# Patient Record
Sex: Female | Born: 1935 | Race: White | Hispanic: No | Smoking: Current every day smoker
Health system: Southern US, Community
[De-identification: ages and names within clinical notes are randomized; demographics above are authoritative.]

## PROBLEM LIST (undated history)

## (undated) DIAGNOSIS — D649 Anemia, unspecified: Secondary | ICD-10-CM

## (undated) DIAGNOSIS — Z951 Presence of aortocoronary bypass graft: Secondary | ICD-10-CM

## (undated) DIAGNOSIS — I714 Abdominal aortic aneurysm, without rupture, unspecified: Secondary | ICD-10-CM

## (undated) DIAGNOSIS — N186 End stage renal disease: Secondary | ICD-10-CM

## (undated) DIAGNOSIS — I639 Cerebral infarction, unspecified: Secondary | ICD-10-CM

## (undated) DIAGNOSIS — Z950 Presence of cardiac pacemaker: Secondary | ICD-10-CM

## (undated) DIAGNOSIS — E039 Hypothyroidism, unspecified: Secondary | ICD-10-CM

## (undated) DIAGNOSIS — J449 Chronic obstructive pulmonary disease, unspecified: Secondary | ICD-10-CM

## (undated) DIAGNOSIS — E785 Hyperlipidemia, unspecified: Secondary | ICD-10-CM

## (undated) DIAGNOSIS — Z992 Dependence on renal dialysis: Secondary | ICD-10-CM

## (undated) DIAGNOSIS — F329 Major depressive disorder, single episode, unspecified: Secondary | ICD-10-CM

## (undated) DIAGNOSIS — J189 Pneumonia, unspecified organism: Secondary | ICD-10-CM

## (undated) DIAGNOSIS — I4891 Unspecified atrial fibrillation: Secondary | ICD-10-CM

## (undated) DIAGNOSIS — F32A Depression, unspecified: Secondary | ICD-10-CM

## (undated) DIAGNOSIS — I509 Heart failure, unspecified: Secondary | ICD-10-CM

## (undated) DIAGNOSIS — I442 Atrioventricular block, complete: Secondary | ICD-10-CM

## (undated) DIAGNOSIS — I739 Peripheral vascular disease, unspecified: Secondary | ICD-10-CM

## (undated) DIAGNOSIS — I6529 Occlusion and stenosis of unspecified carotid artery: Secondary | ICD-10-CM

## (undated) DIAGNOSIS — I10 Essential (primary) hypertension: Secondary | ICD-10-CM

## (undated) HISTORY — DX: Essential (primary) hypertension: I10

## (undated) HISTORY — DX: Heart failure, unspecified: I50.9

## (undated) HISTORY — PX: CARDIAC CATHETERIZATION: SHX172

## (undated) HISTORY — DX: Peripheral vascular disease, unspecified: I73.9

## (undated) HISTORY — DX: Hyperlipidemia, unspecified: E78.5

## (undated) HISTORY — DX: Occlusion and stenosis of unspecified carotid artery: I65.29

---

## 1969-06-03 HISTORY — PX: HEMORRHOID SURGERY: SHX153

## 1974-06-03 HISTORY — PX: VAGINAL HYSTERECTOMY: SUR661

## 1989-02-01 HISTORY — PX: INSERT / REPLACE / REMOVE PACEMAKER: SUR710

## 1999-09-02 HISTORY — PX: CORONARY ANGIOPLASTY WITH STENT PLACEMENT: SHX49

## 1999-09-02 HISTORY — PX: PSEUDOANEURYSM REPAIR: SHX2272

## 1999-09-05 ENCOUNTER — Encounter: Payer: Self-pay | Admitting: Cardiology

## 1999-09-05 ENCOUNTER — Inpatient Hospital Stay (HOSPITAL_COMMUNITY): Admission: AD | Admit: 1999-09-05 | Discharge: 1999-09-13 | Payer: Self-pay | Admitting: Cardiology

## 1999-11-10 ENCOUNTER — Inpatient Hospital Stay (HOSPITAL_COMMUNITY): Admission: EM | Admit: 1999-11-10 | Discharge: 1999-11-12 | Payer: Self-pay | Admitting: Cardiology

## 1999-11-11 ENCOUNTER — Encounter: Payer: Self-pay | Admitting: Cardiology

## 2000-03-03 HISTORY — PX: DG AV DIALYSIS  SHUNT ACCESS EXIST*L* OR: HXRAD910

## 2000-03-20 ENCOUNTER — Inpatient Hospital Stay (HOSPITAL_COMMUNITY): Admission: AD | Admit: 2000-03-20 | Discharge: 2000-03-22 | Payer: Self-pay | Admitting: Nephrology

## 2000-03-21 ENCOUNTER — Encounter: Payer: Self-pay | Admitting: Nephrology

## 2000-03-27 ENCOUNTER — Encounter: Payer: Self-pay | Admitting: Vascular Surgery

## 2000-03-27 ENCOUNTER — Ambulatory Visit (HOSPITAL_COMMUNITY): Admission: RE | Admit: 2000-03-27 | Discharge: 2000-03-27 | Payer: Self-pay | Admitting: Vascular Surgery

## 2000-04-07 ENCOUNTER — Encounter: Payer: Self-pay | Admitting: Nephrology

## 2000-04-07 ENCOUNTER — Ambulatory Visit (HOSPITAL_COMMUNITY): Admission: RE | Admit: 2000-04-07 | Discharge: 2000-04-07 | Payer: Self-pay | Admitting: Nephrology

## 2002-06-03 HISTORY — PX: THYROID SURGERY: SHX805

## 2004-06-20 ENCOUNTER — Ambulatory Visit (HOSPITAL_COMMUNITY): Admission: RE | Admit: 2004-06-20 | Discharge: 2004-06-20 | Payer: Self-pay | Admitting: Nephrology

## 2005-06-03 DIAGNOSIS — I639 Cerebral infarction, unspecified: Secondary | ICD-10-CM

## 2005-06-03 HISTORY — DX: Cerebral infarction, unspecified: I63.9

## 2007-02-02 HISTORY — PX: CORONARY ARTERY BYPASS GRAFT: SHX141

## 2007-02-11 ENCOUNTER — Ambulatory Visit: Payer: Self-pay | Admitting: Cardiothoracic Surgery

## 2007-03-17 ENCOUNTER — Ambulatory Visit: Payer: Self-pay | Admitting: Cardiology

## 2007-03-17 ENCOUNTER — Inpatient Hospital Stay (HOSPITAL_COMMUNITY): Admission: AD | Admit: 2007-03-17 | Discharge: 2007-03-21 | Payer: Self-pay | Admitting: Nephrology

## 2007-03-18 ENCOUNTER — Encounter: Payer: Self-pay | Admitting: Gastroenterology

## 2007-03-24 ENCOUNTER — Ambulatory Visit: Payer: Self-pay | Admitting: Gastroenterology

## 2007-04-04 HISTORY — PX: AV FISTULA REPAIR: SHX563

## 2007-04-06 ENCOUNTER — Ambulatory Visit: Payer: Self-pay | Admitting: Thoracic Surgery (Cardiothoracic Vascular Surgery)

## 2007-04-11 ENCOUNTER — Ambulatory Visit: Payer: Self-pay | Admitting: Cardiology

## 2007-04-11 ENCOUNTER — Ambulatory Visit: Payer: Self-pay | Admitting: Vascular Surgery

## 2007-04-11 ENCOUNTER — Inpatient Hospital Stay (HOSPITAL_COMMUNITY): Admission: EM | Admit: 2007-04-11 | Discharge: 2007-04-16 | Payer: Self-pay | Admitting: Cardiology

## 2007-04-13 ENCOUNTER — Encounter: Payer: Self-pay | Admitting: Internal Medicine

## 2007-04-14 ENCOUNTER — Ambulatory Visit: Payer: Self-pay | Admitting: Vascular Surgery

## 2007-04-28 ENCOUNTER — Emergency Department (HOSPITAL_COMMUNITY): Admission: EM | Admit: 2007-04-28 | Discharge: 2007-04-29 | Payer: Self-pay | Admitting: Emergency Medicine

## 2007-05-06 ENCOUNTER — Ambulatory Visit: Payer: Self-pay | Admitting: Vascular Surgery

## 2007-05-11 ENCOUNTER — Ambulatory Visit: Payer: Self-pay | Admitting: Cardiology

## 2007-05-11 ENCOUNTER — Inpatient Hospital Stay (HOSPITAL_COMMUNITY): Admission: EM | Admit: 2007-05-11 | Discharge: 2007-05-13 | Payer: Self-pay | Admitting: Emergency Medicine

## 2008-07-21 ENCOUNTER — Inpatient Hospital Stay (HOSPITAL_COMMUNITY): Admission: EM | Admit: 2008-07-21 | Discharge: 2008-07-22 | Payer: Self-pay | Admitting: Cardiovascular Disease

## 2008-07-21 ENCOUNTER — Ambulatory Visit: Payer: Self-pay | Admitting: Cardiovascular Disease

## 2008-11-23 ENCOUNTER — Encounter (INDEPENDENT_AMBULATORY_CARE_PROVIDER_SITE_OTHER): Payer: Self-pay | Admitting: *Deleted

## 2009-02-21 ENCOUNTER — Encounter (INDEPENDENT_AMBULATORY_CARE_PROVIDER_SITE_OTHER): Payer: Self-pay | Admitting: *Deleted

## 2009-06-03 HISTORY — PX: OTHER SURGICAL HISTORY: SHX169

## 2009-06-06 ENCOUNTER — Ambulatory Visit: Payer: Self-pay | Admitting: Vascular Surgery

## 2009-06-27 ENCOUNTER — Ambulatory Visit: Payer: Self-pay | Admitting: Surgery

## 2009-06-27 ENCOUNTER — Inpatient Hospital Stay (HOSPITAL_COMMUNITY): Admission: AD | Admit: 2009-06-27 | Discharge: 2009-06-30 | Payer: Self-pay | Admitting: Surgery

## 2009-07-28 ENCOUNTER — Ambulatory Visit: Payer: Self-pay | Admitting: Vascular Surgery

## 2009-11-28 ENCOUNTER — Telehealth: Payer: Self-pay | Admitting: Cardiovascular Disease

## 2010-04-11 ENCOUNTER — Ambulatory Visit: Payer: Self-pay | Admitting: Surgery

## 2010-06-24 ENCOUNTER — Encounter: Payer: Self-pay | Admitting: Surgery

## 2010-07-03 NOTE — Progress Notes (Signed)
Summary: pt is following Dr. Norman Herrlich  Phone Note Outgoing Call Call back at Home Phone [phone]   Summary of Call: CMA s/w pt to ask if she was going to follow seeing Dr. Clifton James or was she going to follow Dr. Norman Herrlich who is listed as her Primary Cardiologist..She states she is going to follow w/ Dr. Dulce Sellar. I then advised that her meds would need to be filled w/Dr. Dulce Sellar from now on. Pt gave verbal understanding. I advised that I would call pharmacy and make aware of all meds to be sent to Dr. Dulce Sellar from now on and not Dr. Clifton James. Danielle Rankin, CMA  November 28, 2009 12:06 PM

## 2010-07-12 ENCOUNTER — Ambulatory Visit (INDEPENDENT_AMBULATORY_CARE_PROVIDER_SITE_OTHER): Payer: Medicare Other | Admitting: Vascular Surgery

## 2010-07-12 DIAGNOSIS — N186 End stage renal disease: Secondary | ICD-10-CM

## 2010-07-13 NOTE — Assessment & Plan Note (Signed)
OFFICE VISIT  Maria, LECKEY [name] DOB:  December 10, 1932                                       07/12/2010 ZOXWR#:[id number]  CHIEF COMPLAINT:  Numbness in arm.  HISTORY OF PRESENT ILLNESS:  The patient is a 76 year old female referred by Dr. Hyman Hopes for evaluation for possible ischemic steal from her AV fistula.  She had a left upper arm AV fistula placed in November of 2001.  This was then revised in 2008 and was revised by replacing an aneurysmal segment with PTFE.  She apparently had some numbness and tingling in the upper arm around the area of the fistula recently.  This has since resolved.  She denies any numbness or tingling in her left hand.  She has had no ulceration or skin loss in her left hand.  She states that the symptoms at this point have essentially resolved and were primarily upper arm in character.  Of note, she had not received the results of ABIs performed in our office in November of 2011.  This was done for followup of bilateral common iliac stents performed by Dr. Myra Gianotti in January of 2011.  I reviewed those ABI findings with her today which were triphasic bilaterally with an ABI of greater than 1 bilaterally.  PHYSICAL EXAM:  Today blood pressure is 137/73 in the right arm, heart rate is 82 and regular, respirations 20.  She has an easily palpable thrill in the left upper arm AV fistula.  She has a 1+ left radial pulse.  Chest:  Clear to auscultation.  Cardiac:  Regular rate and rhythm.  Abdomen:  Soft with a tender area in the epigastrium with a small nodule in this location.  This is at an old port site which she thinks was from previous cholecystectomy.  The mass is nonreducible.  REVIEW OF SYSTEMS:  Today she denies any shortness of breath.  She denies any chest pain.  ASSESSMENT:  Left arm pain of unknown etiology but doubt ischemic steal. She does not really have distal symptoms in her hand and her symptoms currently have resolved.  I  would favor conservative management with continued observation.  If her symptoms worsen again over time we would be happy to reevaluate at some point in the future.  I discussed all the signs and symptoms of ischemic steal with the patient today so she knows what to look for.  As well as her iliac stents are concerned these seem to be patent by clinical exam as well as recent ABIs.  She will continue to follow up with Dr. Myra Gianotti if she has any problems concerning claudication in the future.  She has a nodule on her upper abdomen which I believe probably is consistent with an incarcerated hernia from previous laparoscopic cholecystectomy.  I have discussed with her that if this continues to cause her problems over time she could see one of the general surgeons for further evaluation of this.  She will follow up with me on an as- needed basis.    Janetta Hora. Fields, MD Electronically Signed  CEF/MEDQ  D:  07/13/2010  T:  07/13/2010  Job:  [id number]  cc:   Garnetta Buddy, [name].D.

## 2010-08-09 ENCOUNTER — Institutional Professional Consult (permissible substitution) (INDEPENDENT_AMBULATORY_CARE_PROVIDER_SITE_OTHER): Payer: Medicare Other | Admitting: Cardiovascular Disease

## 2010-08-09 DIAGNOSIS — E7800 Pure hypercholesterolemia, unspecified: Secondary | ICD-10-CM

## 2010-08-09 DIAGNOSIS — I4891 Unspecified atrial fibrillation: Secondary | ICD-10-CM

## 2010-08-09 DIAGNOSIS — I5022 Chronic systolic (congestive) heart failure: Secondary | ICD-10-CM

## 2010-08-09 DIAGNOSIS — I2510 Atherosclerotic heart disease of native coronary artery without angina pectoris: Secondary | ICD-10-CM

## 2010-08-14 ENCOUNTER — Telehealth: Payer: Self-pay | Admitting: Cardiovascular Disease

## 2010-08-19 LAB — BASIC METABOLIC PANEL
BUN: 20 mg/dL (ref 6–23)
BUN: 37 mg/dL — ABNORMAL HIGH (ref 6–23)
CO2: 25 mEq/L (ref 19–32)
GFR calc Af Amer: 10 mL/min — ABNORMAL LOW (ref >60)
GFR calc Af Amer: 7 mL/min — ABNORMAL LOW (ref >60)
GFR calc non Af Amer: 6 mL/min — ABNORMAL LOW (ref >60)
Glucose, Bld: 92 mg/dL (ref 70–99)
Glucose, Bld: 98 mg/dL (ref 70–99)
Potassium: 4 mEq/L (ref 3.5–5.1)

## 2010-08-19 LAB — CBC
HCT: 25.4 % — ABNORMAL LOW (ref 36.0–46.0)
HCT: 29 % — ABNORMAL LOW (ref 36.0–46.0)
HCT: 29.6 % — ABNORMAL LOW (ref 36.0–46.0)
HCT: 30.1 % — ABNORMAL LOW (ref 36.0–46.0)
Hemoglobin: 10 g/dL — ABNORMAL LOW (ref 12.0–15.0)
Hemoglobin: 8.6 g/dL — ABNORMAL LOW (ref 12.0–15.0)
Hemoglobin: 9.7 g/dL — ABNORMAL LOW (ref 12.0–15.0)
MCHC: 32.9 g/dL (ref 30.0–36.0)
MCHC: 33.2 g/dL (ref 30.0–36.0)
MCHC: 33.3 g/dL (ref 30.0–36.0)
MCHC: 34 g/dL (ref 30.0–36.0)
MCV: 110 fL — ABNORMAL HIGH (ref 78.0–100.0)
MCV: 111.3 fL — ABNORMAL HIGH (ref 78.0–100.0)
Platelets: 142 10*3/uL — ABNORMAL LOW (ref 150–400)
Platelets: 174 10*3/uL (ref 150–400)
RBC: 2.24 MIL/uL — ABNORMAL LOW (ref 3.87–5.11)
RBC: 2.28 MIL/uL — ABNORMAL LOW (ref 3.87–5.11)
RDW: 16.8 % — ABNORMAL HIGH (ref 11.5–15.5)
RDW: 17 % — ABNORMAL HIGH (ref 11.5–15.5)
RDW: 17 % — ABNORMAL HIGH (ref 11.5–15.5)
RDW: 17.2 % — ABNORMAL HIGH (ref 11.5–15.5)
WBC: 5.1 10*3/uL (ref 4.0–10.5)
WBC: 5.2 10*3/uL (ref 4.0–10.5)
WBC: 5.4 10*3/uL (ref 4.0–10.5)
WBC: 8.8 10*3/uL (ref 4.0–10.5)

## 2010-08-19 LAB — POCT I-STAT, CHEM 8
Calcium, Ion: 1.22 mmol/L (ref 1.12–1.32)
Chloride: 105 mEq/L (ref 96–112)
Creatinine, Ser: 5 mg/dL — ABNORMAL HIGH (ref 0.4–1.2)
Creatinine, Ser: 5.3 mg/dL — ABNORMAL HIGH (ref 0.4–1.2)
Hemoglobin: 12.2 g/dL (ref 12.0–15.0)
TCO2: 29 mmol/L (ref 0–100)

## 2010-08-19 LAB — TYPE AND SCREEN: ABO/RH(D): O POS

## 2010-08-19 LAB — DIFFERENTIAL
Basophils Absolute: 0 10*3/uL (ref 0.0–0.1)
Basophils Relative: 0 % (ref 0–1)
Eosinophils Absolute: 0.1 10*3/uL (ref 0.0–0.7)
Eosinophils Relative: 0 % (ref 0–5)
Eosinophils Relative: 2 % (ref 0–5)
Lymphocytes Relative: 16 % (ref 12–46)
Lymphs Abs: 0.8 10*3/uL (ref 0.7–4.0)
Lymphs Abs: 1.3 10*3/uL (ref 0.7–4.0)
Monocytes Absolute: 0.2 10*3/uL (ref 0.1–1.0)
Neutro Abs: 4.1 10*3/uL (ref 1.7–7.7)
Neutro Abs: 4.9 10*3/uL (ref 1.7–7.7)
Neutrophils Relative %: 68 % (ref 43–77)

## 2010-08-19 LAB — RENAL FUNCTION PANEL
Calcium: 9.7 mg/dL (ref 8.4–10.5)
Chloride: 97 mEq/L (ref 96–112)
GFR calc Af Amer: 8 mL/min — ABNORMAL LOW (ref >60)
GFR calc non Af Amer: 6 mL/min — ABNORMAL LOW (ref >60)
Phosphorus: 5.7 mg/dL — ABNORMAL HIGH (ref 2.3–4.6)
Potassium: 5 mEq/L (ref 3.5–5.1)

## 2010-08-19 LAB — HEPATITIS B SURFACE ANTIGEN: Hepatitis B Surface Ag: NEGATIVE

## 2010-08-22 ENCOUNTER — Inpatient Hospital Stay (HOSPITAL_BASED_OUTPATIENT_CLINIC_OR_DEPARTMENT_OTHER)
Admission: RE | Admit: 2010-08-22 | Discharge: 2010-08-22 | Disposition: A | Discharge status: Hospice | Payer: Medicare Other | Source: Ambulatory Visit | Attending: Cardiovascular Disease | Admitting: Cardiovascular Disease

## 2010-08-22 ENCOUNTER — Observation Stay (HOSPITAL_COMMUNITY)
Admission: AD | Admit: 2010-08-22 | Discharge: 2010-08-23 | Disposition: A | Discharge status: Home / Self Care | Payer: Medicare Other | Source: Ambulatory Visit | Attending: Cardiovascular Disease | Admitting: Cardiovascular Disease

## 2010-08-22 DIAGNOSIS — J449 Chronic obstructive pulmonary disease, unspecified: Secondary | ICD-10-CM | POA: Insufficient documentation

## 2010-08-22 DIAGNOSIS — I2589 Other forms of chronic ischemic heart disease: Secondary | ICD-10-CM | POA: Insufficient documentation

## 2010-08-22 DIAGNOSIS — I120 Hypertensive chronic kidney disease with stage 5 chronic kidney disease or end stage renal disease: Secondary | ICD-10-CM | POA: Insufficient documentation

## 2010-08-22 DIAGNOSIS — N186 End stage renal disease: Secondary | ICD-10-CM | POA: Insufficient documentation

## 2010-08-22 DIAGNOSIS — Z992 Dependence on renal dialysis: Secondary | ICD-10-CM | POA: Insufficient documentation

## 2010-08-22 DIAGNOSIS — I509 Heart failure, unspecified: Secondary | ICD-10-CM | POA: Insufficient documentation

## 2010-08-22 DIAGNOSIS — I2510 Atherosclerotic heart disease of native coronary artery without angina pectoris: Secondary | ICD-10-CM | POA: Insufficient documentation

## 2010-08-22 DIAGNOSIS — E785 Hyperlipidemia, unspecified: Secondary | ICD-10-CM | POA: Insufficient documentation

## 2010-08-22 DIAGNOSIS — R0609 Other forms of dyspnea: Secondary | ICD-10-CM | POA: Insufficient documentation

## 2010-08-22 DIAGNOSIS — R220 Localized swelling, mass and lump, head: Principal | ICD-10-CM | POA: Insufficient documentation

## 2010-08-22 DIAGNOSIS — Y9210 Unspecified residential institution as the place of occurrence of the external cause: Secondary | ICD-10-CM | POA: Insufficient documentation

## 2010-08-22 DIAGNOSIS — J4489 Other specified chronic obstructive pulmonary disease: Secondary | ICD-10-CM | POA: Insufficient documentation

## 2010-08-22 DIAGNOSIS — R0989 Other specified symptoms and signs involving the circulatory and respiratory systems: Secondary | ICD-10-CM | POA: Insufficient documentation

## 2010-08-22 DIAGNOSIS — Z951 Presence of aortocoronary bypass graft: Secondary | ICD-10-CM | POA: Insufficient documentation

## 2010-08-22 DIAGNOSIS — I059 Rheumatic mitral valve disease, unspecified: Secondary | ICD-10-CM | POA: Insufficient documentation

## 2010-08-22 DIAGNOSIS — I5020 Unspecified systolic (congestive) heart failure: Secondary | ICD-10-CM | POA: Insufficient documentation

## 2010-08-22 DIAGNOSIS — T50995A Adverse effect of other drugs, medicaments and biological substances, initial encounter: Secondary | ICD-10-CM | POA: Insufficient documentation

## 2010-08-22 DIAGNOSIS — Z950 Presence of cardiac pacemaker: Secondary | ICD-10-CM | POA: Insufficient documentation

## 2010-08-22 DIAGNOSIS — R229 Localized swelling, mass and lump, unspecified: Secondary | ICD-10-CM | POA: Insufficient documentation

## 2010-08-22 DIAGNOSIS — I739 Peripheral vascular disease, unspecified: Secondary | ICD-10-CM | POA: Insufficient documentation

## 2010-08-22 LAB — COMPREHENSIVE METABOLIC PANEL
ALT: 22 U/L (ref 0–35)
Albumin: 3.4 g/dL — ABNORMAL LOW (ref 3.5–5.2)
Alkaline Phosphatase: 47 U/L (ref 39–117)
BUN: 37 mg/dL — ABNORMAL HIGH (ref 6–23)
Chloride: 98 mEq/L (ref 96–112)
Potassium: 4.7 mEq/L (ref 3.5–5.1)
Sodium: 132 mEq/L — ABNORMAL LOW (ref 135–145)
Total Bilirubin: 0.4 mg/dL (ref 0.3–1.2)
Total Protein: 5.8 g/dL — ABNORMAL LOW (ref 6.0–8.3)

## 2010-08-22 LAB — POCT I-STAT 3, VENOUS BLOOD GAS (G3P V)
Bicarbonate: 21.4 mEq/L (ref 20.0–24.0)
O2 Saturation: 55 %
TCO2: 23 mmol/L (ref 0–100)
pH, Ven: 7.335 — ABNORMAL HIGH (ref 7.250–7.300)
pO2, Ven: 31 mmHg (ref 30.0–45.0)

## 2010-08-22 LAB — POCT I-STAT 3, ART BLOOD GAS (G3+)
Acid-base deficit: 1 mmol/L (ref 0.0–2.0)
Acid-base deficit: 1 mmol/L (ref 0.0–2.0)
O2 Saturation: 81 %
O2 Saturation: 83 %
O2 Saturation: 83 %
TCO2: 25 mmol/L (ref 0–100)
TCO2: 25 mmol/L (ref 0–100)
pCO2 arterial: 38.5 mmHg (ref 35.0–45.0)
pH, Arterial: 7.401 — ABNORMAL HIGH (ref 7.350–7.400)

## 2010-08-22 LAB — CBC
HCT: 30.3 % — ABNORMAL LOW (ref 36.0–46.0)
MCV: 105.6 fL — ABNORMAL HIGH (ref 78.0–100.0)
Platelets: 131 10*3/uL — ABNORMAL LOW (ref 150–400)
RBC: 2.87 MIL/uL — ABNORMAL LOW (ref 3.87–5.11)
WBC: 7.5 10*3/uL (ref 4.0–10.5)

## 2010-08-23 ENCOUNTER — Observation Stay (HOSPITAL_COMMUNITY): Payer: Medicare Other

## 2010-08-23 DIAGNOSIS — I2510 Atherosclerotic heart disease of native coronary artery without angina pectoris: Secondary | ICD-10-CM

## 2010-08-24 NOTE — Letter (Signed)
August 09, 2010   Donnel Saxon [address] [place] [address]  RE:  [name], [name] MRN:  [id number]  /  DOB:  02/22/1933  Dear Dr. Ardelle Park:  Thank you for referring [name] for further cardiac evaluation and consideration of cardiac catheterization.  As you are aware, this is a pleasant 77 year old with extensive medical problems that include the following: 1. Coronary artery disease status post myocardial infarction.  She is     status post three-vessel coronary artery bypass graft surgery in     2008.  Most recent cardiac catheterization was done in February     2010 at [facility].  It showed severe underlying three-     vessel coronary artery disease with patent SVG to LAD, SVG to PDA     and SVG to OM.  Ejection fraction was 45%. 2. Congestive heart failure.  Previous ejection fraction was 45%.     However, most recent ejection fraction was 25% by echocardiogram. 3. End-stage renal disease on hemodialysis. 4. Previous history of stroke. 5. Hypertension. 6. Hyperlipidemia. 7. Bilateral carotid artery disease. 8. Chronic obstructive pulmonary disease. 9. Peripheral arterial disease status post iliac stenting done by Dr.     Arbie Cookey. 10.Questionable history of atrial fibrillation. 11.Status post permanent pacemaker placement.  HISTORY OF PRESENT ILLNESS:  [name] is referred today due to abnormal echocardiogram which showed an ejection fraction of 25%.  Her previous ejection fraction was 45%.  She is complaining of progressive dyspnea with minimal activities as well as having no energy.  She feels fatigued all the time and she is not able to perform many of her regular activities.  She also complains of lower extremity weakness with walking.  According to her this was checked at the vascular clinic with recent ABI and was told that circulation was reasonable.  She denies any chest pain.  There have been no palpitations, syncope or presyncope. The patient  usually follows up with Dr. Dulce Sellar but did not want to have the catheterization done at [facility] and thus she preferred to come and see [place] here in clinic.  MEDICATIONS: 1. Aspirin 81 mg once daily. 2. Colace 100 mg twice daily. 3. Lasix 20 mg once daily. 4. Levothyroxine 150 mcg once daily. 5. Lipitor 80 mg at bedtime. 6. Nephron vitamin once daily. 7. Omeprazole 20 mg once daily. 8. Sensipar 30 mg once daily. 9. Plavix 75 mg once daily.  ALLERGIES:  Include IV dye with unknown reaction.  SOCIAL HISTORY:  She quit smoking in 2008.  She used to smoke half a pack per day for many years.  She denies any alcohol or recreational drug use.  The patient is widowed and has 4 children.  PAST SURGICAL HISTORY: 1. Coronary artery bypass graft surgery. 2. Thyroid surgery. 3. Hysterectomy. 4. Hemorrhoid surgery. 5. Dialysis shunt in the left arm. 6. Peripheral angioplasty seems to be to the iliac arteries.  FAMILY HISTORY:  Negative for premature coronary artery disease.  REVIEW OF SYSTEMS:  This is remarkable for increased dyspnea and generalized fatigue.  There is no chest pain.  She does have occasional heartburn.  There is anxiety and depression.  A full review of system was performed and is otherwise negative.  PHYSICAL EXAMINATION:  GENERAL:  The patient appears to be at her stated age in no acute distress. VITAL SIGNS:  Weight is 120.8 pounds, blood pressure is 135/73, pulse is 69, oxygen saturation is 95% on room air. HEENT:  Normocephalic, atraumatic. NECK :  There is no JVD.  There is bruit on the left carotid side but this seems to be transmitted from her left arm fistula. RESPIRATORY:  Normal respiratory effort with no use of accessory muscles.  Auscultation reveals normal breath sounds. Cardiovascular:  PMI is laterally displaced.  Normal S1, S2 with no gallops.  There is a 3/6 systolic ejection murmur at the aortic area which is mid  peaking. ABDOMEN:  Benign, nontender, nondistended. EXTREMITIES:  With no clubbing or cyanosis.  There is trace edema bilaterally. SKIN:  Warm and dry with no rash. PSYCHIATRIC:  She is alert and oriented x3 with normal mood and affect. VASCULAR:  Her femoral pulses are normal bilaterally.  LABORATORY DATA:  An electrocardiogram was performed which showed a ventricular paced rhythm.  I do not see any atrial activity and likely she is in atrial fibrillation. Recent echo report was reviewed. EF 25%, mild MR /TR. Mild aortic stenosis.  IMPRESSION: 1. Congestive heart failure with severely reduced LV systolic     function.  Seems to be chronic.  However, there has been a     significant drop in her ejection fraction.  Her symptoms include     dyspnea with minimal activities as well as generalized fatigue and     lower extremity weakness.  I think it is important to investigate     the reason behind deterioration in her ejection fraction.  The most     common would be progression of ischemic heart disease.  Thus, I     recommend proceeding with cardiac catheterization and possible     coronary intervention for further evaluation.  We will also perform     a right heart catheterization at the same time.  Risks, benefits     and alternatives were discussed with the patient.  We will give her     prednisone for dye allergy.  We will also go ahead and start her on     small dose carvedilol 3.125 mg twice daily as well as lisinopril     2.5 mg once daily.  All side effects were explained.  The other     possibility could be progressive deterioration in ejection fraction     due to continuously being paced in the right ventricle.  We will     have to consider in the future upgraded to biventricular pacemaker     with the possible addition of a defibrillator.  However, I would be     hesitant to go that route given that she is on dialysis and likely     her life expectancy might not warrant  this aggressive approach.  We     will have to see how she responds to medical therapy. 2. Coronary artery disease:  Will be evaluated by cardiac     catheterization.  In the meantime we will continue with daily     aspirin and Lipitor.  We will start treatment for heart failure. 3. Possible atrial fibrillation:  The patient is on aspirin and     Plavix.  She was told in the past that she should not be on     Coumadin.  I am not sure the reason behind that.  It seems that she     was on warfarin in the past and was switched later to Plavix.  Thank you for allowing me to participate in the care of your patient.  Sincerely yours,   Sincerely,  Lorine Bears, MD Electronically Signed   MA/MedQ  DD: 08/09/2010  DT: 08/09/2010  Job #: [id number]

## 2010-08-27 ENCOUNTER — Encounter: Payer: Self-pay | Admitting: Cardiovascular Disease

## 2010-08-30 NOTE — Progress Notes (Signed)
Summary: c/o side effect from meds/f/u  Phone Note Call from Patient Call back at Home Phone [phone]   Caller: Patient Reason for Call: Talk to Nurse Summary of Call: c/o side effect from meds. dry mouth. throat.  Initial call taken by: Lorne Skeens,  August 14, 2010 9:45 AM  Follow-up for Phone Call        Spoke to patient-c/o severe dry mouth with Lisinopril.  Can not tolerate it.  Advised to stop medicine and will get back with patient after Dr. Kirke Corin returns next week. Follow-up by: Dessie Coma  LPN,  August 14, 2010 10:19 AM

## 2010-09-03 ENCOUNTER — Encounter: Payer: Self-pay | Admitting: Cardiovascular Disease

## 2010-09-03 ENCOUNTER — Ambulatory Visit (INDEPENDENT_AMBULATORY_CARE_PROVIDER_SITE_OTHER): Payer: Medicare Other | Admitting: Cardiovascular Disease

## 2010-09-03 DIAGNOSIS — E785 Hyperlipidemia, unspecified: Secondary | ICD-10-CM | POA: Insufficient documentation

## 2010-09-03 DIAGNOSIS — I2510 Atherosclerotic heart disease of native coronary artery without angina pectoris: Secondary | ICD-10-CM

## 2010-09-03 DIAGNOSIS — I10 Essential (primary) hypertension: Secondary | ICD-10-CM

## 2010-09-03 DIAGNOSIS — I509 Heart failure, unspecified: Secondary | ICD-10-CM | POA: Insufficient documentation

## 2010-09-03 NOTE — Progress Notes (Signed)
HPI  This is follow up visit after a recent cardiac cath which was done due to significant drop in ejection fraction and increased symptoms of dyspnea. The catheterization showed patent grafts. Right heart cath showed only mildly elevated filling pressures with no evidence of pulmonary hypertension. She did have a delayed contrast reaction in recovery area with lip and face swelling in spite of being pretreated with steroids. This was treated with Solumedrol, Benadryl and Cimetidine. She was admitted for observation and had dialysis next day without complications. Her dose of Coreg was doubled before discharge. She is now doing reasonably well.   Allergies  Allergen Reactions  . Lisinopril Swelling  . Iohexol      Desc: itching, swelling      Current Outpatient Prescriptions on File Prior to Visit  Medication Sig Dispense Refill  . aspirin 81 MG tablet Take 81 mg by mouth daily.        Marland Kitchen atorvastatin (LIPITOR) 80 MG tablet Take 80 mg by mouth at bedtime.        Marland Kitchen b complex-vitamin c-folic acid (NEPHRO-VITE) 0.8 MG TABS Take 0.8 mg by mouth at bedtime.        . cinacalcet (SENSIPAR) 30 MG tablet Take 60 mg by mouth daily.       . clopidogrel (PLAVIX) 75 MG tablet Take 75 mg by mouth daily.        Marland Kitchen docusate sodium (COLACE) 100 MG capsule Take 100 mg by mouth 2 (two) times daily.        . furosemide (LASIX) 20 MG tablet Take 80 mg by mouth 2 (two) times daily.       . Levothyroxine Sodium 150 MCG CAPS Take 1 capsule by mouth daily.        Marland Kitchen DISCONTD: omeprazole (PRILOSEC) 20 MG capsule Take 20 mg by mouth daily.           Past Medical History  Diagnosis Date  . History of stroke   . Carotid artery occlusion     Bilateral carotid artery disease  . COPD (chronic obstructive pulmonary disease)   . Peripheral artery disease   . Arrhythmia     ? hx of atrial fibrillation  . Anxiety and depression   . Lightheadedness   . SOB (shortness of breath)   . CHF (congestive heart failure)    EF 25-30%.   . Chronic kidney disease     End-stage on hemodialysis  . Coronary artery disease 2008    post MI  . Hypertension   . Hyperlipidemia      Past Surgical History  Procedure Date  . Coronary artery bypass graft 2008    three-vessel  . Insert / replace / remove pacemaker   . Thyroid surgery 2004  . Abdominal hysterectomy 1976  . Hemorroid surgery     at age 55  . Peripheral angioplasty   . Dg av dialysis  shunt access exist*l* or     left arm  . Cardiac catheterization 02/10, 03/12    Most recent showed 3 vessel CAD with patent grafts: SVG to LAD, SVG to PDA and SVG to OM     No family history on file.   History   Social History  . Marital Status: Widowed    Spouse Name: N/A    Number of Children: N/A  . Years of Education: N/A   Occupational History  . Not on file.   Social History Main Topics  . Smoking status: Former Smoker --  0.5 packs/day    Types: Cigarettes    Quit date: 06/03/2006  . Smokeless tobacco: Not on file  . Alcohol Use: No  . Drug Use: No  . Sexually Active:    Other Topics Concern  . Not on file   Social History Narrative  . No narrative on file      PHYSICAL EXAM   BP 120/74  Pulse 71  Wt 119 lb 6.4 oz (54.159 kg)  SpO2 95%  Constitutional: She is oriented to person, place, and time. She appears well-developed and well-nourished. No distress.  HENT: No nasal discharge.  Head: Normocephalic and atraumatic.  Eyes: Pupils are equal, round, and reactive to light. Right eye exhibits no discharge. Left eye exhibits no discharge.  Neck: Normal range of motion. Neck supple. No JVD present. No thyromegaly present.  Cardiovascular: Normal rate, regular rhythm, normal heart sounds and intact distal pulses. Exam reveals no gallop and no friction rub.  Pulmonary/Chest: Effort normal and breath sounds normal. No stridor. No respiratory distress. She has no wheezes. She has no rales. She exhibits no tenderness.  Abdominal: Soft.  Bowel sounds are normal. She exhibits no distension. There is no tenderness. There is no rebound and no guarding.  Musculoskeletal: Normal range of motion. She exhibits no edema and no tenderness.  Neurological: She is alert and oriented to person, place, and time. Coordination normal.  Skin: Skin is warm and dry. No rash noted. She is not diaphoretic. No erythema. No pallor.  Psychiatric: She has a normal mood and affect. Her behavior is normal. Judgment and thought content normal.     ASSESSMENT AND PLAN

## 2010-09-05 ENCOUNTER — Encounter: Payer: Self-pay | Admitting: Cardiovascular Disease

## 2010-09-05 NOTE — Assessment & Plan Note (Signed)
Will continue treatment with high dose Atorvastatin. Goal LDL <70.

## 2010-09-05 NOTE — Discharge Summary (Signed)
[name], [name]                  ACCOUNT NO.:  [id number]  MEDICAL RECORD NO.:  [id number]           PATIENT TYPE:  O  LOCATION:  6733                         FACILITY:  [facility]  PHYSICIAN:  Lorine Bears, MD     DATE OF BIRTH:  16-Oct-1932  DATE OF ADMISSION:  08/22/2010 DATE OF DISCHARGE:  08/23/2010                              DISCHARGE SUMMARY   PRIMARY CARDIOLOGIST:  Lorine Bears, MD  PRIMARY CARE PHYSICIAN:  Donnel Saxon, MD  DISCHARGE DIAGNOSES: 1. Coronary artery disease, stable.     a.     Right and left cardiac catheterization, August 22, 2010:      Severe native triple-vessel coronary artery disease with patent      grafts.  Severely reduced left ventricular systolic function.      Mildly elevated filling pressures with no significant pulmonary      hypertension. I recommend medical therapy for coronary artery disease, congestive heart failure, and cardiomyopathy.  We will consider upgrading her pacemaker to an implantable cardioverter-defibrillator CRT device based on her clinical response. 1. Delayed contrast reaction.     a.     Resolved after medical therapy morning of discharge.  SECONDARY DIAGNOSES: 1. Systolic congestive heart failure.     a.     Prior left ventricular ejection fraction 45%, most recently      25% per echocardiogram. 2. End-stage renal disease, hemodialysis. 3. History of cerebrovascular accident. 4. Hypertension. 5. Hyperlipidemia. 6. Bilateral carotid artery disease. 7. Chronic obstructive pulmonary disease. 8. Peripheral arterial disease.     a.     Status post iliac stenting, Dr. Arbie Cookey. 9. Questionable history of atrial fibrillation. 10.Status post Permanent pacemaker.  ALLERGIES AND INTOLERANCES:  CONTRAST MEDIA (facial swelling).  PROCEDURES:  Right and left cardiac catheterization, August 22, 2010: please see discharge diagnoses #1 subsection A.  HISTORY OF PRESENT ILLNESS:  [name] is a 77 year old Caucasian female with  a known history of coronary artery disease, S/P CABG in 2008, known ICM with previous LVEF of 45%, ESRD, hypertension, and peripheral artery disease.  She presented with symptoms of dyspnea and repeat echocardiogram showed decrease in LV function to LVEF of 25%.  Due to her symptoms and unexplained decreased LVEF, she was set up for right and left diagnostic cardiac catheterization to rule out significant worsening of CAD and evaluate her right heart pressures.  The risks and benefits were discussed in detail and the patient agreed to proceed. The patient presented for that scheduled procedure on August 22, 2010.  HOSPITAL COURSE:  The patient was admitted and underwent procedures as described above.  She tolerated them well but did have delayed reaction to contrast dye in spite of being pretreated with prednisone.  Her reaction included lip swelling which progressed to facial swelling.  She was treated with 120 mg of IV Solu-Medrol, Benadryl 50 mg, and Pepcid. The patient's symptoms improved but did not resolve entirely and she was kept overnight for observation.  By the morning of August 23, 2010, her facial swelling had resolved.  She was scheduled for dialysis that day and this was  able to be completed in the hospital due to the assistance of Nephrology.  The patient has been set up for a followup appointment to see Dr. Lorine Bears in the [facility] [place] office on September 06, 2010, at 10:45 a.m. for close follow up with her medical therapy and post cath evaluation.  The patient will follow up with her other physicians as previously scheduled.  Strangely, the patient appears to have previously been on both Coreg 3.125 p.o. b.i.d. and Toprol, however, this was not continued in the hospital.  Rather she was continued on Coreg only.  In an effort to be aggressive as to her medical management and to be prudent in regard to her beta-blockade therapy, her metoprolol will be  discontinued and her Coreg will be increased to 6.25 mg p.o. b.i.d.  Otherwise, no medication changes have been made at this time.  This will be evaluated by Dr. Kirke Corin at the follow up appointment.  At the time of discharge, the patient received her new medication list, prescription for increased dose of Coreg, follow up instructions, and postcath instructions.  All her questions and concerns will be addressed prior to her leaving the hospital.  DISCHARGE LABORATORY DATA:  PH 7.401, PCO2 of 38.5, PO2 of 47.0, pH venous 7.335, PCO2 venous 14.2, PO2 venous 31.0, bicarbonate 23.9, TCO2 of 25, base deficit 1.0, oxygen saturation 83.0.  WBC 7.50, HGB 10.0, HCT 30.3, and PLT count is 131.  Sodium 132, potassium 4.7, chloride 98, bicarb 20, BUN 37, creatinine 6.18, and glucose 201.  Total bilirubin 0.4, alkaline phosphatase 47, AST 24, ALT 22, total protein 5.8, albumin 3.4, and calcium 8.4.  FOLLOWUP PLANS AND APPOINTMENTS:  Dr. Lorine Bears at [facility], [place] office, September 06, 2010, at 10:45 a.m.  DISCHARGE MEDICATIONS: 1. Coreg 6.25 mg p.o. b.i.d. 2. Enteric-coated aspirin 81 mg p.o. daily. 3. Colace 100 mg 1 capsule p.o. b.i.d. 4. Folic acid 0.4 mg p.o. b.i.d. 5. Furosemide 80 mg 1 tablet p.o. b.i.d. 6. Isosorbide dinitrate 20 mg 2 tablets p.o. t.i.d. 7. Levothyroxine 150 mcg 1 tablet p.o. q.a.m. 8. Lunesta 3 mg 1 tablet p.o. at bedtime p.r.n. 9. Lipitor 80 mg 1 tablet p.o. at bedtime. 10.Nepro vitamin 1 tablet p.o. q.a.m. 11.Sublingual nitroglycerin 0.4 mg 1 tablet q.5 minutes up to 3 doses     p.r.n. for chest discomfort. 12.Prilosec 20 mg 1 capsule p.o. daily. 13.PhosLo 667 mg 5 capsules p.o. t.i.d. with meals. 14.Plavix 75 mg 1 tablet p.o. at bedtime. 15.Spiriva 18 mcg 1 capsule inhaled at bedtime. 16.Sensipar 60 mg 1 tablet p.o. daily.  DURATION OF DISCHARGE ENCOUNTER INCLUDING PHYSICIAN TIME:  35 minutes.     Jarrett Ables,  PAC   ______________________________ Lorine Bears, MD    MS/MEDQ  D:  08/23/2010  T:  08/24/2010  Job:  [id number]  cc:   Donnel Saxon  Electronically Signed by Jarrett Ables PAC on 09/01/2010 02:47:28 PM Electronically Signed by Lorine Bears MD on 09/05/2010 03:22:25 PM

## 2010-09-05 NOTE — Procedures (Signed)
NAME:  [name], [name]                  ACCOUNT NO.:  [id number]  MEDICAL RECORD NO.:  [id number]           PATIENT TYPE:  O  LOCATION:  6733                         FACILITY:  [facility]  PHYSICIAN:  Lorine Bears, MD     DATE OF BIRTH:  03-06-1933  DATE OF PROCEDURE: DATE OF DISCHARGE:                           CARDIAC CATHETERIZATION   PRIMARY CARE PHYSICIAN:  Dr. Donnel Saxon in [place].  PROCEDURES PERFORMED: 1. Left heart catheterization. 2. Right heart catheterization. 3. Coronary angiography. 4. SVG angiography. 5. Left ventricular angiography.  INDICATIONS AND CLINICAL HISTORY:  [name] is a 77 year old female with known history of coronary artery disease status post coronary artery bypass graft surgery in 2008, known ischemic cardiomyopathy with previous ejection fraction of 45%, end-stage renal disease, hypertension, and peripheral arterial disease.  She presented with symptoms of dyspnea.  Outpatient echocardiogram showed a drop in her ejection fraction to 25%.  Due to her symptoms and drop in her ejection fraction, cardiac catheterization was recommended to evaluate the etiology.  Risks, benefits, and alternatives were discussed with the patient.  STUDY DETAILS:  A standard informed consent was obtained.  The right groin area was prepped in a sterile fashion.  It was anesthetized with 1% lidocaine.  A 7-French sheath was placed in the right femoral vein. A 4-French sheath was placed in the right femoral artery.  Right heart catheterization was performed with a Swan-Ganz catheter.  Serial pressure recording was performed.  A blood sample from the right pulmonary artery was obtained to calculate the cardiac output by the Fick method.  We also obtained a sample from the right femoral artery, and because the value was low I elected to get another sample from the central aorta.  Left ventricular angiography was performed with a pigtail catheter.  Simultaneous left  ventricular and wedge pressure recordings were performed.  Coronary angiography was performed with a JL- 5, 3D RC which was used to engage the right coronary artery, as well as the SVG to LAD and SVG to OM.  For the SVG to right PDA, I used a multipurpose catheter.  All catheter exchanges were done over the wire. The patient tolerated the procedure well with no immediate complications.  STUDY FINDINGS: 1. Hemodynamic findings:     a.     Right atrial pressure was 16/17 with a mean of 12 mmHg,      right ventricular pressure was 35/6, pulmonary wedge pressure was      19/22 with a mean of 17.  Pulmonary pressure was 37/17 with a mean      of 27.  Left ventricular pressure was 116/11 with left ventricular      end-diastolic pressure of 21 mmHg.  Central aortic pressure was      116/52 with a mean pressure of 75 mmHg.  There was no significant      gradient across the aortic valve.  There was only minimal gradient      across the mitral valve in diastole.  Aortic sat was 83% and PA      sat was 55%.  Calculated cardiac output  was 4.9 L per minute with      a cardiac index of 3.2.  Calculated pulmonary vascular resistance      was 1.4 Woods unit.     b.     Left ventricular angiography:  This showed severely reduced      LV systolic function with an estimated ejection fraction of 30% to      35% with multiple wall motion abnormalities suggestive of      underlying coronary artery disease.  There was mild-to-moderate      mitral regurgitation. 2. Coronary angiography:     a.     Left main coronary artery:  The vessel was overall large      size and appeared to be aneurysmal.  The diameter is probably      around 8-10 mm.  It tapers distally, but there is really no      significant disease and is mildly calcified.     b.     Left circumflex artery:  The vessel was occluded in the mid      segment after giving a small OM branch.     c.     Left anterior descending artery:  The vessel was  moderately      calcified.  There was a diffuse 30% stenosis proximally.  LAD is      occluded in the mid segment site at the previously placed stent      after diagonal occipital branches.  The septal branch is large      size overall with 50% ostial stenosis.  The diagonal is normal      size with no significant ostial disease.     d.     Right coronary artery:  The vessel was occluded proximally.      Before the occlusion, it gives a large-sized RV marginal branch      which is free of obstructive disease, although it does have      diffuse 20% stenosis in the mid segment.     e.     SVG to OM:  The graft was patent with no significant      disease.  There was only mild anastomosis disease.  It supplies OM-      2 and OM-3 distribution.     f.     SVG to LAD:  The graft was patent with no significant      disease.  It supplies the mid and distal LAD distribution as well      as the third diagonal.     g.     SVG to right PDA:  The graft was patent with 20% diffuse      proximal stenosis and 30% mid stenosis at the valve.  Otherwise,      there is no obstructive disease.  CONCLUSION: 1. Severe native three-vessel coronary artery disease with patent     grafts. 2. Severely reduced left ventricular systolic function. 3. Mildly elevated filling pressures with no significant pulmonary     hypertension.  RECOMMENDATIONS:  I recommend medical therapy for coronary artery disease, congestive heart failure, and cardiomyopathy.  We will consider upgrading her pacemaker to an ICD CRT device based on her clinical response.     Lorine Bears, MD     MA/MEDQ  D:  08/22/2010  T:  08/23/2010  Job:  [id number]  cc:   Donnel Saxon  Electronically Signed by Lorine Bears MD on 09/05/2010 03:21:54 PM

## 2010-09-05 NOTE — Assessment & Plan Note (Signed)
Recent cardiac cath showed patent grafts. She is not having chest pain. Will continue medical therapy.

## 2010-09-05 NOTE — Assessment & Plan Note (Signed)
BP is well controlled 

## 2010-09-05 NOTE — Assessment & Plan Note (Signed)
With significantly reduced LVSF. Does not seem to be ischemic in nature. Will continue treatment with Coreg. She didn't tolerate treatment with Lisinopril due to dry cough and dyspnea. I will consider a small dose ARB in near future.  Upgrading her pacemaker to a CRT device might improve her LVSF. However, this will carry significant risk due to the fact of being on dialysis and risk of infection. Will keep this as a last resort.

## 2010-09-06 ENCOUNTER — Ambulatory Visit: Payer: Medicare Other | Admitting: Cardiovascular Disease

## 2010-09-18 LAB — COMPREHENSIVE METABOLIC PANEL
ALT: 13 U/L (ref 0–35)
AST: 17 U/L (ref 0–37)
Albumin: 3 g/dL — ABNORMAL LOW (ref 3.5–5.2)
Alkaline Phosphatase: 49 U/L (ref 39–117)
Potassium: 4.1 mEq/L (ref 3.5–5.1)
Sodium: 137 mEq/L (ref 135–145)
Total Protein: 6.6 g/dL (ref 6.0–8.3)

## 2010-09-18 LAB — CBC
HCT: 33.5 % — ABNORMAL LOW (ref 36.0–46.0)
HCT: 34.9 % — ABNORMAL LOW (ref 36.0–46.0)
Hemoglobin: 11.5 g/dL — ABNORMAL LOW (ref 12.0–15.0)
Hemoglobin: 12 g/dL (ref 12.0–15.0)
MCHC: 34.3 g/dL (ref 30.0–36.0)
MCV: 109.3 fL — ABNORMAL HIGH (ref 78.0–100.0)
RBC: 3.07 MIL/uL — ABNORMAL LOW (ref 3.87–5.11)
RBC: 3.14 MIL/uL — ABNORMAL LOW (ref 3.87–5.11)
WBC: 5.6 10*3/uL (ref 4.0–10.5)
WBC: 6.3 10*3/uL (ref 4.0–10.5)
WBC: 7.2 10*3/uL (ref 4.0–10.5)

## 2010-09-18 LAB — LIPID PANEL
Cholesterol: 136 mg/dL (ref 0–200)
Total CHOL/HDL Ratio: 2.6 RATIO
VLDL: 13 mg/dL (ref 0–40)

## 2010-09-18 LAB — RENAL FUNCTION PANEL
BUN: 38 mg/dL — ABNORMAL HIGH (ref 6–23)
CO2: 23 mEq/L (ref 19–32)
Chloride: 101 mEq/L (ref 96–112)
Creatinine, Ser: 6.83 mg/dL — ABNORMAL HIGH (ref 0.4–1.2)

## 2010-09-18 LAB — DIFFERENTIAL
Basophils Relative: 0 % (ref 0–1)
Eosinophils Absolute: 0.1 10*3/uL (ref 0.0–0.7)
Monocytes Absolute: 0.5 10*3/uL (ref 0.1–1.0)
Monocytes Relative: 8 % (ref 3–12)

## 2010-09-18 LAB — CK TOTAL AND CKMB (NOT AT ARMC)
CK, MB: 5.2 ng/mL — ABNORMAL HIGH (ref 0.3–4.0)
CK, MB: 6.6 ng/mL — ABNORMAL HIGH (ref 0.3–4.0)
Relative Index: INVALID (ref 0.0–2.5)
Total CK: 42 U/L (ref 7–177)
Total CK: 50 U/L (ref 7–177)

## 2010-09-18 LAB — BRAIN NATRIURETIC PEPTIDE: Pro B Natriuretic peptide (BNP): 1628 pg/mL — ABNORMAL HIGH (ref 0.0–100.0)

## 2010-09-18 LAB — TSH: TSH: 0.154 u[IU]/mL — ABNORMAL LOW (ref 0.350–4.500)

## 2010-09-18 LAB — PROTIME-INR: INR: 1.1 (ref 0.00–1.49)

## 2010-10-02 HISTORY — PX: INCISIONAL HERNIA REPAIR: SHX193

## 2010-10-16 NOTE — Assessment & Plan Note (Signed)
OFFICE VISIT   [name], [name]  DOB:  Mar 19, 1933                                       06/06/2009  QIONG#:[id number]   The patient is a 77 year old female with end-stage renal disease  referred by Dr. Hyman Hopes for severe claudication symptoms of the left leg.  This patient states over the last few months she has had severe pain in  her left hip, thigh and calf after walking very short distances (25  feet) which requires her to stop and rest for 5-10 minutes before  resuming activity.  She has no symptoms in the right leg.  She denies  any rest pain, history of nonhealing ulcers, infection or other  symptoms.  If she resumes walking she once again develops the symptoms.   PAST MEDICAL HISTORY:  Chronic problems include:  1. Coronary artery disease with three previous myocardial infarctions      status post coronary artery bypass grafting.  2. Hyperlipidemia.  3. End-stage renal disease on dialysis Monday, Wednesday, Friday.  4. Permanent pacemaker.  5. History of stroke with aphasia in the past.  6. Negative for diabetes or emphysema.   PAST SURGICAL HISTORY:  1. Coronary artery bypass grafting.  2. Pacemaker insertion replaced 2 weeks ago.   FAMILY HISTORY:  Positive for coronary artery disease in her father,  stroke in a daughter, negative for diabetes.   SOCIAL HISTORY:  She is widowed, has four children, is retired.  She  smoked half-pack to one pack cigarettes per day for 50 years until 3  years ago when she stopped.  She does not use alcohol.   REVIEW OF SYSTEMS:  Positive for weight loss, orthopnea, heart murmur,  home oxygen on p.r.n. basis, kidney disease, lower extremity discomfort,  history of mini stroke, dizziness, muscle pain, change in eyesight.  All  other systems negative.   PHYSICAL EXAMINATION:  Blood pressure 158/70, heart rate 84,  respirations 14, temperature 97.9.  Generally she is a well-developed,  well-nourished female who does  appear chronically ill.  She is alert and  oriented x3.  Her neck is supple, 3+ carotid pulses palpable with harsh  bruits radiating up from the precordium.  Neurologic exam is normal.  No  palpable adenopathy in the neck.  HEENT exam is unremarkable with EOMs  intact.  Cardiovascular exam is regular rhythm with a harsh systolic  ejection murmur.  Abdomen is soft, nontender without masses.  Skin is  free of rashes.  There is a functioning AV fistula in the left upper  arm.  Lower extremity exam reveals the right leg to have a 3+ femoral,  popliteal and dorsalis pedis pulse palpable.  Left leg has a 1+ femoral.  No popliteal or distal pulses.  Both feet are well-perfused.   I reviewed all of the medical data provided by Dr. Hyman Hopes as well as the  reports regarding lab work and ultrasound studies.  I ordered a lower  extremity arterial Doppler study in the office today which I reviewed  and interpreted and this revealed ABI on the left to be 0.64 compared to  normal on the right.   She does have an allergy apparently to contrast media but has had  cardiac caths with pretreatment with steroids.   I think the patient does have left iliac occlusive disease which likely  is amenable  to angioplasty and stenting.  She may have superficial  femoral occlusive disease as well.  Both saphenous veins have been  removed for coronary artery bypass grafting.  The plan is to obtain an  angiogram by Dr. Myra Gianotti on Tuesday 06/28/2009 and if PTA and stenting  is feasible we will proceed at that time.  If not we will consider left  femoral-popliteal bypass grafting.     Quita Skye Hart [place], M.D.  Electronically Signed   JDL/MEDQ  D:  06/06/2009  T:  06/07/2009  Job:  [id number]   cc:   Garnetta Buddy, M.D.

## 2010-10-16 NOTE — Discharge Summary (Signed)
NAMEOBIE, SILOS                  ACCOUNT NO.:  [id number]   MEDICAL RECORD NO.:  [id number]          PATIENT TYPE:  INP   LOCATION:  5511                         FACILITY:  [facility]   PHYSICIAN:  Wilber Bihari. Caryn Section, M.D.   DATE OF BIRTH:  [birth year]   DATE OF ADMISSION:  03/17/2007  DATE OF DISCHARGE:  03/21/2007                               DISCHARGE SUMMARY   DISCHARGE DIAGNOSES:  1. Gastrointestinal bleed secondary to duodenal ulcer.  2. Coronary artery disease status post coronary artery bypass      grafting.  3. History of cerebrovascular accident requiring long term      anticoagulation.  4. Anemia.  5. Hypertension.  6. Secondary hyperparathyroidism.  7. Hypothyroidism.  8. End-stage renal disease.   PROCEDURE:  1. March 18, 2007, abdominal CT without contrast.  Impression:      a.     Exam is limited as it is performed without IV and apparently       impaired contrast.      b.     Right lower lobe opacity suspicious for pneumonia.      c.     Possible gastric wall thickening versus under distention.       If gastritis or gastric neoplasia is a concern, upper GI or       endoscopy should be considered.      d.     Mild right sided colitis.  Consider infection or ischemia.  2. Renal atrophy with lesions of varying complexity bilaterally.  3. February 16, 2007, pelvis CT without contrast.  Impression:      a.     Hysterectomy but no acute pelvic process.      b.     Question mild fecal impaction.  4. March 18, 2007, EGD. Diagnoses:  Gastritis unspecified,      duodenitis without hemorrhage and duodenal ulcer acute with      hemorrhage, performed by Dr. Arlyce Dice.   CONSULTATIONS:  1. Dr. Valera Castle.  2. Dr. Ilda Mori.   HISTORY OF PRESENT ILLNESS:  This is a 77 year old white woman with end-  stage renal disease (hemodialysis on Tuesday, Thursday and Saturday at  the [facility]), anemia, secondary hyperparathyroidism  status post parathyroidectomy.   The patient states that on Friday  evening she began having frequent diarrhea episodes with dark red  stools.  She reported to the hemodialysis Saturday morning as scheduled,  however, she was only able to undergo two hours of hemodialysis and had  to sign off early secondary to persistent diarrhea.  The patient went  home where she stated she had a temperature, although no actual  recording was taken.  She also had vomiting with continued diarrhea and  cold chills.  The patient states her emesis was also dark red.  This  continued for the remainder of the weekend and into the beginning of  this week.  Earlier this morning, approximately 1:00 a.m., the patient  stated she notified her daughter requesting to go to the emergency room.  She could no longer wait to see  the patient at the dialysis center  today.  The patient went to [facility] Room for evaluation and  her hemoglobin was found to 5.8.  She is transferred to [facility] for further evaluation.   ADMISSION LABORATORY DATA:  A pH 7.475, pCO2 39.5, pO2 is 123, bicarb  was 29.3.  WBC 7.9, hemoglobin 6.2, hematocrit 18.5 and platelet 215.  Sodium was 141, potassium 6, chloride is 103, CO2 26, glucose 89, BUN  179, creatinine 6.23.  Total bilirubin 0.6, alkaline phos 35, AST is 14,  ALT is 20, total protein 4, albumin 1.8 and calcium 7.7.   DIAGNOSTIC RADIOLOGICAL EXAMINATIONS:  March 17, 2007, one-view chest  x-ray.  Impression:  Interstitial edema and bilateral effusions.  March 18, 2007, one-view chest x-ray.  Impression:  No significant  interval change, persistent bilateral pleural effusions and bibasilar  atelectasis, right greater than left, mild interstitial edema.   HOSPITAL COURSE:  PROBLEM #1 -  GASTROINTESTINAL BLEED SECONDARY TO  DUODENAL ULCER:  The patient was transferred from [facility] she  was kept n.p.o. and a GI consult was obtained.  Frequent hemoglobin  monitoring was performed and  the patient was transfused.  She was also  placed on proton pump inhibitor therapy as well as antiemetic therapy  and her INR was supratherapeutic upon admission and the patient required  FFP in addition to her packed red blood cells in order for the EGD to be  performed.  Eventually the INR greatly improved and the patient  underwent an EGD on March 18, 2007, with results as above.  The  patient's bleeding did resolve during her hospitalization with her  hemoglobin stabilizing.  By discharge, the patient's hemoglobin was 9.7,  hematocrit 28.4.  GI recommended avoiding aspirin and Coumadin therapy  for two weeks.  They also recommend continuing proton pump inhibitor  therapy as an outpatient and Colace therapy as well.   PROBLEM #2 -  CORONARY ARTERY DISEASE STATUS POST CORONARY ARTERY BYPASS  GRAFTING:  Cardiac enzymes were obtained during the patient's  hospitalization which revealed elevated troponin levels which were very  concerning given the patient underwent a CABG approximately six weeks  prior to the admission.  As a result, cardiology was obtained and a  thorough evaluation was performed.  Cardiology felt that the elevated  lab work was not indicative of an acute coronary attack.  They felt it  was secondary to stress or strain.  They recommended restarting the  patient's low-dose beta blocker therapy, and also felt it was okay to  proceed with the scheduled EGD.  The patient remained without active  signs of chest pain during her hospitalization and heart rate remained  regular.   PROBLEM #3 -  HISTORY OF CEREBROVASCULAR ACCIDENT REQUIRING LONG-TERM  ANTICOAGULATION THERAPY:  The patient's Coumadin and aspirin therapy was  held upon admission secondary to a supratherapeutic INR.  She ultimately  required transfusion of 4 units of FFP in order to decrease her INR for  an EGD to be performed.  Neurologically, the patient remained stable  during her stay and as above, GI  recommended reinitiating her aspirin  and Coumadin therapy approximately two weeks after discharge.   PROBLEM #4 -  ANEMIA:  Please refer to #1 and please note the patient  ultimately required 4 units of packed red blood cells during her  hospitalization and in addition to 4 units of FFP.   PROBLEM #5 -  HYPERTENSION:  Initially upon admission, the  patient's  antihypertensive therapy was held as her blood pressure was slightly  hypotensive.  This blood pressure began to improve as the GI bleeding  resolved and hemoglobin stabilized.  The patient was restarted on her  low-dose beta blocker therapy throughout her hospitalization.  The  patient remained off of her Norvasc, hydralazine and Imdur therapy.  She  did not restart that.  Prior to discharge, the patient's systolic blood  pressure ranged from the low 100s to 120s, heart rate ranged from the  50s to 70s.   PROBLEM #6 -  SECONDARY HYPERPARATHYROIDISM:  The patient continued  vitamin D, phosphate binder and Sensipar therapy throughout her  hospitalization which she tolerated well without difficulty.  Prior to  discharge, the patient's calcium was 7.9 and phosphorus 4.7.   PROBLEM #7 -  HYPOTHYROIDISM:  The patient continued on her of a  thyroxine during her hospitalization again which she tolerated well,  without difficulty.   PROBLEM #8 -  END-STAGE RENAL DISEASE:  The patient continued  hemodialysis throughout her hospitalization via a left upper extremity  AV fistula.  Average ultrafiltration was approximately 3 liters.  Average blood flow rate was 400.  Vital signs remained stable throughout  her treatment with systolic blood pressure ranging from the low 100s to  120s and heart rate ranging from the 50s to 80s.   DISCHARGE MEDICATIONS:  1. Sensipar 3 mg p.o. daily with food.  2. PhosLo 667 mg 3 tablets p.o. t.i.d. with meals.  3. Lipitor 80 mg one p.o. each evening.  4. Levothyroxine 150 mcg one p.o. daily.  5.  Nephro-Vite one p.o. daily.  6. Omeprazole 20 mg one p.o. nightly.  7. Metoprolol 12.5 mg one p.o. b.i.d.  Do not take a.m. dose prior to      hemodialysis.  8. Colace 100 mg p.o. b.i.d.  9. Enteric-coated aspirin 81 mg daily. Start in two weeks.  10.Coumadin 5 mg daily, restart in two weeks.  11.The patient is instructed to discontinue her Norvasc, hydralazine,      Lasix and Isordil therapy.  Again, she will restart her Coumadin      and aspirin therapy in two weeks.   HEMODIALYSIS MEDICATIONS:  1. Tight heparin.  2. Epogen 28,000 units IV each hemodialysis treatment.  3. Zemplar 2 mcg IV each hemodialysis treatment.  4. Venafer 50 mg IV weekly with hemodialysis.   DISCHARGE INSTRUCTIONS:  1. The patient is to resume a renal diet with a 1200 mL fluid      restriction.  2. Activity as tolerated.  3. She will return to her outpatient hemodialysis as scheduled prior      to admission.   HEMODIALYSIS INSTRUCTIONS:  1. Estimated dry weight is 60 kg.  2. Obtain in-center hemoglobin each hemodialysis treatment x3.  Notify      Quintessa Simmerman with results.  3. Obtain a PT/INR one week after receiving Coumadin therapy.  Notify      Brelee Renk with results.  No other hemodialysis changes at this time.   NOTE:  It took approximately 45 minutes to complete this discharge.      Tracey P. Sherrod, NP      Richard F. Caryn Section, M.D.  Electronically Signed    TPS/MEDQ  D:  04/07/2007  T:  04/08/2007  Job:  [id number]   cc:   [facility] Lelon Huh C. Daleen Squibb, MD, [facility]  Barbette Hair. Arlyce Dice, MD,FACG

## 2010-10-16 NOTE — Procedures (Signed)
VASCULAR LAB EXAM   INDICATION:  Follow-up bilateral common iliac arteries.  Claudication  within the left lower extremity.  The patient had bilateral common iliac  artery stents 06/27/2009.   HISTORY:  Diabetes:  no  Cardiac:  yes  Hypertension:  Yes   EXAM:   IMPRESSION:  Patent bilateral common iliac artery stents with triphasic  wave forms.  No significant changes with ankle brachial indices.  Toe  brachial indices indicate a normal study.  There was an area of  increased velocity within the left distal external iliac artery at 2.16  meters per second within the mid left distal external iliac artery.   ___________________________________________  V. Charlena Cross, MD   OD/MEDQ  D:  04/12/2010  T:  04/12/2010  Job:  [id number]

## 2010-10-16 NOTE — Consult Note (Signed)
[name], [name]                  ACCOUNT NO.:  [id number]   MEDICAL RECORD NO.:  [id number]          PATIENT TYPE:  INP   LOCATION:  2115                         FACILITY:  [facility]   PHYSICIAN:  Jesse Sans. Wall, MD, FACCDATE OF BIRTH:  Oct 08, 1932   DATE OF CONSULTATION:  03/18/2007  DATE OF DISCHARGE:                                 CONSULTATION   REASON FOR CONSULTATION:  We were asked by Dr. Marina Gravel to evaluate  the patient with recent coronary bypass grafting and positive troponins.   HISTORY OF PRESENT ILLNESS:  She is a 77 year old white female who was  admitted with an upper GI bleed.  Hemoglobin was found to be 5.8.  She  has had both signs of upper GI bleeding and lower GI bleeding.  She is  potentially going to undergo an endoscopy today.   Her cardiac history is significant for a previous stent in 1999 at [facility].  Records are not available.  She does not remember the  cardiologist.   She presented with what sounds like angina prior to having her bypass  surgery in September of 2008 at [facility].  She  was told that she did not have a heart attack.   She also has a permanent pacer in, which has been in for several years.  The etiology for that is unknown.   Her troponins have increased to 0.65.  The initial one was 0.08.  Her  CPK MBs are normal and relative index is negative.   She has been transfused, and her current hemoglobin is up to 8.6.  She  is comfortable.  She has had no chest pressure or angina.  She has had  some shortness of breath prior to admission.   PAST MEDICAL HISTORY:  1. In addition to the above, she has a history of hypertension.  2. Status post a CVA and is on chronic Coumadin.  3. History of congestive heart failure.  4. History of thyroidectomy is on thyroid replacement.  5. History of abdominal aortic aneurysm, as well.   MEDICATIONS ON ADMISSION:  1. Levothyroxine 150 mcg a day.  2. Sensipar 30  mg q.a.m.  3. PhosLo 3 tablets three times a day.  4. Folic acid 1 mg twice a day.  5. Hydralazine 50 mg three times a day.  6. Norvasc 5 mg q.h.s.  7. Lipitor 80 mg q.h.s.  8. Aspirin enteric-coated 81 mg a day.  9. Imdur 40 mg t.i.d.  10.Metoprolol 12.5 b.i.d.  11.Coumadin 5 mg every evening.  12.Furosemide 80 mg four times a day.   ALLERGIES:  She is intolerant of IV DYE.   She has end-stage renal disease and is on hemodialysis at [facility].   SOCIAL HISTORY:  She lives at home by herself.  She is retired.  She is  a widow.  She has a smoking history.  She does not drink or use any  drugs.  She has a 5th grade education.  Her family history is  noncontributory.   REVIEW OF SYSTEMS:  Remarkable for  fever, chills, sweats, decreased  appetite, increased fatigue, shortness of breath, abdominal pain, melena  and vomiting bright red blood.  The rest review of the review of systems  are negative.   PHYSICAL EXAMINATION:  VITAL SIGNS:  Blood pressure is 124/29, her pulse  is 62.  She is in sinus rhythm with ventricular pacing.  Her temperature  is 97.8, respiration rate is 18.  GENERAL:  She is in no acute distress.  She is slightly pale.  Skin is  warm and dry.  She is alert and oriented.  HEENT:  Normocephalic, atraumatic.  Pupils are equal, round and reactive  to light and accommodation.  Extraocular movements intact.  Sclerae are  nonicteric.  Facial symmetry is normal.  NECK:  Supple.  Carotids upstrokes are equal bilaterally with bilateral  bruits.  Thyroid is not enlarged.  Trachea is midline.  LUNGS:  Clear except for some crackles in the very distal bases.  HEART:  A regular rate and rhythm with a paradoxically split S2.  There  is no gallop.  ABDOMEN:  Slightly distended, and I did not palpate it.  EXTREMITIES:  Reveal no edema.  Pulses were present.  She has got  pneumatic cuffs on at present.  NEUROLOGIC:  Grossly intact.   ELECTROCARDIOGRAM:  Normal sinus rhythm  with ventricular pacing.   CHEST X-RAY:  Some mild interstitial changes with the question of some  small effusions.   ASSESSMENT:  1. Coronary artery disease status post coronary bypass grafting in      September of 2008.  Details are not available.  She has had a      previous stent in 1999.  She presented with angina at the time.      She has had no angina prior to this admission despite a dramatic      decrease in her hemoglobin.  She has had some shortness of breath      which is expected.  Her troponin pattern is consistent with strain.      Normal CPK-MB does not support an acute coronary syndrome.  2. Upper gastrointestinal bleed for endoscopy.  3. History of cerebrovascular accident on chronic Coumadin, now      discontinued.  4. Status post permanent pacemaker.   RECOMMENDATIONS:  1. Proceed with endoscopy and GI evaluation.  2. Transfuse to hemoglobin of greater than or equal to 9.  3. Agree with no Coumadin or aspirin.  4. Restart metoprolol 12.5 b.i.d.  5. Restart other cardiac and blood pressure medications once further      stabilized.   Thank you for the consultation.      Thomas C. Daleen Squibb, MD, [facility]  Electronically Signed     TCW/MEDQ  D:  03/18/2007  T:  03/19/2007  Job:  [id number]   cc:   Norman Herrlich, M.D.

## 2010-10-16 NOTE — Consult Note (Signed)
NAME:  [name], [name]                  ACCOUNT NO.:  [id number]   MEDICAL RECORD NO.:  [id number]          PATIENT TYPE:  INP   LOCATION:  5506                         FACILITY:  [facility]   PHYSICIAN:  Jesse Sans. Wall, MD, FACCDATE OF BIRTH:  [birth year]   DATE OF CONSULTATION:  05/12/2007  DATE OF DISCHARGE:  05/13/2007                                 CONSULTATION   ADDENDUM:  This is an addendum secondary to more information obtained  from her primary cardiologist. Her primary care physician is Dr. Ardelle Park.  Primary cardiologist is Dr. Dulce Sellar.  both these doctors an [place].   SUMMARY OF HISTORY:  We sent a waiver to [facility] and Dr.  Dulce Sellar to obtain more information in regards to [name]'s history.  According to the information received she has a Biotronik Philos DR  pacer which was inserted secondary to PAT.  She has a history of a dye  allergy, dyslipidemia, COPD, stroke with residual right-sided weakness,  hypothyroidism status post parathyroid resection. Prior to her bypass  surgery echocardiogram on February 10, 2007 showed inferobasilar  hypokinesis, left atrial enlargement, MAC EF 50-55%. Carotid Dopplers  performed on February 11, 2007 at [facility] showed a right  ICA of 1-39% left ICA 40-59% with vertebral on the left was retrograde.  Catheterization on February 11, 2007 by Dr. Beverely Pace at [facility] showed a 95% circumflex, 95% OM, 95% proximal circumflex 75%  LAD 50% proximal RCA with normal LV function.  She underwent a three-  vessel bypass surgery on February 13, 2007 by Dr. Arvilla Market at [facility]. She received a saphenous vein graft to the LAD, saphenous vein  graft to the OM, saphenous graft to the RCA.  Postoperatively she was  started on hemodialysis.  The copies of the records obtained from Dr.  Dulce Sellar at [facility] will be placed on the patient's chart here  at [facility] for future references.      Joellyn Rued,  PA-C      Jesse Sans. Daleen Squibb, MD, [facility]  Electronically Signed    EW/MEDQ  D:  05/13/2007  T:  05/14/2007  Job:  [id number]

## 2010-10-16 NOTE — Op Note (Signed)
[name], [name]                  ACCOUNT NO.:  [id number]   MEDICAL RECORD NO.:  [id number]          PATIENT TYPE:  INP   LOCATION:  3740                         FACILITY:  [facility]   PHYSICIAN:  Janetta Hora. Fields, MD  DATE OF BIRTH:  June 22, 1932   DATE OF PROCEDURE:  04/11/2007  DATE OF DISCHARGE:                               OPERATIVE REPORT   PROCEDURE:  Revision of left upper arm AV fistula.   PREOPERATIVE DIAGNOSIS:  Aneurysm, left arm, AV fistula.   POSTOPERATIVE DIAGNOSIS:  Aneurysm, left arm, AV fistula.   ANESTHESIA:  Local with IV sedation.   ASSISTANT:  Zenaida Niece, R.N.F.A.   OPERATIVE FINDINGS:  Interposition 8-mm PTFE graft.   OPERATIVE DETAILS:  After obtaining informed consent, the patient was  taken to the operating room.  The patient was placed in the supine  position on the operating room table.  After adequate sedation, the  patient's entire left upper extremity was prepped and draped in usual  sterile fashion.  The patient had a degenerative aneurysmal segment at  the mid upper portion of a preexisting left brachiocephalic AV fistula.  Local anesthesia was infiltrated approximately 1 cm below the aneurysmal  fistula and above the aneurysmal fistula.  Longitudinal incisions were  made above and below the aneurysmal segment and the fistula dissected  free circumferentially.  It should be noted that the patient's skin was  quite thin and friable and tore easily.  After the fistula had been  dissected free circumferentially in both locations, the patient was  given 3000 units of intravenous heparin.  The fistula was clamped  proximally.  It was also clamped just below the aneurysm.  The fistula  was divided in this location, and an 8 mm PTFE graft was brought up in  the operative field, tunneled subcutaneously between the two incisions  and then sewn end-to-end to the proximal anastomosis using a running 6-0  Prolene suture.  At completion of the  anastomosis, the graft was clamped  at the more distal incision in the upper arm.  The proximal portion of  the fistula that was aneurysmal was oversewn with a running 5-0 Prolene  suture.  Next, attention was turned to the upper portion of the  aneurysm.  The fistula was divided in this location and the aneurysm  also oversewn.  It decompressed initially.  However, probably due to  secondary venous channels, it did refill but was normal pulsatile  pressure within the aneurysm.  The 8-mm PTFE graft was then cut to  length and sewn end-to-end to the distal portion of the fistula using a  running 6-0 Prolene suture.  Just prior to completion of the  anastomosis, it was forward bled, back bled and thoroughly flushed.  Anastomosis was secured.  Clamps were released.  There was palpable  thrill in the fistula immediately.  Hemostasis was obtained with  thrombin and Gelfoam and 30 mg of protamine.  Skin incisions were then  closed with interrupted nylon sutures.  A Xeroform dressing was placed  over the arm to help protect the skin.  A dry Kerlix dressing and Ace wrap were also placed over this.  The  patient tolerated the procedure well, and there were no complications.  The patient is at high risk for wound breakdown due to thin fragile  skin.  The patient was taken to the recovery room in stable condition.      Janetta Hora. Fields, MD  Electronically Signed     CEF/MEDQ  D:  04/14/2007  T:  04/15/2007  Job:  [id number]

## 2010-10-16 NOTE — H&P (Signed)
[name], [name]                  ACCOUNT NO.:  [id number]   MEDICAL RECORD NO.:  [id number]          PATIENT TYPE:  INP   LOCATION:  3740                         FACILITY:  [facility]   PHYSICIAN:  Rod Holler, MD     DATE OF BIRTH:  06/20/1932   DATE OF ADMISSION:  04/11/2007  DATE OF DISCHARGE:                              HISTORY & PHYSICAL   CHIEF COMPLAINT:  Shortness of breath.   HISTORY OF PRESENT ILLNESS:  [name] is a pleasant 77 year old female  with multiple medical problems to include end-stage renal disease on  hemodialysis, coronary artery disease status post coronary artery bypass  graft, hypertension who presented to an outside hospital with complaints  of shortness of breath.  Today around 2 o'clock, while sitting down, the  patient had onset of shortness of breath that has waxed and waned  throughout the day.  She has had no complaints of chest pain.  She has  no complaints of lower extremity edema, no PND or orthopnea, no  palpitations, no syncope or presyncope.  At the outside hospital, the  patient was given aspirin, nitroglycerin paste, Lasix IV.  The patient  states that she is feeling improved now.  Reportedly, the patient  appeared to be extremely short of breath at the outside hospital, and  was sating in the low 90%.  The ER physician at the outside hospital was  concerned that she might need urgent hemodialysis.   PAST MEDICAL HISTORY:  1. End-stage renal disease on hemodialysis.  2. Coronary artery disease status post coronary artery bypass graft at      [facility].  3. Status post pacemaker placement.  4. Hypertension.  5. History of CVA.  6. History of CHF.  7. History of AAA.  8. GI bleed secondary duodenal ulcer requiring recent hospitalization.  9. Secondary hyperparathyroidism.  10.Hypothyroidism.   MEDICATIONS:  1. Sensipar 30 mg p.o. daily.  2. PhosLo three tablets p.o. t.i.d.  3. Lipitor 80 mg p.o. daily.  4. Levothyroxine 150 mcg  p.o. daily.  5. Nephro-Vite one tablet p.o. daily.  6. Omeprazole 20 mg p.o. daily.  7. Lopressor 12.5 mg p.o. b.i.d.  8. Colace 100 mg p.o. b.i.d.  9. Aspirin 81 mg p.o. daily.  10.Coumadin 5 mg p.o. daily.   ALLERGIES:  IVP DYE.   SOCIAL HISTORY:  The patient is a former smoker.   FAMILY HISTORY:  Noncontributory.   REVIEW OF SYSTEMS:  All systems are reviewed in detail and are negative  except as above in history of present illness.   PHYSICAL EXAMINATION:  VITAL SIGNS:  Blood pressure 150s/60s, heart rate  70.  GENERAL:  At thin, elderly female, alert and oriented x3, no apparent  distress.  HEENT: Atraumatic, normocephalic.  Pupils equal, round, react to light,  extraocular movements intact.  Oropharynx clear.  CHEST:  Bibasilar crackles, otherwise clear to auscultation bilaterally.  CARDIAC:  Regular rhythm, normal rate, 2/6 systolic ejection murmur  heard throughout the precordium.  ABDOMEN:  Soft, nontender, nondistended.  Active bowel sounds.  EXTREMITIES: No clubbing, cyanosis or  edema.  NEUROLOGIC: No focal deficits.   VQ scan reportedly negative for a PE.   LABORATORY DATA:  White blood cell count 6.8, hematocrit 35.9, platelet  count 237.  Sodium 139, potassium 4.4, chloride 96, bicarb 27, BUN 21,  creatinine 4.3.  CK 25, troponin 0.07, myoglobin 100.  NT proBNP 16,100.  Total bilirubin 0.2, SGOT 26, SGPT 37, alk phos 69.  Blood gas pH 7.47,  pCO2 45, pO2 102.  INR 1.9.   EKG sinus, ventricular pacing.   IMPRESSION AND PLAN:  A 77 year old female with multiple medical  problems who presents with shortness of breath.   PLAN:  1. Cardiovascular:  Place on telemetry, rule out with serial cardiac      enzymes, home cardiovascular medicines, daily EKG, nitroglycerin      paste for better blood pressure control.  2. Renal:  Will need hemodialysis in the morning.  3. Fluids, electrolytes and nutrition:  Renal cardiac diet, BNP and      magnesium in the  morning.  4. Hematologic/GI:  Place the patient on PPI and guaiac all stools.      Rod Holler, MD  Electronically Signed     TRK/MEDQ  D:  04/11/2007  T:  04/12/2007  Job:  [id number]

## 2010-10-16 NOTE — Discharge Summary (Signed)
NAMEVIDYA, BAMFORD                  ACCOUNT NO.:  [id number]   MEDICAL RECORD NO.:  [id number]          PATIENT TYPE:  INP   LOCATION:  6531                         [facility]AC[facility]  [facility]   PHYSICIAN:  Verne Carrow, MDDATE OF BIRTH:  15-Dec-1932   DATE OF ADMISSION:  07/21/2008  DATE OF DISCHARGE:  07/22/2008                               DISCHARGE SUMMARY   Primary cardiologist Dr. Dulce Sellar at [facility]. Primary care Joffre Lucks is  Dr. Ardelle Park  Endocrinologist is Dr. Reather Littler.   DISCHARGE DIAGNOSIS:  Non-ST-segment elevation myocardial infarction.   SECONDARY DIAGNOSES:  1. Coronary artery disease status post coronary artery bypass graft x3      in 2008 with vein graft to the left anterior descending artery,      vein graft to the obtuse marginal graft to the right coronary      artery.  2. End-stage renal disease on Monday-Wednesday-Friday dialysis in      [place].  3. History of cerebrovascular accident.  4. Chronic diastolic congestive heart failure.  5. Hypertension.  6. Hyperlipidemia.  7. Ischemic cardiomyopathy with ejection fraction 40% by left      ventriculography this admission.  8. Carotid artery disease.  9. Hypothyroidism status post thyroidectomy.  10.Secondary hyperparathyroidism.  11.History of gastrointestinal bleed.  12.Chronic obstructive pulmonary disease.  13.Status post permanent pacemaker placement.   ALLERGIES:  IV CONTRAST.   PROCEDURES:  Left heart cardiac catheterization with graft injections  revealing native multivessel disease with patency of all vein grafts  with EF 45% with apical and inferior apical hypokinesis.   HISTORY OF PRESENT ILLNESS:  A 77 year old Caucasian female with the  above problem list.  She was in her usual state of health until  approximately 1 week ago when she began to experience fatigue with  decreased exercise tolerance and dyspnea on exertion.  On the day of  admission she also noted worsening nausea and anxiousness.   She has a  home health nurse whom she informed of her symptoms, and EMS was  subsequently activated.  The patient was taken via EMS to [facility] ED  where ECG showed no acute changes.  She was admitted for further  evaluation.   HOSPITAL COURSE:  Ms. Bruck subsequently ruled in for non-ST-elevation  MI with mild elevation of her CK-MB to 6.6 and troponin to 1.32.  Her  CKs have remained normal.  In light of her prior history of coronary  disease and elevated cardiac markers, we performed a left heart cardiac  catheterization following IV contrast prophylaxis with steroids.  Catheterization revealed native 3-vessel coronary artery disease with  patency of 3 of 3 grafts.  EF was 45% apical and intra-apical  hypokinesis.  It was felt that the patient would best be served through  medical management and she has been placed on beta-blocker therapy as  well as Plavix in the setting of acute coronary syndrome.  Postcatheterization Ms. Yonts has had no recurrent chest discomfort. She  has been ambulating without difficulty and will be discharged home this  evening in good condition.   Ms. Bechler  was seen by the nephrology service while hospitalized and  Monday-Wednesday-Friday dialysis was continued.  She did undergo  dialysis this morning, Friday, July 22, 2008.   DISCHARGE LABS:  Hemoglobin 11.5, hematocrit 33.5, WBC 5.6, platelets  250. INR 1.1.  Sodium 139, potassium 4.4, chloride 101, CO2 of 23, BUN  38, creatinine 6.83, glucose 101, total bilirubin 0.5, alkaline  phosphatase 49, AST 17, ALT 13, total protein 6.6, albumin 2.9, calcium  9.1, phosphorus 3.6, magnesium 2.4. CK 50, MB 5, troponin I 1.15.  BNP  1628 on admission. Total cholesterol 136, triglycerides 64, HDL 53, LDL  70.  TSH 0.154.   DISPOSITION:  The patient will be discharged home today in good  condition.   FOLLOW-UP PLAN AND APPOINTMENTS:  We have recommended that Ms. Ellender  follow up with Dr. Dulce Sellar in the next  2 weeks.  We recommend that she  follow up with Dr. Ardelle Park within the next 1-2 weeks for further  evaluation of her thyroid function as her TSH was low during this  admission at 0.154.  She will require adjustment of her Synthroid.  She  will also continue Monday-Wednesday-Friday dialysis in [place].   DISCHARGE MEDICATIONS:  1. Aspirin 325 mg daily.  2. Plavix 75 mg daily.  3. Colace 100 mg b.i.d..  4. Lasix 20 mg daily.  5. Synthroid 150 mcg daily.  6. Lipitor 80 mg q.h.s.  7. Nephro-Vite 1 p.o. daily  8. Sensipar 30 mg daily.  9. Protonix 40 mg daily (in place of Prilosec which the patient was      previously on at home).  10.Nitroglycerin 0.4 mg sublingually p.r.n. chest pain.  11.Toprol XL 25 mg daily.   OUTSTANDING LABS AND STUDIES:  None.   Duration of discharge encounter 35 minutes, including physician time.      Nicolasa Ducking, ANP      Verne Carrow, MD  Electronically Signed   CB/MEDQ  D:  07/22/2008  T:  07/22/2008  Job:  [id number]   cc:   Mammie Lorenzo, MD

## 2010-10-16 NOTE — Consult Note (Signed)
NAME:  [name], [name]                  ACCOUNT NO.:  [id number]   MEDICAL RECORD NO.:  [id number]          PATIENT TYPE:  INP   LOCATION:  5506                         FACILITY[facility]  [facility]   PHYSICIAN:  Alvin C. Lowell Guitar, M.D.  DATE OF BIRTH:  29-Mar-1933   DATE OF CONSULTATION:  DATE OF DISCHARGE:                                 CONSULTATION   HISTORY OF PRESENT ILLNESS:  [name] is a 77 year old female with  history of end-stage renal disease who started dialysis in September  2008 after she had open heart surgery at [facility].  She received dialysis on Tuesday, Thursday at the [facility]  and has the acute onset today of shortness of breath without any  provoking factors.  She presents to the [facility] Emergency Room with  chest x-ray showing congestive heart failure, and potassium returned at  7.1 mEq per liter.  Of note, she was hospitalized in November with  complaints of shortness of breath.  She had a negative V/Q scan and 2D  echocardiogram revealed ejection fraction of 50%.  In addition, she was  admitted with gastrointestinal bleeding in October 2000 and she was  taken off Coumadin that she was previously taking for a stroke.   PAST HISTORY:  1. Gastroesophageal bleeding secondary to duodenal ulcer in October      2008.  2. History of CVA requiring Coumadin in the past.  3. History of Anemia.  4. History hypertension.  5. Hypothyroidism.  6. End-stage renal disease requiring dialysis.  7. Status post coronary bypass grafting in September 2000 a [facility].  8. Status post pacemaker placement.  9. Status post hysterectomy.  10.History of abdominal aortic aneurysm.  11.History of secondary hyperparathyroidism.   CURRENT MEDICATIONS:  1. Sensipar 30 mg daily.  2. PhosLo three a.c. t.i.d.  3. Lipitor 80 mg q.h.s.  4. Levothyroxine 150 mcg daily.  5. Nephro-Vite one daily.  6. Omeprazole 20 mg at bedtime.  7. Metoprolol  12.5 mg b.i.d.  8. Colace 100 mg b.i.d.  9. Enteric-coated aspirin 81 mg daily   SOCIAL HISTORY:  The patient is a former smoker for many years, does not  currently consume alcoholic beverages.  Previously worked at [employer].   FAMILY HISTORY:  Remarkable for cancer of the colon.   PHYSICAL EXAMINATION:  VITAL SIGNS:  Blood pressure 153/73, afebrile.  GENERAL:  Pleasant Caucasian female, awake, alert and oriented.  HEENT:  Atraumatic, normocephalic.  Extraocular movements intact.  LUNGS:  Crackles bilaterally in lower one third lung field.  HEART:  Regular rhythm and rate.  S1, S2.  There is a grade 2/6 systolic  ejection murmur, and 1/2 diastolic murmur at the lower left sternal  border.  ABDOMEN:  Soft.  No palpable megaly.  The sternotomy scar is well-  healed.  EXTREMITIES:  Left upper extremity AV access with an ecchymoses present  and functioning.  There is 1+ edema in the lower extremities.   STUDIES:  His x-ray shows interstitial edema and cardiomegaly.  Electrocardiogram revealed  left bundle branch block.  BNP is 2824,  potassium 7.6 and repeat is 6.2.   ASSESSMENT:  1. Hyperkalemia.  2. Congestive heart failure of uncertain etiology, this has been      recurrent despite good left ventricular function, rule out valvular      heart disease, rule out volume mediated congestive heart failure,      rule out ischemic mediated (doubtful in light of recent definitive      procedure - CABG).   PLAN:  1. Urgent hemodialysis tonight.  2. Ask Cardiology to see the patient in the morning.  She has seen the      [facility] in the past.      Britt Bottom C. Lowell Guitar, M.D.  Electronically Signed     ACP/MEDQ  D:  05/11/2007  T:  05/12/2007  Job:  [id number]

## 2010-10-16 NOTE — H&P (Signed)
[name], [name]                  ACCOUNT NO.:  [id number]   MEDICAL RECORD NO.:  [id number]          PATIENT TYPE:  INP   LOCATION:  6531                         FACILITY:  [facility]   PHYSICIAN:  Verne Carrow, MDDATE OF BIRTH:  27-Feb-1933   DATE OF ADMISSION:  07/21/2008  DATE OF DISCHARGE:                              HISTORY & PHYSICAL   PRIMARY CARDIOLOGIST:  Dr. Dulce Sellar.   PRIMARY CARE PHYSICIAN:  Dr. Ardelle Park.   ENDOCRINOLOGIST:  Reather Littler, MD   The patient has been followed here at [facility] by [facility]  [facility]iology, originally seen by Dr. Valera Castle.   CHIEF COMPLAINT:  Chest pain.   HISTORY OF PRESENT ILLNESS:  A 77 year old female with a known history  of coronary artery disease, end-stage renal disease, diastolic heart  failure, hypertension, dyslipidemia, carotid artery disease, and status  post CVA, as well as hypothyroidism presenting with 1 week of worsening  of fatigue, worsening of dyspnea on exertion, shortness of breath, and  typical anginal symptoms with anxiety attack.  The patient noticed  feeling worse than usual in terms of her shortness of breath and  fatigue.  It has been troublesome to her over the last week.  She also  had her typical anginal symptoms that responded to nitroglycerin.  However, today, she noticed slightly worsening nausea than she has  experienced over last week and feeling jittery and anxious that prompted  her to discuss this with her home health nurse who in turn called EMS.  The patient reports excellent compliance with her medication and  dialysis appointments completing a regular dialysis appointment  yesterday.  The patient has no other questions or concerns except for  some minimal dizziness with rapid eye movement that she said has been  troublesome recently.  Please noted that when EMS saw the patient and  did a rhythm strip, she appeared to have ST elevation and the ST  elevation MI protocol was initiated.   However, upon review at [facility], the patient was chest painfree and her EKG did not indicate a  true ST elevation MI.   PAST MEDICAL HISTORY.:  1. Coronary artery disease status post 3-vessel CABG in September 2008      with SVG to LAD, SVG to OM, SVG to RCA, and then NSTEMI with stent      to the LAD in 2001, and stent ?  vessel in 1999.  2. End-stage renal disease, on hemodialysis Tuesday, Thursday, and      Saturday in [place].  3. Status post CVA, ? year.  4. Diastolic CHF.  BNP in December 2008 measured to 2824, EF equal to      50-55%.  5. Hypertension.  6. Dyslipidemia.  7. Carotid artery disease, left ICA 40-59%, right RCA 1-39%.  8. Hypothyroidism (status post thyroidectomy).  9. Secondary hyperparathyroidism.  10.History of upper GI bleed.  11.COPD.  12.Status post permanent pacemaker placement.   SOCIAL HISTORY:  The patient lives in [place].  She lives alone.  She  is widowed and has 4 children who check on her  frequently.  She is  retired.  She had a 25-pack-year smoking history, quit in September  2008.  She does not drink at all.  She uses no illicit drugs.  No herbal  medications other than vitamins given to her by her nephrologist.  She  eats a regular diet and is unable to regularly exercise.   FAMILY HISTORY:  Noncontributory.   REVIEW OF SYSTEMS:  As described in HPI.   ALLERGIES:  IVP DYE.   MEDICATIONS:  1. Home O2.  2. Aspirin 81 mg p.o. daily.  3. Colace 100 mg p.o. b.i.d.  4. Lasix 20 mg p.o. daily.  5. Levothroid 150 mcg p.o. daily.  6. Lipitor 80 mg p.o. daily.  7. Nephro-Vite tabs 1 p.o. daily.  8. Omeprazole 20 mg p.o. daily.  9. Sensipar 30 mg p.o. daily.  10.Metoprolol 12.5 mg p.o. b.i.d.  11.With dialysis, iron Venofer 100 mg ? frequency.  12.(With dialysis) Epogen 2800 units every dialysis.   PHYSICAL EXAMINATION:  VITAL SIGNS:  Temperature 97.6 degrees  Fahrenheit, BP 151/65, pulse 74, respiration rate 16, and O2  saturation  100% by 2 liters by nasal cannula.  (Per dialysis goal) dry weight equal  to 48 kg.  Current weight pending.  GENERAL:  She was alert and oriented x3, no apparent distress.  She  could move and speak easily without respiratory distress.  HEENT:  Her head was normocephalic and atraumatic.  Pupils were equal,  round, and reactive to light.  Extraocular muscles were intact.  Nares  were patent without discharge.  Dentition; the patient wears dentures.  Oropharynx without erythema or exudate.  NECK:  Supple without thyromegaly and no JVD.  However, she has a loud  bruit on the left side and a softer bruit on the right side.  HEART:  Rate regular with audible S1 and S2.  A 2/6 systolic murmur.  No  clicks, rubs, or gallops.  Pulses were 2+ and equal bilaterally in both  upper and lower extremities.  LUNGS:  Clear to auscultation bilaterally.  SKIN:  No rashes, lesions, or petechiae.  ABDOMEN:  Soft and nontender.  Normal abdominal bowel sounds.  No  rebound or guarding.  No hepatosplenomegaly.  Minimal pulsations in the  periumbilical region.  EXTREMITIES:  No clubbing, cyanosis, or edema.  MUSCULOSKELETAL:  No joint deformity or effusions.  BACK:  No spinal or CVA tenderness.  CHEST:  The patient does have a midline sternal scar and well-healed  scars on both the left and right upper chest from prior permanent  pacemaker placement.  The patient also has small lipomas like 2 x 4 cm  minimally raised lipoma on left chest.  NEUROLOGIC:  Cranial nerves II through XII grossly intact.  Strength 5/5  in all extremities and axial groups.  Normal sensation throughout and  normal cerebellar function.   RADIOLOGY:  Chest x-ray is pending.  EKG showed sinus rhythm with a rate  of 74 beats per minute with a left bundle-branch block.  No significant  changes from May 11, 2007 except for old EKG showed paced rhythm.  PR 196, QRS 186, and QTc 533.   LABORATORY DATA:  Pending.    ASSESSMENT AND PLAN:  This is a 77 year old female with a history of  coronary artery disease (status post coronary artery bypass graft and 2  times percutaneous coronary intervention), end-stage renal disease,  diastolic congestive heart failure, hypertension, dyslipidemia, carotid  artery disease, status post cerebrovascular accident, and hypothyroidism  presenting  with 1 week of worsening fatigue, dyspnea on exertion,  nausea, and anxiety significantly worse today.  The patient is currently  chest painfree.  Prior chest pain this week, no different from baseline.  Low suspicion for acute coronary syndrome.   Rule out acute coronary syndrome by admitting and cycling cardiac  enzymes, to check a complete metabolic panel, CBC, a BNP, TSH,  magnesium, and PT/INR. We will start heparin per pharmacy.  Continue her  home medications including metoprolol 12.5 mg p.o. b.i.d. and low-dose  aspirin and schedule her for dialysis tomorrow pending negative cardiac  enzymes.      Jarrett Ables, [facility]      Verne Carrow, MD  Electronically Signed    MS/MEDQ  D:  07/21/2008  T:  07/21/2008  Job:  [id number]

## 2010-10-16 NOTE — Cardiovascular Report (Signed)
[name], [name]                  ACCOUNT NO.:  [id number]   MEDICAL RECORD NO.:  [id number]          PATIENT TYPE:  INP   LOCATION:  6531                         FACILITY:  [facility]   PHYSICIAN:  Verne Carrow, MDDATE OF BIRTH:  [birth year]   DATE OF PROCEDURE:  07/22/2008  DATE OF DISCHARGE:                            CARDIAC CATHETERIZATION   PRIMARY CARDIOLOGIST:  Norman Herrlich, MD   PROCEDURE PERFORMED:  1. Left heart catheterization.  2. Selective coronary angiography.  3. Saphenous vein graft angiography.  4. Left ventricular angiogram.   OPERATOR:  Verne Carrow, MD   INDICATION:  A 77 year old Caucasian female with a history of coronary  artery disease status post three-vessel CABG in 2008, end-stage renal  disease on hemodialysis, CVA, hypertension, hyperlipidemia, bilateral  carotid artery disease and COPD who was admitted yesterday with  complaints of fatigue and dyspnea.  The patient was initially brought  emergently to the Cardiac Catheterization Laboratory as a code STEMI.  EMS pictured out and found that she had a wide complex rhythm and they  were concerned for an injury pattern.  Upon arrival, we reviewed her EKG  and found that she was in a ventricular paced rhythm and had no  complaints of chest pain.  The patient was admitted to a bed on the  floor and had serial cardiac enzymes cycled overnight.  Her troponin did  trend up to 1.3 overnight.  She had no complaints of shortness of breath  or chest pain during her hospitalization.  Her EKG remained paced  throughout the night.  We decided to proceed to a diagnostic left heart  catheterization today with her elevation in cardiac enzymes and prior  cardiac history.   PROCEDURE IN DETAIL:  The patient was brought into the Inpatient Cardiac  Catheterization Laboratory after signing informed consent for the  procedure.  The right groin was prepped and draped in a sterile fashion.  Lidocaine 1% was used  for local anesthesia.  A 5-French sheath was  inserted into the right femoral artery without difficulty.  Standard  diagnostic catheters were used to perform selective angiography of the  left coronary system and the right coronary artery.  The JR-4 catheter  was used to selectively engage the saphenous vein graft to the LAD and  the saphenous vein graft to the obtuse marginal.  An RCB catheter was  used to selectively engage the saphenous vein graft to the right  coronary artery.  A 4-French pigtail catheter was used to cross the  aortic valve into the left ventricle.  Following the performance of a  left ventricular angiogram, the catheter was pulled back across the  aortic valve with no significant pressure gradient measured.  The  patient tolerated the procedure well and was taken to the holding area  in stable condition.   ANGIOGRAPHIC FINDINGS:  1. The left main coronary artery had no significant disease noted.  2. The left anterior descending had plaque disease in the proximal      portion and was 100% occluded in the mid segment just beyond a  prior stented segment.  The first diagonal was a moderate size      branch that had plaque disease only.  The second diagonal arose      just distal to the stented segment at the area of 100% occlusion.      There was a faint filling of the second diagonal branch with      injection of the native left system.  The mid and distal LAD as      well as the second diagonal filled briskly from the saphenous vein      graft injection.  There was a large septal perforator that had an      80% ostial stenosis.  3. Circumflex artery had plaque disease in the proximal portion and a      100% occlusion in the proximal portion.  The mid and distal      circumflex as well as the first and second obtuse marginal branches      filled from the saphenous vein graft.  4. The right coronary artery had a 100% proximal occlusion.  The mid      and distal  right coronary artery as well as the posterior      descending artery filled from the saphenous vein graft.  5. The saphenous vein graft to the mid LAD was patent.  6. The saphenous vein graft to the first obtuse marginal was patent.      Once again, this filled the bifurcating first obtuse marginal      branch briskly and faintly filled the mid and distal circumflex as      well as a second obtuse marginal branch.  7. The saphenous vein graft to the PDA was patent.  This filled the      mid and distal right coronary artery and the posterior descending      artery briskly.   Central aortic pressure 122/53, LV pressure 126/9 and end-diastolic  pressure 15.   Left ventricular angiogram ejection fraction 45%.  There was apical and  inferoapical hypokinesis noted.   IMPRESSION:  1. Severe triple-vessel native coronary artery disease.  2. Status post three-vessel coronary artery bypass grafting with 3/3      patent bypass grafts.  3. Mild segmental left ventricular dysfunction.   RECOMMENDATIONS:  I recommend continued medical management.  Given the  patient's mild elevation in troponin, I feel that she would benefit from  daily Plavix therapy.  This will be continued along with 325 mg of  aspirin on a daily basis.  The patient will call Dr. Hulen Shouts office to  arrange a followup appointment within the next 2 weeks.  The patient  will be discharged to home after bedrest tonight as she is adamant about  leaving.  Her only complaint during the hospitalization has been that  she does not like the hospital bed.  I do not see any immediate  indication to monitor her tonight.  She is given instructions that if  she has any change in her clinical status or any signs of bleeding in  her leg or severe pain in her leg that she should return to the  emergency department immediately.      Verne Carrow, MD  Electronically Signed     CM/MEDQ  D:  07/22/2008  T:  07/23/2008  Job:   [id number]   cc:   Norman Herrlich, MD

## 2010-10-16 NOTE — Assessment & Plan Note (Signed)
OFFICE VISIT   [name], [name]  DOB:  06-15-1932                                       05/06/2007  ZOXWR#:[id number]   OFFICE VISIT:  The patient returns for follow-up today after revision of  her left upper arm arteriovenous fistula.  She had had aneurysmal  degeneration of the center segment of this.  This was replaced with a  PTFE graft.  They are currently using the proximal portion of the  fistula.  She is now almost four weeks out from her operation.  They  should be able to use the entire fistula and interposition graft at this  time.  I have written her a note to take to her dialysis center to start  this next week.  On examination today, all of her incisions are healed.  Her sutures were removed today.  She has an easily palpable thrill  throughout the fistula and over the segment of interposition graft.  She  will follow up on an as-needed basis.   Janetta Hora. Fields, MD  Electronically Signed   CEF/MEDQ  D:  05/06/2007  T:  05/07/2007  Job:  [id number]   cc:   Garnetta Buddy, M.D.

## 2010-10-16 NOTE — Consult Note (Signed)
NAME:  [name]                  ACCOUNT NO.:  [id number]   MEDICAL RECORD NO.:  [id number]          PATIENT TYPE:  INP   LOCATION:  5506                         FACILITY:  [facility]   PHYSICIAN:  Jesse Sans. Wall, MD, FACCDATE OF BIRTH:  [birth year]   DATE OF CONSULTATION:  05/12/2007  DATE OF DISCHARGE:                                 CONSULTATION   We were asked by Dr. Lowell Guitar of nephrology service to consult on [name] for coronary artery disease and congestive heart failure.   HISTORY OF PRESENT ILLNESS:  [name] is a 77 year old white female who  has been seen by [place] on previous hospitalizations.   Her cardiac history is significant for coronary artery disease, status  post three-vessel coronary artery bypass grafting on February 13, 2007,  at [facility].  She had had a previous stent in 1999  at [facility].  She had a non-STEMI with a stent to the LAD in 2001 in  [place].   She was last admitted here with some congestive heart failure.  It was  felt to be acute on chronic diastolic heart failure.   On the day of admission she became suddenly short of breath and was  admitted.  Her chest x-ray showed mild congestive heart failure with a  BNP of 2824.   She was dialyzed this morning and feels like she is ready to go home.   PAST MEDICAL HISTORY:  1. Her last 2-D echocardiogram on April 13, 2007, showed an EF of      50-55% with mild left ventricular hypertrophy, mitral annular      calcification and mild mitral regurgitation.  There was no      significant valvular disease otherwise.  2. She has a history of hypertension.  3. She has end-stage renal disease on hemodialysis in [place] on      Tuesdays, Thursdays and Saturdays.  She had not missed a dialysis,      day, by the way.  4. She has secondary hyperparathyroidism.  5. She has a history of hypothyroidism, status post thyroidectomy.      TSH is pending.  6. She had an upper GI bleed in  October 2008.  7. Status post permanent pacemaker placement.   She has had a hysterectomy and a hemorrhoidectomy in the past.   She is allergic to IVP DYE.   MEDICATIONS PRIOR TO ADMISSION:  1. __________  30 mg q.h.s.  2. PhosLo three t.i.d.  3. Lipitor 80 mg q.h.s.  4. Levothyroxine 150 mcg a day.  5. Nephro-Vite.  6. Omeprazole 20 mg p.o. q.h.s.  7. Metoprolol 12.5 mg b.i.d.  8. Colace 100 mg b.i.d.  9. Aspirin 81 mg a day.   SOCIAL HISTORY:  She lives in [place] alone.  She has four children.  She is a widow.  She has not smoked since September 2008.  Does not  drink.  She is retired.  She had a fifth grade education.  She was in  plastic recycling.   FAMILY HISTORY:  Really noncontributory.  REVIEW OF SYSTEMS:  She does report a weight change of 20 pounds since  September 2008.  She bruises very easily.  She wears reading glasses.  She has dentures.  She has had numbness in the left lower extremity  postop bypass surgery.  She has chronic abdominal pain.  Her review of  systems otherwise is negative.   EXAM:  Her blood pressure is currently 122/65, her pulse is 60 and she  is paced.  She is in sinus rhythm with ventricular pacing specifically.  Her temperature is 98.4, respiratory rate is 18, temperature is 98.2.  Saturation is 96% on 1.5 L.  GENERAL APPEARANCE:  She looks stronger than I have seen her on previous  admissions.  Her I&O this morning were 120/2300 post dialysis.  In  general, she is in no acute distress.  Skin is warm and dry.  HEENT:  Normocephalic, atraumatic.  PERRLA.  Extraocular movements  intact.  Sclerae are clear.  Facial symmetry is normal.  She wears  dentures.  NECK:  Supple.  There is minimal JVD.  Thyroid is not enlarged.  Trachea  is midline.  LUNGS:  Clear to auscultation.  HEART:  A nondisplaced PMI.  She has a systolic murmur at the apex.  S2  splits paradoxically.  There is no gallop.  SKIN:  A few bruises.  ABDOMEN:  Soft, good  bowel sounds.  EXTREMITIES:  No cyanosis, clubbing or edema.  Pulses were present but  reduced.  MUSCULOSKELETAL:  Some chronic arthritic changes.  NEUROLOGIC:  Grossly intact.   LABORATORY DATA:  Otherwise hemoglobin of 12.7.  Potassium 3.7, BUN of  32, creatinine 6.7.  Point of care markers negative x1.  Calcium 8.9.   ASSESSMENT:  1. Acute on chronic diastolic heart failure.  She is obviously very      volume-sensitive.  She reports dropping her blood pressure with      dialysis at times.  It obviously did not take too much of a change      in volume to get her into significant trouble.  2. Coronary artery disease, currently stable.  We have requested the      records from [facility] for details.  3. Mild mitral regurgitation with murmur.  4. Status post permanent pacemaker, currently in sinus rhythm with      ventricular pacing.  5. Hypotension with dialysis.   RECOMMENDATIONS:  1. I would change her metoprolol to the lowest dose possible, perhaps      12.5 mg extended release daily.  I would hold the metoprolol on      dialysis days, which you are probably already doing.  2. Try to decrease her intravascular volume more with dialysis if her      blood pressure allows.  3. Check TSH.  4. Obtain records from [facility], which we have requested.   She follows with Dr. Dulce Sellar in [facility] with [facility].  We  will ask her to follow up with him post discharge.   Thank you very much for the consultation.      Thomas C. Daleen Squibb, MD, [facility]  Electronically Signed     TCW/MEDQ  D:  05/12/2007  T:  05/12/2007  Job:  [id number]

## 2010-10-16 NOTE — H&P (Signed)
NAMEVALLERY, MCDADE                  ACCOUNT NO.:  [id number]   MEDICAL RECORD NO.:  [id number]          PATIENT TYPE:  INP   LOCATION:  2115                         FACILITY:  [facility]   PHYSICIAN:  Wilber Bihari. Caryn Section, M.D.   DATE OF BIRTH:  1932/10/05   DATE OF ADMISSION:  03/17/2007  DATE OF DISCHARGE:                              HISTORY & PHYSICAL   CHIEF COMPLAINT:  Nausea, vomiting and diarrhea for 4 days.   HISTORY OF PRESENT ILLNESS:  This is a 77 year old Caucasian female with  an extensive past medical history that includes end-stage renal disease  (hemodialysis Tuesdays, Thursday and Saturdays at the [facility]), anemia, and secondary hyperparathyroidism, status post  parathyroidectomy.  The patient states that on Friday evening she began  having frequent diarrhea episodes with dark red stools.  She reported to  hemodialysis Saturday morning as scheduled.  However, she was only able  to undergo 2 hours of hemodialysis and had to sign off early secondary  to persistent diarrhea.  The patient went home, where she stated she had  a temperature.  Although no actual recording was taken, she did feel  warm.  She also had vomiting, with continued diarrhea and cold chills.  The patient states her emesis was also dark red.  This continued for the  remainder of the weekend and into the beginning of this week. Early this  morning, approximately 1 a.m., the patient stated she notified her  daughter, stating that she needed to go to the emergency room and could  not wait to see the doctor at dialysis today.  The patient went to  [facility] Emergency Room for evaluation, and her hemoglobin was  found to be at 5.8.  She was transferred to [facility] for  further care.   PAST MEDICAL HISTORY:  1. As a stated above.  2. Hypertension.  3. Coronary artery disease, status post coronary artery bypass      grafting in September 2008 at [facility].  4. CVA, requiring chronic Coumadin therapy.  5. Congestive heart failure.   MEDICATIONS:  1. Phenergan 12.5 mg p.o. q.8 h. p.r.n. nausea or vomiting.  2. Levothyroxine 150 mcg p.o. daily.  3. Darvocet-N 100, 1 p.o. q.4 h. p.r.n.  4. Metoprolol 25 mg 1/2 tab p.o. b.i.d.  5. Sensipar 30 mg p.o. daily.  6. NitroQuick 0.4 mg q.5 minutes x3 p.r.n. chest pain.  7. PhosLo 667 mg 3 tablets p.o. t.i.d. with meals.  8. Folic acid 1 mg p.o. b.i.d.  9. Hydralazine 50 mg p.o. t.i.d.  10.Norvasc 5 mg p.o. at bedtime.  11.Oxygen 2 liters nasal cannula p.r.n. at home.  12.Lipitor 80 mg q. evening.  13.Enteric-coated aspirin 81 mg daily.  14.Imdur 20 mg daily.  15.Coumadin 5 mg daily.   HEMODIALYSIS MEDICATIONS:  1. Epogen 5000 units IV q. hemodialysis treatment.  2. Zemplar 2 mcg IV q. hemodialysis treatment.  3. Venofer 50 mg IV q. Thursday with hemodialysis.  4. Heparin 2000 units IV with hemodialysis treatment.   ALLERGIES:  IV DYE.  HEMODIALYSIS PRESCRIPTION:  Frequency:  Tuesdays, Thursdays and  Saturdays.  Estimated dry weight 53 kg.  Length:  4 hours.  Access:  Left upper extremity AV fistula.   SOCIAL HISTORY:  The patient lives in a house alone.  She is retired.  She is widowed.  She has 4 children, 3 daughters and 1 son.  Her oldest  daughter is a diabetic.  Otherwise, her children are alive and well.  She has a 25 pack-year smoking history.  Denies any alcohol or illicit  drug use.  She has a 5th grade education.   FAMILY HISTORY:  The patient's mother passed at age 72 secondary to  diabetes mellitus.  The patient's father passed at age 43 secondary to  diabetes mellitus complications.  The patient has 4 brothers and 2  sisters, all that are alive and well per patient.   REVIEW OF SYSTEMS:  GENERAL:  The patient complains of fever, chills,  sweating, decreased appetite, and increased fatigue for the past 4 days.  She denies any adenopathy or weight change.  HEENT:  The patient has  reading glasses.  She also has full upper and lower dentures.  Otherwise  denies any headache, rhinorrhea, vertigo, photophobia.  DERMATOLOGIC:  Denies any rashes or lesions.  CARDIOPULMONARY:  The patient has had  some shortness of breath, with dyspnea on exertion, and a nonproductive  cough that is worse when lying flat.  She requires 2 pillows to sleep  since Saturday night which is new.  ENDOCRINE:  Denies polyuria,  polydipsia, or heat or cold intolerance.  GU: The patient has 2-3  episodes of nocturia nightly, which is normal for her.  She otherwise  denies any frequency, urgency, dysuria, hematuria, or renal colic.  NEUROPSYCHIATRIC:  The patient has had some weakness since the onset of  the nausea, vomiting, and diarrhea.  She is also has bilateral lower  extremity numbness surrounding bilateral medial incisions performed  during the patient's September 2008 CABG procedure from vein harvesting.  Otherwise denies any depression or anxiety.  MUSCULOSKELETAL:  The  patient states she has had left hip pain x approximately 1 month since  her CABG in September 2008 as well, not worse with movement. Episodes  occur intermittently and are described as an achiness.  Otherwise denies  any myalgia, arthralgia, joint swelling, or deformity.  GI: The patient  has had nausea, vomiting, diarrhea, hematemesis, and melena as above.  She also complains of left lower quadrant abdominal pain that is  intermittent and relieved with episodes of vomiting.  She otherwise  denies any dysphagia, odynophagia, heartburn, or reflux.   PHYSICAL EXAMINATION:  VITAL SIGNS:  Temperature 97.6, pulse 60,  respirations 23, blood pressure 105/43, oxygen saturation 98% on 4  liters nasal cannula.  GENERAL:  The patient is lethargic.  She awakens easily to verbal  stimuli.  She is alert and oriented x3.  She is quite pale.  HEENT:  Head is normocephalic and atraumatic.  EOMs are intact.  Sclera  is  clear.  The nares are patent, without discharge or edema.  Mucous  membranes are dry.  Dentition is in fair condition.  Oropharynx without  erythema or exudate.  NECK:  Thin, without lymphadenopathy, thyromegaly, bruit, or JVD.  CARDIOVASCULAR:  Regular rate and rhythm, with S1 and S2 present.  A  grade 2/6 murmur is also present.  No rubs, gallops, or clicks noted.  Pulses are 1+ and regular.  RESPIRATORY:  Few rales scattered throughout bilateral  lower lobes.  Otherwise, the patient is clear throughout.  DERMATOLOGIC:  No rashes or lesions.  ABDOMEN:  Soft, with slight left lower quadrant tenderness with deep  palpation.  She has positive bowel sounds in all 4 quadrants.  No  rebound or guarding present.  EXTREMITIES:  2+ bilateral lower extremity edema present.  Multiple  areas of ecchymosis are also present in  bilateral upper extremities.  HEMODIALYSIS ACCESS:  The patient has a left upper extremity AV fistula,  with positive thrill and bruit.  An aneurysm is noted to the superior  portion of the AV fistula.  MUSCULOSKELETAL:  The patient does have intermittent left hip achiness.  However, unable to reproduce at the time of examination, and range of  motion is intact.  Otherwise, no joint deformity, effusions, or spinal  tenderness.  NEUROLOGIC:  The patient is alert and oriented x3.  No asterixis  present.   DIAGNOSTIC RADIOLOGICAL EXAMINATIONS:  Chest x-ray obtained at [facility] revealed persistent cardiomegaly, bilateral small pleural  effusions, bilateral lower lobe atelectasis, mild interstitial  prominence, with questionable congestion versus edema.   LABORATORY DATA:  Also from [facility], WBC 8.2, hemoglobin 5.8,  hematocrit 17.6, platelets 255.  Sodium 144, potassium 5.5, chloride  100, CO2 22, BUN was 63, creatinine 5.36, glucose of 97, calcium 8.3.  PTT 39.7, PT 45.3, INR 4.4.   ASSESSMENT AND PLAN:  1. Gastrointestinal bleed.  The patient will be  kept n.p.o. except for      ice.  A GI consult will be called to [facility], as they are taking      unassigned patients today, and he patient does not have an      established GI [facility].  We will monitor her hemoglobin frequently      and continue transfusions of packed red blood cells.  She will also      be placed on a PPI therapy as well as antiemetic therapy, and will      also Hemoccult stools.  2. Anemia.  Please refer to #1.  The patient will be placed on Epogen      and continue Venofer therapy with hemodialysis treatments.  3. Shortness of breath, with pleural effusion and questionable edema      on chest x-ray.  The patient will get hemodialysis today with      increased ultrafiltration, especially since the patient will be      transfused packed red blood cells today secondary to her decreased      hemoglobin.  She will be placed on oxygen therapy as needed.  We      will obtain a follow-up chest x-ray after her hemodialysis      treatment is complete this evening.  4. End-stage renal disease.  The patient is a hemodialysis patient on      Tuesday, Thursday, Saturday at the [facility].  We will      continue her hemodialysis during her hospitalization per her      outpatient regimen.  She may require extra hemodialysis treatments      for additional volume removal.  5. Secondary hyperparathyroidism.  The patient will continue vitamin D      therapy with hemodialysis treatments.  Her Sensipar phosphate      binder therapy will be restarted once the patient is eating, which      will be determined by GI.  6. Hypertension.  The patient's medications will be currently placed  on hold secondary to hypotension at this time.  Medications will be      restarted as needed, with parameters to hold for future hypotension      once the patient is taking p.o. medications.  7. Cerebrovascular accident.  The patient's Coumadin therapy will be      on hold and definitely.   Will likely need to be discontinued      secondary to recurrent GI bleeding. Give FFP to normalize elevated      PY-INR  8. Coronary artery disease, status post coronary artery bypass graft.      The patient's aspirin is on hold at this time.      She will continue Imdur once eating with parameters.  9. Hypothyroidism.  The patient will continue her replacement therapy.   DISPOSITION:  The patient will return home once stable.      Tracey P. Sherrod, NP      Richard F. Caryn Section, M.D.  Electronically Signed    TPS/MEDQ  D:  03/17/2007  T:  03/18/2007  Job:  [id number]

## 2010-10-16 NOTE — Assessment & Plan Note (Signed)
OFFICE VISIT   [name], OUK  DOB:  Feb 28, 1933                                       07/28/2009  VHQIO#:[id number]   Date of procedure was June 27, 2009.  Procedure was done by Dr.  Myra Gianotti.   CHIEF COMPLAINT:  Bilateral claudication.   HISTORY OF PRESENT ILLNESS:  The patient is a 77 year old woman who has  a history of end-stage renal disease who was referred to [place] by Dr. Hyman Hopes  for claudication especially of the left leg.  She had severe pain in her  left hip and thigh and calf after walking less than 25 feet.  She was  referred for an angiogram which she had done on June 27, 2009 which  showed bilateral common iliac artery stenosis.  She had bilateral common  iliac  kissing stents placed by Dr. Myra Gianotti during this procedure and  postoperatively she had palpable pulses in bilateral lower extremities.  The patient states she had no further pain or claudication in the legs.  She states that if she sits for a long time, she has some pain in the  groin area but otherwise has no other symptoms.  She can walk.  She has  no numbness or tingling in the left leg.  She has good sensation and  motion.  She denies any rest pain.   PHYSICAL EXAMINATION:  Vital signs:  Heart rate was 82, saturations were  98 and respiratory rate was 12.  HEENT:  Grossly within normal limits.  She is a well-nourished, well-developed female in no acute distress.  She is on Plavix and aspirin.  Bilateral lower extremities were warm and  pink.  She has palpable DP and PT pulses bilaterally.  Her left groin  was soft and puncture wound was well healed.   She also has a left upper arm AV fistula which has a good thrill and  bruit in it.   IMPRESSION AND PLAN:  Left leg claudication status post bilateral common  iliac artery stents for bilateral common iliac artery stenosis with  palpable pulses in bilateral lower extremities.   Today's labs:  ABI on the right was 1.12;  on the  left was 1.15.   PLAN:  Follow up in 6 months with ABIs.  She will continue on her Plavix  and aspirin and she will be seen back in the [facility] clinic in 6 months as  well.   Della Goo, PA-C   Larina Earthly, M.D.  Electronically Signed   RR/MEDQ  D:  07/28/2009  T:  07/28/2009  Job:  [id number]   cc:   Jorge Ny, MD  Dr[facility]

## 2010-10-19 NOTE — Discharge Summary (Signed)
NAME:  [name], [name]                  ACCOUNT NO.:  [id number]   MEDICAL RECORD NO.:  [id number]          PATIENT TYPE:  INP   LOCATION:  5506                         FACILITY:  [facility]   PHYSICIAN:  Alvin C. Lowell Guitar, M.D.  DATE OF BIRTH:  Dec 28, 1932   DATE OF ADMISSION:  05/11/2007  DATE OF DISCHARGE:  05/13/2007                               DISCHARGE SUMMARY   ADMISSION DIAGNOSES:  1. Hyperkalemia.  2. Congestive heart failure, recurrent, with history of CABG in the      past, rule out ischemic mediated, volume excess, valvular disease.  3. End stage renal disease, chronic hemodialysis.  4. Anemic chronic disease.  5. Secondary hyperparathyroidism.   DISCHARGE DIAGNOSES:  1. Congestive heart failure with volume overload with acute on chronic      diastolic failure.  2. Hyperkalemia.  3. Coronary artery disease, status post CABG.  4. End-stage renal disease, chronic hemodialysis.  5. Secondary hyperparathyroidism.  6. Anemia of chronic disease and iron deficiency.   BRIEF HISTORY AND PHYSICAL:  [name] is a 77 year old white female  with end-stage renal disease (hemodialysis Tuesday, Thursday, Saturday  at [facility]), history of coronary artery disease status  post CABG, who presents complaining of shortness of breath to [facility]  Emergency Room. Chest x-ray showed congestive heart failure.  Her  potassium came back in the 7's.  Dr. Lowell Guitar admitted the patient.  Of  note, she had a VQ scan negative November 2008, a 2D echocardiogram  November 8 showed ejection fraction of 50%.  Denied any chest pain.   PHYSICAL EXAMINATION:  VITAL SIGNS:  By Dr. Lowell Guitar on admission showed,  blood pressure 153/73.  She was afebrile, pleasant and alert, white  elderly female.  LUNGS:  Revealed bilateral crackles.  CARDIAC:  Revealed regular rate and rhythm auscultated S2, S2.  No  murmur appreciated.  ABDOMEN:  Soft.  EXTREMITIES:  Revealed left upper arm AV Gore-Tex graft,  positive  bruits.   LABORATORY DATA:  Revealed abdominal chest x-ray showed interstitial  edema.  EKG showed left bundle branch block.  BNP was elevated 2824.  Potassium 7.6, repeat 6.2.  Dr. Lowell Guitar admitted the patient because of  her congestive heart failure, hyperkalemia for acute hemodialysis in the  setting of this patient's known history of coronary artery disease,  status post CABG, he wanted to rule out ischemia mediated failure versus  alveolar versus volume overload with Cardiology to help evaluate.   HOSPITAL COURSE:  #1 - THE PATIENT WAS ADMITTED WITH CONGESTIVE HEART  FAILURE AS NOTED ABOVE.  Seen in consult by [facility].  Enzymes  were negative.  She was given diagnosis of acute on chronic diastolic  congestive heart failure.  Dr. Lenard Forth recommended using metoprolol 12.5  mg daily, total 1000 stays to try to decrease her volume with more  hemodialysis, and she did have hemodialysis on admission.  This made her  somewhat better.  She was stable after hemodialysis, deemed ready for  discharge home with follow up with Dr. Lenard Forth as noted by Dr. Myrtis Ser on  January 10.  Dr. Lowell Guitar also cleared her to be discharged home with the  dry weight lowered by 1 kg to 48 kg.   #2 - HYPERKALEMIA/END STAGE RENAL DISEASE.  The patient's potassium  improved with hemodialysis.  Admission potassium was 71, improved to 3.7  near the day of discharge.  Will have follow up potassium taken as [facility].  Continue dialyzing same except 2.0 K bath.  She will  have follow up as per [facility] Tuesday, Thursday, Saturday as  scheduled.   #3 - ANEMIA.  The patient's hemoglobin on admission was 14.6.  That was  on I-stat.  She was continued on Venofer 100 mg with 10 total treatments  to be given as an outpatient, and then 150 mg q.weekly.  Epogen dose was  to 28,000 units as an outpatient.  Will need to follow hemoglobins as an  outpatient, and again noted that hemoglobin was 14.6 on  I-Stat.  Rechecked 12.78.  Will be decreasing her Epogen according to [facility]  protocol [facility].   #4 - SECONDARY HYPERPARATHYROIDISM.  The patient is continued on Zemplar  2 mcg.  At time of discharge, calcium 8.9, phosphorous 8.5 on admission.  She will be continued on phosphate binder as PhosLo, 3 each meal, and  Sensipar.  If her repeat calcium phosphorous as outpatient is elevated,  will hold her Zemplar.  Continue to check intact [facility].   DISCHARGE MEDICATIONS:  1. Sensipar 50 mg daily.  2. PhosLo three each meal.  3. Vytorin 80 mg daily.  4. Levothyroxine 150 mcg daily.  5. Nephrovite one daily.  6. Aspirin 81 mg daily.  7. Metoprolol 12.5 mg b.i.d.  8. In dialysis unit      a.     Iron Venofer 100 mg times 10 total and 50 mg q.weekly.      b.     Epogen 28,000 units q.dialysis.      c.     Her dry weight will be lowered to 48.0 kg.      d.     Continue oxygen 2 liters per nasal cannula as needed which       she has at home.   FOLLOWUP:  Follow up at [facility] on Tuesday, Thursday,  Saturday.   Discharge planning did take more than 30 minutes.      Donald Pore, P.A.      Mindi Slicker. Lowell Guitar, M.D.  Electronically Signed    DWZ/MEDQ  D:  06/30/2007  T:  07/01/2007  Job:  [id number]   cc:   [facility]  [facility]

## 2010-10-19 NOTE — Consult Note (Signed)
[place]. [facility]  Patient:    [name], [name]                         MRN: [id number] Adm. Date:  [id number] Attending:  Learta Codding Dictator:   Gwenlyn Found CC:         [facility]             Oley Balm. Charlann Boxer, M.D., [facility]             Marcene Corning, M.D.                          Consultation Report  REFERRING PHYSICIAN:  Lewayne Bunting, M.D.  HISTORY OF PRESENT ILLNESS:  [name] is a 77 year old white female with a history of hypertension, hyperlipidemia, hypothyroidism, and tobacco abuse who has chronic renal insufficiency of unknown etiology. She presented to [facility] on September 03, 1999, with complaints of chest pain x 45 minutes with diaphoresis and subsequently ruled in for an acute non-Q-wave MI (anterolateral with left bundle branch block). The patient was transferred to [facility] at her and her familys request to undergo left heart catheterization and to be followed by [facility] in light of her chronic renal insufficiency. The patient does report an episode of chest pain lasting 30 seconds on the night of September 05, 1999. She reports that this was left precordial, nonradiating, and was associated with some diaphoresis. She said that this was not like the pain that she experienced when she had her acute non-Q-wave MI.  PAST MEDICAL HISTORY:  1. Hypertension diagnosed in 1994/1995.  2. Chronic renal insufficiency diagnosed July of 2000 with baseline     creatinine in 2000 of 2.2 to 2.3.  3. Hypothyroidism.  4. Status post hysterectomy.  5. Status post hemorrhoidectomy at the age of 77.  6. Tobacco abuse.  7. Metabolic acidosis September 03, 1999.  8. Hypercholesterolemia.  9. Status post acute non-Q-wave MI, September 03, 1999. 10. EF of 25 to 30% by echo on September 06, 1999.  ALLERGIES:  No known drug allergies.  CURRENT MEDICATIONS:  As an outpatient, the patient was on:  1. Synthroid 0.112 mg 1/2  tab p.o. q.d.  2. Lasix 20 mg p.o. q.d.  3. Adalat 30 mg p.o. q.d.  Inpatient, the patient is on:  1. ______ cysteine 600 mg p.o. b.i.d.  2. Lopressor 25 mg p.o. b.i.d.  3. Lovenox 20 mg subcutaneous q.12h.  4. Integrilin drip 2.2 cc per hour.  5. Imdur 30 mg p.o. q.d.  6. Restoril 15 mg p.o. q.h.s. p.r.n.  7. Milk of magnesia 30 cc p.o. p.r.n.  8. Synthroid 0.066 mg p.o. q.d.  9. She is to start Zocor 20 mg p.o. q.d.  SOCIAL HISTORY:  She is married. She has four children. She works in a [employer], sport and exercise. Tobacco:  1/2 pack per day x 45 years. Alcohol:  None. Drugs: None.  FAMILY HISTORY:  Her mom died at the age of 20 from congestive heart failure. She also had diabetes and Alzheimers disease. Her father is living at the age of 12 and has had a myocardial infarction and underwent three-vessel CABG in 1972. She has two sisters, one with thyroid disease; one brother who has an artificial heart valve, one brother who had a stroke and is living. She has one brother who is living with colon cancer  and who had an MI. She has one brother who died of metastatic cancer.  REVIEW OF SYSTEMS:  Positive for chest pain on September 03, 1999 and September 05, 1999, diaphoresis, left frontal headache that she describes as a 9/10, and easy bruising. Negative for nausea, vomiting, fevers, chills, diarrhea, constipation, visual changes, hearing loss, paresthesias, hematuria, bright red blood per rectum, dysuria, or frequency.  PHYSICAL EXAMINATION:  VITAL SIGNS:  Temperature 97.4, blood pressure 164/83, pulse of 50, respiratory rate 16, O2 saturation 95% on room air, her weight on April 4 was 55.7 kg.  GENERAL:  Alert, pleasant, well-developed, well-nourished white female in no acute distress who looks her stated age.  SKIN:  Warm and dry.  NECK:  Supple with no lymphadenopathy, thyromegaly, or JVD. There is a small palpable left thyroid nodule.  HEENT:  Normocephalic, atraumatic. PERRLA.  EOMI. Sclerae anicteric. Oropharynx shows upper and lower plates of dentures. No erythema or exudate. Nares are patent.  LUNGS:  Clear to auscultation bilaterally.  HEART:  Regular rate and rhythm. Normal S1/S2. I cannot appreciate any murmurs, rubs, or gallops.  ABDOMEN:  Soft, nontender, nondistended with normoactive bowel sounds and no hepatosplenomegaly.  EXTREMITIES:  There is no edema. There are 2+ bilaterally pulses throughout.  NEUROLOGICAL:  Cranial nerves 2 through 12 are grossly intact. Sensory is intact. Motor is 5/5 throughout. The toes are downgoing bilaterally.  LABORATORY DATA:  Sodium 138, potassium 4.3, chloride 109, CO2 23, BUN 37, creatinine 2.7, glucose 95, calcium 8.6 on September 05, 1999. The patient did have a CBC on September 06, 1999, that showed a platelet count of 228, hemoglobin of 11.3, and white blood cells 6.7.  A 2-D echocardiogram on September 06, 1999, showed an EF of 25 to 30% with regional wall motion abnormalities, mild left ventricular enlargement, mild left ventricular hypertrophy, mild left atrial enlargement, and trace mitral regurgitation and trace tricuspid regurgitation.  ASSESSMENT:  [name] is a 77 year old white female with chronic renal insufficiency and hypertension who is status post acute non-Q-wave myocardial infarction on September 03, 1999. She is to undergo left cardiac catheterization on September 07, 1999, to better evaluate her coronary arteries. Because of her chronic renal insufficiency with a creatinine of 2.7, it is advisable to continue her IV fluid precatheterization and postcatheterization, overnight or for approximately 24 hours, give her ______  cysteine at a dose of 600 mg b.i.d. as currently ordered and repeat a BMP post catheterization.  RECOMMENDATIONS:  1. Status post non-Q-wave MI:  Left heart catheterization planned for September 07, 1999 with limited dye load. The patient is to receive IV fluids and      ______  cysteine  prior to and post procedure. Continue Imdur,     Lovenox, Lopressor, and Integrilin per cardiology.  2. Chronic renal insufficiency:  IV fluids and inositol cysteine as     previously described. Repeat BMP in a.m. on September 07, 1999 and on September 08, 1999.  3. Hyperlipidemia:  The patient is to start Zocor per cardiology.  4. Hypertension:  Continue Lopressor and Imdur. May resume Adalat and Lasix     post catheterization.  5. Tobacco abuse:  Encourage cessation.  6. Hypothyroidism:  Continue Synthroid at home dose. DD:  09/06/99 TD:  09/06/99 Job: [id number]

## 2010-10-19 NOTE — Discharge Summary (Signed)
NAME:  [name], [name]                  ACCOUNT NO.:  [id number]   MEDICAL RECORD NO.:  [id number]          PATIENT TYPE:  INP   LOCATION:  5506                         FACILITY:  [facility]   PHYSICIAN:  Alvin C. Lowell Guitar, M.D.  DATE OF BIRTH:  1933/02/04   DATE OF ADMISSION:  05/11/2007  DATE OF DISCHARGE:  05/13/2007                               DISCHARGE SUMMARY   ADMISSION DIAGNOSES:  1. Hyperkalemia.  2. Congestive heart failure, rule out ischemic heart disease versus      volume excess.  3. End-stage renal disease on chronic hemodialysis.   DISCHARGE DIAGNOSES:  NO FURTHER DICTATION      Donald Pore, P.A.      Mindi Slicker. Lowell Guitar, M.D.  Electronically Signed    DWZ/MEDQ  D:  06/25/2007  T:  06/25/2007  Job:  [id number]

## 2010-10-19 NOTE — Discharge Summary (Signed)
NAMESEDONA, Maria [name]                  ACCOUNT NO.:  [id number]   MEDICAL RECORD NO.:  [id number]          PATIENT TYPE:  INP   LOCATION:  3740                         FACILITY:  [facility]   PHYSICIAN:  Jesse Sans. Wall, MD, FACCDATE OF BIRTH:  15-Jul-1932   DATE OF ADMISSION:  04/11/2007  DATE OF DISCHARGE:  04/16/2007                               DISCHARGE SUMMARY   PRIMARY CARDIOLOGIST:  Dr. Norman Herrlich in [place], [place].   RENAL:  The patient is followed by [facility].   DISCHARGING DIAGNOSES:  1. Congestive heart failure, diastolic in nature, status post 2-D      echocardiogram this admission with an EF of 50%.  2. End-stage renal disease, dialysis on Tuesdays, Thursdays at the      [facility].   PAST MEDICAL HISTORY:  1. Gastroesophageal bleeding secondary to duodenal ulcer in 2008.  2. CVA requiring Coumadin in the past.  3. Anemia.  4. Hypertension.  5. Hypothyroidism.  6. End-stage renal disease requiring dialysis.  7. Coronary artery disease status post CABG in September 2000 at [facility].  8. Status post pacemaker secondary to history of bradycardia.  9. History of abdominal aortic aneurysm.  10.History of secondary hyperparathyroidism.  11.Hysterectomy.  12.Allergy to IVP dye.  13.History of CHF, secondary to ischemic cardiomyopathy with [name]      previous EF of 25-30% in 2001.  14.History of right groin pseudoaneurysm.  15.AV fistula, status post cardiac catheterization in 2001.  16.History tobacco use.   HOSPITAL COURSE:  Maria [name] is [name] 77 year old female with past medical  history as stated above, who presented to an outside hospital with  complaints of shortness of breath.  At the outside hospital, the patient  was treated with aspirin, nitroglycerin, Lasix IV and transported to  [facility] for further evaluation.  Initial  evaluation at outside hospital, it was felt that patient would  need to  be evaluated for emergent hemodialysis.  Therefore the transport to  [facility]. [facility].  She was seen initially by Dr. Joaquim Lai.  Baseline blood work was obtained.  EKG showed ventricular  pacing.  First set of cardiac markers troponin 0.07.  BNP was 2900.  The  patient was admitted to telemetry.  Renal was asked to consult and  evaluate the patient seen day.  The patient underwent dialysis also on  April 11, 2007.  Later that day, swelling noted around left upper arm  AV fistula.  Dr. Darrick Penna asked to evaluate.  The patient underwent  revision of left upper arm AV fistula on April 14, 2007.  The patient  continued to receive volume management through hemodialysis.  Underwent  echocardiogram that showed EF 50-55%.  The patient underwent  hemodialysis again on April 15, 2007.  Dr. Darrick Penna continued to assess  fistula.  No problems.  Post dialysis, Coumadin continued to be held.  Dr. Daleen Squibb in to see the patient on April 16, 2007.  Patient stable for  discharge home from cardiology.  No  Coumadin.  The patient to follow up  with Dr. Dulce Sellar in two weeks.  The patient agrees to call for  appointment.  Follow up with Dr. Darrick Penna in two weeks, office will  arrange for Dr. Darrick Penna.  The patient discharged home on renal diet with  4-5 eight ounce cups daily of liquid.   DISCHARGE MEDICATIONS:  At time of discharge the patient'Maria medications  include  1. Sensipar 30 mg daily.  2. PhosLo three times daily with meals.  3. Lipitor 80 at bedtime.  4. Levothyroxine 150 mcg daily.  5. Nephro-Vite daily.  6. Prilosec 20 at bedtime.  7. Metoprolol 12.5 b.i.d., hold on morning of dialysis.  8. Aspirin 81 mg daily.  9. Resume hemodialysis at [place], [place].  10.Nitroglycerin as needed for chest pain.   DURATION OF DISCHARGE ENCOUNTER:  Greater than 30 minutes.      Dorian Pod, ACNP      Jesse Sans. Daleen Squibb, MD, [facility]  Electronically Signed     MB/MEDQ  D:  06/16/2007  T:  06/17/2007  Job:  [id number]

## 2010-10-19 NOTE — Op Note (Signed)
[place]. [facility]  Patient:    [name], [name]                         MRN: [id number] Proc. Date: 03/27/00 Adm. Date:  [id number] Attending:  Bennye Alm                           Operative Report  PREOPERATIVE DIAGNOSIS:  Chronic renal failure.  POSTOPERATIVE DIAGNOSIS:  Chronic renal failure.  PROCEDURE: 1. Placement of left upper arm arteriovenous fistula. 2. Right internal jugular Ash catheter.  SURGEON:  Di Kindle. Edilia Bo, M.D.  ASSISTANT:  Loura Pardon, P.A.  ANESTHESIA:  Local with sedation.  DESCRIPTION OF PROCEDURE:  The patient was taken to the operating room and sedated by anesthesia.  The left upper extremity was prepped and draped in the usual sterile fashion.  After the skin was infiltrated with 1% lidocaine, an incision was made at the antecubital level.  Here the brachial artery was dissected free beneath the fascia.  The cephalic vein was dissected free and was a good-caliber vein.  It branched, and I divided both branches and irrigated it nicely with heparinized saline and then the patient was systemically heparinized.  The brachial artery was clamped proximally and distally, and a longitudinal arteriotomy was made.  The vein was opened between the two branches, providing a wide anastomosis, and this was done end-to-side to the brachial artery using continuous 6-0 Prolene suture.  At the completion, there was an excellent thrill in the fistula.  Hemostasis was obtained in the wound, and the wound was closed with a deep layer of 3-0 Vicryl and the skin closed with 4-0 Vicryl.  A sterile dressing was applied.  Next, attention was turned to placement of a right IJ Ash catheter.  The neck and upper chest were prepped and draped in the usual sterile fashion.  After the skin was anesthetized with 1% lidocaine, the right internal jugular vein was cannulated and the guidewire introduced into the superior vena cava.   A track for the other site of the catheter was chosen, and the skin anesthetized between the two areas.  The catheter was passed between the two incisions with the cup positioned in the midportion of the tunnel.  The tract over the wire was dilated and then the dilator and peel-away sheath were passed over the wire, and wire and dilator were then removed.  The catheter was passed through the peel-away sheath and positioned in the right atrium.   Both ports withdrew easily, were then flushed with heparinized saline and filled with concentrated heparin.  The catheter was secured at its exit site with a 3-0 nylon suture. The IJ cannulation site was closed with a 4-0 subcuticular stitch.  A sterile dressing was applied.  The patient tolerated the procedure well, was transferred to the recovery room in satisfactory condition.  All needle and sponge counts were correct. DD:  03/27/00 TD:  03/27/00 Job: [id number]

## 2010-10-19 NOTE — Discharge Summary (Signed)
[place]. [facility]  Patient:    [name], [name]                         MRN: [id number] Adm. Date:  [id number] Attending:  Learta Codding CC:         Cecil Cranker, M.D. [facility]                  Referring Physician Discharge [place]  CARDIOLOGIST:  Cecil Cranker, M.D. [facility]  PROCEDURES PERFORMED:  Selective coronary angiography.  DIAGNOSES:  Multivessel coronary artery disease.  BRIEF HISTORY:  [name] is a 77 year old white female with a history of recent non-Q-wave myocardial infarction.  The patient also underlying renal dysfunction, with a creatinine of 2.6.  The patient had a 2d echocardiographic study done at [facility]; revealed moderate LV dysfunction, with an ejection fraction of 35%, as well as inferior and posterior akinesis.  The patient is referred for diagnostic catheterization to assess her coronary anatomy.  DESCRIPTION OF PROCEDURE:  After informed consent was obtained, the patient was brought to the catheterization laboratory.  The right groin was sterilely prepped and draped.  Lidocaine 1% was used to infiltrate the right groin, and a 6-French arterial sheath was placed using the modified Seldinger technique. Then 6-French JL4 and JR4 catheters were used to engage the left and right coronary arteries.  Selective coronary angiography was performed in various projections using manual injections of contrast.  Of note was that the patient had separate ostia for LAD and circumflex, but the circumflex artery was opacified well.  Due to her underlying renal insufficiency, no selective angiography of the circumflex was performed.  No ventriculogram was performed due to her underlying renal dysfunction.  At the termination of the case, the catheter and sheath were removed.  Manual pressure was applied until adequate hemostasis was achieved.  The patient tolerated the procedure well and was transferred to the Floor in stable  condition.  FINDINGS:  SELECTIVE CORONARY ANGIOGRAPHY:  Of note was that the LAD and circumflex has separate ostia.  1. LEFT ANTERIOR DESCENDING ARTERY:  Had significant calcifications in its    proximal and mid segment of the vessel.  There was a focal concentric    stenosis of approximately 99% in the mid vessel, prior to third diagonal.    Three diagonal vessels are arising from the LAD, which essentially have no    flow-limiting coronary artery disease. 2. LEFT CIRCUMFLEX CORONARY ARTERY:  A large vessel with two large obtuse    marginal branches.  The circumflex proper has a focal 70% stenosis in its    distal segment, but this is a small-caliber vessel. 3. RIGHT CORONARY ARTERY:  A small-caliber vessel that is diffusely diseased    throughout its course.  There is a more focal stenosis of approximately 80%    in the mid vessel.  CONCLUSION:  Multivessel coronary artery disease with focal stenosis of 99% in the mid LAD.  Diffusely diseased right coronary artery.  RECOMMENDATIONS:  The findings of the procedure were reviewed with Dr. Riley Kill.  It was felt that the LAD lesion was amenable to rotational atherectomy, due to the fact that the LAD vessel was significantly calcified.  The right coronary artery is a diffusely diseased vessel and is not favorable to percutaneous coronary intervention.  Due to the fact that the patient has significant underlying renal disease, it was felt that she would benefit  from a stage procedure; to be done on Monday (September 10, 1999).  The patient will be aggressively hydrated, post-diagnostic catheterization. She will also receive another four doses of Muco-Myst starting Sunday, to hopefully prevent contrast nephropathy. DD:  09/07/99 TD:  09/09/99 Job: [id number]

## 2010-10-19 NOTE — Cardiovascular Report (Signed)
[place]. [facility]  Patient:    [name], [name]                           MRN: [id number] Proc. Date: 09/10/99 Attending:  Veneda Melter, M.D. [facility] CC:         Cardiac Catheterization Laboratory             Lewayne Bunting, M.D. [facility] - Marcene Corning, M.D.             Dr. Phineas Real - Rosalita Levan, [place]             Oley Balm. Charlann Boxer, M.D. - Cletus Gash, M.D.                        Cardiac Catheterization  PROCEDURES PERFORMED: 1. Selective coronary angiography. 2. Rotational atherectomy. 3. Percutaneous coronary angioplasty. 4. Stent placement in the mid-left anterior descending coronary artery.  DIAGNOSIS:  Severe single-vessel coronary artery disease.  CARDIOLOGIST:  Veneda Melter, M.D.  INDICATIONS:  [name] is a 77 year old white female who recently presented with chest pain and ruled in for a non-Q-wave myocardial infarction.  The patient has moderate LV dysfunction by echocardiogram and underwent a left heart catheterization on September 07, 1999, showing a severe stenosis of greater than 90% in the midsection of the LAD.  Due to chronic renal insufficiency, the patient presents today for a staged intervention to the LAD.  DESCRIPTION OF PROCEDURE:  After informed consent was obtained the patient was brought to the catheterization laboratory where both groins were sterilely prepped and draped.  Lidocaine 1% was used to infiltrate the right groin and an 8-French sheath placed in the right femoral artery using a modified Seldinger technique.  The patient had previously been on aspirin, Plavix, and heparin, and she was given Integrilin on a weight-adjusted basis to maintain an ACT of greater than 250 seconds.  An 8-French JL4 guide catheter was then used to engage the left coronary artery, and selective guide shots obtained using manual injections of contrast. A 0.014 inch Roto-Gold wire was then advanced in the distal LAD.  A rotational atherectomy was performed with  a 1.5 mm bur.  Three passes at 160,000 rpms were  performed for 20 seconds each.  A minor amount of calcium was visible on the angiogram, and was palpable during atherectomy.  This was followed by a 2.0 mm ur with three passes performed at 160 rpms.  A repeat angiography showed a correction of the 90% narrowing to approximately 60%.  A 3.0 mm x 15.0 mm Quantum Ranger balloon was then introduced, and two inflations performed at 8 atmospheres and 2 atmospheres for 30 seconds each within the mid-LAD.  The 3.0 mm x 15.0 mm Quantum Ranger balloon was then introduced over the Roto-Gold wire, and this was then exchanged for a Hi-Torque floppy wire.  A 3.05 mm x 15.0 mm NIR Royale stent was then carefully positioned under cine angiography in the mid-LAD, to encompass the stenosis but to avoid encroaching upon the bifurcation with a large second diagonal branch.  This was then deployed at 6 atmospheres for 45 seconds.  A slight distal waist was noted in the LAD proper, and a repeat inflation was performed after advancing the balloon slightly at 6 atmospheres for 60 seconds.  A repeat angiography was performed after the administration of intercoronary nitroglycerin, showing an excellent result, with no residual stenosis and TIMI-3 flow throughout  the LAD system.  A final angiography was then performed in various projections, confirming that no vessel damage had occurred, and there was TIMI-3 flow throughout the LAD system.  The guide catheter was then removed and the sheath secured into position.  The patient was transferred to the floor in stable condition.  FINAL RESULTS:  Successful rotational atherectomy and stenting of the mid-left anterior descending coronary artery, with reduction of a greater than 90% narrowing to 0%, with the placement of a 3.5 mm x 15.0 mm NIR Royale stent. DD:  09/10/99 TD:  09/11/99 Job: [id number]

## 2010-10-19 NOTE — H&P (Signed)
[place]. [facility]  Patient:    [name]                         MRN: [id number] Adm. Date:  [id number] Disc. Date: [id number] Attending:  Almetta Lovely                         History and Physical  REASON FOR ADMISSION:  Acute on chronic renal insufficiency.  HISTORY OF PRESENT ILLNESS:  This is a 77 year old white female with a history of chronic renal insufficiency, presumably secondary to hypertensive nephrosclerosis, who came to the office today complaining of poor appetite and excessive fatigue. She was seen on March 06, 2000 by Dr. Lowell Guitar at the [facility] office, who at that time found her to be slightly volume overloaded and increased her Lasix to 160 mg b.i.d. Renal function, at that time, showed a serum creatinine of 3.7 with a BUN of 71. She returned one week later on March 13, 2000, with no improvement in her volume status and Zaroxolyn at 5 mg a day was added. The decision about an admission to the hospital was discussed with her, at that time. However, she did no want to be admitted to the hospital. She returns today complaining of no significant change in urine output despite the higher diuretics. As a matter of fact, she says she is making less urine and now feels excessively fatigued with poor appetite. Her weight is down only 4 pounds despite the increased diuretics. The only new medication she has been on is what sounds like Zithromax for bronchitis, given by her primary doctor two weeks ago. She finished this up last week.  On review of her serum creatinine data, her serum creatinine was 2.0 in May 2000, 3.4 in April 2001, 3.6 in June 2001, 3.1 in August 2001, 3.7 on March 06, 2000, and now is 5.4 with a BUN of 126.  PAST MEDICAL HISTORY: 1. Hypertension. 2. Chronic renal insufficiency. 3. Hypothyroidism. 4. Coronary artery disease, she is status post stent placed in April 2001. Of    note is the fact that she had a  stress test done September 2001, which was    unremarkable, and showed an ejection fraction of 63%.  ALLERGIES:  None.  MEDICATIONS:  1. Synthroid 0.125 mg a day.  2. Ferrous sulfate 325 mg b.i.d.  3. Clonidine 0.2 mg b.i.d.  4. Isosorbide dinitrate 20 mg q.d.  5. Hydralazine 50 mg t.i.d.  6. Lipitor 40 mg a day.  7. Enteric-coated aspirin one a day.  8. Norvasc 5 mg a day.  9. Lasix 160 mg b.i.d. 10. Procrit 5000 units every other week. 11. K-Dur 10 mEq a day.  SOCIAL HISTORY:  She is an ex-smoker, smoked half a pack per day for 40 years, quit April 2001. She denies alcohol use. She is married and lives in [place]. She used to work for [employer].  FAMILY HISTORY:  Significant for colon cancer in one brother. Another brother also died of cancer of unknown type. Father died of an MI at age 52. Mother had diabetes.  REVIEW OF SYSTEMS:  No recent change in vision. She has some dyspnea on exertion, but no overt PND or orthopnea. She denies any chest pains or chest pressures. No recent change in bowel habits. She complains of burning pain in both feet over the last few weeks. She denies any joint pains.  She denies any skin rashes.  PHYSICAL EXAMINATION:  VITAL SIGNS:  Blood pressure 142/84, pulse 84, weight 138 pounds.  GENERAL:  A 77 year old white female in mild distress secondary to fatigue.  HEENT:  Sclerae not icteric. Extraocular motions are intact. There is some conjunctival erythema bilaterally.  NECK:  Positive HGR.  LUNGS:  A few faint basilar crackles with scattered wheezes.  HEART:  Regular without murmur, rub or gallop.  ABDOMEN:  Positive bowel sounds. Nontender, nondistended, no hepatosplenomegaly. There is some very mild subcutaneous edema.  EXTREMITIES:  Trace to 1+ edema that extends up into the thighs. There is negative Homans sign. I do not feel any palpable cords. There is no palpable tenderness of either lower extremity.  NEUROLOGIC:  Some mild  asterixis.  IMPRESSION:  Acute on chronic renal insufficiency. Obvious reason would be "overdiuresis," but she is still volume overloaded at this time. Her weight is down only 4 pounds, and she has noticed no significant increase in urine output despite the increased diuretics. A possible etiology would be acute interstitial nephritis secondary to Zithromax, although I obtained no other allergic-type symptoms. This simply may be progression of her underlying disease, as well.  PLAN:  Admit her to 5500 and see if there are reversible causes. Will hold her diuretics for now, but I suspect she will need diuretics to prevent more volume overload. Next time will check a chest x-ray, check her urinalysis, check  renal ultrasound to make sure there is no obstruction to urine outflow. I did discuss with her the possibility of hemodialysis. DD:  03/20/00 TD:  03/20/00 Job: [id number]

## 2010-10-19 NOTE — Discharge Summary (Signed)
[place]. [facility]  Patient:    [name], [name]                         MRN: [id number] Adm. Date:  [id number] Disc. Date: 11/12/99 Attending:  Learta Codding Dictator:   Rozell Searing, P.A. CC:         Dr. Bing Matter of [place]                           Discharge Summary  PROCEDURE:  Adenosine Cardiolite.  REASON FOR ADMISSION:  Ms. Snook is a 77 year old female with well-documented CAD, notable for recent non-Q MI/subsequent HSRA/stent LAD April 2001 who initially presented to [facility] with recurrent chest pain and was transferred to [facility] for further evaluation. Initial cardiac enzymes were negative for MI.  LABORATORY DATA:  CPK/MB negative. Troponin I less than 0.03. Normal CBC. Metabolic profile:  Elevated potassium 5.4, elevated BUN 41, creatinine 2.7; potassium 5.1, BUN 45, creatinine 3.1 at discharge.  HOSPITAL COURSE:  Following transfer from [facility], the patient ruled out for MI with additional serial cardiac enzymes. She was seen by Lewayne Bunting, M.D. who is familiar with the patients cardiac history and recommended adjustment of medication regimen with the addition of Norvasc and Lasix. Recommendation was to proceed with noninvasive approach, including an adenosine Cardiolite.  This was performed on June 10 and revealed no evidence of ischemia, probable apical/anteroseptal breast attenuation, calculated EF 38%.  The patient had no recurrent chest discomfort, and her dyspnea was improved following diuresis. Given her mild increase in creatinine at time of discharge, recommendation was to scale back the Lasix from 80 to 40 mg q.d. The patient will need early follow-up BMP for further monitoring of potassium and renal function.  Lewayne Bunting, M.D. noted that given the absence of ischemia in the LAD distribution on the Cardiolite there was no need to pursue coronary angiogram at this point. He reviewed the films but  felt that this territory was not amenable to a bypass surgery. Given these findings, he suspected that this chest pain was not ischemic in etiology but noted that the triple VP needs to be controlled.  Lewayne Bunting, M.D. also noted the elevated hemoglobin/hematocrit and suggested possibly decreasing the erythropoietin dose, as it may be contributing to the hypertension.  DISCHARGE MEDICATIONS:  1. Norvasc 5 mg q.d.  2. Lasix 40 mg q.d.  3. Epogen as directed.  4. Iron 325 mg b.i.d.  5. Baycol 0.8 mg q.d.  6. Synthroid 0.112 mg q.d.  7. Coated aspirin 325 mg q.d.  8. Hydralazine 25 mg q.d.  9. Clonidine 0.1 mg b.i.d. 10. Imdur 30 mg q.d. 11. Nitrostat as directed.  DIET:  The patient is to maintain a low-fat, cholesterol diet.  FOLLOW-UP:  She will need a follow-up BMET on Friday (June 15).  The patient is also instructed to schedule a follow-up appointment with her primary cardiologist Dr. Bing Matter in the following two weeks.  DISCHARGE DIAGNOSES:  1. Non-ischemic chest pain. Non-ischemic adenosine Cardiolite. Ejection     fraction 38%, June 10.  2. Coronary artery disease.     a. Status post non-Q myocardial infarction. Elective HSRA/stent left        anterior descending, April 2001.     b. Residual 70% circumflex.  3. Chronic renal insufficiency.  4. Hypertension.  5. Hypothyroidism.  6. Hyperlipidemia.  7. Anemia.  8.  History of bradycardia, on Lopressor. DD:  11/12/99 TD:  11/12/99 Job: [id number]

## 2010-10-19 NOTE — Cardiovascular Report (Signed)
. [facility]  Patient:    [name], [name]                         MRN: [id number] Proc. Date: 09/07/99 Adm. Date:  [id number] Attending:  Learta Codding CC:         Cardiac Catheterization Laboratory             E. Graceann Congress, M.D. [facility]                        Cardiac Catheterization  PROCEDURE:  Selective coronary angiography.  DIAGNOSIS:  Multivessel coronary artery disease.  OPERATOR:  Lewayne Bunting, M.D.  INDICATIONS:  Ms. Leanndra Pember is a 77 year old white female with a history of recent non-Q-wave myocardial infarction.  The patient also has known underlying renal dysfunction with a creatinine of 2.6.  The patient had a 2-D echocardiographic study done at [facility] which revealed moderate LV dysfunction with an ejection fraction of 35%, as well as inferior and posterior akinesis.  The patient is now referred for a diagnostic catheterization to assess her coronary anatomy.  DESCRIPTION OF PROCEDURE:  After an informed consent was obtained, the patient as brought to the cardiac catheterization laboratory.  The right groin was sterilely prepped and draped.  Lidocaine 1% was used to infiltrate the right groin, and a  6-French arterial sheath was placed using the modified Seldinger technique. The 6-French JL4 and JR4 catheters were used to engage the left and right coronary arteries.  Selective coronary angiography was performed in various projections using manual injections of contrast.  Of note was that the patient had separate  ostia for LAD and circumflex, but the circumflex artery was opacified well. Due to her underlying renal insufficiency, no selective angiography of the circumflex as performed.  No ventriculogram was performed due to her underlying renal dysfunction.  At the termination of the case, the catheter and sheaths were removed and manual pressure applied until adequate hemostasis was achieved.  The patient  tolerated the procedure well and was transferred to the floor in stable condition.  FINDINGS: SELECTIVE CORONARY ANGIOGRAPHY:  Of note was that the LAD and circumflex had separate ostia. 1. Left anterior descending coronary artery:  The left anterior descending    artery had significant calcification in its proximal and midsegment of the    vessel.  There was a focal concentric stenosis of approximately 99% in the    midvessel prior to the third diagonal.  Three diagonal vessels are arising    from the LAD, which essentially have no flow-limiting coronary artery disease. 2. Left circumflex coronary artery:  The left circumflex coronary artery is a    large vessel with two large obtuse marginal branches.  The circumflex proper    has a focal 70% stenosis in its distal segment, but this is a small caliber    vessel. 3. Right coronary artery:  The right coronary artery is a small caliber vessel hat    is diffusely diseased throughout its course.  There is a more focal    stenosis of approximately 80% in the midvessel.  CONCLUSION:  Multivessel coronary artery disease with focal stenosis of 99% in he mid-left anterior descending coronary artery.  Diffusely-diseased right coronary artery.  RECOMMENDATIONS:  The findings of the procedure were reviewed with Dr. Arturo Morton. [place].  It was felt that the LAD lesion was amenable to a rotational atherectomy,  due to the fact that the LAD vessel was significantly calcified.  The right coronary artery is a diffusely-diseased vessel and is not favorable to percutaneous coronary intervention.  Due to the fact that the patient has significant underlying renal disease, it was felt that she would benefit from a staged procedure, to be done on Monday.  The patient will be aggressively hydrated post-diagnostic catheterization.  She  will also receive another four doses of Mucomyst, starting on Sunday, to hopefully prevent contrast  nephropathy.DD:  09/07/99 TD:  09/09/99 Job: [id number]

## 2010-10-19 NOTE — Discharge Summary (Signed)
[place]. [facility]  Patient:    [name], [name]                         MRN: [id number] Adm. Date:  [id number] Disc. Date: [id number] Attending:  Bennye Alm Dictator:   Amada Jupiter, P.A. CC:         [facility]   Discharge Summary  ADMISSION DIAGNOSES: 1. Acute on chronic renal insufficiency. 2. Azotemia. 3. Hypertension. 4. Hypothyroidism. 5. Coronary artery disease.  DISCHARGE DIAGNOSES: 1. Acute on chronic renal insufficiency, stable. 2. Azotemia, stable. 3. Hypertension. 4. Hypothyroidism. 5. Coronary artery disease.  BRIEF ADMISSION HISTORY:  A 77 year old white female with chronic renal insufficiency presumed secondary to hypertension and nephrosclerosis who arrived to the office complaining of poor appetite, excessive fatigue.  She was seen in our [place] office by Dr. Lowell Guitar March 06, 2000, and felt to be slightly volume overloaded, and her Lasix was increased to 160 mg b.i.d. Creatinine at that time was 3.7 with a BUN of 71.  One week later, October 11, there was little improvement in her volume status and Zaroxolyn was added at 5 mg q.d.  On the day of admission she is returning complaining of no significant change in urine output despite high diuretics.  She feels she is making less urine and now feels excessively fatigued with poor appetite.  Her weight down only 4 pounds with increased diuretics.  She has just completed Zithromax therapy for bronchitis.  LABORATORY AND X-RAY DATA:  Review of serum creatinines reveal May 2000 creatinine 2.0, April 2001 creatinine 3.4, June 2001 creatinine 3.6, August 2001 creatinine 3.1, March 06, 2000, creatinine 3.7, October 18 creatinine 5.4, BUN 126.  Other labs on admission:  White count 10,000, hemoglobin 14.2, hematocrit of 40.0, platelets 245,000.  Sodium 127, potassium 2.6, chloride 77, CO2 28, glucose 194, BUN 156, creatinine 5.2, calcium 8.2, albumin 3.6. LFTs  within normal limits.  Phosphorus 11.8.  HOSPITAL COURSE:  The patient was admitted and placed on her usual medications except for her diuretics.  The renal ultrasound was done which was negative for hydronephrosis.  Serum protein electrophoresis showed a nonspecific pattern.  Intact parathyroid hormone was elevated at 299.  Off her diuretics the patient was making approximately 1.6 liters of urine a day.  Her BUN and creatinine remained elevated with only a slight drop in creatinine to 4.8 with BUN rising slightly to a peak of 162.  The patient, however, appeared dramatically better than her numbers implied.  She was eating well, ambulating in the halls, and on the second hospital day asking to be discharged and followed closely as an outpatient.  She did not appear to have any uremic symptoms other than possibly some decreased appetite.  She had no asterixis, she was no nauseated or vomiting.  Her excessive fatigue had improved somewhat.  All of the hemodialysis videos were shown to her along with dietary instruction.  She opted for chronic hemodialysis versus peritoneal dialysis at this time.  Clonidine was stopped, hydralazine was stopped.  Dr. Edilia Bo saw the patient and evaluated her for a left upper arm AV fistula or graft placement.  He arranged this to be done as an outpatient on October 26.  The patient was reliable enough to discharge home with close follow-up in our [place] office the following day with labs, weights, and blood pressure checks.  She was tolerating her azotemia remarkably well.  It was  felt that her worsening renal failure presumed secondary to nephrosclerosis has reached end-stage renal disease.  Arrangements will be made for outpatient dialysis the following week.  At time of discharge the patients weight was 61.5 kg. Blood pressure was well controlled at approximately 130/70.  Hemoglobin was stable at 14.2.  She was sent home and instructed to stop clonidine,  Lasix, and Zaroxolyn.  She was to resume her hydralazine at a decreased dose of 25 mg t.i.d.  She will follow up in our [place] office for blood pressure and weight checks and go to [facility] for chemistries twice a week beginning the day after discharge.  DISCHARGE MEDICATIONS: 1. Hydralazine 25 mg t.i.d. 2. Ferrous sulfate 325 mg b.i.d. 3. Isordil 20 mg q.d. 4. Aspirin 325 mg q.d. 5. Norvasc 5 mg q.d. 6. Synthroid 0.125 mg q.d. 7. Calcium carbonate 500 mg two with each meal. 8. Colace 100 mg q.d. 9. EPO 5000 units subcutaneous q.14d.  DISCHARGE FOLLOWUP:  For outpatient access placement October 26 with Dr. Edilia Bo. DD:  04/04/00 TD:  04/06/00 Job: [id number]

## 2010-10-19 NOTE — Discharge Summary (Signed)
[place]. [facility]  Patient:    [name], [name]                         MRN: [id number] Adm. Date:  [id number] Disc. Date: [id number] Attending:  Learta Codding Dictator:   Lavella Hammock, P.A. CC:         Oley Balm. Charlann Boxer, M.D.; [facility]             E. Graceann Congress, M.D. [facility]             Hayden Rasmussen, N.P.; [address]; [place], [place]. [address]                           Discharge Summary  DATE OF BIRTH:  August 30, 1933  PROCEDURES: 1. Cardiac catheterization. 2. Coronary arteriogram. 3. Ultrasound of the right groin. 4. Compression of pseudoaneurysm of the right groin.  HOSPITAL COURSE:  Mrs. [name] is a 77 year old female with a history of hypertension and chronic renal failure being seen by Dr. Charlann Boxer who was referred for further evaluation of coronary artery disease after presenting to [facility] on September 03, 1999 with prolonged left elbow, left arm, and precordial chest pain that was partly relieved by nitroglycerin.  Her enzymes were positive for MI, and she received beta-blockers, Lovenox, and IV nitroglycerin in addition to her regular medications.  She was transferred to [facility] for further evaluation.  She had a cardiac catheterization on September 07, 1999, which showed an LAD that was calcified in the proximal and mid section with a mid 99% stenosis.  The circumflex had a 70% stenosis in the circumflex distally.  The RCA was diffusely diseased with a focal stenosis of approximately 80%.  She had an EF of 35-40% with inferior and posterior akinesis by echo, and a left ventriculogram was not done.  It was felt that she needed rotational atherectomy of her LAD, and this was scheduled for Monday.  A renal consult was called to assist in managing her renal function. Mucomyst was added to her medication regimen to assist with preventing further elevation of her BUN and creatinine, and she was followed by renal on a daily basis.   As far as her renal function went, she did well, and she had a creatinine of 2.6 at discharge.  She remained stable and had HSRA and stent to her LAD on Monday, which reduced the stenosis from 90% to 0% with ______ flow.  She tolerated the procedure well, and the sheath was removed without difficulty.  She had some problems with hypertension.  The systolic blood pressure was in the 190s, and her medications were adjusted to treat this. She began complaining of pain in her right groin, and a hematoma was noted to be in that area.  There was a bruit present, and so peripheral vascular ultrasound was done, which showed a superficial A-V fistula and a pseudoaneurysm in the right groin.  Compression was performed for 40-minutes, and the A-V fistula, as well as the pseudoaneurysm, resolved.  She required significant pain medication during this procedure, and her heart rate was consistently in the 40s.  Because of this, she was held overnight.  The next day, a recheck on her aneurysm showed that it was resolved, and she was feeling much better and ambulating without difficulty.  Her hemoglobin and hematocrit were low but stable.  She had been on  Lopressor, but with her heart rate still being in the 40s, this was discontinued.  She had a cholesterol panel done which showed an LDL of 156, and because of this, she was continued on the Zocor 20 mg, which had been added to her medication regimen.  She was considered stable for discharge on September 13, 1999.  CONDITION ON DISCHARGE:  Improved.  CONSULTATIONS:  Dr. Caryn Section with [facility].  DISCHARGE DIAGNOSES:  1. Acute myocardial infarction, status post high-speed rotational atherectomy     and stent to the left anterior descending.  2. Residual coronary artery disease with a 70% circumflex and a diffusely     diseased right coronary artery with an 80% stenosis.  3. Left ventricular dysfunction with an ejection fraction of 25-30% by  echo     this admission.  4. Chronic renal insufficiency with a creatinine of 2.6 at discharge.  5. Anemia.  6. Bradycardia.  7. Hypertension.  8. Hyperlipidemia.  9. Right groin pseudoaneurysm and A-V fistula, resolved after compression. 10. History of tobacco abuse. 11. Hypothyroidism on Synthroid. 12. Status post hysterectomy and hemorrhoidectomy.  DISCHARGE ACTIVITIES:  Her activity level is to include no driving for two days and no strenuous activity.  DISCHARGE DIET:  She is to stick to a low-fat diet.  DISCHARGE INSTRUCTIONS:  She is to call the office for bleeding, swelling, or drainage at the catheterization site.  SPECIAL INSTRUCTIONS:  She is to not take her Lasix for now.  FOLLOWUP:  She is to follow up with Dr. Charlann Boxer on Oct 05, 1999 at 11 a.m.  She is to follow up at [facility] on September 14, 1999 at the [facility]. She is to follow up with Dr. ______ at [facility] and call for an appointment, and with Hayden Rasmussen, N.P. and to call for appointment as well.  DISCHARGE MEDICATIONS: 1. Epogen 10,000 units subcutaneously every week. 2. Iron 325 mg b.i.d. between meals. 3. Procardia XL 30 mg q.d. 4. Baycol 0.4 mg q.d. 5. Plavix 75 mg q.d. 6. Synthroid 125 mcg 1/2 tablet daily. 7. Coated aspirin 325 mg q.d. 8. Imdur 30 mg q.d. 9. Nitroglycerin 0.4 mg sublingual p.r.n. DD:  09/14/99 TD:  09/14/99 Job: [id number]

## 2010-10-23 ENCOUNTER — Ambulatory Visit (HOSPITAL_COMMUNITY)
Admission: RE | Admit: 2010-10-23 | Discharge: 2010-10-23 | Disposition: A | Discharge status: Home / Self Care | Payer: Medicare Other | Source: Ambulatory Visit | Attending: Surgery | Admitting: Surgery

## 2010-10-23 ENCOUNTER — Encounter (HOSPITAL_COMMUNITY)
Admission: RE | Admit: 2010-10-23 | Discharge: 2010-10-23 | Disposition: A | Discharge status: Home / Self Care | Payer: Medicare Other | Source: Ambulatory Visit | Attending: Surgery | Admitting: Surgery

## 2010-10-23 ENCOUNTER — Other Ambulatory Visit (INDEPENDENT_AMBULATORY_CARE_PROVIDER_SITE_OTHER): Payer: Self-pay | Admitting: Surgery

## 2010-10-23 DIAGNOSIS — R05 Cough: Secondary | ICD-10-CM | POA: Insufficient documentation

## 2010-10-23 DIAGNOSIS — Z01818 Encounter for other preprocedural examination: Secondary | ICD-10-CM | POA: Insufficient documentation

## 2010-10-23 DIAGNOSIS — R1900 Intra-abdominal and pelvic swelling, mass and lump, unspecified site: Secondary | ICD-10-CM

## 2010-10-23 DIAGNOSIS — I517 Cardiomegaly: Secondary | ICD-10-CM | POA: Insufficient documentation

## 2010-10-23 DIAGNOSIS — R059 Cough, unspecified: Secondary | ICD-10-CM | POA: Insufficient documentation

## 2010-10-23 DIAGNOSIS — Z01812 Encounter for preprocedural laboratory examination: Secondary | ICD-10-CM | POA: Insufficient documentation

## 2010-10-23 DIAGNOSIS — J449 Chronic obstructive pulmonary disease, unspecified: Secondary | ICD-10-CM | POA: Insufficient documentation

## 2010-10-23 DIAGNOSIS — J4489 Other specified chronic obstructive pulmonary disease: Secondary | ICD-10-CM | POA: Insufficient documentation

## 2010-10-23 DIAGNOSIS — I2510 Atherosclerotic heart disease of native coronary artery without angina pectoris: Secondary | ICD-10-CM | POA: Insufficient documentation

## 2010-10-23 DIAGNOSIS — R1909 Other intra-abdominal and pelvic swelling, mass and lump: Secondary | ICD-10-CM | POA: Insufficient documentation

## 2010-10-23 LAB — CBC
Hemoglobin: 11.3 g/dL — ABNORMAL LOW (ref 12.0–15.0)
MCH: 35.4 pg — ABNORMAL HIGH (ref 26.0–34.0)
MCV: 108.2 fL — ABNORMAL HIGH (ref 78.0–100.0)
RBC: 3.19 MIL/uL — ABNORMAL LOW (ref 3.87–5.11)

## 2010-10-23 LAB — BASIC METABOLIC PANEL
BUN: 39 mg/dL — ABNORMAL HIGH (ref 6–23)
CO2: 30 mEq/L (ref 19–32)
Chloride: 96 mEq/L (ref 96–112)
Creatinine, Ser: 5.98 mg/dL — ABNORMAL HIGH (ref 0.4–1.2)
GFR calc Af Amer: 8 mL/min — ABNORMAL LOW (ref >60)
Potassium: 5.4 mEq/L — ABNORMAL HIGH (ref 3.5–5.1)

## 2010-10-23 LAB — SURGICAL PCR SCREEN: MRSA, PCR: NEGATIVE

## 2010-10-26 ENCOUNTER — Ambulatory Visit (HOSPITAL_COMMUNITY)
Admission: RE | Admit: 2010-10-26 | Discharge: 2010-10-26 | Disposition: A | Discharge status: Home / Self Care | Payer: Medicare Other | Source: Ambulatory Visit | Attending: Surgery | Admitting: Surgery

## 2010-10-26 DIAGNOSIS — I2510 Atherosclerotic heart disease of native coronary artery without angina pectoris: Secondary | ICD-10-CM | POA: Insufficient documentation

## 2010-10-26 DIAGNOSIS — Z950 Presence of cardiac pacemaker: Secondary | ICD-10-CM | POA: Insufficient documentation

## 2010-10-26 DIAGNOSIS — N186 End stage renal disease: Secondary | ICD-10-CM | POA: Insufficient documentation

## 2010-10-26 DIAGNOSIS — J4489 Other specified chronic obstructive pulmonary disease: Secondary | ICD-10-CM | POA: Insufficient documentation

## 2010-10-26 DIAGNOSIS — Z01810 Encounter for preprocedural cardiovascular examination: Secondary | ICD-10-CM | POA: Insufficient documentation

## 2010-10-26 DIAGNOSIS — Z9981 Dependence on supplemental oxygen: Secondary | ICD-10-CM | POA: Insufficient documentation

## 2010-10-26 DIAGNOSIS — Z992 Dependence on renal dialysis: Secondary | ICD-10-CM | POA: Insufficient documentation

## 2010-10-26 DIAGNOSIS — Z8673 Personal history of transient ischemic attack (TIA), and cerebral infarction without residual deficits: Secondary | ICD-10-CM | POA: Insufficient documentation

## 2010-10-26 DIAGNOSIS — Z01812 Encounter for preprocedural laboratory examination: Secondary | ICD-10-CM | POA: Insufficient documentation

## 2010-10-26 DIAGNOSIS — Z01818 Encounter for other preprocedural examination: Secondary | ICD-10-CM | POA: Insufficient documentation

## 2010-10-26 DIAGNOSIS — J449 Chronic obstructive pulmonary disease, unspecified: Secondary | ICD-10-CM | POA: Insufficient documentation

## 2010-10-26 DIAGNOSIS — I120 Hypertensive chronic kidney disease with stage 5 chronic kidney disease or end stage renal disease: Secondary | ICD-10-CM | POA: Insufficient documentation

## 2010-10-26 DIAGNOSIS — K430 Incisional hernia with obstruction, without gangrene: Secondary | ICD-10-CM | POA: Insufficient documentation

## 2010-10-30 LAB — POCT I-STAT 4, (NA,K, GLUC, HGB,HCT)
Glucose, Bld: 93 mg/dL (ref 70–99)
HCT: 34 % — ABNORMAL LOW (ref 36.0–46.0)
Potassium: 5.3 mEq/L — ABNORMAL HIGH (ref 3.5–5.1)

## 2010-11-15 NOTE — Op Note (Signed)
  [name], [name]                  ACCOUNT NO.:  [id number]  MEDICAL RECORD NO.:  [id number]           PATIENT TYPE:  O  LOCATION:  [facility]SC                         FACILITY:  [facility]  PHYSICIAN:  Abigail Miyamoto, M.D. DATE OF BIRTH:  Mar 20, 1933  DATE OF PROCEDURE:  10/26/2010 DATE OF DISCHARGE:  10/26/2010                              OPERATIVE REPORT   PREOPERATIVE DIAGNOSIS:  Abdominal wall mass.  POSTOPERATIVE DIAGNOSIS:  Incarcerated incisional hernia.  PROCEDURE:  Repair of incarcerated incisional hernia.  SURGEON:  Abigail Miyamoto, MD  ANESTHESIA:  1% lidocaine, 0.5% Marcaine, and monitored anesthesia care.  ESTIMATED BLOOD LOSS:  Minimal.  INDICATIONS:  This is a 77 year old female with multiple comorbidities, on hemodialysis.  She has developed a tender area in her upper abdomen between the xiphoid and the umbilicus.  On physical examination, she has a 2.5-cm soft nodule which is tender in the area of an old scar from the cholecystectomy.  Decision was made to explore this area.  FINDINGS:  The patient was found to have an incarcerated incisional hernia containing omentum.  PROCEDURE IN DETAIL:  The patient was brought to the operating room and identified as [name].  She was placed supine on the operating table and anesthesia was induced.  Her abdomen was prepped and draped in the usual sterile fashion.  The skin overlying the palpable abdominal wall was anesthetized with 1% lidocaine.  I made a small incision with a #15 blade and took this down to the small mass.  The mass was consistent with incarcerated omentum.  No sac developed around the omentum.  I was able to easily reduce it back into the abdominal cavity.  The actual fascial defect itself was about 5 mm in size.  I closed the fascial defect with a figure-of-eight 0 Prolene suture.  I then anesthetized the fascia and skin with Marcaine.  I closed the subcutaneous tissue with interrupted 3-0 Vicryl  sutures and closed the skin with running 4-0 Monocryl.  Steri-Strips, gauze, and tape were then applied.  The patient tolerated the procedure well.  All counts were correct at the end of the procedure.  The patient was then taken in stable condition from the operating room to the recovery room.    Abigail Miyamoto, M.D.    DB/MEDQ  D:  10/26/2010  T:  10/26/2010  Job:  [id number]  Electronically Signed by Abigail Miyamoto M.D. on 11/15/2010 07:05:06 AM

## 2010-11-29 ENCOUNTER — Ambulatory Visit: Payer: Medicare Other | Admitting: Cardiovascular Disease

## 2011-03-11 LAB — CBC
HCT: 39.6
Hemoglobin: 12.7
MCHC: 32.2
RBC: 4
RDW: 25.1 — ABNORMAL HIGH

## 2011-03-11 LAB — I-STAT 8, (EC8 V) (CONVERTED LAB)
BUN: 41 — ABNORMAL HIGH
BUN: 43 — ABNORMAL HIGH
Bicarbonate: 33.2 — ABNORMAL HIGH
Glucose, Bld: 101 — ABNORMAL HIGH
Glucose, Bld: 95
Hemoglobin: 15
Potassium: 7.1
Sodium: 132 — ABNORMAL LOW
TCO2: 35
TCO2: 38
pCO2, Ven: 52.4 — ABNORMAL HIGH
pH, Ven: 7.371 — ABNORMAL HIGH
pH, Ven: 7.409 — ABNORMAL HIGH

## 2011-03-11 LAB — POCT CARDIAC MARKERS
CKMB, poc: 1.4
Myoglobin, poc: 140
Operator id: 294501

## 2011-03-11 LAB — RENAL FUNCTION PANEL
BUN: 32 — ABNORMAL HIGH
CO2: 28
Calcium: 8.9
Glucose, Bld: 120 — ABNORMAL HIGH
Phosphorus: 8.5 — ABNORMAL HIGH
Sodium: 141

## 2011-03-11 LAB — POCT I-STAT CREATININE: Operator id: 294501

## 2011-03-12 LAB — BASIC METABOLIC PANEL
BUN: 22
BUN: 24 — ABNORMAL HIGH
BUN: 27 — ABNORMAL HIGH
CO2: 29
CO2: 31
Calcium: 8.7
Calcium: 9
Calcium: 9.1
Creatinine, Ser: 4.97 — ABNORMAL HIGH
Creatinine, Ser: 5.22 — ABNORMAL HIGH
Creatinine, Ser: 5.53 — ABNORMAL HIGH
Creatinine, Ser: 6.44 — ABNORMAL HIGH
GFR calc Af Amer: 10 — ABNORMAL LOW
GFR calc Af Amer: 8 — ABNORMAL LOW
GFR calc Af Amer: 9 — ABNORMAL LOW
GFR calc non Af Amer: 8 — ABNORMAL LOW
GFR calc non Af Amer: 9 — ABNORMAL LOW
Glucose, Bld: 77
Potassium: 4.6

## 2011-03-12 LAB — CARDIAC PANEL(CRET KIN+CKTOT+MB+TROPI)
CK, MB: 2.7
Relative Index: INVALID
Relative Index: INVALID
Total CK: 36
Troponin I: 0.09 — ABNORMAL HIGH

## 2011-03-12 LAB — CBC
HCT: 34.4 — ABNORMAL LOW
HCT: 36.7
Hemoglobin: 11.1 — ABNORMAL LOW
MCHC: 32.1
MCHC: 32.3
MCHC: 32.3
MCV: 98.4
MCV: 99.4
Platelets: 224
Platelets: 252
Platelets: 254
RBC: 3.67 — ABNORMAL LOW
RDW: 23.5 — ABNORMAL HIGH
RDW: 23.9 — ABNORMAL HIGH
RDW: 24.2 — ABNORMAL HIGH
RDW: 24.3 — ABNORMAL HIGH
RDW: 24.5 — ABNORMAL HIGH

## 2011-03-12 LAB — PROTIME-INR
INR: 1.2
INR: 1.6 — ABNORMAL HIGH
INR: 1.7 — ABNORMAL HIGH
Prothrombin Time: 19.1 — ABNORMAL HIGH
Prothrombin Time: 20 — ABNORMAL HIGH

## 2011-03-12 LAB — I-STAT 8, (EC8 V) (CONVERTED LAB)
BUN: 17
Bicarbonate: 34.2 — ABNORMAL HIGH
Glucose, Bld: 108 — ABNORMAL HIGH
HCT: 41
Hemoglobin: 13.9
Operator id: 270651
Potassium: 3.4 — ABNORMAL LOW
Sodium: 138
TCO2: 36

## 2011-03-12 LAB — RENAL FUNCTION PANEL
Albumin: 2.5 — ABNORMAL LOW
Calcium: 8.3 — ABNORMAL LOW
Creatinine, Ser: 7.13 — ABNORMAL HIGH
GFR calc Af Amer: 7 — ABNORMAL LOW
GFR calc non Af Amer: 6 — ABNORMAL LOW

## 2011-03-12 LAB — POCT I-STAT CREATININE
Creatinine, Ser: 4 — ABNORMAL HIGH
Operator id: 270651

## 2011-03-12 LAB — CK TOTAL AND CKMB (NOT AT ARMC)
Relative Index: INVALID
Total CK: 38

## 2011-03-13 LAB — CBC
HCT: 25.3 — ABNORMAL LOW
HCT: 25.5 — ABNORMAL LOW
Hemoglobin: 8.6 — ABNORMAL LOW
Hemoglobin: 9.7 — ABNORMAL LOW
MCHC: 33.8
MCHC: 34.1
MCV: 97.5
MCV: 98
Platelets: 179
Platelets: 209
RBC: 2.6 — ABNORMAL LOW
RDW: 18.1 — ABNORMAL HIGH
RDW: 18.6 — ABNORMAL HIGH

## 2011-03-13 LAB — HEMOGLOBIN AND HEMATOCRIT, BLOOD
HCT: 28.8 — ABNORMAL LOW
HCT: 30 — ABNORMAL LOW
HCT: 30.9 — ABNORMAL LOW
Hemoglobin: 10 — ABNORMAL LOW
Hemoglobin: 10.4 — ABNORMAL LOW
Hemoglobin: 9.9 — ABNORMAL LOW

## 2011-03-13 LAB — CARDIAC PANEL(CRET KIN+CKTOT+MB+TROPI)
Relative Index: INVALID
Relative Index: INVALID
Troponin I: 0.65

## 2011-03-13 LAB — RENAL FUNCTION PANEL
Albumin: 2.2 — ABNORMAL LOW
BUN: 52 — ABNORMAL HIGH
CO2: 27
Calcium: 7.7 — ABNORMAL LOW
Calcium: 7.9 — ABNORMAL LOW
Creatinine, Ser: 3.3 — ABNORMAL HIGH
GFR calc Af Amer: 11 — ABNORMAL LOW
GFR calc Af Amer: 17 — ABNORMAL LOW
GFR calc non Af Amer: 14 — ABNORMAL LOW
GFR calc non Af Amer: 9 — ABNORMAL LOW
Phosphorus: 4.7 — ABNORMAL HIGH
Potassium: 3.6
Sodium: 133 — ABNORMAL LOW

## 2011-03-13 LAB — BASIC METABOLIC PANEL
BUN: 59 — ABNORMAL HIGH
Chloride: 97
Creatinine, Ser: 4.65 — ABNORMAL HIGH
Glucose, Bld: 90

## 2011-03-13 LAB — PHOSPHORUS: Phosphorus: 5.9 — ABNORMAL HIGH

## 2011-03-13 LAB — PREPARE RBC (CROSSMATCH)

## 2011-03-13 LAB — PREPARE FRESH FROZEN PLASMA

## 2011-03-13 LAB — PROTIME-INR: INR: 1.8 — ABNORMAL HIGH

## 2011-03-14 LAB — TYPE AND SCREEN

## 2011-03-14 LAB — CBC
HCT: 18.5 — ABNORMAL LOW
Hemoglobin: 6.2 — CL
MCV: 101.2 — ABNORMAL HIGH
Platelets: 215
RDW: 21.7 — ABNORMAL HIGH

## 2011-03-14 LAB — PREPARE FRESH FROZEN PLASMA

## 2011-03-14 LAB — COMPREHENSIVE METABOLIC PANEL
ALT: 20
AST: 14
Calcium: 7.7 — ABNORMAL LOW
Creatinine, Ser: 6.23 — ABNORMAL HIGH
GFR calc Af Amer: 8 — ABNORMAL LOW
Sodium: 141
Total Protein: 4 — ABNORMAL LOW

## 2011-03-14 LAB — PROTIME-INR
INR: 2.1 — ABNORMAL HIGH
Prothrombin Time: 24.4 — ABNORMAL HIGH

## 2011-03-14 LAB — POCT I-STAT 3, ART BLOOD GAS (G3+)
Acid-Base Excess: 5 — ABNORMAL HIGH
Bicarbonate: 29.3 — ABNORMAL HIGH
O2 Saturation: 99
Patient temperature: 97.3
TCO2: 30
pO2, Arterial: 123 — ABNORMAL HIGH

## 2011-03-14 LAB — CARDIAC PANEL(CRET KIN+CKTOT+MB+TROPI)
CK, MB: 3.1
Relative Index: INVALID
Total CK: 64
Troponin I: 0.08 — ABNORMAL HIGH

## 2011-05-04 HISTORY — PX: INSERT / REPLACE / REMOVE PACEMAKER: SUR710

## 2011-06-20 ENCOUNTER — Inpatient Hospital Stay (HOSPITAL_COMMUNITY)
Admission: AD | Admit: 2011-06-20 | Discharge: 2011-06-26 | DRG: 190 | Payer: Medicare Other | Source: Other Acute Inpatient Hospital | Attending: Internal Medicine | Admitting: Internal Medicine

## 2011-06-20 ENCOUNTER — Inpatient Hospital Stay (HOSPITAL_COMMUNITY): Payer: Medicare Other

## 2011-06-20 ENCOUNTER — Encounter (HOSPITAL_COMMUNITY): Payer: Self-pay | Admitting: General Practice

## 2011-06-20 ENCOUNTER — Other Ambulatory Visit: Payer: Self-pay

## 2011-06-20 DIAGNOSIS — I5022 Chronic systolic (congestive) heart failure: Secondary | ICD-10-CM | POA: Diagnosis present

## 2011-06-20 DIAGNOSIS — I509 Heart failure, unspecified: Secondary | ICD-10-CM | POA: Diagnosis present

## 2011-06-20 DIAGNOSIS — I2510 Atherosclerotic heart disease of native coronary artery without angina pectoris: Secondary | ICD-10-CM | POA: Diagnosis present

## 2011-06-20 DIAGNOSIS — E785 Hyperlipidemia, unspecified: Secondary | ICD-10-CM | POA: Diagnosis present

## 2011-06-20 DIAGNOSIS — Z950 Presence of cardiac pacemaker: Secondary | ICD-10-CM

## 2011-06-20 DIAGNOSIS — J441 Chronic obstructive pulmonary disease with (acute) exacerbation: Principal | ICD-10-CM | POA: Diagnosis present

## 2011-06-20 DIAGNOSIS — N2581 Secondary hyperparathyroidism of renal origin: Secondary | ICD-10-CM | POA: Diagnosis present

## 2011-06-20 DIAGNOSIS — F341 Dysthymic disorder: Secondary | ICD-10-CM | POA: Diagnosis present

## 2011-06-20 DIAGNOSIS — E875 Hyperkalemia: Secondary | ICD-10-CM | POA: Diagnosis present

## 2011-06-20 DIAGNOSIS — N186 End stage renal disease: Secondary | ICD-10-CM

## 2011-06-20 DIAGNOSIS — F419 Anxiety disorder, unspecified: Secondary | ICD-10-CM | POA: Diagnosis present

## 2011-06-20 DIAGNOSIS — I120 Hypertensive chronic kidney disease with stage 5 chronic kidney disease or end stage renal disease: Secondary | ICD-10-CM | POA: Diagnosis present

## 2011-06-20 DIAGNOSIS — D696 Thrombocytopenia, unspecified: Secondary | ICD-10-CM | POA: Diagnosis present

## 2011-06-20 DIAGNOSIS — Z992 Dependence on renal dialysis: Secondary | ICD-10-CM

## 2011-06-20 DIAGNOSIS — I428 Other cardiomyopathies: Secondary | ICD-10-CM | POA: Diagnosis present

## 2011-06-20 DIAGNOSIS — I10 Essential (primary) hypertension: Secondary | ICD-10-CM | POA: Diagnosis present

## 2011-06-20 DIAGNOSIS — I4891 Unspecified atrial fibrillation: Secondary | ICD-10-CM | POA: Diagnosis present

## 2011-06-20 DIAGNOSIS — J449 Chronic obstructive pulmonary disease, unspecified: Secondary | ICD-10-CM | POA: Diagnosis present

## 2011-06-20 HISTORY — DX: Major depressive disorder, single episode, unspecified: F32.9

## 2011-06-20 HISTORY — DX: Hypothyroidism, unspecified: E03.9

## 2011-06-20 HISTORY — DX: Dependence on renal dialysis: N18.6

## 2011-06-20 HISTORY — DX: Depression, unspecified: F32.A

## 2011-06-20 HISTORY — DX: Cerebral infarction, unspecified: I63.9

## 2011-06-20 HISTORY — DX: Abdominal aortic aneurysm, without rupture, unspecified: I71.40

## 2011-06-20 HISTORY — DX: Abdominal aortic aneurysm, without rupture: I71.4

## 2011-06-20 HISTORY — DX: Anemia, unspecified: D64.9

## 2011-06-20 HISTORY — DX: Pneumonia, unspecified organism: J18.9

## 2011-06-20 LAB — COMPREHENSIVE METABOLIC PANEL
ALT: 37 U/L — ABNORMAL HIGH (ref 0–35)
Alkaline Phosphatase: 51 U/L (ref 39–117)
BUN: 51 mg/dL — ABNORMAL HIGH (ref 6–23)
Chloride: 93 mEq/L — ABNORMAL LOW (ref 96–112)
GFR calc Af Amer: 8 mL/min — ABNORMAL LOW (ref >90)
Glucose, Bld: 170 mg/dL — ABNORMAL HIGH (ref 70–99)
Potassium: 4.7 mEq/L (ref 3.5–5.1)
Sodium: 135 mEq/L (ref 135–145)
Total Bilirubin: 0.3 mg/dL (ref 0.3–1.2)

## 2011-06-20 LAB — CBC
HCT: 33.6 % — ABNORMAL LOW (ref 36.0–46.0)
MCH: 34.4 pg — ABNORMAL HIGH (ref 26.0–34.0)
MCV: 107 fL — ABNORMAL HIGH (ref 78.0–100.0)
Platelets: 137 10*3/uL — ABNORMAL LOW (ref 150–400)
RBC: 3.14 MIL/uL — ABNORMAL LOW (ref 3.87–5.11)

## 2011-06-20 LAB — PRO B NATRIURETIC PEPTIDE: Pro B Natriuretic peptide (BNP): 22212 pg/mL — ABNORMAL HIGH (ref 0–450)

## 2011-06-20 LAB — CARDIAC PANEL(CRET KIN+CKTOT+MB+TROPI): Troponin I: <0.3 ng/mL (ref <0.30)

## 2011-06-20 MED ORDER — GUAIFENESIN-DM 100-10 MG/5ML PO SYRP
5.0000 mL | ORAL_SOLUTION | ORAL | Status: DC | PRN
  Administered 2011-06-22 – 2011-06-26 (×3): 5 mL via ORAL
  Filled 2011-06-20 (×3): qty 5

## 2011-06-20 MED ORDER — ONDANSETRON HCL 4 MG PO TABS: 4.0000 mg | ORAL_TABLET | Freq: Four times a day (QID) | ORAL | Status: DC | PRN

## 2011-06-20 MED ORDER — LEVOTHYROXINE SODIUM 150 MCG PO CAPS: 1.0000 | ORAL_CAPSULE | Freq: Every day | ORAL | Status: DC

## 2011-06-20 MED ORDER — ISOSORBIDE DINITRATE 20 MG PO TABS
20.0000 mg | ORAL_TABLET | Freq: Three times a day (TID) | ORAL | Status: DC
  Administered 2011-06-20 – 2011-06-26 (×16): 20 mg via ORAL
  Filled 2011-06-20 (×20): qty 1

## 2011-06-20 MED ORDER — ASPIRIN EC 81 MG PO TBEC
81.0000 mg | DELAYED_RELEASE_TABLET | Freq: Every day | ORAL | Status: DC
  Administered 2011-06-20 – 2011-06-26 (×7): 81 mg via ORAL
  Filled 2011-06-20 (×7): qty 1

## 2011-06-20 MED ORDER — NON FORMULARY: 3.0000 mg | Freq: Every evening | Status: DC | PRN

## 2011-06-20 MED ORDER — NITROGLYCERIN 0.4 MG SL SUBL: 0.4000 mg | SUBLINGUAL_TABLET | SUBLINGUAL | Status: DC | PRN

## 2011-06-20 MED ORDER — CARVEDILOL 6.25 MG PO TABS
6.2500 mg | ORAL_TABLET | Freq: Two times a day (BID) | ORAL | Status: DC
  Administered 2011-06-20 – 2011-06-26 (×12): 6.25 mg via ORAL
  Filled 2011-06-20 (×14): qty 1

## 2011-06-20 MED ORDER — BISACODYL 5 MG PO TBEC
5.0000 mg | DELAYED_RELEASE_TABLET | Freq: Every day | ORAL | Status: DC | PRN
  Administered 2011-06-23: 5 mg via ORAL
  Filled 2011-06-20: qty 1

## 2011-06-20 MED ORDER — ROSUVASTATIN CALCIUM 40 MG PO TABS
40.0000 mg | ORAL_TABLET | Freq: Every day | ORAL | Status: DC
  Administered 2011-06-20 – 2011-06-26 (×7): 40 mg via ORAL
  Filled 2011-06-20 (×7): qty 1

## 2011-06-20 MED ORDER — MOXIFLOXACIN HCL IN NACL 400 MG/250ML IV SOLN
400.0000 mg | INTRAVENOUS | Status: DC
  Administered 2011-06-20 – 2011-06-23 (×4): 400 mg via INTRAVENOUS
  Filled 2011-06-20 (×6): qty 250

## 2011-06-20 MED ORDER — METHYLPREDNISOLONE SODIUM SUCC 40 MG IJ SOLR
40.0000 mg | Freq: Three times a day (TID) | INTRAMUSCULAR | Status: DC
  Administered 2011-06-20 – 2011-06-22 (×5): 40 mg via INTRAVENOUS
  Filled 2011-06-20 (×8): qty 1

## 2011-06-20 MED ORDER — LORATADINE 10 MG PO TABS
10.0000 mg | ORAL_TABLET | Freq: Every day | ORAL | Status: DC
  Administered 2011-06-20 – 2011-06-26 (×7): 10 mg via ORAL
  Filled 2011-06-20 (×7): qty 1

## 2011-06-20 MED ORDER — LEVALBUTEROL HCL 0.63 MG/3ML IN NEBU
0.6300 mg | INHALATION_SOLUTION | Freq: Four times a day (QID) | RESPIRATORY_TRACT | Status: DC
  Administered 2011-06-20 – 2011-06-23 (×12): 0.63 mg via RESPIRATORY_TRACT
  Filled 2011-06-20 (×16): qty 3

## 2011-06-20 MED ORDER — RENA-VITE PO TABS
1.0000 | ORAL_TABLET | Freq: Every day | ORAL | Status: DC
  Administered 2011-06-20 – 2011-06-21 (×2): 1 via ORAL
  Filled 2011-06-20 (×2): qty 1

## 2011-06-20 MED ORDER — FUROSEMIDE 80 MG PO TABS
80.0000 mg | ORAL_TABLET | Freq: Two times a day (BID) | ORAL | Status: DC
  Administered 2011-06-20 – 2011-06-26 (×11): 80 mg via ORAL
  Filled 2011-06-20 (×13): qty 1

## 2011-06-20 MED ORDER — DOCUSATE SODIUM 100 MG PO CAPS
100.0000 mg | ORAL_CAPSULE | Freq: Two times a day (BID) | ORAL | Status: DC
  Administered 2011-06-20 – 2011-06-26 (×11): 100 mg via ORAL
  Filled 2011-06-20 (×13): qty 1

## 2011-06-20 MED ORDER — CLOPIDOGREL BISULFATE 75 MG PO TABS
75.0000 mg | ORAL_TABLET | Freq: Every day | ORAL | Status: DC
  Administered 2011-06-20 – 2011-06-26 (×7): 75 mg via ORAL
  Filled 2011-06-20 (×7): qty 1

## 2011-06-20 MED ORDER — CINACALCET HCL 30 MG PO TABS
60.0000 mg | ORAL_TABLET | Freq: Every day | ORAL | Status: DC
  Administered 2011-06-20 – 2011-06-26 (×7): 60 mg via ORAL
  Filled 2011-06-20 (×7): qty 2

## 2011-06-20 MED ORDER — FOLIC ACID 1 MG PO TABS
1.0000 mg | ORAL_TABLET | Freq: Every day | ORAL | Status: DC
  Administered 2011-06-20 – 2011-06-26 (×7): 1 mg via ORAL
  Filled 2011-06-20 (×7): qty 1

## 2011-06-20 MED ORDER — ONDANSETRON HCL 4 MG/2ML IJ SOLN
4.0000 mg | Freq: Four times a day (QID) | INTRAMUSCULAR | Status: DC | PRN
  Administered 2011-06-21: 4 mg via INTRAVENOUS
  Filled 2011-06-20: qty 2

## 2011-06-20 MED ORDER — GUAIFENESIN ER 600 MG PO TB12
600.0000 mg | ORAL_TABLET | Freq: Two times a day (BID) | ORAL | Status: DC
  Administered 2011-06-20 – 2011-06-22 (×4): 600 mg via ORAL
  Filled 2011-06-20 (×5): qty 1

## 2011-06-20 MED ORDER — CALCIUM ACETATE 667 MG PO CAPS
667.0000 mg | ORAL_CAPSULE | Freq: Three times a day (TID) | ORAL | Status: DC
  Filled 2011-06-20 (×2): qty 1

## 2011-06-20 MED ORDER — LEVOTHYROXINE SODIUM 150 MCG PO TABS
150.0000 ug | ORAL_TABLET | Freq: Every day | ORAL | Status: DC
  Administered 2011-06-21 – 2011-06-26 (×5): 150 ug via ORAL
  Filled 2011-06-20 (×6): qty 1

## 2011-06-20 MED ORDER — ENOXAPARIN SODIUM 30 MG/0.3ML ~~LOC~~ SOLN
30.0000 mg | SUBCUTANEOUS | Status: DC
  Administered 2011-06-20 – 2011-06-25 (×6): 30 mg via SUBCUTANEOUS
  Filled 2011-06-20 (×7): qty 0.3

## 2011-06-20 MED ORDER — LEVALBUTEROL HCL 0.63 MG/3ML IN NEBU
0.6300 mg | INHALATION_SOLUTION | RESPIRATORY_TRACT | Status: DC | PRN
  Filled 2011-06-20: qty 3

## 2011-06-20 MED ORDER — ESZOPICLONE 1 MG PO TABS: 3.0000 mg | ORAL_TABLET | Freq: Every evening | ORAL | Status: DC | PRN

## 2011-06-20 NOTE — H&P (Signed)
PCP:   Galvin Proffer, MD, MD   Chief Complaint:  Worsening SOB  HPI: Pt is a pleasant 78yo with past medical history significant for COPD, ESRD (MWF dialysis) , CHF who presents with the above complaints. She states that for the past few weeks she has had a woreningcough- mostly nonproductive and  nasal congestion with a postnasal drip. She also developed SOB that has worsened - on Monday(5/14) she saw MD and was started on an antibiotic and steroid, but her SOB continue to worsen and so she came to [facility] ED today. She was noted to have bilateral wheezing on her exam, and was treated for COPD exacerbatiion  with nebs, solumedrol. with some improvement but continued to be symptomatic and so rquired admission for further eval and management.She also received IV lasix at [facility]. Because she is dialysis patient she was transferred to [facility] as they do not have dialysis services. CXR done at [facility] showed stable vascular congestion and mild perihilar increased interstitial markings suspicious for early edema, ABG showed pH 7.41, PCO2 40, PO2 67. She IS ON HOME O2 but states that she only uses it PRN.  She denies leg swelling, PND, hemoptysis, fevers, chest pain, arthralgias, myalgias. She admits to constipation.  Review of Systems:  The patient denies anorexia, fever, weight loss,, vision loss, decreased hearing, hoarseness, chest pain, syncope,  peripheral edema, balance deficits, hemoptysis, abdominal pain, melena, hematochezia, severe indigestion/heartburn, hematuria,   muscle weakness, transient blindness, difficulty walking, depression, unusual weight change, abnormal bleeding.  Past Medical History: Past Medical History  Diagnosis Date  . History of stroke   . Carotid artery occlusion     Bilateral carotid artery disease  . COPD (chronic obstructive pulmonary disease)   . Peripheral artery disease   . Arrhythmia     ? hx of atrial fibrillation  . Anxiety and depression   .  Lightheadedness   . SOB (shortness of breath)   . CHF (congestive heart failure)     EF 25-30%.   . Chronic kidney disease     End-stage on hemodialysis  . Coronary artery disease 2008    post MI  . Hypertension   . Hyperlipidemia    Past Surgical History  Procedure Date  . Coronary artery bypass graft 2008    three-vessel  . Insert / replace / remove pacemaker   . Thyroid surgery 2004  . Abdominal hysterectomy 1976  . Hemorroid surgery     at age 89  . Peripheral angioplasty   . Dg av dialysis  shunt access exist*l* or     left arm  . Cardiac catheterization 02/10, 03/12    Most recent showed 3 vessel CAD with patent grafts: SVG to LAD, SVG to PDA and SVG to OM    Medications: Prior to Admission medications   Medication Sig Start Date End Date Taking? Authorizing Provider  ALPRAZolam (XANAX) 0.25 MG tablet Take 0.25 mg by mouth 2 (two) times daily as needed. For anxiety   Yes Historical Provider, MD  aspirin 81 MG tablet Take 81 mg by mouth daily.     Yes Historical Provider, MD  atorvastatin (LIPITOR) 80 MG tablet Take 80 mg by mouth at bedtime.     Yes Historical Provider, MD  b complex-vitamin c-folic acid (NEPHRO-VITE) 0.8 MG TABS Take 0.8 mg by mouth at bedtime.     Yes Historical Provider, MD  benzonatate (TESSALON) 200 MG capsule Take 200 mg by mouth 3 (three) times daily as  needed. For cough   Yes Historical Provider, MD  calcium acetate (PHOSLO) 667 MG capsule Take 667 mg by mouth 3 (three) times daily with meals.     Yes Historical Provider, MD  carvedilol (COREG) 6.25 MG tablet Take 6.25 mg by mouth 2 (two) times daily with a meal.   Yes Historical Provider, MD  furosemide (LASIX) 80 MG tablet Take 80 mg by mouth 4 (four) times daily.   Yes Historical Provider, MD  isosorbide dinitrate (ISORDIL) 20 MG tablet Take 40 mg by mouth 3 (three) times daily.   Yes Historical Provider, MD  levothyroxine (SYNTHROID, LEVOTHROID) 100 MCG tablet Take 100 mcg by mouth daily.    Yes Historical Provider, MD  oxyCODONE-acetaminophen (PERCOCET) 7.5-500 MG per tablet Take 1 tablet by mouth every 8 (eight) hours as needed. For pain   Yes Historical Provider, MD  cinacalcet (SENSIPAR) 30 MG tablet Take 60 mg by mouth daily.     Historical Provider, MD  clopidogrel (PLAVIX) 75 MG tablet Take 75 mg by mouth daily.      Historical Provider, MD  docusate sodium (COLACE) 100 MG capsule Take 100 mg by mouth 2 (two) times daily.      Historical Provider, MD  nitroGLYCERIN (NITROSTAT) 0.4 MG SL tablet Place 0.4 mg under the tongue every 5 (five) minutes as needed.      Historical Provider, MD    Allergies:   Allergies  Allergen Reactions  . Lisinopril Swelling  . Iohexol      Desc: itching, swelling     Social History:  reports that she quit smoking about 5 years ago. Her smoking use included Cigarettes. She smoked .5 packs per day. She does not have any smokeless tobacco history on file. She reports that she does not drink alcohol or use illicit drugs.  Family History: No family history on file.  Physical Exam: Filed Vitals:   06/20/11 1638  BP: 146/72  Pulse: 75  Temp: 97.4 F (36.3 C)  TempSrc: Oral  Resp: 20  Height: 5\' 3"  (1.6 m)  Weight: 57.6 kg (126 lb 15.8 oz)  SpO2: 69%   Constitutional: Vital signs reviewed.  Patient is a well-developed and well-nourished in no acute distress and cooperative with exam. Alert and oriented x3.  Head: Normocephalic and atraumatic Mouth: no erythema or exudates, MMM Eyes: PERRL, EOMI, conjunctivae normal, No scleral icterus.  Neck: Supple, Trachea midline normal ROM, No JVD, mass, thyromegaly, or carotid bruit present.  Cardiovascular: RRR, S1 normal, S2 normal, no MRG, pulses symmetric and intact bilaterally Pulmonary/Chest:Diffuse exp.wheezes bil. Abdominal: Soft. Non-tender, non-distended, bowel sounds are normal, no masses, organomegaly, or guarding present.  Extremities: No cyanosis or edma Neurological: A&O x3,  Strength is normal and symmetric bilaterally, cranial nerve II-XII are grossly intact, no focal motor deficit, sensory intact to light touch bilaterally.  Skin: Warm, dry and intact. No rash, cyanosis, or clubbing.  Psychiatric: Normal mood and affect. speech and behavior is normal. Judgment and thought content normal.   Labs on Admission:   [facility] 06/20/11 1804  NA 135  K 4.7  CL 93*  CO2 28  GLUCOSE 170*  BUN 51*  CREATININE 5.24*  CALCIUM 9.7  MG --  PHOS --    Basename 06/20/11 1804  AST 31  ALT 37*  ALKPHOS 51  BILITOT 0.3  PROT 6.3  ALBUMIN 3.6   No results found for this basename: LIPASE:2,AMYLASE:2 in the last 72 hours  Basename 06/20/11 1804  WBC 7.9  NEUTROABS --  HGB 10.8*  HCT 33.6*  MCV 107.0*  PLT 137*    Basename 06/20/11 1812  CKTOTAL 54  CKMB 3.3  CKMBINDEX --  TROPONINI <0.30   No results found for this basename: TSH,T4TOTAL,FREET3,T3FREE,THYROIDAB in the last 72 hours No results found for this basename: VITAMINB12:2,FOLATE:2,FERRITIN:2,TIBC:2,IRON:2,RETICCTPCT:2 in the last 72 hours  Radiological Exams on Admission: No results found.  Assessment/Plan Present on Admission:  .COPD exacerbation (chronic obstructive pulmonary disease) As dicussed above,will continue NEBS, solumedrol, supplemental O2 and add antibiotics with Avelox. Follow and further recommendatios as clinical course evolves. .CHF (congestive heart failure) - continue lasix, I have also consulted renal for her MWF dialysis. - will obtain BNP as well as cardiac enzymes and follow .ESRD (end stage renal disease) on dialysis - as discussed above .Anxiety and depression - continue xanax .Hypertension - continue outpt meds .Hyperlipidemia .Coronary artery disease Chest pain free, conitnue outpt meds. /FU CEs as above .H/O afib- s/p pacemaker in the past    Tyffani Foglesong C 06/20/2011, 5:58 PM

## 2011-06-20 NOTE — Progress Notes (Signed)
Pt admitted to floor from [facility] with c/o SOB. HX of ashtma COPD renal disease. Pt has paced HR. Admitted diagnosis COPD exacerbation. Pt on 2L O2. Pt has expiratory wheezing. Vitals stable. IV in Right A/C SL Pt has left upper arm graft ++ with dialysis days on MWF. Pt alert and oriented x3. Admitting Physician notified. Annitta Needs, RN

## 2011-06-21 ENCOUNTER — Inpatient Hospital Stay (HOSPITAL_COMMUNITY): Payer: Medicare Other

## 2011-06-21 LAB — BASIC METABOLIC PANEL
BUN: 59 mg/dL — ABNORMAL HIGH (ref 6–23)
CO2: 27 mEq/L (ref 19–32)
Calcium: 9 mg/dL (ref 8.4–10.5)
Chloride: 95 mEq/L — ABNORMAL LOW (ref 96–112)
Creatinine, Ser: 5.79 mg/dL — ABNORMAL HIGH (ref 0.50–1.10)
GFR calc Af Amer: 7 mL/min — ABNORMAL LOW (ref >90)

## 2011-06-21 LAB — CBC
HCT: 31.6 % — ABNORMAL LOW (ref 36.0–46.0)
MCHC: 32.3 g/dL (ref 30.0–36.0)
MCV: 105.3 fL — ABNORMAL HIGH (ref 78.0–100.0)
Platelets: 123 10*3/uL — ABNORMAL LOW (ref 150–400)
RDW: 16.1 % — ABNORMAL HIGH (ref 11.5–15.5)
WBC: 6.3 10*3/uL (ref 4.0–10.5)

## 2011-06-21 LAB — CARDIAC PANEL(CRET KIN+CKTOT+MB+TROPI)
CK, MB: 3.1 ng/mL (ref 0.3–4.0)
Relative Index: INVALID (ref 0.0–2.5)
Troponin I: <0.3 ng/mL (ref <0.30)

## 2011-06-21 MED ORDER — HYDROCODONE-ACETAMINOPHEN 5-325 MG PO TABS
1.0000 | ORAL_TABLET | ORAL | Status: DC | PRN
  Administered 2011-06-21 – 2011-06-26 (×6): 1 via ORAL
  Filled 2011-06-21 (×5): qty 1

## 2011-06-21 MED ORDER — CALCIUM ACETATE 667 MG PO CAPS
2001.0000 mg | ORAL_CAPSULE | Freq: Three times a day (TID) | ORAL | Status: DC
  Administered 2011-06-21 – 2011-06-26 (×14): 2001 mg via ORAL
  Filled 2011-06-21 (×18): qty 3

## 2011-06-21 MED ORDER — DARBEPOETIN ALFA-POLYSORBATE 25 MCG/0.42ML IJ SOLN
25.0000 ug | INTRAMUSCULAR | Status: DC
  Administered 2011-06-21: 25 ug via INTRAVENOUS
  Filled 2011-06-21: qty 0.42

## 2011-06-21 MED ORDER — RENA-VITE PO TABS
1.0000 | ORAL_TABLET | Freq: Every day | ORAL | Status: DC
  Administered 2011-06-22 – 2011-06-25 (×4): 1 via ORAL
  Filled 2011-06-21 (×5): qty 1

## 2011-06-21 MED ORDER — DARBEPOETIN ALFA-POLYSORBATE 25 MCG/0.42ML IJ SOLN
INTRAMUSCULAR | Status: AC
  Administered 2011-06-21: 25 ug via INTRAVENOUS
  Filled 2011-06-21: qty 0.42

## 2011-06-21 MED ORDER — MEGESTROL ACETATE 40 MG/ML PO SUSP: 200.0000 mg | Freq: Every day | ORAL | Status: DC

## 2011-06-21 NOTE — Progress Notes (Signed)
Subjective: Patient states that her shortness of breath has improved.  Objective: Vital signs in last 24 hours: Temp:  [97.1 F (36.2 C)-98.2 F (36.8 C)] 97.3 F (36.3 C) (01/18 1615) Pulse Rate:  [75-76] 75  (01/18 1615) Resp:  [10-20] 14  (01/18 1615) BP: (121-151)/(62-85) 144/69 mmHg (01/18 1615) SpO2:  [97 %-100 %] 97 % (01/18 1615) FiO2 (%):  [2 %] 2 % (01/18 1615) Weight:  [53.05 kg (116 lb 15.3 oz)-57.5 kg (126 lb 12.2 oz)] 53.05 kg (116 lb 15.3 oz) (01/18 1615) Weight change:   -Intake/Output from previous day: 01/17 0701 - 01/18 0700 In: 372 [P.O.:120; IV Piggyback:252] Out: -   Blood pressure 144/69, pulse 75, temperature 97.3 F (36.3 C), temperature source Oral, resp. rate 14, height 5\' 3"  (1.6 m), weight 53.05 kg (116 lb 15.3 oz), SpO2 97.00%. General: Patient appears her stated age. HEENT: Head normocephalic atraumatic. Lungs: Minimal rhonchi throughout. Cardiovascular: Regular rate rhythm. Abdomen: Positive bowel sounds. Extremities: Trace edema.  Lab Results: Lab Results  Component Value Date   WBC 6.3 06/21/2011   HGB 10.2* 06/21/2011   HCT 31.6* 06/21/2011   MCV 105.3* 06/21/2011   PLT 123* 06/21/2011    BMET  Lab 06/21/11 0525  NA 134*  K 5.0  CL 95*  CO2 27  BUN 59*  CREATININE 5.79*  LABGLOM --  GLUCOSE 123*  CALCIUM 9.0    Studies/Results: Portable Chest 1 View  06/20/2011  *RADIOLOGY REPORT*  Clinical Data: Shortness of breath, cough and congestion.  PORTABLE CHEST - 1 VIEW  Comparison: 10/23/2010  Findings: Stable cardiomegaly and pacemaker.  Stable chronic lung disease.  No evidence of overt edema, focal consolidation or pleural fluid.  IMPRESSION: Stable cardiomegaly and chronic lung disease.  Original Report Authenticated By: Reola Calkins, M.D.    Medications:  Current Facility-Administered Medications  Medication Dose Route Frequency Provider Last Rate Last Dose  . aspirin EC tablet 81 mg  81 mg Oral Daily Adeline C Viyuoh, MD    81 mg at 06/21/11 1000  . bisacodyl (DULCOLAX) EC tablet 5 mg  5 mg Oral Daily PRN Kela Millin, MD      . calcium acetate (PHOSLO) capsule 2,001 mg  2,001 mg Oral TID WC Dyke Maes, MD   2,001 mg at 06/21/11 1732  . carvedilol (COREG) tablet 6.25 mg  6.25 mg Oral BID WC Adeline C Viyuoh, MD   6.25 mg at 06/21/11 1732  . cinacalcet (SENSIPAR) tablet 60 mg  60 mg Oral Daily Adeline C Viyuoh, MD   60 mg at 06/21/11 1000  . clopidogrel (PLAVIX) tablet 75 mg  75 mg Oral Daily Adeline C Viyuoh, MD   75 mg at 06/21/11 1000  . darbepoetin (ARANESP) injection 25 mcg  25 mcg Intravenous Q Fri-HD Dyke Maes, MD   25 mcg at 06/21/11 1458  . docusate sodium (COLACE) capsule 100 mg  100 mg Oral BID Kela Millin, MD   100 mg at 06/21/11 1000  . enoxaparin (LOVENOX) injection 30 mg  30 mg Subcutaneous Q24H Adeline C Viyuoh, MD   30 mg at 06/20/11 2301  . eszopiclone (LUNESTA) tablet 3 mg  3 mg Oral QHS PRN Kendra P Hiatt, PHARMD      . folic acid (FOLVITE) tablet 1 mg  1 mg Oral Daily Adeline C Viyuoh, MD   1 mg at 06/21/11 1000  . furosemide (LASIX) tablet 80 mg  80 mg Oral BID Kela Millin, MD  80 mg at 06/21/11 1000  . guaiFENesin (MUCINEX) 12 hr tablet 600 mg  600 mg Oral BID Adeline C Viyuoh, MD   600 mg at 06/21/11 1000  . guaiFENesin-dextromethorphan (ROBITUSSIN DM) 100-10 MG/5ML syrup 5 mL  5 mL Oral Q4H PRN Adeline C Viyuoh, MD      . HYDROcodone-acetaminophen (NORCO) 5-325 MG per tablet 1 tablet  1 tablet Oral Q4H PRN Rilley Poulter Jarrett-Davis, MD   1 tablet at 06/21/11 1733  . isosorbide dinitrate (ISORDIL) tablet 20 mg  20 mg Oral TID Kela Millin, MD   20 mg at 06/21/11 1600  . levalbuterol (XOPENEX) nebulizer solution 0.63 mg  0.63 mg Nebulization Q6H Adeline C Viyuoh, MD   0.63 mg at 06/21/11 0904  . levalbuterol (XOPENEX) nebulizer solution 0.63 mg  0.63 mg Nebulization Q3H PRN Adeline C Viyuoh, MD      . levothyroxine (SYNTHROID, LEVOTHROID) tablet 150 mcg  150 mcg  Oral QAC breakfast Kendra P Hiatt, PHARMD      . loratadine (CLARITIN) tablet 10 mg  10 mg Oral Daily Adeline C Viyuoh, MD   10 mg at 06/21/11 1000  . methylPREDNISolone sodium succinate (SOLU-MEDROL) 40 MG injection 40 mg  40 mg Intravenous Q8H Adeline C Viyuoh, MD   40 mg at 06/21/11 1135  . moxifloxacin (AVELOX) IVPB 400 mg  400 mg Intravenous Q24H Kela Millin, MD   400 mg at 06/20/11 2029  . multivitamin (RENA-VIT) tablet 1 tablet  1 tablet Oral QHS Adeline C Viyuoh, MD      . nitroGLYCERIN (NITROSTAT) SL tablet 0.4 mg  0.4 mg Sublingual Q5 min PRN Adeline C Viyuoh, MD      . ondansetron (ZOFRAN) tablet 4 mg  4 mg Oral Q6H PRN Adeline C Viyuoh, MD       Or  . ondansetron (ZOFRAN) injection 4 mg  4 mg Intravenous Q6H PRN Kela Millin, MD   4 mg at 06/21/11 0452  . rosuvastatin (CRESTOR) tablet 40 mg  40 mg Oral Daily Adeline C Viyuoh, MD   40 mg at 06/21/11 1000  . DISCONTD: calcium acetate (PHOSLO) capsule 667 mg  667 mg Oral TID WC Adeline C Viyuoh, MD      . DISCONTD: Levothyroxine Sodium CAPS 150 mcg  1 capsule Oral Daily Adeline C Viyuoh, MD      . DISCONTD: megestrol (MEGACE) 40 MG/ML suspension 200 mg  200 mg Oral Daily Dyke Maes, MD      . DISCONTD: multivitamin (RENA-VIT) tablet 1 tablet  1 tablet Oral Daily Kela Millin, MD   1 tablet at 06/21/11 1000  . DISCONTD: NON FORMULARY 3 mg  3 mg Oral QHS PRN Adeline C Viyuoh, MD        Assessment/Plan:  COPD (chronic obstructive pulmonary disease) exacerbation: Patient started on Avelox Solu-Medrol, neb treatments and oxygen. Will continue current management.  Patient feels much better.   CHF (congestive heart failure) Continue her Lasix.   Coronary artery disease Stable. No chest pain. Continue her outpatient medications.   Hypertension Blood pressure is slightly elevated we'll continue to monitor.   Hyperlipidemia Patient is on Crestor.   Anxiety and depression Stable   ESRD (end stage renal disease)  on dialysis Patient received hemodialysis today per nephrology.   LOS: 1 day   Earlene Plater MD, [facility]  [phone] 06/21/2011, 6:03 PM

## 2011-06-21 NOTE — Consults (Signed)
Reason for Consult:ESRD, Anemia, Sec HPTH Referring Physician: Dr Alphonzo Lemmings is an 78 y.o. female.  HPI: 78 yo WF with ESRD and COPD admitted for exac of her COPD.  Non prod cough for 6 weeks which sounds more like a cough from post nasal drip.  Last HD on Wed.  Currently breathing betterr  Dialyzes at [facility] on MWF since 2003. Primary Nephrologist Hyman Hopes. EDW 53.5. HD [facility] 2k 2.25 ca,.  Past Medical History  Diagnosis Date  . Carotid artery occlusion     Bilateral carotid artery disease  . COPD (chronic obstructive pulmonary disease)   . Peripheral artery disease   . Arrhythmia     ? hx of atrial fibrillation  . Lightheadedness   . CHF (congestive heart failure)     EF 25-30%.   . Coronary artery disease 2008    post MI  . Hypertension   . Hyperlipidemia   . Chronic kidney disease 02/2007    End-stage on hemodialysis  . Dialysis patient 06/20/11    [name], F; [address] [place]  . AAA (abdominal aortic aneurysm)   . Stroke ` 2007  . Duodenal ulcer 2008  . Myocardial infarction 02/2007    "had 3 MI before my CABG"  . Heart murmur   . Anxiety   . Depression   . Pneumonia   . History of bronchitis   . SOB (shortness of breath)   . Shortness of breath on exertion   . On home oxygen therapy     prn  . Hypothyroidism   . Anemia   . Blood transfusion     Past Surgical History  Procedure Date  . Thyroid surgery 2004  . Dg av dialysis  shunt access exist*l* or 03/2000    left upper arm  . Vaginal hysterectomy 1976  . Hemorrhoid surgery 1971  . Insert / replace / remove pacemaker 1990's    initial placement  . Insert / replace / remove pacemaker 05/2011    "changed out"  . Incisional hernia repair 10/2010    incarcerated  . Common iliac 06/2009    bilateral kissing stents  . Pseudoaneurysm repair 09/1999    right groin  . Av fistula repair 04/2007    left upper arm  . Cardiac catheterization 02/10, 03/12    Most recent showed 3 vessel CAD with  patent grafts: SVG to LAD, SVG to PDA and SVG to OM  . Coronary angioplasty with stent placement 09/1999    "1"  . Coronary artery bypass graft 02/2007    CABG X3    History reviewed. No pertinent family history.  Social History:  reports that she quit smoking about 5 years ago. Her smoking use included Cigarettes. She has a 20 pack-year smoking history. She has never used smokeless tobacco. She reports that she does not drink alcohol or use illicit drugs.  Allergies:  Allergies  Allergen Reactions  . Lisinopril Swelling  . Iohexol      Desc: itching, swelling     Medications: I have reviewed the patient's current medications.  Epogen 2600 units,    Results for orders placed during the hospital encounter of 06/20/11 (from the past 48 hour(s))  CBC     Status: Abnormal   Collection Time   06/20/11  6:04 PM      Component Value Range Comment   WBC 7.9  4.0 - 10.5 (K/uL)    RBC 3.14 (*) 3.87 - 5.11 (MIL/uL)  Hemoglobin 10.8 (*) 12.0 - 15.0 (g/dL)    HCT 16.1 (*) 09.6 - 46.0 (%)    MCV 107.0 (*) 78.0 - 100.0 (fL)    MCH 34.4 (*) 26.0 - 34.0 (pg)    MCHC 32.1  30.0 - 36.0 (g/dL)    RDW 04.5 (*) 40.9 - 15.5 (%)    Platelets 137 (*) 150 - 400 (K/uL)   COMPREHENSIVE METABOLIC PANEL     Status: Abnormal   Collection Time   06/20/11  6:04 PM      Component Value Range Comment   Sodium 135  135 - 145 (mEq/L)    Potassium 4.7  3.5 - 5.1 (mEq/L)    Chloride 93 (*) 96 - 112 (mEq/L)    CO2 28  19 - 32 (mEq/L)    Glucose, Bld 170 (*) 70 - 99 (mg/dL)    BUN 51 (*) 6 - 23 (mg/dL)    Creatinine, Ser 8.11 (*) 0.50 - 1.10 (mg/dL)    Calcium 9.7  8.4 - 10.5 (mg/dL)    Total Protein 6.3  6.0 - 8.3 (g/dL)    Albumin 3.6  3.5 - 5.2 (g/dL)    AST 31  0 - 37 (U/L)    ALT 37 (*) 0 - 35 (U/L)    Alkaline Phosphatase 51  39 - 117 (U/L)    Total Bilirubin 0.3  0.3 - 1.2 (mg/dL)    GFR calc non Af Amer 7 (*) >90 (mL/min)    GFR calc Af Amer 8 (*) >90 (mL/min)   PRO B NATRIURETIC PEPTIDE      Status: Abnormal   Collection Time   06/20/11  6:05 PM      Component Value Range Comment   Pro B Natriuretic peptide (BNP) 22212.0 (*) 0 - 450 (pg/mL)   CARDIAC PANEL(CRET KIN+CKTOT+MB+TROPI)     Status: Normal   Collection Time   06/20/11  6:12 PM      Component Value Range Comment   Total CK 54  7 - 177 (U/L)    CK, MB 3.3  0.3 - 4.0 (ng/mL)    Troponin I <0.30  <0.30 (ng/mL)    Relative Index RELATIVE INDEX IS INVALID  0.0 - 2.5    CARDIAC PANEL(CRET KIN+CKTOT+MB+TROPI)     Status: Normal   Collection Time   06/21/11 12:37 AM      Component Value Range Comment   Total CK 44  7 - 177 (U/L)    CK, MB 3.1  0.3 - 4.0 (ng/mL)    Troponin I <0.30  <0.30 (ng/mL)    Relative Index RELATIVE INDEX IS INVALID  0.0 - 2.5    BASIC METABOLIC PANEL     Status: Abnormal   Collection Time   06/21/11  5:25 AM      Component Value Range Comment   Sodium 134 (*) 135 - 145 (mEq/L)    Potassium 5.0  3.5 - 5.1 (mEq/L)    Chloride 95 (*) 96 - 112 (mEq/L)    CO2 27  19 - 32 (mEq/L)    Glucose, Bld 123 (*) 70 - 99 (mg/dL)    BUN 59 (*) 6 - 23 (mg/dL)    Creatinine, Ser 9.14 (*) 0.50 - 1.10 (mg/dL)    Calcium 9.0  8.4 - 10.5 (mg/dL)    GFR calc non Af Amer 6 (*) >90 (mL/min)    GFR calc Af Amer 7 (*) >90 (mL/min)   CBC     Status: Abnormal  Collection Time   06/21/11  5:25 AM      Component Value Range Comment   WBC 6.3  4.0 - 10.5 (K/uL)    RBC 3.00 (*) 3.87 - 5.11 (MIL/uL)    Hemoglobin 10.2 (*) 12.0 - 15.0 (g/dL)    HCT 16.1 (*) 09.6 - 46.0 (%)    MCV 105.3 (*) 78.0 - 100.0 (fL)    MCH 34.0  26.0 - 34.0 (pg)    MCHC 32.3  30.0 - 36.0 (g/dL)    RDW 04.5 (*) 40.9 - 15.5 (%)    Platelets 123 (*) 150 - 400 (K/uL)     Portable Chest 1 View  06/20/2011  *RADIOLOGY REPORT*  Clinical Data: Shortness of breath, cough and congestion.  PORTABLE CHEST - 1 VIEW  Comparison: 10/23/2010  Findings: Stable cardiomegaly and pacemaker.  Stable chronic lung disease.  No evidence of overt edema, focal  consolidation or pleural fluid.  IMPRESSION: Stable cardiomegaly and chronic lung disease.  Original Report Authenticated By: Reola Calkins, M.D.    ROS: Appetite poor No CP SOB improved No change in bowels No new arthritic co No new neuropathic CO No new rash Rest ROS neg Blood pressure 121/62, pulse 75, temperature 97.4 F (36.3 C), temperature source Oral, resp. rate 18, height 5\' 3"  (1.6 m), weight 57.5 kg (126 lb 12.2 oz), SpO2 99.00%. Gen:  Awake and alert HEENT: PERRLA, EOMI Lungs: Decreased BS throughout, scattered wheezes Heart;  RRR wo MRG ABD: + BS NTND EXT: no edema.  + bruit LUA AVF Neuro: CNI M&S intact  Assessment/Plan: 1 COPD exacerbation 2 ESRD: Plan HD today 3 Hypertension: controlled, cont previous meds 4. Anemia of ESRD: cont ESA 5. Sec HPTH cont binders and sensipar    Zackarie Chason T 06/21/2011, 10:47 AM

## 2011-06-21 NOTE — Progress Notes (Signed)
Physical Therapy Evaluation Patient Details Name: [name] MRN: [id number] DOB: November 26, 1932 Today's Date: 06/21/2011  Problem List:  Patient Active Problem List  Diagnoses  . CHF (congestive heart failure)  . Coronary artery disease  . Hypertension  . Hyperlipidemia  . COPD (chronic obstructive pulmonary disease)  . Anxiety and depression  . ESRD (end stage renal disease) on dialysis    Past Medical History:  Past Medical History  Diagnosis Date  . Carotid artery occlusion     Bilateral carotid artery disease  . COPD (chronic obstructive pulmonary disease)   . Peripheral artery disease   . Arrhythmia     ? hx of atrial fibrillation  . Lightheadedness   . CHF (congestive heart failure)     EF 25-30%.   . Coronary artery disease 2008    post MI  . Hypertension   . Hyperlipidemia   . Chronic kidney disease 02/2007    End-stage on hemodialysis  . Dialysis patient 06/20/11    [name], F; [address] [place]  . AAA (abdominal aortic aneurysm)   . Stroke ` 2007  . Duodenal ulcer 2008  . Myocardial infarction 02/2007    "had 3 MI before my CABG"  . Heart murmur   . Anxiety   . Depression   . Pneumonia   . History of bronchitis   . SOB (shortness of breath)   . Shortness of breath on exertion   . On home oxygen therapy     prn  . Hypothyroidism   . Anemia   . Blood transfusion    Past Surgical History:  Past Surgical History  Procedure Date  . Thyroid surgery 2004  . Dg av dialysis  shunt access exist*l* or 03/2000    left upper arm  . Vaginal hysterectomy 1976  . Hemorrhoid surgery 1971  . Insert / replace / remove pacemaker 1990's    initial placement  . Insert / replace / remove pacemaker 05/2011    "changed out"  . Incisional hernia repair 10/2010    incarcerated  . Common iliac 06/2009    bilateral kissing stents  . Pseudoaneurysm repair 09/1999    right groin  . Av fistula repair 04/2007    left upper arm  . Cardiac catheterization 02/10, 03/12   Most recent showed 3 vessel CAD with patent grafts: SVG to LAD, SVG to PDA and SVG to OM  . Coronary angioplasty with stent placement 09/1999    "1"  . Coronary artery bypass graft 02/2007    CABG X3    PT Assessment/Plan/Recommendation PT Assessment Clinical Impression Statement: Pt is a 78 y/o female admitted for COPD exacerbation.  Pt appears to be functioning below her baseline level.  Acute PT to follow pt to maximize safety and independence with mobilty in preparation for return to home with support of paid aid.   PT Recommendation/Assessment: Patient will need skilled PT in the acute care venue PT Problem List: Decreased activity tolerance;Cardiopulmonary status limiting activity Barriers to Discharge: None PT Therapy Diagnosis : Generalized weakness;Difficulty walking PT Plan PT Frequency: Min 2X/week PT Treatment/Interventions: Gait training;Functional mobility training;Therapeutic activities;Therapeutic exercise;Patient/family education PT Recommendation Follow Up Recommendations: Home health PT Equipment Recommended: None recommended by PT PT Goals  Acute Rehab PT Goals PT Goal Formulation: With patient Time For Goal Achievement: 7 days Pt will Ambulate: 51 - 150 feet;with supervision;with rolling walker PT Goal: Ambulate - Progress: Goal set today  PT Evaluation Precautions/Restrictions  Precautions Precautions: Fall Restrictions Weight  Bearing Restrictions: No Prior Functioning  Home Living Lives With: Alone Receives Help From: Personal care attendant;Family Type of Home: House Home Layout: One level Home Access: Stairs to enter Entrance Stairs-Rails: Can reach both Entrance Stairs-Number of Steps: 2 Bathroom Shower/Tub: Tub/shower unit;Door [employer] Toilet: Standard Bathroom Accessibility: Yes How Accessible: Accessible via walker Home Adaptive Equipment: Walker - rolling;Bedside commode/3-in-1;Shower chair with back;Hand-held shower hose;Grab bars in  shower;Straight cane Prior Function Level of Independence: Needs assistance with ADLs;Needs assistance with homemaking Driving: Yes Vocation: Retired Leisure: Hobbies-no Cognition Cognition Arousal/Alertness: Awake/alert Overall Cognitive Status: Appears within functional limits for tasks assessed Sensation/Coordination Sensation Light Touch: Appears Intact Stereognosis: Not tested Hot/Cold: Not tested Proprioception: Appears Intact Coordination Gross Motor Movements are Fluid and Coordinated: Yes Fine Motor Movements are Fluid and Coordinated: Yes Extremity Assessment RUE Assessment RUE Assessment: Not tested LUE Assessment LUE Assessment: Not tested RLE Assessment RLE Assessment: Within Functional Limits LLE Assessment LLE Assessment: Within Functional Limits Mobility (including Balance) Bed Mobility Bed Mobility: Yes Supine to Sit: 7: Independent;HOB elevated (Comment degrees) (HOB at 20 degrees ) Transfers Transfers: Yes Sit to Stand: 7: Independent Stand to Sit: 7: Independent Ambulation/Gait Ambulation/Gait: Yes Ambulation/Gait Assistance: 5: Supervision Ambulation/Gait Assistance Details (indicate cue type and reason): supervision for safety no physical assistance required  Ambulation Distance (Feet): 15 Feet (15 feet x 2 trials) Assistive device: Rolling walker Gait Pattern: Step-to pattern Stairs: No Wheelchair Mobility Wheelchair Mobility: No  Posture/Postural Control Posture/Postural Control: No significant limitations Balance Balance Assessed: Yes Static Sitting Balance Static Sitting - Balance Support: No upper extremity supported;Feet supported Static Sitting - Level of Assistance: 7: Independent Exercise    End of Session PT - End of Session Equipment Utilized During Treatment: Gait belt (oxygen tank ) Activity Tolerance: Patient limited by fatigue (Pt on 2-3 L of O2 via Carbon sats in low 90s) Patient left: in chair;with call bell in reach Nurse  Communication: Mobility status for transfers;Mobility status for ambulation General Behavior During Session: [facility] [place] [facility] for tasks performed Cognition: [facility] for tasks performed  Yanely Mast 06/21/2011, 2:39 PM Avari Nevares L. Freemon Binford DPT [phone]

## 2011-06-22 LAB — TSH: TSH: 0.228 u[IU]/mL — ABNORMAL LOW (ref 0.350–4.500)

## 2011-06-22 MED ORDER — METHYLPREDNISOLONE SODIUM SUCC 125 MG IJ SOLR
80.0000 mg | Freq: Three times a day (TID) | INTRAMUSCULAR | Status: DC
  Administered 2011-06-22 (×2): 80 mg via INTRAVENOUS
  Administered 2011-06-23: 17:00:00 via INTRAVENOUS
  Administered 2011-06-23 – 2011-06-24 (×3): 80 mg via INTRAVENOUS
  Filled 2011-06-22 (×6): qty 1.28

## 2011-06-22 MED ORDER — GUAIFENESIN ER 600 MG PO TB12
1200.0000 mg | ORAL_TABLET | Freq: Two times a day (BID) | ORAL | Status: DC
  Administered 2011-06-22 – 2011-06-26 (×7): 1200 mg via ORAL
  Filled 2011-06-22 (×9): qty 2

## 2011-06-22 NOTE — Progress Notes (Signed)
Subjective:  Continues to have nonproductive cough, mild nausea this AM  Objective: Vital signs in last 24 hours: Temp:  [97 F (36.1 C)-98.2 F (36.8 C)] 98.2 F (36.8 C) (01/19 0607) Pulse Rate:  [75-78] 75  (01/19 0607) Resp:  [10-19] 18  (01/19 0607) BP: (119-151)/(62-85) 120/68 mmHg (01/19 0607) SpO2:  [96 %-99 %] 98 % (01/19 0607) FiO2 (%):  [2 %] 2 % (01/18 1615) Weight:  [53.05 kg (116 lb 15.3 oz)-55.75 kg (122 lb 14.5 oz)] 55.7 kg (122 lb 12.7 oz) (01/18 2249) Weight change: -1.85 kg (-4 lb 1.3 oz)  Intake/Output from previous day: 01/18 0701 - 01/19 0700 In: 720 [P.O.:720] Out: -    EXAM: General appearance:  Alert, in no apparent distress Resp:  Expiratory rhonchi bilaterally Cardio:  RRR GI:  + BS, soft and nontender Extremities:  No edema Access:  AVF @ LUA  Lab Results:  Basename 06/21/11 0525 06/20/11 1804  WBC 6.3 7.9  HGB 10.2* 10.8*  HCT 31.6* 33.6*  PLT 123* 137*   BMET:  Basename 06/21/11 0525 06/20/11 1804  NA 134* 135  K 5.0 4.7  CL 95* 93*  CO2 27 28  GLUCOSE 123* 170*  BUN 59* 51*  CREATININE 5.79* 5.24*  CALCIUM 9.0 9.7  ALBUMIN -- 3.6   No results found for this basename: PTH:2 in the last 72 hours Iron Studies: No results found for this basename: IRON,TIBC,TRANSFERRIN,FERRITIN in the last 72 hours  Assessment/Plan: 1.  COPD exacerbation - little improvement, CXR indicates stable chronic lung disease, former smoker for 50 years; on Solu-Medrol, Avelox, and O2, also on Lasix (non-oliguric). 2.  ESRD - on HD on MWF, stable s/p HD yesterday. 3.  Anemia - Hgb 10.2 s/p Aranesp 25 mcg yesterday.  4.  HTN/Volume - BP generally controlled, volume good s/p HD yesterday. 5.  Secondary hyperparathyroidism - on Sensipar and binders.     LOS: 2 days   LYLES,CHARLES 06/22/2011,8:26 AM  Patient seen and examined and agree with assessment and plan as above.   Vinson Moselle, MD [employer] [phone] cell 06/22/2011, 4:38  PM

## 2011-06-22 NOTE — Progress Notes (Signed)
Subjective: Still feels short of breath and with significant cough.  Objective: Vital signs in last 24 hours: Temp:  [97 F (36.1 C)-98.2 F (36.8 C)] 97.6 F (36.4 C) (01/19 0850) Pulse Rate:  [74-78] 74  (01/19 0850) Resp:  [10-19] 18  (01/19 0850) BP: (119-151)/(65-85) 133/73 mmHg (01/19 0850) SpO2:  [96 %-100 %] 100 % (01/19 0850) FiO2 (%):  [2 %] 2 % (01/18 1615) Weight:  [53.05 kg (116 lb 15.3 oz)-55.75 kg (122 lb 14.5 oz)] 55.7 kg (122 lb 12.7 oz) (01/18 2249) Weight change: -1.85 kg (-4 lb 1.3 oz) Last BM Date: 06/17/11  Intake/Output from previous day: 01/18 0701 - 01/19 0700 In: 720 [P.O.:720] Out: -      Physical Exam: General: Alert, awake, oriented x3, significant coughing throughout my exam. HEENT: No bruits, no goiter. Heart: Regular rate and rhythm, without murmurs, rubs, gallops. Lungs: Diffuse bilateral expiratory wheezes. Abdomen: Soft, nontender, nondistended, positive bowel sounds. Extremities: No clubbing cyanosis or edema with positive pedal pulses. Neuro: Grossly intact, nonfocal.    Lab Results: Basic Metabolic Panel:  Basename 06/21/11 0525 06/20/11 1804  NA 134* 135  K 5.0 4.7  CL 95* 93*  CO2 27 28  GLUCOSE 123* 170*  BUN 59* 51*  CREATININE 5.79* 5.24*  CALCIUM 9.0 9.7  MG -- --  PHOS -- --   Liver Function Tests:  [facility] 06/20/11 1804  AST 31  ALT 37*  ALKPHOS 51  BILITOT 0.3  PROT 6.3  ALBUMIN 3.6   CBC:  Basename 06/21/11 0525 06/20/11 1804  WBC 6.3 7.9  NEUTROABS -- --  HGB 10.2* 10.8*  HCT 31.6* 33.6*  MCV 105.3* 107.0*  PLT 123* 137*   Cardiac Enzymes:  Basename 06/21/11 0037 06/20/11 1812  CKTOTAL 44 54  CKMB 3.1 3.3  CKMBINDEX -- --  TROPONINI <0.30 <0.30   BNP:  Basename 06/20/11 1805  PROBNP 22212.0*   Thyroid Function Tests:  Basename 06/21/11 1812  TSH 0.228*  T4TOTAL --  FREET4 --  T3FREE --  THYROIDAB --    Studies/Results: Portable Chest 1 View  06/20/2011  *RADIOLOGY REPORT*   Clinical Data: Shortness of breath, cough and congestion.  PORTABLE CHEST - 1 VIEW  Comparison: 10/23/2010  Findings: Stable cardiomegaly and pacemaker.  Stable chronic lung disease.  No evidence of overt edema, focal consolidation or pleural fluid.  IMPRESSION: Stable cardiomegaly and chronic lung disease.  Original Report Authenticated By: Reola Calkins, M.D.    Medications: Scheduled Meds:   . aspirin EC  81 mg Oral Daily  . calcium acetate  2,001 mg Oral TID WC  . carvedilol  6.25 mg Oral BID WC  . cinacalcet  60 mg Oral Daily  . clopidogrel  75 mg Oral Daily  . darbepoetin (ARANESP) injection - DIALYSIS  25 mcg Intravenous Q Fri-HD  . docusate sodium  100 mg Oral BID  . enoxaparin  30 mg Subcutaneous Q24H  . folic acid  1 mg Oral Daily  . furosemide  80 mg Oral BID  . guaiFENesin  1,200 mg Oral BID  . isosorbide dinitrate  20 mg Oral TID  . levalbuterol  0.63 mg Nebulization Q6H  . levothyroxine  150 mcg Oral QAC breakfast  . loratadine  10 mg Oral Daily  . methylPREDNISolone (SOLU-MEDROL) injection  80 mg Intravenous Q8H  . moxifloxacin  400 mg Intravenous Q24H  . multivitamin  1 tablet Oral QHS  . rosuvastatin  40 mg Oral Daily  . DISCONTD: guaiFENesin  600 mg Oral BID  . DISCONTD: methylPREDNISolone (SOLU-MEDROL) injection  40 mg Intravenous Q8H  . DISCONTD: multivitamin  1 tablet Oral Daily   Continuous Infusions:  PRN Meds:.bisacodyl, eszopiclone, guaiFENesin-dextromethorphan, HYDROcodone-acetaminophen, levalbuterol, nitroGLYCERIN, ondansetron (ZOFRAN) IV, ondansetron  Assessment/Plan:  Active Problems:  CHF (congestive heart failure)  Coronary artery disease  Hypertension  Hyperlipidemia  COPD (chronic obstructive pulmonary disease)  Anxiety and depression  ESRD (end stage renal disease) on dialysis  #1 shortness of breath, believed secondary to acute COPD exacerbation: Still with significant cough and wheezing today. Will increase steroids and Mucinex.  Continue frequent nebulizations.  #2 end-stage renal disease: She is on hemodialysis Monday Wednesday Fridays.  #3 disposition: We'll keep in the hospital likely over the weekend. Increase steroids with hope of improving bronchospasm.   LOS: 2 days   [facility] Pager: [phone] 06/22/2011, 11:10 AM

## 2011-06-23 MED ORDER — HEPARIN SODIUM (PORCINE) 1000 UNIT/ML DIALYSIS
20.0000 [IU]/kg | INTRAMUSCULAR | Status: DC | PRN
  Administered 2011-06-24: 1100 [IU] via INTRAVENOUS_CENTRAL
  Filled 2011-06-23: qty 2

## 2011-06-23 NOTE — Progress Notes (Signed)
Subjective: Shortness of breath improved today. Still with significant coughing.  Objective: Vital signs in last 24 hours: Temp:  [97.8 F (36.6 C)-98.3 F (36.8 C)] 97.8 F (36.6 C) (01/20 0930) Pulse Rate:  [74-75] 75  (01/20 0930) Resp:  [18-19] 19  (01/20 0930) BP: (135-142)/(66-68) 135/68 mmHg (01/20 0930) SpO2:  [96 %-100 %] 100 % (01/20 0930) FiO2 (%):  [28 %] 28 % (01/20 0921) Weight:  [53.524 kg (118 lb)] 53.524 kg (118 lb) (01/19 2102) Weight change: -2.225 kg (-4 lb 14.5 oz) Last BM Date: 06/17/11  Intake/Output from previous day:   Total I/O In: 240 [P.O.:240] Out: -    Physical Exam: General: Alert, awake, oriented x3, significant coughing throughout my exam. HEENT: No bruits, no goiter. Heart: Regular rate and rhythm, without murmurs, rubs, gallops. Lungs: Mild expiratory wheezes Abdomen: Soft, nontender, nondistended, positive bowel sounds. Extremities: No clubbing cyanosis or edema with positive pedal pulses. Neuro: Grossly intact, nonfocal.    Lab Results: Basic Metabolic Panel:  Basename 06/21/11 0525 06/20/11 1804  NA 134* 135  K 5.0 4.7  CL 95* 93*  CO2 27 28  GLUCOSE 123* 170*  BUN 59* 51*  CREATININE 5.79* 5.24*  CALCIUM 9.0 9.7  MG -- --  PHOS -- --   Liver Function Tests:  [facility] 06/20/11 1804  AST 31  ALT 37*  ALKPHOS 51  BILITOT 0.3  PROT 6.3  ALBUMIN 3.6   CBC:  Basename 06/21/11 0525 06/20/11 1804  WBC 6.3 7.9  NEUTROABS -- --  HGB 10.2* 10.8*  HCT 31.6* 33.6*  MCV 105.3* 107.0*  PLT 123* 137*   Cardiac Enzymes:  Basename 06/21/11 0037 06/20/11 1812  CKTOTAL 44 54  CKMB 3.1 3.3  CKMBINDEX -- --  TROPONINI <0.30 <0.30   BNP:  Basename 06/20/11 1805  PROBNP 22212.0*   Thyroid Function Tests:  Basename 06/21/11 1812  TSH 0.228*  T4TOTAL --  FREET4 --  T3FREE --  THYROIDAB --    Studies/Results: No results found.  Medications: Scheduled Meds:    . aspirin EC  81 mg Oral Daily  . calcium  acetate  2,001 mg Oral TID WC  . carvedilol  6.25 mg Oral BID WC  . cinacalcet  60 mg Oral Daily  . clopidogrel  75 mg Oral Daily  . darbepoetin (ARANESP) injection - DIALYSIS  25 mcg Intravenous Q Fri-HD  . docusate sodium  100 mg Oral BID  . enoxaparin  30 mg Subcutaneous Q24H  . folic acid  1 mg Oral Daily  . furosemide  80 mg Oral BID  . guaiFENesin  1,200 mg Oral BID  . isosorbide dinitrate  20 mg Oral TID  . levalbuterol  0.63 mg Nebulization Q6H  . levothyroxine  150 mcg Oral QAC breakfast  . loratadine  10 mg Oral Daily  . methylPREDNISolone (SOLU-MEDROL) injection  80 mg Intravenous Q8H  . moxifloxacin  400 mg Intravenous Q24H  . multivitamin  1 tablet Oral QHS  . rosuvastatin  40 mg Oral Daily   Continuous Infusions:  PRN Meds:.bisacodyl, eszopiclone, guaiFENesin-dextromethorphan, heparin, HYDROcodone-acetaminophen, levalbuterol, nitroGLYCERIN, ondansetron (ZOFRAN) IV, ondansetron  Assessment/Plan:  Active Problems:  CHF (congestive heart failure)  Coronary artery disease  Hypertension  Hyperlipidemia  COPD (chronic obstructive pulmonary disease)  Anxiety and depression  ESRD (end stage renal disease) on dialysis  #1 shortness of breath, believed secondary to acute COPD exacerbation: Somewhat improved today. Continue steroids at current dose, Mucinex, antibiotics, frequent nebulizations.  #2 end-stage renal disease:  She is on hemodialysis Monday Wednesday Fridays.  #3 disposition: We'll keep in the hospital likely over the weekend.   LOS: 3 days   HERNANDEZ ACOSTA,ESTELA [facility] Pager: [phone] 06/23/2011, 1:32 PM

## 2011-06-23 NOTE — Progress Notes (Signed)
Subjective:   Continues to have nonproductive cough, but improving  Objective: Vital signs in last 24 hours: Temp:  [97.6 F (36.4 C)-98.3 F (36.8 C)] 98 F (36.7 C) (01/20 0618) Pulse Rate:  [74-75] 75  (01/20 0618) Resp:  [18] 18  (01/20 0618) BP: (133-142)/(66-73) 142/68 mmHg (01/20 0618) SpO2:  [96 %-100 %] 98 % (01/20 0618) FiO2 (%):  [28 %] 28 % (01/19 1528) Weight:  [53.524 kg (118 lb)] 53.524 kg (118 lb) (01/19 2102) Weight change: -2.225 kg (-4 lb 14.5 oz)     EXAM: General appearance:  Alert, in no apparent distress Resp: bilateral expiratory wheezes, rhonchi at bases Cardio: RRR GI: + BS, soft and nontender Extremities:  No edema Access:  AVF @ LUA  Lab Results:  Basename 06/21/11 0525 06/20/11 1804  WBC 6.3 7.9  HGB 10.2* 10.8*  HCT 31.6* 33.6*  PLT 123* 137*   BMET:  Basename 06/21/11 0525 06/20/11 1804  NA 134* 135  K 5.0 4.7  CL 95* 93*  CO2 27 28  GLUCOSE 123* 170*  BUN 59* 51*  CREATININE 5.79* 5.24*  CALCIUM 9.0 9.7  ALBUMIN -- 3.6   No results found for this basename: PTH:2 in the last 72 hours Iron Studies: No results found for this basename: IRON,TIBC,TRANSFERRIN,FERRITIN in the last 72 hours  Assessment/Plan: 1.  COPD exacerbation - slight improvement since yesterday, CXR indicates stable chronic lung disease, former smoker for 50 years; on Solu-Medrol, Avelox, and O2, also on Lasix (non-oliguric).  2.  ESRD - on HD on MWF, stable, last K 5 pre-HD on 1/18, next HD tomorrow.  3. Anemia - last Hgb 10.2 s/p Aranesp 25 mcg on 1/18.  4. HTN/Volume - BP generally controlled, volume good.  5. Secondary hyperparathyroidism - on Sensipar and binders.    LOS: 3 days   LYLES,CHARLES 06/23/2011,8:09 AM  Patient seen and examined and agree with assessment and plan as above.   Vinson Moselle, MD [employer] [phone] cell 06/23/2011, 2:38 PM

## 2011-06-24 ENCOUNTER — Inpatient Hospital Stay (HOSPITAL_COMMUNITY): Payer: Medicare Other

## 2011-06-24 LAB — CBC
MCV: 106.6 fL — ABNORMAL HIGH (ref 78.0–100.0)
Platelets: 126 10*3/uL — ABNORMAL LOW (ref 150–400)
RBC: 3.02 MIL/uL — ABNORMAL LOW (ref 3.87–5.11)
RDW: 16.6 % — ABNORMAL HIGH (ref 11.5–15.5)
WBC: 6.6 10*3/uL (ref 4.0–10.5)

## 2011-06-24 LAB — RENAL FUNCTION PANEL
Albumin: 3.3 g/dL — ABNORMAL LOW (ref 3.5–5.2)
BUN: 78 mg/dL — ABNORMAL HIGH (ref 6–23)
Chloride: 97 mEq/L (ref 96–112)
GFR calc Af Amer: 6 mL/min — ABNORMAL LOW (ref >90)
GFR calc non Af Amer: 5 mL/min — ABNORMAL LOW (ref >90)
Phosphorus: 6.5 mg/dL — ABNORMAL HIGH (ref 2.3–4.6)
Potassium: 5.4 mEq/L — ABNORMAL HIGH (ref 3.5–5.1)
Sodium: 135 mEq/L (ref 135–145)

## 2011-06-24 MED ORDER — PANTOPRAZOLE SODIUM 40 MG PO TBEC
40.0000 mg | DELAYED_RELEASE_TABLET | Freq: Every day | ORAL | Status: DC
  Administered 2011-06-24 – 2011-06-26 (×3): 40 mg via ORAL
  Filled 2011-06-24 (×3): qty 1

## 2011-06-24 MED ORDER — METHYLPREDNISOLONE SODIUM SUCC 125 MG IJ SOLR
60.0000 mg | Freq: Two times a day (BID) | INTRAMUSCULAR | Status: DC
  Administered 2011-06-24 – 2011-06-25 (×2): 60 mg via INTRAVENOUS
  Filled 2011-06-24 (×2): qty 0.96

## 2011-06-24 MED ORDER — MOXIFLOXACIN HCL 400 MG PO TABS
400.0000 mg | ORAL_TABLET | Freq: Every day | ORAL | Status: DC
  Administered 2011-06-24 – 2011-06-26 (×3): 400 mg via ORAL
  Filled 2011-06-24 (×3): qty 1

## 2011-06-24 MED ORDER — SENNA 8.6 MG PO TABS
2.0000 | ORAL_TABLET | Freq: Every day | ORAL | Status: DC
  Administered 2011-06-24 – 2011-06-25 (×2): 17.2 mg via ORAL
  Filled 2011-06-24 (×3): qty 2

## 2011-06-24 MED ORDER — FLEET ENEMA 7-19 GM/118ML RE ENEM
1.0000 | ENEMA | Freq: Every day | RECTAL | Status: DC | PRN
  Administered 2011-06-24: 1 via RECTAL
  Filled 2011-06-24 (×2): qty 1

## 2011-06-24 MED ORDER — BISACODYL 10 MG RE SUPP: 10.0000 mg | Freq: Every day | RECTAL | Status: DC | PRN

## 2011-06-24 MED ORDER — LEVALBUTEROL HCL 0.63 MG/3ML IN NEBU
0.6300 mg | INHALATION_SOLUTION | Freq: Three times a day (TID) | RESPIRATORY_TRACT | Status: DC
  Administered 2011-06-24 – 2011-06-26 (×6): 0.63 mg via RESPIRATORY_TRACT
  Filled 2011-06-24 (×10): qty 3

## 2011-06-24 NOTE — Progress Notes (Addendum)
Subjective: Chart reviewed. Patient denies dyspnea. She has intermittent dry coughing spells. She complains of stuffy nose. She is also constipated and is requesting an enema.  Objective: Blood pressure 156/73, pulse 71, temperature 98 F (36.7 C), temperature source Oral, resp. rate 18, height 5\' 3"  (1.6 m), weight 53.2 kg (117 lb 4.6 oz), SpO2 92.00%.  Intake/Output Summary (Last 24 hours) at 06/24/11 1757 Last data filed at 06/24/11 1510  Gross per 24 hour  Intake    600 ml  Output   2422 ml  Net  -1822 ml    General Exam: Comfortable. Respiratory System: Bilateral fair breath sounds. Bilateral few expiratory rhonchi. No increased work of breathing. Cardiovascular System: First and second heart sounds heard. Regular rate and rhythm. No JVD/murmurs. Telemetry shows ventricular paced rhythm Gastrointestinal System: Abdomen is non distended, soft and normal bowel sounds heard. Central Nervous System: Alert and oriented. No focal neurological deficits.  Lab Results: Basic Metabolic Panel:  Basename 06/24/11 1226  NA 135  K 5.4*  CL 97  CO2 24  GLUCOSE 130*  BUN 78*  CREATININE 6.73*  CALCIUM 10.4  MG --  PHOS 6.5*   Liver Function Tests:  Basename 06/24/11 1226  AST --  ALT --  ALKPHOS --  BILITOT --  PROT --  ALBUMIN 3.3*   No results found for this basename: LIPASE:2,AMYLASE:2 in the last 72 hours No results found for this basename: AMMONIA:2 in the last 72 hours CBC:  Basename 06/24/11 1226  WBC 6.6  NEUTROABS --  HGB 10.5*  HCT 32.2*  MCV 106.6*  PLT 126*   Cardiac Enzymes: No results found for this basename: CKTOTAL:3,CKMB:3,CKMBINDEX:3,TROPONINI:3 in the last 72 hours BNP: No results found for this basename: PROBNP:3 in the last 72 hours D-Dimer: No results found for this basename: DDIMER:2 in the last 72 hours CBG: No results found for this basename: GLUCAP:6 in the last 72 hours Hemoglobin A1C: No results found for this basename: HGBA1C in the  last 72 hours Fasting Lipid Panel: No results found for this basename: CHOL,HDL,LDLCALC,TRIG,CHOLHDL,LDLDIRECT in the last 72 hours Thyroid Function Tests:  Basename 06/21/11 1812  TSH 0.228*  T4TOTAL --  FREET4 --  T3FREE --  THYROIDAB --   Anemia Panel: No results found for this basename: VITAMINB12,FOLATE,FERRITIN,TIBC,IRON,RETICCTPCT in the last 72 hours Coagulation: No results found for this basename: LABPROT:2,INR:2 in the last 72 hours Urine Drug Screen: Drugs of Abuse  No results found for this basename: labopia,  cocainscrnur,  labbenz,  amphetmu,  thcu,  labbarb    Alcohol Level: No results found for this basename: ETH:2 in the last 72 hours  Micro Results: No results found for this or any previous visit (from the past 240 hour(s)).  Studies/Results: Portable Chest 1 View  06/20/2011  *RADIOLOGY REPORT*  Clinical Data: Shortness of breath, cough and congestion.  PORTABLE CHEST - 1 VIEW  Comparison: 10/23/2010  Findings: Stable cardiomegaly and pacemaker.  Stable chronic lung disease.  No evidence of overt edema, focal consolidation or pleural fluid.  IMPRESSION: Stable cardiomegaly and chronic lung disease.  Original Report Authenticated By: Reola Calkins, M.D.    Medications: Scheduled Meds:    . aspirin EC  81 mg Oral Daily  . calcium acetate  2,001 mg Oral TID WC  . carvedilol  6.25 mg Oral BID WC  . cinacalcet  60 mg Oral Daily  . clopidogrel  75 mg Oral Daily  . darbepoetin (ARANESP) injection - DIALYSIS  25 mcg Intravenous Q  Fri-HD  . docusate sodium  100 mg Oral BID  . enoxaparin  30 mg Subcutaneous Q24H  . folic acid  1 mg Oral Daily  . furosemide  80 mg Oral BID  . guaiFENesin  1,200 mg Oral BID  . isosorbide dinitrate  20 mg Oral TID  . levalbuterol  0.63 mg Nebulization TID  . levothyroxine  150 mcg Oral QAC breakfast  . loratadine  10 mg Oral Daily  . methylPREDNISolone (SOLU-MEDROL) injection  80 mg Intravenous Q8H  . moxifloxacin  400 mg  Intravenous Q24H  . multivitamin  1 tablet Oral QHS  . pantoprazole  40 mg Oral Q1200  . rosuvastatin  40 mg Oral Daily  . DISCONTD: levalbuterol  0.63 mg Nebulization Q6H   Continuous Infusions:  PRN Meds:.bisacodyl, eszopiclone, guaiFENesin-dextromethorphan, heparin, HYDROcodone-acetaminophen, levalbuterol, nitroGLYCERIN, ondansetron (ZOFRAN) IV, ondansetron  Assessment/Plan: 1. Acute exacerbation of COPD: Improving. We'll change antibiotics to by mouth. We'll taper steroid is and change to by mouth tomorrow. 2. End-stage renal disease: Above BMP was pre dialysis. Patient is status post dialysis today nephrology team continues to see. 3. Anemia: Stable 4. Hypertension: Controlled 5. Chronic systolic congestive heart failure secondary to cardiomyopathy: Clinically compensated. 6. Constipation: Start bowel regimen. 7. Thrombocytopenia: Stable 8. Hyperkalemia secondary to end-stage renal disease: Patient is status post dialysis. Management per nephrology.  Disposition: Possible discharge in the next 24-48 hours.     Coden Franchi 06/24/2011, 5:57 PM

## 2011-06-24 NOTE — Progress Notes (Addendum)
S:Still with productive cough.  Up walking some O:BP 154/78  Pulse 75  Temp(Src) 98 F (36.7 C) (Oral)  Resp 20  Ht 5\' 3"  (1.6 m)  Wt 53.4 kg (117 lb 11.6 oz)  BMI 20.85 kg/m2  SpO2 93%  Intake/Output Summary (Last 24 hours) at 06/24/11 1117 Last data filed at 06/24/11 0900  Gross per 24 hour  Intake    600 ml  Output      0 ml  Net    600 ml   Weight change: -0.124 kg (-4.4 oz) KGM:WNUUV and alert RESP: Basilar crackles, scattered wheezes Card: RRR Abd:+BS NTND Ext:No edema  + bruit Lt UA AVF NEURO:CNI ox3      . aspirin EC  81 mg Oral Daily  . calcium acetate  2,001 mg Oral TID WC  . carvedilol  6.25 mg Oral BID WC  . cinacalcet  60 mg Oral Daily  . clopidogrel  75 mg Oral Daily  . darbepoetin (ARANESP) injection - DIALYSIS  25 mcg Intravenous Q Fri-HD  . docusate sodium  100 mg Oral BID  . enoxaparin  30 mg Subcutaneous Q24H  . folic acid  1 mg Oral Daily  . furosemide  80 mg Oral BID  . guaiFENesin  1,200 mg Oral BID  . isosorbide dinitrate  20 mg Oral TID  . levalbuterol  0.63 mg Nebulization TID  . levothyroxine  150 mcg Oral QAC breakfast  . loratadine  10 mg Oral Daily  . methylPREDNISolone (SOLU-MEDROL) injection  80 mg Intravenous Q8H  . moxifloxacin  400 mg Intravenous Q24H  . multivitamin  1 tablet Oral QHS  . rosuvastatin  40 mg Oral Daily  . DISCONTD: levalbuterol  0.63 mg Nebulization Q6H   No results found. BMET    Component Value Date/Time   NA 134* 06/21/2011 0525   K 5.0 06/21/2011 0525   CL 95* 06/21/2011 0525   CO2 27 06/21/2011 0525   GLUCOSE 123* 06/21/2011 0525   BUN 59* 06/21/2011 0525   CREATININE 5.79* 06/21/2011 0525   CALCIUM 9.0 06/21/2011 0525   GFRNONAA 6* 06/21/2011 0525   GFRAA 7* 06/21/2011 0525   CBC    Component Value Date/Time   WBC 6.3 06/21/2011 0525   RBC 3.00* 06/21/2011 0525   HGB 10.2* 06/21/2011 0525   HCT 31.6* 06/21/2011 0525   PLT 123* 06/21/2011 0525   MCV 105.3* 06/21/2011 0525   MCH 34.0 06/21/2011 0525   MCHC 32.3 06/21/2011 0525   RDW 16.1* 06/21/2011 0525   LYMPHSABS 1.3 06/29/2009 0405   MONOABS 0.8 06/29/2009 0405   EOSABS 0.1 06/29/2009 0405   BASOSABS 0.0 06/29/2009 0405     Assessment:  1. Exac COPD 2. ESRD 3. HTN 4. Anemia on aranesp 5. Sec HPTH on binders and sensipar   Plan: 1. HD today 2.Recheck labs at HD 3. Could change to po antibiotics 4. Add PPI in case reflux is causing some of her cough   Verner Mccrone T

## 2011-06-24 NOTE — Procedures (Signed)
Pt seen at end of HD.  BP 149/58. Doesn't feel like she is ready for DC due to coughing spells.

## 2011-06-25 LAB — BASIC METABOLIC PANEL
CO2: 28 mEq/L (ref 19–32)
Chloride: 100 mEq/L (ref 96–112)
Creatinine, Ser: 4.69 mg/dL — ABNORMAL HIGH (ref 0.50–1.10)
GFR calc Af Amer: 10 mL/min — ABNORMAL LOW (ref >90)
Potassium: 4.4 mEq/L (ref 3.5–5.1)
Sodium: 139 mEq/L (ref 135–145)

## 2011-06-25 MED ORDER — PREDNISONE 50 MG PO TABS
50.0000 mg | ORAL_TABLET | Freq: Every day | ORAL | Status: DC
  Administered 2011-06-25 – 2011-06-26 (×2): 50 mg via ORAL
  Filled 2011-06-25 (×4): qty 1

## 2011-06-25 MED ORDER — OXYMETAZOLINE HCL 0.05 % NA SOLN
1.0000 | Freq: Two times a day (BID) | NASAL | Status: DC
  Administered 2011-06-26 (×2): 1 via NASAL
  Filled 2011-06-25: qty 15

## 2011-06-25 NOTE — Progress Notes (Signed)
Subjective: Patient feels drained today. Denies dyspnea but still complains of nasal stuffiness. Uses oxygen on a when necessary basis at home. Has 20 pack year smoking history and quit about 6 years ago.  Objective: Blood pressure 135/60, pulse 75, temperature 98.4 F (36.9 C), temperature source Oral, resp. rate 18, height 5\' 3"  (1.6 m), weight 53.2 kg (117 lb 4.6 oz), SpO2 94.00%.  Intake/Output Summary (Last 24 hours) at 06/25/11 1711 Last data filed at 06/25/11 1300  Gross per 24 hour  Intake    240 ml  Output      0 ml  Net    240 ml    General Exam: Comfortable. Respiratory System: Bilateral fair breath sounds. No rhonchi, wheezes or crackles. No increased work of breathing. Cardiovascular System: First and second heart sounds heard. Regular rate and rhythm. No JVD/murmurs.  Gastrointestinal System: Abdomen is non distended, soft and normal bowel sounds heard. Central Nervous System: Alert and oriented. No focal neurological deficits.  Lab Results: Basic Metabolic Panel:  Basename 06/25/11 0640 06/24/11 1226  NA 139 135  K 4.4 5.4*  CL 100 97  CO2 28 24  GLUCOSE 106* 130*  BUN 46* 78*  CREATININE 4.69* 6.73*  CALCIUM 9.3 10.4  MG -- --  PHOS -- 6.5*   Liver Function Tests:  Basename 06/24/11 1226  AST --  ALT --  ALKPHOS --  BILITOT --  PROT --  ALBUMIN 3.3*   No results found for this basename: LIPASE:2,AMYLASE:2 in the last 72 hours No results found for this basename: AMMONIA:2 in the last 72 hours CBC:  Basename 06/24/11 1226  WBC 6.6  NEUTROABS --  HGB 10.5*  HCT 32.2*  MCV 106.6*  PLT 126*   Cardiac Enzymes: No results found for this basename: CKTOTAL:3,CKMB:3,CKMBINDEX:3,TROPONINI:3 in the last 72 hours BNP: No results found for this basename: PROBNP:3 in the last 72 hours D-Dimer: No results found for this basename: DDIMER:2 in the last 72 hours CBG: No results found for this basename: GLUCAP:6 in the last 72 hours Hemoglobin A1C: No  results found for this basename: HGBA1C in the last 72 hours Fasting Lipid Panel: No results found for this basename: CHOL,HDL,LDLCALC,TRIG,CHOLHDL,LDLDIRECT in the last 72 hours Thyroid Function Tests: No results found for this basename: TSH,T4TOTAL,FREET4,T3FREE,THYROIDAB in the last 72 hours Anemia Panel: No results found for this basename: VITAMINB12,FOLATE,FERRITIN,TIBC,IRON,RETICCTPCT in the last 72 hours Coagulation: No results found for this basename: LABPROT:2,INR:2 in the last 72 hours Urine Drug Screen: Drugs of Abuse  No results found for this basename: labopia,  cocainscrnur,  labbenz,  amphetmu,  thcu,  labbarb    Alcohol Level: No results found for this basename: ETH:2 in the last 72 hours  Micro Results: No results found for this or any previous visit (from the past 240 hour(s)).  Studies/Results: Portable Chest 1 View  06/20/2011  *RADIOLOGY REPORT*  Clinical Data: Shortness of breath, cough and congestion.  PORTABLE CHEST - 1 VIEW  Comparison: 10/23/2010  Findings: Stable cardiomegaly and pacemaker.  Stable chronic lung disease.  No evidence of overt edema, focal consolidation or pleural fluid.  IMPRESSION: Stable cardiomegaly and chronic lung disease.  Original Report Authenticated By: Reola Calkins, M.D.    Medications: Scheduled Meds:    . aspirin EC  81 mg Oral Daily  . calcium acetate  2,001 mg Oral TID WC  . carvedilol  6.25 mg Oral BID WC  . cinacalcet  60 mg Oral Daily  . clopidogrel  75 mg Oral  Daily  . darbepoetin (ARANESP) injection - DIALYSIS  25 mcg Intravenous Q Fri-HD  . docusate sodium  100 mg Oral BID  . enoxaparin  30 mg Subcutaneous Q24H  . folic acid  1 mg Oral Daily  . furosemide  80 mg Oral BID  . guaiFENesin  1,200 mg Oral BID  . isosorbide dinitrate  20 mg Oral TID  . levalbuterol  0.63 mg Nebulization TID  . levothyroxine  150 mcg Oral QAC breakfast  . loratadine  10 mg Oral Daily  . methylPREDNISolone (SOLU-MEDROL) injection   60 mg Intravenous Q12H  . moxifloxacin  400 mg Oral q1800  . multivitamin  1 tablet Oral QHS  . pantoprazole  40 mg Oral Q1200  . rosuvastatin  40 mg Oral Daily  . senna  2 tablet Oral QHS  . DISCONTD: methylPREDNISolone (SOLU-MEDROL) injection  80 mg Intravenous Q8H  . DISCONTD: moxifloxacin  400 mg Intravenous Q24H   Continuous Infusions:  PRN Meds:.bisacodyl, eszopiclone, guaiFENesin-dextromethorphan, heparin, HYDROcodone-acetaminophen, levalbuterol, nitroGLYCERIN, ondansetron (ZOFRAN) IV, ondansetron, DISCONTD: bisacodyl, DISCONTD: sodium phosphate  Assessment/Plan: 1. Acute exacerbation of COPD: Improving. We'll change to oral steroids. Continue oral Avelox. Provide flutter valve. Titrate oxygen down to discontinue if maintains saturation greater than 90% and then can use as needed. Nasal Afrin. Will discuss with the case manager regarding arranging home nebulizer. 2. End-stage renal disease: Management per nephrology. 3. Anemia: Stable 4. Hypertension: Controlled 5. Chronic systolic congestive heart failure secondary to cardiomyopathy: Clinically compensated. 6. Constipation: Continue bowel regimen. 7. Thrombocytopenia: Stable 8. Hyperkalemia secondary to end-stage renal disease: Resolved.  Disposition: Possible discharge in the next 24-48 hours.     [name] 06/25/2011, 5:11 PM

## 2011-06-25 NOTE — Progress Notes (Signed)
Physical Therapy Treatment Patient Details Name: [name] MRN: [id number] DOB: [birth year] Today's Date: 06/25/2011  PT Assessment/Plan  PT - Assessment/Plan Comments on Treatment Session: Pt participating well in therapy, but activity tolerance limited by fatigue and LBP. Pt moves slowly and safely, and will continue to beneifit from strengthening and gait training. VSS throughout  PT Plan: Discharge plan remains appropriate Follow Up Recommendations: Home health PT Equipment Recommended: None recommended by PT PT Goals  Acute Rehab PT Goals Pt will Ambulate: with modified independence;51 - 150 feet;with rolling walker (150') PT Goal: Ambulate - Progress: Updated due to goal met  PT Treatment Precautions/Restrictions  Precautions Precautions: Fall Restrictions Weight Bearing Restrictions: No Mobility (including Balance) Bed Mobility Supine to Sit: Other (comment) (Pt sitting EOB upon arrival) Transfers Sit to Stand: 5: Supervision;With upper extremity assist;From bed Sit to Stand Details (indicate cue type and reason): Cues for safe hand placement Stand to Sit: 5: Supervision Stand to Sit Details: Cues for hand placement and to bring walker all the way through turn Ambulation/Gait Ambulation/Gait: Yes Ambulation/Gait Assistance: 5: Supervision Ambulation/Gait Assistance Details (indicate cue type and reason): Cues for RW proximity  Ambulation Distance (Feet): 80 Feet Assistive device: Rolling walker Gait Pattern: Step-through pattern;Decreased stride length Gait velocity: <9ft/sec Stairs: No Wheelchair Mobility Wheelchair Mobility: No    Exercise  General Exercises - Lower Extremity Ankle Circles/Pumps: Seated;20 reps;Both;Strengthening;AROM Long Arc Quad: Seated;20 reps;Both;Strengthening;AROM Hip Flexion/Marching: Seated;Standing;Both;Strengthening;AROM (20 reps) Heel Raises: Standing;10 reps;Both;Strengthening;AROM Mini-Sqauts: Standing;10  reps;Both;Strengthening;AROM End of Session PT - End of Session Equipment Utilized During Treatment: Gait belt Activity Tolerance: Patient limited by fatigue Patient left: in bed;with call bell in reach (Pt states back pain increased sitting in chair) Nurse Communication: Mobility status for ambulation (Back pain) General Behavior During Session: [facility] for tasks performed Cognition: [facility] for tasks performed  [facility], [place] [phone]  06/25/2011, 3:00 PM

## 2011-06-25 NOTE — Progress Notes (Signed)
Subjective:  C/o ongoing nasal stuffiness and chest congestion: nonproductive cough that "sometimes chokes me trying to get it up"; bowel movement last pm  Vital signs in last 24 hours: Filed Vitals:   06/24/11 1800 06/24/11 2252 06/25/11 0452 06/25/11 0918  BP: 148/70 147/70 137/73   Pulse: 72 75 77   Temp: 98.2 F (36.8 C) 97.7 F (36.5 C) 98.4 F (36.9 C)   TempSrc: Oral Oral Oral   Resp: 18 18 18    Height:      Weight:      SpO2: 92% 97% 99% 98%   Weight change: 1.96 kg (4 lb 5.1 oz)  Intake/Output Summary (Last 24 hours) at 06/25/11 0950 Last data filed at 06/25/11 0643  Gross per 24 hour  Intake    240 ml  Output   2422 ml  Net  -2182 ml   Labs: Basic Metabolic Panel:  Lab 06/25/11 8295 06/24/11 1226 06/21/11 0525  NA 139 135 134*  K 4.4 5.4* 5.0  CL 100 97 95*  CO2 28 24 27   GLUCOSE 106* 130* 123*  BUN 46* 78* 59*  CREATININE 4.69* 6.73* 5.79*  CALCIUM 9.3 10.4 9.0  ALB -- -- --  PHOS -- 6.5* --   Liver Function Tests:  Lab 06/24/11 1226 06/20/11 1804  AST -- 31  ALT -- 37*  ALKPHOS -- 51  BILITOT -- 0.3  PROT -- 6.3  ALBUMIN 3.3* 3.6   No results found for this basename: LIPASE:3,AMYLASE:3 in the last 168 hours No results found for this basename: AMMONIA:3 in the last 168 hours CBC:  Lab 06/24/11 1226 06/21/11 0525 06/20/11 1804  WBC 6.6 6.3 7.9  NEUTROABS -- -- --  HGB 10.5* 10.2* 10.8*  HCT 32.2* 31.6* 33.6*  MCV 106.6* 105.3* 107.0*  PLT 126* 123* 137*   Cardiac Enzymes:  Lab 06/21/11 0037 06/20/11 1812  CKTOTAL 44 54  CKMB 3.1 3.3  CKMBINDEX -- --  TROPONINI <0.30 <0.30   CBG: No results found for this basename: GLUCAP:5 in the last 168 hours  Iron Studies: No results found for this basename: IRON,TIBC,TRANSFERRIN,FERRITIN in the last 72 hours Studies/Results: No results found. Medications:      . aspirin EC  81 mg Oral Daily  . calcium acetate  2,001 mg Oral TID WC  . carvedilol  6.25 mg Oral BID WC  . cinacalcet  60 mg  Oral Daily  . clopidogrel  75 mg Oral Daily  . darbepoetin (ARANESP) injection - DIALYSIS  25 mcg Intravenous Q Fri-HD  . docusate sodium  100 mg Oral BID  . enoxaparin  30 mg Subcutaneous Q24H  . folic acid  1 mg Oral Daily  . furosemide  80 mg Oral BID  . guaiFENesin  1,200 mg Oral BID  . isosorbide dinitrate  20 mg Oral TID  . levalbuterol  0.63 mg Nebulization TID  . levothyroxine  150 mcg Oral QAC breakfast  . loratadine  10 mg Oral Daily  . methylPREDNISolone (SOLU-MEDROL) injection  60 mg Intravenous Q12H  . moxifloxacin  400 mg Oral q1800  . multivitamin  1 tablet Oral QHS  . pantoprazole  40 mg Oral Q1200  . rosuvastatin  40 mg Oral Daily  . senna  2 tablet Oral QHS  . DISCONTD: methylPREDNISolone (SOLU-MEDROL) injection  80 mg Intravenous Q8H  . DISCONTD: moxifloxacin  400 mg Intravenous Q24H    I  have reviewed scheduled and prn medications.  Physical Exam: General: comfortable  Heart: RRR  Lungs: coarse bilat with scattered rhonchi and few expiratory wheezes Abdomen: soft, NT/ND, +BS Extremities: no ankle edema Dialysis Access: LUA AVF, +T/B  Problem/Plan: 1. Acute exacerbation of COPD: Improving but still has nonprod cough and wheezing; changed to po antibiotics (Avelox) and steroid taper to po; on Mucinex 1,200mg  BID; getting neb treatments; defer to primary  2. Sinus Pain/Pressure/Congestion- suspect secondary to nasal congestion and aggravated by oxygen use; encouraged O2 use prn 3. End-stage renal disease: MWF [place] 4. Anemia: Hgb ^ 10.5, on ESA ( wkly); follow trend 5. Hypertension/Volume/Chronic systolic congestive heart failure secondary to cardiomyopathy: Clinically compensated , BP controlled current meds and Max UF with each HD treatment 6. Secondary hyperparathyroidism - phos 6.5 yesterday, ca+ 9.3 today, but 10.4 yest; on 3 Phoslo with meals, Sensipar,follow ca/phos closely and consider changing to non-ca+ binder  7. Constipation- improved 8.  Nutrition - albumin 3.3, appetite fair; follow 9. Disposition: per primary services; still wheezing with nonproductive cough; suspect within next 24-48 hours.  Samuel Germany, FNP-C [facility] Pager [phone]  06/25/2011,9:50 AM  LOS: 5 days     I have seen and examined this patient and agree with plan .  A nebulized tx helps when she has her coughing spells and given her severe underlying lung disease , it may be a good idea to have nebulized treatments at home. Melek Pownall T,MD 06/25/2011 10:46 AM

## 2011-06-26 ENCOUNTER — Inpatient Hospital Stay (HOSPITAL_COMMUNITY): Payer: Medicare Other

## 2011-06-26 LAB — CBC
HCT: 32.6 % — ABNORMAL LOW (ref 36.0–46.0)
Hemoglobin: 10.7 g/dL — ABNORMAL LOW (ref 12.0–15.0)
MCV: 106.2 fL — ABNORMAL HIGH (ref 78.0–100.0)
RBC: 3.07 MIL/uL — ABNORMAL LOW (ref 3.87–5.11)
RDW: 16.8 % — ABNORMAL HIGH (ref 11.5–15.5)
WBC: 7.2 10*3/uL (ref 4.0–10.5)

## 2011-06-26 LAB — RENAL FUNCTION PANEL
Albumin: 3.1 g/dL — ABNORMAL LOW (ref 3.5–5.2)
BUN: 66 mg/dL — ABNORMAL HIGH (ref 6–23)
CO2: 24 mEq/L (ref 19–32)
Chloride: 97 mEq/L (ref 96–112)
Creatinine, Ser: 5.85 mg/dL — ABNORMAL HIGH (ref 0.50–1.10)
GFR calc non Af Amer: 6 mL/min — ABNORMAL LOW (ref >90)
Potassium: 5 mEq/L (ref 3.5–5.1)

## 2011-06-26 MED ORDER — FUROSEMIDE 80 MG PO TABS: 80.0000 mg | ORAL_TABLET | Freq: Two times a day (BID) | ORAL | Status: DC

## 2011-06-26 MED ORDER — FOLIC ACID 1 MG PO TABS: 1.0000 mg | ORAL_TABLET | Freq: Every day | ORAL | Status: AC

## 2011-06-26 MED ORDER — PREDNISONE 10 MG PO TABS: ORAL_TABLET | ORAL | Status: AC

## 2011-06-26 MED ORDER — TIOTROPIUM BROMIDE MONOHYDRATE 18 MCG IN CAPS: 18.0000 ug | ORAL_CAPSULE | Freq: Every day | RESPIRATORY_TRACT | Status: DC

## 2011-06-26 MED ORDER — HYDROCODONE-ACETAMINOPHEN 5-325 MG PO TABS
ORAL_TABLET | ORAL | Status: AC
  Administered 2011-06-26: 1 via ORAL
  Filled 2011-06-26: qty 1

## 2011-06-26 MED ORDER — ALBUTEROL SULFATE (2.5 MG/3ML) 0.083% IN NEBU: 2.5000 mg | INHALATION_SOLUTION | Freq: Four times a day (QID) | RESPIRATORY_TRACT | Status: DC | PRN

## 2011-06-26 MED ORDER — ISOSORBIDE DINITRATE 20 MG PO TABS: 20.0000 mg | ORAL_TABLET | Freq: Three times a day (TID) | ORAL | Status: DC

## 2011-06-26 MED ORDER — LEVOTHYROXINE SODIUM 150 MCG PO CAPS: 1.0000 | ORAL_CAPSULE | Freq: Every day | ORAL | Status: DC

## 2011-06-26 MED ORDER — ESZOPICLONE 3 MG PO TABS: 3.0000 mg | ORAL_TABLET | Freq: Every evening | ORAL | Status: DC | PRN

## 2011-06-26 NOTE — Progress Notes (Signed)
06/26/2011 [facility], Bosie Clos [facility] Case Management Note [phone]    CARE MANAGEMENT NOTE 06/26/2011  Patient:  [name], [name]   Account Number:  [id number]  Date Initiated:  06/25/2011  Documentation initiated by:  [facility]  Subjective/Objective Assessment:   Admitted with COPD exacerbation. Lives alone,has family close by. Drives self to hemodialysis, MWF. Has home O2 for prn thru [facility].     Action/Plan:   Anticipated DC Date:  06/26/2011   Anticipated DC Plan:  HOME/SELF CARE      DC Planning Services  CM consult      PAC Choice  DURABLE MEDICAL EQUIPMENT  HOME HEALTH   Choice offered to / List presented to:  C-1 Patient   DME arranged  NEBULIZER MACHINE  NEBULIZER/MEDS      DME agency  [facility]     [facility] arranged  HH-1 RN      [facility] agency  [facility]   Status of service:  In process, will continue to follow Medicare Important Message given?   (If response is "NO", the following Medicare IM given date fields will be blank) Date Medicare IM given:   Date Additional Medicare IM given:    Discharge Disposition:  HOME W HOME HEALTH SERVICES  Per UR Regulation:  Reviewed for med. necessity/level of care/duration of stay  Comments:  PCP Dr. Donnel Saxon  06/26/11-1354-J.Leyah Bocchino,RN,[name] [phone]      In to speak with patient regarding recommendations for home health services. Choice of facilities given to patient with choice being [facility]. Hilda Lias, RN and Derrian with [facility] notified of referral. No further discharge needs identified. Pt to be discharged home today.

## 2011-06-26 NOTE — Progress Notes (Signed)
06/26/2011 [name] Case Management Note [phone]  MEDICARE-CERTIFED HOME HEALTH AGENCIES [facility]   Agencies that are Medicare-Certified and affiliated with [facility]  Home Health Agency  Telephone Number Address  [facility]    [facility] has ownership interest  in this company; however, you are under no obligation to use this agency. [phone]  [address] [place], [place] [address]   Agencies that are Medicare-Certified and are not affiliated with [facility]  Telephone Number Address  [facility] [phone] Fax [phone] [address] [place], [place]  [address]  [facility] [phone] [address] [place], [place]  [address]  [facility] [phone] Fax [phone] [address] [place], [place]  [address]  [facility] [phone] Fax [phone] [address] [place], [place] [address]  [facility] [phone] or  [phone] [address] [place], [place] [address]  [facility] [phone] Fax [phone] [address] [place], [place] [address]  [facility] [phone]  [address] [place], [place] [address]  [facility] [phone] or  [phone] [address] [place], [place] [address]   [facility]  [phone] [address] [place], [place]  [address]  [facility] [phone] Fax [phone] [address] [place], [place]  [address]       Agencies that are NOT Medicare-Certified and are not affiliated with [facility]  Telephone Number Address  [facility] [phone] or [phone] Fax [phone] [address] [place], [place]  [address]  [facility] [phone] Fax [phone] [address] [place], [place]  [address]  [facility] [phone] Fax [phone] [address] [place], [place]  [address]  [facility]  [phone] [address] [place], [place]  [facility] [phone] or [phone] [address] [place], [place]  [address]  [facility] [phone] Fax [phone] [address] [place], [place]  [address]  [facility] [phone] Fax [phone] [address] [place], [place]  [address]   In to offer choice of home health agencies to patient for [facility] RN and nebulizer therapy. Patient chose [facility]. Hilda Lias, RN and Derrian with [facility] notified of referral.

## 2011-06-26 NOTE — Procedures (Signed)
Pt seen on HD via LUA AVF BFR 355, goal UP 3.5 Liters.  BP stable, breathing better.  Exam without wheezes or rhonchi.  Hopefully can arrange for home nebulizer.  Cont with HD qMWF

## 2011-06-26 NOTE — Discharge Summary (Signed)
Discharge Summary  [name] MR#: [id number]  DOB:1932/12/23  Date of Admission: 06/20/2011 Date of Discharge: 06/26/2011  Patient's PCP: Galvin Proffer, MD, MD  Attending Physician:Vernessa Likes  Consults: Nephrology: Dr. Primitivo Gauze  Discharge Diagnoses: 1. Acute exacerbation of COPD 2. End-stage renal disease on dialysis 3. History of chronic diastolic congestive heart failure 4. Status post pacemaker 5. History of anxiety and depression 6. Hypertension 7. Hyperlipidemia    Brief Admitting History and Physical 78yo with past medical history significant for COPD, ESRD (MWF dialysis) , CHF who presents with worsening dyspnea She states that for the past few weeks she has had a woreningcough- mostly nonproductive and nasal congestion with a postnasal drip. She also developed SOB that has worsened - on Monday(5/14) she saw MD and was started on an antibiotic and steroid, but her SOB continue to worsen and so she came to [facility] ED today. She was noted to have bilateral wheezing on her exam, and was treated for COPD exacerbatiion with nebs, solumedrol. with some improvement but continued to be symptomatic and so rquired admission for further eval and management.She also received IV lasix at [facility]. Because she is dialysis patient she was transferred to [facility] as they do not have dialysis services. CXR done at [facility] showed stable vascular congestion and mild perihilar increased interstitial markings suspicious for early edema, ABG showed pH 7.41, PCO2 40, PO2 67. She IS ON HOME O2 but states that she only uses it PRN.   Discharge Medications Current Discharge Medication List    START taking these medications   Details  albuterol (PROVENTIL) (2.5 MG/3ML) 0.083% nebulizer solution Take 3 mLs (2.5 mg total) by nebulization every 6 (six) hours as needed for wheezing. Qty: 75 mL, Refills: 0    predniSONE (DELTASONE) 10 MG tablet Take 5 tablets daily for 3 days, then 4  tablets daily for 3 days, then 3 tablets daily for 3 days, then 2 tablets daily for 3 days, then 1 tablet daily for 3 days and then stop. Qty: 45 tablet, Refills: 0      CONTINUE these medications which have CHANGED   Details  Eszopiclone (ESZOPICLONE) 3 MG TABS Take 1 tablet (3 mg total) by mouth at bedtime as needed (for sleep. ). Take immediately before bedtime    folic acid (FOLVITE) 1 MG tablet Take 1 tablet (1 mg total) by mouth daily.    furosemide (LASIX) 80 MG tablet Take 1 tablet (80 mg total) by mouth 2 (two) times daily.    isosorbide dinitrate (ISORDIL) 20 MG tablet Take 1 tablet (20 mg total) by mouth 3 (three) times daily. To take 2 tablets three times a day. Qty: 90 tablet, Refills: 0    Levothyroxine Sodium 150 MCG CAPS Take 1 capsule (150 mcg total) by mouth daily. Qty: 30 capsule, Refills: 0    tiotropium (SPIRIVA) 18 MCG inhalation capsule Place 1 capsule (18 mcg total) into inhaler and inhale daily. Qty: 30 capsule, Refills: 0      CONTINUE these medications which have NOT CHANGED   Details  ALPRAZolam (XANAX) 0.25 MG tablet Take 0.25 mg by mouth 2 (two) times daily as needed. For anxiety    aspirin 81 MG tablet Take 81 mg by mouth daily.      atorvastatin (LIPITOR) 80 MG tablet Take 80 mg by mouth at bedtime.      b complex-vitamin c-folic acid (NEPHRO-VITE) 0.8 MG TABS Take 0.8 mg by mouth at bedtime.  benzonatate (TESSALON) 200 MG capsule Take 200 mg by mouth 3 (three) times daily as needed. For cough    calcium acetate (PHOSLO) 667 MG capsule Take 667 mg by mouth 3 (three) times daily with meals.      carvedilol (COREG) 6.25 MG tablet Take 6.25 mg by mouth 2 (two) times daily with a meal.    cinacalcet (SENSIPAR) 60 MG tablet Take 60 mg by mouth daily.    docusate sodium (COLACE) 100 MG capsule Take 100 mg by mouth 2 (two) times daily.     fluticasone (FLONASE) 50 MCG/ACT nasal spray Place 2 sprays into the nose daily as needed. For allergies      olopatadine (PATANOL) 0.1 % ophthalmic solution Apply 1 drop to eye 2 (two) times daily as needed.    oxyCODONE-acetaminophen (PERCOCET) 7.5-500 MG per tablet Take 1 tablet by mouth every 8 (eight) hours as needed. For pain    clopidogrel (PLAVIX) 75 MG tablet Take 75 mg by mouth daily.      nitroGLYCERIN (NITROSTAT) 0.4 MG SL tablet Place 0.4 mg under the tongue every 5 (five) minutes as needed.       STOP taking these medications     azithromycin (ZITHROMAX) 250 MG tablet Comments:  Reason for Stopping:       levothyroxine (SYNTHROID, LEVOTHROID) 100 MCG tablet Comments:  Reason for Stopping:          Hospital Course: 1. Acute exacerbation of COPD: Patient was started on intravenous Avelox, Solu-Medrol, bronchodilator nebulizers and oxygen therapy. With these measures she made gradual improvement. She was changed to oral steroids and antibiotics in the last one to 2 days. She has been denying any dyspnea for the last 2 days but does complain more of nasal stuffiness. This may have been secondary to oxygen. She has been off oxygen overnight and today and saturating greater than 90%. She indicates her nasal stuffiness is better. She does better with nebulizers time with inhalers. She will be discharged home on steroid taper, Spiriva and when necessary home nebulizers. 2. End-stage renal disease: Nephrology was consulted and continued to provide dialysis. 3. Chronic possibly diastolic congestive heart failure: The last echo in 2008 in our system had left frontal ejection fraction of 50-55%. She sees her primary cardiologist and has poor. Unclear as to the role of Lasix while she is on dialysis. Her Lasix was reduced to twice a day. She is clinically compensated. She is advised to followup with her cardiologist. 4. Status post pacemaker: She indicates that this was placed in December of last year. On telemetry she has had 1-2 episodes of nonsustained supraventricular tachycardia versus  ventricular tachycardia which were asymptomatic. Followup with her cardiologist has been recommended. 5. Hypertension: Controlled. Continue current medications. 6. Hypothyroidism: She had both 100 mcg and 150 mcg Synthroid listed on her home medications. She was continued on 150 mcg by mouth daily in the hospital. Strongly recommend reviewing her medications when she sees her primary care physician as to the appropriate dosing.  7. Coronary artery disease: Continue home medications. No chest pains 8. Anemia and thrombocytopenia: Stable    Day of Discharge BP 145/76  Pulse 75  Temp(Src) 98.2 F (36.8 C) (Oral)  Resp 18  Ht 5\' 3"  (1.6 m)  Wt 53.4 kg (117 lb 11.6 oz)  BMI 20.85 kg/m2  SpO2 94%  General Exam: Comfortable.  Respiratory System: Bilateral fair breath sounds. No rhonchi, wheezes or crackles. No increased work of breathing.  Cardiovascular System: First  and second heart sounds heard. Regular rate and rhythm. No JVD/murmurs.  Gastrointestinal System: Abdomen is non distended, soft and normal bowel sounds heard.  Central Nervous System: Alert and oriented. No focal neurological deficits.  Results for orders placed during the hospital encounter of 06/20/11 (from the past 48 hour(s))  BASIC METABOLIC PANEL     Status: Abnormal   Collection Time   06/25/11  6:40 AM      Component Value Range Comment   Sodium 139  135 - 145 (mEq/L)    Potassium 4.4  3.5 - 5.1 (mEq/L)    Chloride 100  96 - 112 (mEq/L)    CO2 28  19 - 32 (mEq/L)    Glucose, Bld 106 (*) 70 - 99 (mg/dL)    BUN 46 (*) 6 - 23 (mg/dL) DELTA CHECK NOTED   Creatinine, Ser 4.69 (*) 0.50 - 1.10 (mg/dL)    Calcium 9.3  8.4 - 10.5 (mg/dL)    GFR calc non Af Amer 8 (*) >90 (mL/min)    GFR calc Af Amer 10 (*) >90 (mL/min)   CBC     Status: Abnormal   Collection Time   06/26/11  9:43 AM      Component Value Range Comment   WBC 7.2  4.0 - 10.5 (K/uL)    RBC 3.07 (*) 3.87 - 5.11 (MIL/uL)    Hemoglobin 10.7 (*) 12.0 - 15.0  (g/dL)    HCT 16.1 (*) 09.6 - 46.0 (%)    MCV 106.2 (*) 78.0 - 100.0 (fL)    MCH 34.9 (*) 26.0 - 34.0 (pg)    MCHC 32.8  30.0 - 36.0 (g/dL)    RDW 04.5 (*) 40.9 - 15.5 (%)    Platelets 128 (*) 150 - 400 (K/uL)   RENAL FUNCTION PANEL     Status: Abnormal   Collection Time   06/26/11  9:43 AM      Component Value Range Comment   Sodium 136  135 - 145 (mEq/L)    Potassium 5.0  3.5 - 5.1 (mEq/L)    Chloride 97  96 - 112 (mEq/L)    CO2 24  19 - 32 (mEq/L)    Glucose, Bld 112 (*) 70 - 99 (mg/dL)    BUN 66 (*) 6 - 23 (mg/dL)    Creatinine, Ser 8.11 (*) 0.50 - 1.10 (mg/dL)    Calcium 9.7  8.4 - 10.5 (mg/dL)    Phosphorus 4.9 (*) 2.3 - 4.6 (mg/dL)    Albumin 3.1 (*) 3.5 - 5.2 (g/dL)    GFR calc non Af Amer 6 (*) >90 (mL/min)    GFR calc Af Amer 7 (*) >90 (mL/min)    Lab data:  1. Cardiac enzymes were cycled and negative. 2. Pro BNP on admission was 22,212. 3. CBG is range between 106-170 mg/dL 4. TSH 0.228   Portable Chest 1 View  06/20/2011  *RADIOLOGY REPORT*  Clinical Data: Shortness of breath, cough and congestion.  PORTABLE CHEST - 1 VIEW  Comparison: 10/23/2010  Findings: Stable cardiomegaly and pacemaker.  Stable chronic lung disease.  No evidence of overt edema, focal consolidation or pleural fluid.  IMPRESSION: Stable cardiomegaly and chronic lung disease.  Original Report Authenticated By: Reola Calkins, M.D.     Disposition: Discharged home in stable condition  Diet: Heart healthy  Activity: Increase activity gradually  Follow-up Appts:   TESTS THAT NEED FOLLOW-UP Periodic followup of CBC and BMP across dialysis  Time spent on discharge, talking to the  patient, and coordinating care: 60 mins.   SignedMarcellus Scott, MD 06/26/2011, 5:08 PM

## 2012-03-23 ENCOUNTER — Other Ambulatory Visit: Payer: Self-pay

## 2012-03-23 DIAGNOSIS — T82598A Other mechanical complication of other cardiac and vascular devices and implants, initial encounter: Secondary | ICD-10-CM

## 2012-03-26 ENCOUNTER — Ambulatory Visit: Payer: Medicare Other | Admitting: Vascular Surgery

## 2012-06-29 ENCOUNTER — Inpatient Hospital Stay (HOSPITAL_COMMUNITY)
Admission: AD | Admit: 2012-06-29 | Discharge: 2012-07-06 | DRG: 682 | Disposition: A | Discharge status: Home Health | Payer: Medicare Other | Source: Other Acute Inpatient Hospital | Attending: Internal Medicine | Admitting: Internal Medicine

## 2012-06-29 ENCOUNTER — Encounter (HOSPITAL_COMMUNITY): Payer: Self-pay | Admitting: Internal Medicine

## 2012-06-29 DIAGNOSIS — J811 Chronic pulmonary edema: Secondary | ICD-10-CM | POA: Diagnosis present

## 2012-06-29 DIAGNOSIS — R627 Adult failure to thrive: Secondary | ICD-10-CM | POA: Diagnosis present

## 2012-06-29 DIAGNOSIS — I252 Old myocardial infarction: Secondary | ICD-10-CM

## 2012-06-29 DIAGNOSIS — I6529 Occlusion and stenosis of unspecified carotid artery: Secondary | ICD-10-CM | POA: Diagnosis present

## 2012-06-29 DIAGNOSIS — D631 Anemia in chronic kidney disease: Secondary | ICD-10-CM | POA: Diagnosis present

## 2012-06-29 DIAGNOSIS — F3289 Other specified depressive episodes: Secondary | ICD-10-CM | POA: Diagnosis present

## 2012-06-29 DIAGNOSIS — J449 Chronic obstructive pulmonary disease, unspecified: Secondary | ICD-10-CM

## 2012-06-29 DIAGNOSIS — I714 Abdominal aortic aneurysm, without rupture, unspecified: Secondary | ICD-10-CM | POA: Diagnosis present

## 2012-06-29 DIAGNOSIS — T82898A Other specified complication of vascular prosthetic devices, implants and grafts, initial encounter: Secondary | ICD-10-CM | POA: Diagnosis not present

## 2012-06-29 DIAGNOSIS — I120 Hypertensive chronic kidney disease with stage 5 chronic kidney disease or end stage renal disease: Principal | ICD-10-CM | POA: Diagnosis present

## 2012-06-29 DIAGNOSIS — I509 Heart failure, unspecified: Secondary | ICD-10-CM

## 2012-06-29 DIAGNOSIS — Z950 Presence of cardiac pacemaker: Secondary | ICD-10-CM

## 2012-06-29 DIAGNOSIS — Z9119 Patient's noncompliance with other medical treatment and regimen: Secondary | ICD-10-CM

## 2012-06-29 DIAGNOSIS — Z951 Presence of aortocoronary bypass graft: Secondary | ICD-10-CM

## 2012-06-29 DIAGNOSIS — Z23 Encounter for immunization: Secondary | ICD-10-CM

## 2012-06-29 DIAGNOSIS — Y832 Surgical operation with anastomosis, bypass or graft as the cause of abnormal reaction of the patient, or of later complication, without mention of misadventure at the time of the procedure: Secondary | ICD-10-CM | POA: Diagnosis present

## 2012-06-29 DIAGNOSIS — Z7982 Long term (current) use of aspirin: Secondary | ICD-10-CM

## 2012-06-29 DIAGNOSIS — G4700 Insomnia, unspecified: Secondary | ICD-10-CM | POA: Diagnosis present

## 2012-06-29 DIAGNOSIS — N2581 Secondary hyperparathyroidism of renal origin: Secondary | ICD-10-CM | POA: Diagnosis present

## 2012-06-29 DIAGNOSIS — E785 Hyperlipidemia, unspecified: Secondary | ICD-10-CM | POA: Diagnosis present

## 2012-06-29 DIAGNOSIS — I2510 Atherosclerotic heart disease of native coronary artery without angina pectoris: Secondary | ICD-10-CM | POA: Diagnosis present

## 2012-06-29 DIAGNOSIS — R0789 Other chest pain: Secondary | ICD-10-CM | POA: Diagnosis present

## 2012-06-29 DIAGNOSIS — J441 Chronic obstructive pulmonary disease with (acute) exacerbation: Secondary | ICD-10-CM | POA: Diagnosis present

## 2012-06-29 DIAGNOSIS — J189 Pneumonia, unspecified organism: Secondary | ICD-10-CM | POA: Diagnosis present

## 2012-06-29 DIAGNOSIS — F411 Generalized anxiety disorder: Secondary | ICD-10-CM | POA: Diagnosis present

## 2012-06-29 DIAGNOSIS — R0602 Shortness of breath: Secondary | ICD-10-CM | POA: Diagnosis present

## 2012-06-29 DIAGNOSIS — Z9861 Coronary angioplasty status: Secondary | ICD-10-CM

## 2012-06-29 DIAGNOSIS — E039 Hypothyroidism, unspecified: Secondary | ICD-10-CM | POA: Diagnosis present

## 2012-06-29 DIAGNOSIS — Z79899 Other long term (current) drug therapy: Secondary | ICD-10-CM

## 2012-06-29 DIAGNOSIS — F329 Major depressive disorder, single episode, unspecified: Secondary | ICD-10-CM | POA: Diagnosis present

## 2012-06-29 DIAGNOSIS — Z87891 Personal history of nicotine dependence: Secondary | ICD-10-CM

## 2012-06-29 DIAGNOSIS — N186 End stage renal disease: Secondary | ICD-10-CM | POA: Diagnosis present

## 2012-06-29 DIAGNOSIS — I658 Occlusion and stenosis of other precerebral arteries: Secondary | ICD-10-CM | POA: Diagnosis present

## 2012-06-29 DIAGNOSIS — Z992 Dependence on renal dialysis: Secondary | ICD-10-CM | POA: Diagnosis present

## 2012-06-29 DIAGNOSIS — IMO0002 Reserved for concepts with insufficient information to code with codable children: Secondary | ICD-10-CM

## 2012-06-29 DIAGNOSIS — I10 Essential (primary) hypertension: Secondary | ICD-10-CM

## 2012-06-29 DIAGNOSIS — Z8673 Personal history of transient ischemic attack (TIA), and cerebral infarction without residual deficits: Secondary | ICD-10-CM

## 2012-06-29 DIAGNOSIS — Z91199 Patient's noncompliance with other medical treatment and regimen due to unspecified reason: Secondary | ICD-10-CM

## 2012-06-29 HISTORY — DX: Chronic obstructive pulmonary disease, unspecified: J44.9

## 2012-06-29 HISTORY — DX: Dependence on renal dialysis: Z99.2

## 2012-06-29 HISTORY — DX: Presence of aortocoronary bypass graft: Z95.1

## 2012-06-29 HISTORY — DX: End stage renal disease: N18.6

## 2012-06-29 LAB — CBC
MCH: 32.8 pg (ref 26.0–34.0)
MCHC: 32.9 g/dL (ref 30.0–36.0)
MCV: 99.7 fL (ref 78.0–100.0)
Platelets: 129 10*3/uL — ABNORMAL LOW (ref 150–400)

## 2012-06-29 LAB — CREATININE, SERUM: Creatinine, Ser: 10.33 mg/dL — ABNORMAL HIGH (ref 0.50–1.10)

## 2012-06-29 MED ORDER — OXYCODONE-ACETAMINOPHEN 7.5-500 MG PO TABS: 1.0000 | ORAL_TABLET | Freq: Three times a day (TID) | ORAL | Status: DC | PRN

## 2012-06-29 MED ORDER — CARVEDILOL 6.25 MG PO TABS
6.2500 mg | ORAL_TABLET | Freq: Two times a day (BID) | ORAL | Status: DC
  Administered 2012-06-30 – 2012-07-05 (×11): 6.25 mg via ORAL
  Filled 2012-06-29 (×15): qty 1

## 2012-06-29 MED ORDER — OXYCODONE HCL 5 MG PO TABS
2.5000 mg | ORAL_TABLET | Freq: Three times a day (TID) | ORAL | Status: DC | PRN
  Administered 2012-06-29 – 2012-07-06 (×6): 2.5 mg via ORAL
  Filled 2012-06-29 (×6): qty 1

## 2012-06-29 MED ORDER — LEVOFLOXACIN IN D5W 500 MG/100ML IV SOLN
500.0000 mg | INTRAVENOUS | Status: DC
  Administered 2012-07-01: 500 mg via INTRAVENOUS
  Filled 2012-06-29 (×2): qty 100

## 2012-06-29 MED ORDER — ACETAMINOPHEN 325 MG PO TABS: 650.0000 mg | ORAL_TABLET | Freq: Four times a day (QID) | ORAL | Status: DC | PRN

## 2012-06-29 MED ORDER — ASPIRIN 81 MG PO TABS: 81.0000 mg | ORAL_TABLET | Freq: Every day | ORAL | Status: DC

## 2012-06-29 MED ORDER — DOCUSATE SODIUM 100 MG PO CAPS
100.0000 mg | ORAL_CAPSULE | Freq: Two times a day (BID) | ORAL | Status: DC
  Administered 2012-06-30 – 2012-07-04 (×10): 100 mg via ORAL
  Filled 2012-06-29 (×16): qty 1

## 2012-06-29 MED ORDER — CINACALCET HCL 30 MG PO TABS
60.0000 mg | ORAL_TABLET | Freq: Every day | ORAL | Status: DC
  Administered 2012-06-30 – 2012-07-06 (×7): 60 mg via ORAL
  Filled 2012-06-29 (×8): qty 2

## 2012-06-29 MED ORDER — CALCIUM ACETATE 667 MG PO CAPS
667.0000 mg | ORAL_CAPSULE | Freq: Three times a day (TID) | ORAL | Status: DC
  Administered 2012-06-30 – 2012-07-06 (×15): 667 mg via ORAL
  Filled 2012-06-29 (×22): qty 1

## 2012-06-29 MED ORDER — ISOSORBIDE DINITRATE 20 MG PO TABS
20.0000 mg | ORAL_TABLET | Freq: Three times a day (TID) | ORAL | Status: DC
  Administered 2012-06-29 – 2012-07-06 (×20): 20 mg via ORAL
  Filled 2012-06-29 (×23): qty 1

## 2012-06-29 MED ORDER — SODIUM CHLORIDE 0.9 % IJ SOLN
3.0000 mL | Freq: Two times a day (BID) | INTRAMUSCULAR | Status: DC
  Administered 2012-06-29 – 2012-07-02 (×6): 3 mL via INTRAVENOUS

## 2012-06-29 MED ORDER — LEVOFLOXACIN IN D5W 750 MG/150ML IV SOLN
750.0000 mg | Freq: Once | INTRAVENOUS | Status: AC
  Administered 2012-06-29: 750 mg via INTRAVENOUS
  Filled 2012-06-29: qty 150

## 2012-06-29 MED ORDER — ACETAMINOPHEN 650 MG RE SUPP: 650.0000 mg | Freq: Four times a day (QID) | RECTAL | Status: DC | PRN

## 2012-06-29 MED ORDER — DEXTROSE 5 % IV SOLN
1.0000 g | INTRAVENOUS | Status: DC
  Administered 2012-06-29 – 2012-07-01 (×2): 1 g via INTRAVENOUS
  Filled 2012-06-29 (×3): qty 1

## 2012-06-29 MED ORDER — MAGIC MOUTHWASH
15.0000 mL | Freq: Four times a day (QID) | ORAL | Status: DC | PRN
  Administered 2012-06-29: 15 mL via ORAL
  Filled 2012-06-29: qty 15

## 2012-06-29 MED ORDER — ALBUTEROL SULFATE (5 MG/ML) 0.5% IN NEBU
2.5000 mg | INHALATION_SOLUTION | RESPIRATORY_TRACT | Status: DC | PRN
  Filled 2012-06-29: qty 0.5

## 2012-06-29 MED ORDER — HEPARIN SODIUM (PORCINE) 5000 UNIT/ML IJ SOLN
5000.0000 [IU] | Freq: Three times a day (TID) | INTRAMUSCULAR | Status: DC
  Administered 2012-06-29 – 2012-07-01 (×6): 5000 [IU] via SUBCUTANEOUS
  Filled 2012-06-29 (×11): qty 1

## 2012-06-29 MED ORDER — ONDANSETRON HCL 4 MG/2ML IJ SOLN: 4.0000 mg | Freq: Four times a day (QID) | INTRAMUSCULAR | Status: DC | PRN

## 2012-06-29 MED ORDER — VANCOMYCIN HCL 10 G IV SOLR
1250.0000 mg | Freq: Once | INTRAVENOUS | Status: AC
  Administered 2012-06-30: 1250 mg via INTRAVENOUS
  Filled 2012-06-29: qty 1250

## 2012-06-29 MED ORDER — CLOPIDOGREL BISULFATE 75 MG PO TABS
75.0000 mg | ORAL_TABLET | Freq: Every day | ORAL | Status: DC
  Administered 2012-06-30 – 2012-07-05 (×6): 75 mg via ORAL
  Filled 2012-06-29 (×8): qty 1

## 2012-06-29 MED ORDER — FLUTICASONE PROPIONATE 50 MCG/ACT NA SUSP
2.0000 | Freq: Every day | NASAL | Status: DC | PRN
Start: 2012-06-29 — End: 2012-07-06
  Filled 2012-06-29: qty 16

## 2012-06-29 MED ORDER — ONDANSETRON HCL 4 MG PO TABS: 4.0000 mg | ORAL_TABLET | Freq: Four times a day (QID) | ORAL | Status: DC | PRN

## 2012-06-29 MED ORDER — TIOTROPIUM BROMIDE MONOHYDRATE 18 MCG IN CAPS
18.0000 ug | ORAL_CAPSULE | Freq: Every day | RESPIRATORY_TRACT | Status: DC
  Administered 2012-06-30 – 2012-07-01 (×2): 18 ug via RESPIRATORY_TRACT
  Filled 2012-06-29: qty 5

## 2012-06-29 MED ORDER — OXYCODONE-ACETAMINOPHEN 5-325 MG PO TABS
1.0000 | ORAL_TABLET | Freq: Three times a day (TID) | ORAL | Status: DC | PRN
  Administered 2012-06-29 – 2012-07-03 (×4): 1 via ORAL
  Filled 2012-06-29 (×4): qty 1

## 2012-06-29 MED ORDER — LEVOTHYROXINE SODIUM 150 MCG PO CAPS: 1.0000 | ORAL_CAPSULE | Freq: Every day | ORAL | Status: DC

## 2012-06-29 MED ORDER — PNEUMOCOCCAL VAC POLYVALENT 25 MCG/0.5ML IJ INJ
0.5000 mL | INJECTION | INTRAMUSCULAR | Status: AC
  Administered 2012-06-30: 0.5 mL via INTRAMUSCULAR
  Filled 2012-06-29: qty 0.5

## 2012-06-29 MED ORDER — ATORVASTATIN CALCIUM 80 MG PO TABS
80.0000 mg | ORAL_TABLET | Freq: Every day | ORAL | Status: DC
  Administered 2012-06-29 – 2012-07-05 (×7): 80 mg via ORAL
  Filled 2012-06-29 (×9): qty 1

## 2012-06-29 MED ORDER — LEVOTHYROXINE SODIUM 150 MCG PO TABS
150.0000 ug | ORAL_TABLET | Freq: Every day | ORAL | Status: DC
  Administered 2012-06-30 – 2012-07-02 (×3): 150 ug via ORAL
  Filled 2012-06-29 (×4): qty 1

## 2012-06-29 MED ORDER — FUROSEMIDE 80 MG PO TABS
80.0000 mg | ORAL_TABLET | Freq: Two times a day (BID) | ORAL | Status: DC
  Administered 2012-06-29 – 2012-06-30 (×2): 80 mg via ORAL
  Filled 2012-06-29 (×4): qty 1

## 2012-06-29 MED ORDER — NITROGLYCERIN 0.4 MG SL SUBL
0.4000 mg | SUBLINGUAL_TABLET | SUBLINGUAL | Status: DC | PRN
  Administered 2012-07-03 – 2012-07-06 (×5): 0.4 mg via SUBLINGUAL
  Filled 2012-06-29 (×2): qty 25

## 2012-06-29 MED ORDER — BIOTENE DRY MOUTH MT LIQD
15.0000 mL | Freq: Two times a day (BID) | OROMUCOSAL | Status: DC
  Administered 2012-06-30 – 2012-07-05 (×11): 15 mL via OROMUCOSAL

## 2012-06-29 MED ORDER — ZOLPIDEM TARTRATE 5 MG PO TABS
5.0000 mg | ORAL_TABLET | Freq: Every evening | ORAL | Status: DC | PRN
  Administered 2012-07-02: 5 mg via ORAL
  Filled 2012-06-29: qty 1

## 2012-06-29 MED ORDER — OLOPATADINE HCL 0.1 % OP SOLN
1.0000 [drp] | Freq: Two times a day (BID) | OPHTHALMIC | Status: DC | PRN
  Filled 2012-06-29: qty 5

## 2012-06-29 MED ORDER — ALPRAZOLAM 0.25 MG PO TABS
0.2500 mg | ORAL_TABLET | Freq: Two times a day (BID) | ORAL | Status: DC | PRN
  Administered 2012-06-29 – 2012-07-06 (×11): 0.25 mg via ORAL
  Filled 2012-06-29 (×11): qty 1

## 2012-06-29 MED ORDER — BENZONATATE 100 MG PO CAPS
200.0000 mg | ORAL_CAPSULE | Freq: Three times a day (TID) | ORAL | Status: DC | PRN
Start: 2012-06-29 — End: 2012-07-06
  Filled 2012-06-29 (×2): qty 2

## 2012-06-29 NOTE — H&P (Signed)
[name] is an 79 y.o. female. Patient was seen and examined on June 29, 2012.   Chief Complaint: Shortness of breath. HPI: 79 year-old female with history of ESRD on hemodialysis gets dialyzed on Tuesday Thursday and Saturday had missed her dialysis on Saturday due to weakness. Patient was recently started on antibiotics by her PCP for possible pneumonia. Patient has been on antibiotics for last few days. Since she became more short of breath last 2 days she had gone to [facility] and over there patient was found to be in fluid overload but not in respiratory distress. Chest x-ray showed pulmonary congestion compatible with fluid overload. Patient is transferred to [facility] for further management, as patient may require dialysis. Patient denies any chest pain fever chills. Does have productive cough.  Labs done at [facility] show WBC of 8.8 hemoglobin of 9.9 platelets of 140 sodium 136 potassium 5 bicarbonate was 26 troponin was negative.  Past Medical History  Diagnosis Date  . Carotid artery occlusion     Bilateral carotid artery disease  . COPD (chronic obstructive pulmonary disease)   . Peripheral artery disease   . Arrhythmia     ? hx of atrial fibrillation  . Lightheadedness   . CHF (congestive heart failure)     EF 25-30%.   . Coronary artery disease 2008    post MI  . Hypertension   . Hyperlipidemia   . Chronic kidney disease 02/2007    End-stage on hemodialysis  . Dialysis patient 06/20/11    [name], F; [address], [place]  . AAA (abdominal aortic aneurysm)   . Stroke ` 2007  . Duodenal ulcer 2008  . Myocardial infarction 02/2007    "had 3 MI before my CABG"  . Heart murmur   . Anxiety   . Depression   . Pneumonia   . History of bronchitis   . SOB (shortness of breath)   . Shortness of breath on exertion   . On home oxygen therapy     prn  . Hypothyroidism   . Anemia   . Blood transfusion     Past Surgical History  Procedure Date  . Thyroid  surgery 2004  . Dg av dialysis  shunt access exist*l* or 03/2000    left upper arm  . Vaginal hysterectomy 1976  . Hemorrhoid surgery 1971  . Insert / replace / remove pacemaker 1990's    initial placement  . Insert / replace / remove pacemaker 05/2011    "changed out"  . Incisional hernia repair 10/2010    incarcerated  . Common iliac 06/2009    bilateral kissing stents  . Pseudoaneurysm repair 09/1999    right groin  . Av fistula repair 04/2007    left upper arm  . Cardiac catheterization 02/10, 03/12    Most recent showed 3 vessel CAD with patent grafts: SVG to LAD, SVG to PDA and SVG to OM  . Coronary angioplasty with stent placement 09/1999    "1"  . Coronary artery bypass graft 02/2007    CABG X3    History reviewed. No pertinent family history. Social History:  reports that she quit smoking about 6 years ago. Her smoking use included Cigarettes. She has a 20 pack-year smoking history. She has never used smokeless tobacco. She reports that she does not drink alcohol or use illicit drugs.  Allergies:  Allergies  Allergen Reactions  . Lisinopril Swelling  . Iohexol      Desc:  itching, swelling     Medications Prior to Admission  Medication Sig Dispense Refill  . albuterol (PROVENTIL) (2.5 MG/3ML) 0.083% nebulizer solution Take 3 mLs (2.5 mg total) by nebulization every 6 (six) hours as needed for wheezing.  75 mL  0  . ALPRAZolam (XANAX) 0.25 MG tablet Take 0.25 mg by mouth 2 (two) times daily as needed. For anxiety      . aspirin 81 MG tablet Take 81 mg by mouth daily.        Marland Kitchen atorvastatin (LIPITOR) 80 MG tablet Take 80 mg by mouth at bedtime.        Marland Kitchen b complex-vitamin c-folic acid (NEPHRO-VITE) 0.8 MG TABS Take 0.8 mg by mouth at bedtime.        . benzonatate (TESSALON) 200 MG capsule Take 200 mg by mouth 3 (three) times daily as needed. For cough      . calcium acetate (PHOSLO) 667 MG capsule Take 667 mg by mouth 3 (three) times daily with meals.        . carvedilol  (COREG) 6.25 MG tablet Take 6.25 mg by mouth 2 (two) times daily with a meal.      . cinacalcet (SENSIPAR) 60 MG tablet Take 60 mg by mouth daily.      . clopidogrel (PLAVIX) 75 MG tablet Take 75 mg by mouth daily.        Marland Kitchen docusate sodium (COLACE) 100 MG capsule Take 100 mg by mouth 2 (two) times daily.       . Eszopiclone (ESZOPICLONE) 3 MG TABS Take 1 tablet (3 mg total) by mouth at bedtime as needed (for sleep. ). Take immediately before bedtime      . fluticasone (FLONASE) 50 MCG/ACT nasal spray Place 2 sprays into the nose daily as needed. For allergies      . furosemide (LASIX) 80 MG tablet Take 1 tablet (80 mg total) by mouth 2 (two) times daily.      . isosorbide dinitrate (ISORDIL) 20 MG tablet Take 1 tablet (20 mg total) by mouth 3 (three) times daily. To take 2 tablets three times a day.  90 tablet  0  . Levothyroxine Sodium 150 MCG CAPS Take 1 capsule (150 mcg total) by mouth daily.  30 capsule  0  . nitroGLYCERIN (NITROSTAT) 0.4 MG SL tablet Place 0.4 mg under the tongue every 5 (five) minutes as needed.       Marland Kitchen olopatadine (PATANOL) 0.1 % ophthalmic solution Apply 1 drop to eye 2 (two) times daily as needed.      Marland Kitchen oxyCODONE-acetaminophen (PERCOCET) 7.5-500 MG per tablet Take 1 tablet by mouth every 8 (eight) hours as needed. For pain      . tiotropium (SPIRIVA) 18 MCG inhalation capsule Place 1 capsule (18 mcg total) into inhaler and inhale daily.  30 capsule  0    No results found for this or any previous visit (from the past 48 hour(s)). No results found.  Review of Systems  Constitutional: Negative.   HENT: Negative.   Eyes: Negative.   Respiratory: Positive for cough, sputum production and shortness of breath.   Cardiovascular: Negative.   Gastrointestinal: Negative.   Genitourinary: Negative.   Musculoskeletal: Negative.   Skin: Negative.   Neurological: Negative.   Endo/Heme/Allergies: Negative.   Psychiatric/Behavioral: Negative.     Blood pressure 148/35,  pulse 77, temperature 98.5 F (36.9 C), temperature source Oral. Physical Exam  Constitutional: She is oriented to person, place, and time. She appears well-developed  and well-nourished. No distress.  HENT:  Head: Normocephalic and atraumatic.  Right Ear: External ear normal.  Left Ear: External ear normal.  Nose: Nose normal.  Mouth/Throat: Oropharynx is clear and moist. No oropharyngeal exudate.  Eyes: Conjunctivae normal are normal. Pupils are equal, round, and reactive to light. Right eye exhibits no discharge. Left eye exhibits no discharge. No scleral icterus.  Neck: Normal range of motion. Neck supple.  Cardiovascular: Normal rate and regular rhythm.   Respiratory: Effort normal. No respiratory distress. She has no wheezes. She has rales.  GI: Soft. Bowel sounds are normal. She exhibits no distension. There is no tenderness. There is no rebound.  Musculoskeletal: She exhibits no edema and no tenderness.  Neurological: She is alert and oriented to person, place, and time.       Moves all extremities.  Skin: Skin is warm and dry. She is not diaphoretic.  Psychiatric: Her behavior is normal.     Assessment/Plan #1. Shortness of breath most likely from mild fluid overload in a dialysis patient who missed dialysis - at this time patient is not in respiratory distress and will consult nephrology in a.m. for dialysis. #2. Recently treated for pneumonia - we'll continue antibiotics. Antibiotics to cover for health care associated pneumonia. #3. CAD - denies any chest pain at this time. EKG was showing paced rhythm and troponins was negative. #4. COPD - presently not wheezing. Continue inhalers.  #5. Anemia - probably from chronic kidney disease. Closely follow CBC.  CODE STATUS - full code.  Eduard Clos 06/29/2012, 8:48 PM

## 2012-06-29 NOTE — Progress Notes (Signed)
ANTIBIOTIC CONSULT NOTE - INITIAL  Pharmacy Consult for vanc/cefepime/levaquin Indication: pneumonia  Allergies  Allergen Reactions  . Lisinopril Swelling  . Iohexol      Desc: itching, swelling     Patient Measurements: Height: 5\' 3"  (160 cm) Weight: 113 lb 1.6 oz (51.302 kg) IBW/kg (Calculated) : 52.4    Vital Signs: Temp: 97.7 F (36.5 C) (01/27 2051) Temp src: Oral (01/27 2051) BP: 120/42 mmHg (01/27 2051) Pulse Rate: 80  (01/27 2051) Intake/Output from previous day:   Intake/Output from this shift: Total I/O In: -  Out: 150 [Urine:150]  Labs: No results found for this basename: WBC:3,HGB:3,PLT:3,LABCREA:3,CREATININE:3 in the last 72 hours Estimated Creatinine Clearance: 6.5 ml/min (by C-G formula based on Cr of 5.85). No results found for this basename: VANCOTROUGH:2,VANCOPEAK:2,VANCORANDOM:2,GENTTROUGH:2,GENTPEAK:2,GENTRANDOM:2,TOBRATROUGH:2,TOBRAPEAK:2,TOBRARND:2,AMIKACINPEAK:2,AMIKACINTROU:2,AMIKACIN:2, in the last 72 hours   Microbiology: No results found for this or any previous visit (from the past 720 hour(s)).  Medical History: Past Medical History  Diagnosis Date  . Carotid artery occlusion     Bilateral carotid artery disease  . COPD (chronic obstructive pulmonary disease)   . Peripheral artery disease   . Arrhythmia     ? hx of atrial fibrillation  . Lightheadedness   . CHF (congestive heart failure)     EF 25-30%.   . Coronary artery disease 2008    post MI  . Hypertension   . Hyperlipidemia   . Chronic kidney disease 02/2007    End-stage on hemodialysis  . Dialysis patient 06/20/11    [name], F; [address], [place]  . AAA (abdominal aortic aneurysm)   . Stroke ` 2007  . Duodenal ulcer 2008  . Myocardial infarction 02/2007    "had 3 MI before my CABG"  . Heart murmur   . Anxiety   . Depression   . Pneumonia   . History of bronchitis   . SOB (shortness of breath)   . Shortness of breath on exertion   . On home oxygen therapy     prn    . Hypothyroidism   . Anemia   . Blood transfusion     Medications:  Scheduled:    . aspirin  81 mg Oral Daily  . atorvastatin  80 mg Oral QHS  . calcium acetate  667 mg Oral TID WC  . carvedilol  6.25 mg Oral BID WC  . cinacalcet  60 mg Oral Daily  . clopidogrel  75 mg Oral Daily  . docusate sodium  100 mg Oral BID  . furosemide  80 mg Oral BID  . heparin  5,000 Units Subcutaneous Q8H  . isosorbide dinitrate  20 mg Oral TID  . Levothyroxine Sodium  1 capsule Oral Daily  . sodium chloride  3 mL Intravenous Q12H  . tiotropium  18 mcg Inhalation Daily   Assessment: 79 yo with ESRD who was admitted tonight to r/o PNA. Empiric abx with vanc/cefepime/levaquin.   Goal of Therapy:  Vanc preHD = 15-25  Plan:  Vanc 1.25 g IV x1 Cefepime 1g IV q24 Levaquin 750mg  IV x1 then 500mg  q48  Ulyses Southward Oceana 06/29/2012,8:59 PM

## 2012-06-29 NOTE — Plan of Care (Signed)
Name: LADAISHA PORTILLO MRN: [id number] PCP: Galvin Proffer, MD   Brief HPI: 79 year old female with history of end-stage renal disease on hemodialysis, hypertension, hyperlipidemia, COPD (on chronic 2 L), coronary artery disease who presented to [facility] with complaints of shortness of breath.  Patient was recently diagnosed with pneumonia and completed course of antibiotic with levofloxacin yesterday.  She has not had her dialysis for 4 days and did not go to dialysis session today as she was feeling weak.  She presented to the emergency department with volume overload.  Labs: Sodium 136, potassium 5.0, chloride 95.  WBC 8.8, hemoglobin 9.9, hematocrit 29.8.  EKG per ER physician: Paced.  Vitals: 97.9, 77, 133/70, 16, 98% on 2 L  Imaging: Chest x-ray showed volume overload.   Bed request: [place] team 10, telemetry.  Dewitte Vannice A, MD 06/29/2012, 1:13 PM

## 2012-06-30 DIAGNOSIS — N186 End stage renal disease: Secondary | ICD-10-CM

## 2012-06-30 LAB — CBC
HCT: 28.9 % — ABNORMAL LOW (ref 36.0–46.0)
Hemoglobin: 9.3 g/dL — ABNORMAL LOW (ref 12.0–15.0)
MCH: 32.3 pg (ref 26.0–34.0)
MCHC: 32.2 g/dL (ref 30.0–36.0)
MCV: 100.3 fL — ABNORMAL HIGH (ref 78.0–100.0)
RDW: 16.4 % — ABNORMAL HIGH (ref 11.5–15.5)

## 2012-06-30 LAB — BASIC METABOLIC PANEL
BUN: 96 mg/dL — ABNORMAL HIGH (ref 6–23)
CO2: 25 mEq/L (ref 19–32)
Chloride: 96 mEq/L (ref 96–112)
Creatinine, Ser: 10.6 mg/dL — ABNORMAL HIGH (ref 0.50–1.10)
Glucose, Bld: 85 mg/dL (ref 70–99)
Potassium: 4.6 mEq/L (ref 3.5–5.1)

## 2012-06-30 MED ORDER — CEFEPIME HCL 2 G IJ SOLR
2.0000 g | Freq: Once | INTRAMUSCULAR | Status: AC
  Administered 2012-06-30: 2 g via INTRAVENOUS
  Filled 2012-06-30 (×2): qty 2

## 2012-06-30 MED ORDER — DARBEPOETIN ALFA-POLYSORBATE 100 MCG/0.5ML IJ SOLN
100.0000 ug | INTRAMUSCULAR | Status: DC
  Administered 2012-07-02: 100 ug via INTRAVENOUS
  Filled 2012-06-30: qty 0.5

## 2012-06-30 MED ORDER — VANCOMYCIN HCL 500 MG IV SOLR
500.0000 mg | Freq: Once | INTRAVENOUS | Status: AC
  Administered 2012-06-30: 500 mg via INTRAVENOUS
  Filled 2012-06-30: qty 500

## 2012-06-30 MED ORDER — RENA-VITE PO TABS
1.0000 | ORAL_TABLET | Freq: Every day | ORAL | Status: DC
  Administered 2012-06-30 – 2012-07-05 (×6): 1 via ORAL
  Filled 2012-06-30 (×7): qty 1

## 2012-06-30 MED ORDER — BOOST / RESOURCE BREEZE PO LIQD
1.0000 | Freq: Two times a day (BID) | ORAL | Status: DC
  Administered 2012-06-30 – 2012-07-06 (×7): 1 via ORAL

## 2012-06-30 NOTE — Progress Notes (Signed)
ANTIBIOTIC CONSULT NOTE - FOLLOW UP  Pharmacy Consult for Vancomycin, Maxipime, Levaquin Indication: pneumonia  Patient Measurements: Height: 5\' 3"  (160 cm) Weight: 115 lb 4.8 oz (52.3 kg) IBW/kg (Calculated) : 52.4   Vital Signs: Temp: 97.4 F (36.3 C) (01/28 1330) Temp src: Oral (01/28 1330) BP: 155/26 mmHg (01/28 1430) Pulse Rate: 76  (01/28 1430)  Labs:  [facility] 06/30/12 0610 06/29/12 2103  WBC 8.7 8.7  HGB 9.3* 9.6*  PLT 128* 129*  LABCREA -- --  CREATININE 10.60* 10.33*   Assessment:  IV antibiotics begun last night. Received Vancomycin 1250 mg IV, Maxipime 1 gram IV and Levaquin 750 mg IV. Currently in hemodialysis.  Goal of Therapy:  pre-dialysis vancomycin levels 15-25 mcg/ml  Plan:   Vancomycin 500 mg IV and Maxipime 2 grams after each HD session. One-time orders for today. Will follow-up for next HD.  Levaquin 500 mg IV q48hrs - next due 1/29 pm.  Dennie Fetters, RPh Pager: [phone] 06/30/2012,3:10 PM

## 2012-06-30 NOTE — Consult Note (Signed)
[facility] Renal Consultation Note  Indication for Consultation:  Management of ESRD/hemodialysis; anemia, hypertension/volume and secondary hyperparathyroidism  HPI: [name] is a 79 y.o. female admitted with  SOB with recently missed hd Sat and recently tx PNAPNA. She has  ho  Copd with home O2 and Nebulizer machine at home recently started on PO antibiotics per her PCP in [place]. . She missed her last HD SAT. "Too weak to attend'' She  Went to [facility] and found to be in fluid overload but not in respiratory distress. Chest x-ray showed pulmonary congestion compatible with fluid overload.She has a productive cough with mild sob and no chest pain.    At out pt. Dialysis she had to stop using her [facility] technique secondary to infection with Jan. 30th VVS apt.to evaluate. Also reports bilateral knee pain since fall at home post hemodialysis  Past Thursday with,bilateral large bloody skin abrasions covered with clear bandages. Asking for vvs eval her avf here and dressing changes for knees."not changed since [facility] RN past Thursday."       Past Medical History  Diagnosis Date  . Carotid artery occlusion     Bilateral carotid artery disease  . COPD (chronic obstructive pulmonary disease)   . Peripheral artery disease   . Arrhythmia     ? hx of atrial fibrillation  . Lightheadedness   . CHF (congestive heart failure)     EF 25-30%.   . Coronary artery disease 2008    post MI  . Hypertension   . Hyperlipidemia   . Chronic kidney disease 02/2007    End-stage on hemodialysis  . Dialysis patient 06/20/11    [name], F; [address] [place]  . AAA (abdominal aortic aneurysm)   . Stroke ` 2007  . Duodenal ulcer 2008  . Myocardial infarction 02/2007    "had 3 MI before my CABG"  . Heart murmur   . Anxiety   . Depression   . Pneumonia   . History of bronchitis   . SOB (shortness of breath)   . Shortness of breath on exertion   . On home oxygen therapy    prn  . Hypothyroidism   . Anemia   . Blood transfusion     Past Surgical History  Procedure Date  . Thyroid surgery 2004  . Dg av dialysis  shunt access exist*l* or 03/2000    left upper arm  . Vaginal hysterectomy 1976  . Hemorrhoid surgery 1971  . Insert / replace / remove pacemaker 1990's    initial placement  . Insert / replace / remove pacemaker 05/2011    "changed out"  . Incisional hernia repair 10/2010    incarcerated  . Common iliac 06/2009    bilateral kissing stents  . Pseudoaneurysm repair 09/1999    right groin  . Av fistula repair 04/2007    left upper arm  . Cardiac catheterization 02/10, 03/12    Most recent showed 3 vessel CAD with patent grafts: SVG to LAD, SVG to PDA and SVG to OM  . Coronary angioplasty with stent placement 09/1999    "1"  . Coronary artery bypass graft 02/2007    CABG X3     History reviewed. No pertinent family history.    reports that she quit smoking about 6 years ago. Her smoking use included Cigarettes. She has a 20 pack-year smoking history. She has never used smokeless tobacco. She reports that she does not drink alcohol or  use illicit drugs.   Allergies  Allergen Reactions  . Ivp Dye (Iodinated Diagnostic Agents) Swelling  . Lisinopril Swelling  . Iohexol      Desc: itching, swelling     Prior to Admission medications   Medication Sig Start Date End Date Taking? Authorizing Provider  albuterol (PROVENTIL) (5 MG/ML) 0.5% nebulizer solution Take 2.5 mg by nebulization every 6 (six) hours as needed. For shortness of breath   Yes Historical Provider, MD  ALPRAZolam (XANAX) 0.25 MG tablet Take 0.25 mg by mouth 2 (two) times daily as needed. For anxiety   Yes Historical Provider, MD  aspirin 81 MG chewable tablet Chew 81 mg by mouth daily.   Yes Historical Provider, MD  atorvastatin (LIPITOR) 80 MG tablet Take 80 mg by mouth at bedtime.    Yes Historical Provider, MD  benzonatate (TESSALON) 200 MG capsule Take 200 mg by mouth 3  (three) times daily as needed. For cough   Yes Historical Provider, MD  calcium acetate (PHOSLO) 667 MG capsule Take 1,334-3,335 mg by mouth 3 (three) times daily with meals. 5 capsules with meals, 2 capsules with snacks   Yes Historical Provider, MD  carvedilol (COREG) 3.125 MG tablet Take 3.125 mg by mouth 2 (two) times daily with a meal.   Yes Historical Provider, MD  cinacalcet (SENSIPAR) 60 MG tablet Take 60 mg by mouth daily.   Yes Historical Provider, MD  docusate sodium (COLACE) 100 MG capsule Take 100 mg by mouth 2 (two) times daily.    Yes Historical Provider, MD  fluticasone (FLONASE) 50 MCG/ACT nasal spray Place 2 sprays into the nose daily as needed. For allergies   Yes Historical Provider, MD  folic acid (FOLVITE) 1 MG tablet Take 1 mg by mouth daily.   Yes Historical Provider, MD  furosemide (LASIX) 80 MG tablet Take 80 mg by mouth 4 (four) times daily.   Yes Historical Provider, MD  isosorbide dinitrate (ISORDIL) 40 MG tablet Take 40 mg by mouth 3 (three) times daily.   Yes Historical Provider, MD  levothyroxine (SYNTHROID, LEVOTHROID) 137 MCG tablet Take 137 mcg by mouth daily.   Yes Historical Provider, MD  loratadine (CLARITIN) 10 MG tablet Take 10 mg by mouth daily.   Yes Historical Provider, MD  megestrol (MEGACE) 400 MG/10ML suspension Take 600 mg by mouth daily.   Yes Historical Provider, MD  multivitamin (RENA-VIT) TABS tablet Take 1 tablet by mouth daily.   Yes Historical Provider, MD  nitroGLYCERIN (NITROSTAT) 0.4 MG SL tablet Place 0.4 mg under the tongue every 5 (five) minutes as needed. For chest pain   Yes Historical Provider, MD  olopatadine (PATANOL) 0.1 % ophthalmic solution Place 1 drop into both eyes daily as needed. For dry eyes   Yes Historical Provider, MD  oxyCODONE (OXY IR/ROXICODONE) 5 MG immediate release tablet Take 5 mg by mouth every 4 (four) hours as needed. For pain   Yes Historical Provider, MD  oxyCODONE-acetaminophen (PERCOCET) 7.5-500 MG per tablet  Take 1 tablet by mouth every 8 (eight) hours as needed. For pain   Yes Historical Provider, MD  predniSONE (DELTASONE) 20 MG tablet Take 40 mg by mouth daily.   Yes Historical Provider, MD  promethazine (PHENERGAN) 25 MG tablet Take 25 mg by mouth every 6 (six) hours as needed. For nausea   Yes Historical Provider, MD  tiotropium (SPIRIVA) 18 MCG inhalation capsule Place 1 capsule (18 mcg total) into inhaler and inhale daily. 06/26/11  Yes Elease Etienne, MD  guaiFENesin-codeine (ROBITUSSIN AC) 100-10 MG/5ML syrup Take 5 mLs by mouth 4 (four) times daily as needed. For cough    Historical Provider, MD     Anti-infectives     Start     Dose/Rate Route Frequency Ordered Stop   07/01/12 2200   levofloxacin (LEVAQUIN) IVPB 500 mg        500 mg 100 mL/hr over 60 Minutes Intravenous Every 48 hours 06/29/12 2103     06/29/12 2200   levofloxacin (LEVAQUIN) IVPB 750 mg        750 mg 100 mL/hr over 90 Minutes Intravenous  Once 06/29/12 2103 06/30/12 0114   06/29/12 2200   vancomycin (VANCOCIN) 1,250 mg in sodium chloride 0.9 % 250 mL IVPB        1,250 mg 166.7 mL/hr over 90 Minutes Intravenous  Once 06/29/12 2103 06/30/12 0345   06/29/12 2130   ceFEPIme (MAXIPIME) 1 g in dextrose 5 % 50 mL IVPB        1 g 100 mL/hr over 30 Minutes Intravenous Every 24 hours 06/29/12 2103            Results for orders placed during the hospital encounter of 06/29/12 (from the past 48 hour(s))  CBC     Status: Abnormal   Collection Time   06/29/12  9:03 PM      Component Value Range Comment   WBC 8.7  4.0 - 10.5 K/uL    RBC 2.93 (*) 3.87 - 5.11 MIL/uL    Hemoglobin 9.6 (*) 12.0 - 15.0 g/dL    HCT 16.1 (*) 09.6 - 46.0 %    MCV 99.7  78.0 - 100.0 fL    MCH 32.8  26.0 - 34.0 pg    MCHC 32.9  30.0 - 36.0 g/dL    RDW 04.5 (*) 40.9 - 15.5 %    Platelets 129 (*) 150 - 400 K/uL   CREATININE, SERUM     Status: Abnormal   Collection Time   06/29/12  9:03 PM      Component Value Range Comment   Creatinine,  Ser 10.33 (*) 0.50 - 1.10 mg/dL    GFR calc non Af Amer 3 (*) >90 mL/min    GFR calc Af Amer 4 (*) >90 mL/min   BASIC METABOLIC PANEL     Status: Abnormal   Collection Time   06/30/12  6:10 AM      Component Value Range Comment   Sodium 139  135 - 145 mEq/L    Potassium 4.6  3.5 - 5.1 mEq/L    Chloride 96  96 - 112 mEq/L    CO2 25  19 - 32 mEq/L    Glucose, Bld 85  70 - 99 mg/dL    BUN 96 (*) 6 - 23 mg/dL    Creatinine, Ser 81.19 (*) 0.50 - 1.10 mg/dL    Calcium 7.0 (*) 8.4 - 10.5 mg/dL    GFR calc non Af Amer 3 (*) >90 mL/min    GFR calc Af Amer 4 (*) >90 mL/min   CBC     Status: Abnormal   Collection Time   06/30/12  6:10 AM      Component Value Range Comment   WBC 8.7  4.0 - 10.5 K/uL    RBC 2.88 (*) 3.87 - 5.11 MIL/uL    Hemoglobin 9.3 (*) 12.0 - 15.0 g/dL    HCT 14.7 (*) 82.9 - 46.0 %    MCV 100.3 (*) 78.0 - 100.0  fL    MCH 32.3  26.0 - 34.0 pg    MCHC 32.2  30.0 - 36.0 g/dL    RDW 16.1 (*) 09.6 - 15.5 %    Platelets 128 (*) 150 - 400 K/uL      ROS: as above in hpi/ Denies fevers, chills, or abdominal symptoms.Her AVF  previously had pain at button hole sites and "pus" at the area stopped since using regular needles now.   Physical Exam: Filed Vitals:   06/30/12 0913  BP: 133/56  Pulse: 76  Temp: 98 F (36.7 C)  Resp: 22     General: Alert Thin Elderly WF chronically illl. NAD HEENT: [place], MM dry Eyes: EOMI Neck: Supple ,mild jvd Heart: RRR, 1/6 sem lsb, norub Lungs:  Scattered rhonchi and Crackles Abdomen: BS =+, soft, notender Extremities: Bilateral Bloody knees wound abrasions covered with clear dressings Skin: see above knees Neuro: Alert OX3 ,moving all extrem. No acute deficits for her Dialysis Access: Pos. Bruit Large Left Upper Arm Avf  With mild Aneurysmal  area of healed button hole sites without dc expressed.  Dialysis Orders: Center: [facility]   on TTS . EDW 48.5 HD Bath 2.o k ,2.25 ca  Time 2hrs  45 min.  Heparin= no heparin  Access= Left upper arm  avf  BFR 350 DFR auto flow 1.5    Zemplar 0 mcg IV/HD    ipth 111 03/18/12  Epogen 4800   Units IV/HD  Venofer  0 tfs 44% 05/20/12  Other body quest ice cream q hd supplement  Assessment/Plan 1. SOB with vol  Overload= multiple  Etiologies=  Missed hd , copd ,and pna =  HD today on antibiotics VAnco, Maxipen, Levaquin IV/ prednisone  2. Falls/Bilat. Knee Abrasions+=needs skin care RN  eval , change dressings, ? PT evaluation 3. ESRD - TTS  Schedule ([facility]) hd today/ VVS eval avf  Button hole sites  Currently stable no dc 4. Hypertension/volume  - bp 133/56 uf as tol. For vol  2.8 kg over edw   ,?needs lower edw at dc  /fu wts bp / low dose Coreg 3.125 mg bid 5. Anemia  - hgb 9.3 , aranesp on hd  , no iron 6. Metabolic bone disease -  No vit d , binders  7. Nutrition - renal vitamin and supplement with breeze , high protein renal diet  Lenny Pastel, PA-C [facility] Beeper [phone] 06/30/2012, 12:04 PM   Patient seen and examined.  Agree with assessment and plan as above. Vinson Moselle  MD [employer] [phone] pgr    [phone] cell 06/30/2012, 2:38 PM

## 2012-06-30 NOTE — Consult Note (Signed)
[facility] [place] CONSULT NOTE 06/30/2012 DOB: [id number] MRN : [id number]  CC: Poorly functioning LUE AVF, ? buttonhole infection   History of Present Illness: [name] is a 79 y.o. female with HX PVD, ESRD on HD M,W,F who was to see Dr. Darrick Penna in our office this Thursday to evaluate poorly functioning LUE B-C AVF (date of Surg and Surgeon unknown). Pt states they have been having trouble using the fistula and it bleeds a lot after HD.  She has been treated for pneumonia by PCP and then came to [facility] for weakness and was transferred to [facility] last PM.  Dr Hart Rochester has seen the Pt in the past for PVD. She had bilateral iliac "kissing " stents placed by Dr. Myra Gianotti in Jan of 2011.  Past Medical History  Diagnosis Date  . Carotid artery occlusion     Bilateral carotid artery disease  . COPD (chronic obstructive pulmonary disease)   . Peripheral artery disease   . Arrhythmia     ? hx of atrial fibrillation  . Lightheadedness   . CHF (congestive heart failure)     EF 25-30%.   . Coronary artery disease 2008    post MI  . Hypertension   . Hyperlipidemia   . Chronic kidney disease 02/2007    End-stage on hemodialysis  . Dialysis patient 06/20/11    [name], F; [address], [place]  . AAA (abdominal aortic aneurysm)   . Stroke ` 2007  . Duodenal ulcer 2008  . Myocardial infarction 02/2007    "had 3 MI before my CABG"  . Heart murmur   . Anxiety   . Depression   . Pneumonia   . History of bronchitis   . SOB (shortness of breath)   . Shortness of breath on exertion   . On home oxygen therapy     prn  . Hypothyroidism   . Anemia   . Blood transfusion     Past Surgical History  Procedure Date  . Thyroid surgery 2004  . Dg av dialysis  shunt access exist*l* or 03/2000    left upper arm  . Vaginal hysterectomy 1976  . Hemorrhoid surgery 1971  . Insert / replace / remove pacemaker 1990's    initial placement  . Insert / replace / remove pacemaker 05/2011      "changed out"  . Incisional hernia repair 10/2010    incarcerated  . Common iliac 06/2009    bilateral kissing stents  . Pseudoaneurysm repair 09/1999    right groin  . Av fistula repair 04/2007    left upper arm  . Cardiac catheterization 02/10, 03/12    Most recent showed 3 vessel CAD with patent grafts: SVG to LAD, SVG to PDA and SVG to OM  . Coronary angioplasty with stent placement 09/1999    "1"  . Coronary artery bypass graft 02/2007    CABG X3     ROS: [x]  Positive  [ ]  Denies    General: [ ]  Weight loss, [ ]  Fever, [ ]  chills [x]  weakness Neurologic: [ ]  Dizziness, [ ]  Blackouts, [ ]  Seizure [ ]  Stroke, [ ]  "Mini stroke", [ ]  Slurred speech, [ ]  Temporary blindness; [ ]  weakness in arms or legs, [ ]  Hoarseness Cardiac: [ ]  Chest pain/pressure, [ ]  Shortness of breath at rest [ ]  Shortness of breath with exertion, [ ]  Atrial fibrillation or irregular heartbeat Vascular: [ ]  Pain in legs with walking, [ ]   Pain in legs at rest, [ ]  Pain in legs at night,  [ ]  Non-healing ulcer, [ ]  Blood clot in vein/DVT,   Pulmonary: [ ]  Home oxygen, [ x] Productive cough, [ ]  Coughing up blood, [ ]  Asthma,  [ ]  Wheezing Musculoskeletal:  [ ]  Arthritis, [ ]  Low back pain, [ ]  Joint pain Hematologic: [ ]  Easy Bruising, [ ]  Anemia; [ ]  Hepatitis Gastrointestinal: [ ]  Blood in stool, [ ]  Gastroesophageal Reflux/heartburn, [ ]  Trouble swallowing Urinary: [x ] chronic Kidney disease, x] on HD - [x ] MWF or [ ]  TTHS, [ ]  Burning with urination, [ ]  Difficulty urinating Skin: [ ]  Rashes, [ ]  Wounds Psychological: [ ]  Anxiety, [ ]  Depression  Social History History  Substance Use Topics  . Smoking status: Former Smoker -- 0.5 packs/day for 40 years    Types: Cigarettes    Quit date: 06/03/2006  . Smokeless tobacco: Never Used  . Alcohol Use: No    Family History History reviewed. No pertinent family history.  Allergies  Allergen Reactions  . Ivp Dye (Iodinated Diagnostic Agents)  Swelling  . Lisinopril Swelling  . Iohexol      Desc: itching, swelling     Current Facility-Administered Medications  Medication Dose Route Frequency Provider Last Rate Last Dose  . acetaminophen (TYLENOL) tablet 650 mg  650 mg Oral Q6H PRN Eduard Clos, MD       Or  . acetaminophen (TYLENOL) suppository 650 mg  650 mg Rectal Q6H PRN Eduard Clos, MD      . albuterol (PROVENTIL) (5 MG/ML) 0.5% nebulizer solution 2.5 mg  2.5 mg Nebulization Q2H PRN Eduard Clos, MD      . ALPRAZolam Prudy Feeler) tablet 0.25 mg  0.25 mg Oral BID PRN Eduard Clos, MD   0.25 mg at 06/29/12 2241  . antiseptic oral rinse (BIOTENE) solution 15 mL  15 mL Mouth Rinse BID Cristal Ford, MD      . atorvastatin (LIPITOR) tablet 80 mg  80 mg Oral QHS Eduard Clos, MD   80 mg at 06/29/12 2234  . benzonatate (TESSALON) capsule 200 mg  200 mg Oral TID PRN Eduard Clos, MD      . calcium acetate (PHOSLO) capsule 667 mg  667 mg Oral TID WC Eduard Clos, MD   667 mg at 06/30/12 0824  . carvedilol (COREG) tablet 6.25 mg  6.25 mg Oral BID WC Eduard Clos, MD   6.25 mg at 06/30/12 0824  . ceFEPIme (MAXIPIME) 1 g in dextrose 5 % 50 mL IVPB  1 g Intravenous Q24H Cristal Ford, MD   1 g at 06/29/12 2246  . cinacalcet (SENSIPAR) tablet 60 mg  60 mg Oral Daily Eduard Clos, MD      . clopidogrel (PLAVIX) tablet 75 mg  75 mg Oral Daily Eduard Clos, MD      . darbepoetin [facility]) injection 100 mcg  100 mcg Intravenous Q Thu-HD Donald Pore, PA      . docusate sodium (COLACE) capsule 100 mg  100 mg Oral BID Eduard Clos, MD      . feeding supplement (RESOURCE BREEZE) liquid 1 Container  1 Container Oral BID BM Haynes Bast, RD      . fluticasone (FLONASE) 50 MCG/ACT nasal spray 2 spray  2 spray Each Nare Daily PRN Eduard Clos, MD      . heparin injection 5,000 Units  5,000 Units Subcutaneous Q8H Eduard Clos, MD   5,000 Units at 06/30/12  0601  . isosorbide dinitrate (ISORDIL) tablet 20 mg  20 mg Oral TID Eduard Clos, MD   20 mg at 06/29/12 2234  . levofloxacin (LEVAQUIN) IVPB 500 mg  500 mg Intravenous Q48H Srikar Cherlynn Kaiser, MD      . levothyroxine (SYNTHROID, LEVOTHROID) tablet 150 mcg  150 mcg Oral QAC breakfast Cristal Ford, MD   150 mcg at 06/30/12 0824  . magic mouthwash  15 mL Oral QID PRN Leanne Chang, NP   15 mL at 06/29/12 2243  . multivitamin (RENA-VIT) tablet 1 tablet  1 tablet Oral QHS Donald Pore, PA      . nitroGLYCERIN (NITROSTAT) SL tablet 0.4 mg  0.4 mg Sublingual Q5 min PRN Eduard Clos, MD      . olopatadine (PATANOL) 0.1 % ophthalmic solution 1 drop  1 drop Both Eyes BID PRN Eduard Clos, MD      . ondansetron [facility]) tablet 4 mg  4 mg Oral Q6H PRN Eduard Clos, MD       Or  . ondansetron [facility]) injection 4 mg  4 mg Intravenous Q6H PRN Eduard Clos, MD      . oxyCODONE-acetaminophen (PERCOCET/ROXICET) 5-325 MG per tablet 1 tablet  1 tablet Oral Q8H PRN Cristal Ford, MD   1 tablet at 06/30/12 1610   And  . oxyCODONE (Oxy IR/ROXICODONE) immediate release tablet 2.5 mg  2.5 mg Oral Q8H PRN Cristal Ford, MD   2.5 mg at 06/30/12 0824  . pneumococcal 23 valent vaccine (PNU-IMMUNE) injection 0.5 mL  0.5 mL Intramuscular Tomorrow-1000 Srikar Cherlynn Kaiser, MD      . sodium chloride 0.9 % injection 3 mL  3 mL Intravenous Q12H Eduard Clos, MD   3 mL at 06/29/12 2246  . tiotropium (SPIRIVA) inhalation capsule 18 mcg  18 mcg Inhalation Daily Eduard Clos, MD   18 mcg at 06/30/12 1144  . zolpidem (AMBIEN) tablet 5 mg  5 mg Oral QHS PRN Eduard Clos, MD         Imaging: No results found.  Significant Diagnostic Studies: CBC Lab Results  Component Value Date   WBC 8.7 06/30/2012   HGB 9.3* 06/30/2012   HCT 28.9* 06/30/2012   MCV 100.3* 06/30/2012   PLT 128* 06/30/2012    BMET    Component Value Date/Time   NA 139 06/30/2012 0610   K 4.6  06/30/2012 0610   CL 96 06/30/2012 0610   CO2 25 06/30/2012 0610   GLUCOSE 85 06/30/2012 0610   BUN 96* 06/30/2012 0610   CREATININE 10.60* 06/30/2012 0610   CALCIUM 7.0* 06/30/2012 0610   GFRNONAA 3* 06/30/2012 0610   GFRAA 4* 06/30/2012 0610    COAG Lab Results  Component Value Date   INR 1.1 07/21/2008   INR 1.2 04/16/2007   INR 1.4 04/15/2007   No results found for this basename: PTT     Physical Examination BP Readings from Last 3 Encounters:  06/30/12 133/56  06/26/11 153/73  09/03/10 120/74   Temp Readings from Last 3 Encounters:  06/30/12 98 F (36.7 C) Oral  06/26/11 98.5 F (36.9 C) Oral   SpO2 Readings from Last 3 Encounters:  06/30/12 100%  06/26/11 96%  09/03/10 95%   Pulse Readings from Last 3 Encounters:  06/30/12 76  06/26/11 75  09/03/10 71    General:  WDWN in NAD HENT: WNL Eyes: Pupils equal Pulmonary: normal non-labored breathing  Cardiac: RRR Skin: no rashes, ulcers noted. Ecchymosis present BUE Vascular Exam/Pulses: 2+ Radial pulse Left UE B-C AVF is mildly aneurysmal in several places with + thrill and Bruit No cellulitis or other signs of infection noted Fistula is non-tender Extremities without ischemic changes, no Gangrene , no cellulitis; no open wounds;  Musculoskeletal: no muscle wasting or atrophy  Neurologic: A&O X 3; Appropriate Affect ;  SENSATION: normal; MOTOR FUNCTION: Pt has good and equal strength in BUE  - 5/5 Speech is fluent/normal  Non-Invasive Vascular Imaging: none  ASSESSMENT/PLAN: Pt. with poorly functioning Left B-C AVF, no infection noted. Once pt is stable from pneumonia standpoint we can do a fistulogram, poss later this week or as an outpt.  Durene Cal

## 2012-06-30 NOTE — Progress Notes (Signed)
INITIAL NUTRITION ASSESSMENT  DOCUMENTATION CODES Per approved criteria  -Not Applicable   INTERVENTION: 1. Resource Breeze po BID, each supplement provides 250 kcal and 9 grams of protein. 2. Recommend renal-vitamin daily 3. RD to continue to follow nutrition care plan  NUTRITION DIAGNOSIS: Inadequate oral intake related to acute illness as evidenced by pt repot.   Goal: Pt to meet >/= 90% of their estimated nutrition needs.  Monitor:  weight trends, lab trends, I/O's, PO intake, supplement tolerance  Reason for Assessment: Malnutrition Screening  79 y.o. female  Admitting Dx: SOB  ASSESSMENT: Admitted with SOB x 2 days, likely 2/2 missed HD session. Initially admitted to [facility], however was transferred to [facility] as pt may require HD. Also recently prescribed abx for possible PNA.  RD obtained nutrition hx from pt. She reports that her intake has been poor during her possible PNA symptoms, she also states that this is why she has missed HD. Did not eat breakfast today, states her appetite was poor. She also notes that all she was able to eat yesterday was half of a sandwich.  She notes that her weight has been trending down, however per EPIC weights, her weight has been fairly stable. Pt is at nutrition risk given her acute illness and poor po intake. She is agreeable to [employer] to help provide additional protein and kcal during this episode of poor po intake.  Height: Ht Readings from Last 1 Encounters:  06/29/12 5\' 3"  (1.6 m)    Weight: Wt Readings from Last 1 Encounters:  06/29/12 113 lb 1.6 oz (51.302 kg)    Ideal Body Weight: 115 lb  % Ideal Body Weight: 98%  Wt Readings from Last 10 Encounters:  06/29/12 113 lb 1.6 oz (51.302 kg)  06/26/11 117 lb 11.6 oz (53.4 kg)  09/03/10 119 lb 6.4 oz (54.159 kg)    Usual Body Weight: 117 - 119 lb  % Usual Body Weight: 96%  BMI:  Body mass index is 20.03 kg/(m^2). Weight is WNL.  Estimated Nutritional  Needs: Kcal: 1550 - 1800 kcal Protein: 60 - 75 grams protein Fluid: 1.2 liters daily  Skin: intact  Diet Order: Cardiac  EDUCATION NEEDS: -No education needs identified at this time   Intake/Output Summary (Last 24 hours) at 06/30/12 0826 Last data filed at 06/30/12 0602  Gross per 24 hour  Intake    570 ml  Output    300 ml  Net    270 ml    Last BM: 1/26  Labs:   Lab 06/30/12 0610 06/29/12 2103  NA 139 --  K 4.6 --  CL 96 --  CO2 25 --  BUN 96* --  CREATININE 10.60* 10.33*  CALCIUM 7.0* --  MG -- --  PHOS -- --  GLUCOSE 85 --    CBG (last 3)  No results found for this basename: GLUCAP:3 in the last 72 hours  Scheduled Meds:   . antiseptic oral rinse  15 mL Mouth Rinse BID  . atorvastatin  80 mg Oral QHS  . calcium acetate  667 mg Oral TID WC  . carvedilol  6.25 mg Oral BID WC  . ceFEPime (MAXIPIME) IV  1 g Intravenous Q24H  . cinacalcet  60 mg Oral Daily  . clopidogrel  75 mg Oral Daily  . docusate sodium  100 mg Oral BID  . furosemide  80 mg Oral BID  . heparin  5,000 Units Subcutaneous Q8H  . isosorbide dinitrate  20 mg Oral  TID  . levofloxacin (LEVAQUIN) IV  500 mg Intravenous Q48H  . levothyroxine  150 mcg Oral QAC breakfast  . pneumococcal 23 valent vaccine  0.5 mL Intramuscular Tomorrow-1000  . sodium chloride  3 mL Intravenous Q12H  . tiotropium  18 mcg Inhalation Daily    Continuous Infusions:   Past Medical History  Diagnosis Date  . Carotid artery occlusion     Bilateral carotid artery disease  . COPD (chronic obstructive pulmonary disease)   . Peripheral artery disease   . Arrhythmia     ? hx of atrial fibrillation  . Lightheadedness   . CHF (congestive heart failure)     EF 25-30%.   . Coronary artery disease 2008    post MI  . Hypertension   . Hyperlipidemia   . Chronic kidney disease 02/2007    End-stage on hemodialysis  . Dialysis patient 06/20/11    [name], F; [address] [place]  . AAA (abdominal aortic aneurysm)   .  Stroke ` 2007  . Duodenal ulcer 2008  . Myocardial infarction 02/2007    "had 3 MI before my CABG"  . Heart murmur   . Anxiety   . Depression   . Pneumonia   . History of bronchitis   . SOB (shortness of breath)   . Shortness of breath on exertion   . On home oxygen therapy     prn  . Hypothyroidism   . Anemia   . Blood transfusion     Past Surgical History  Procedure Date  . Thyroid surgery 2004  . Dg av dialysis  shunt access exist*l* or 03/2000    left upper arm  . Vaginal hysterectomy 1976  . Hemorrhoid surgery 1971  . Insert / replace / remove pacemaker 1990's    initial placement  . Insert / replace / remove pacemaker 05/2011    "changed out"  . Incisional hernia repair 10/2010    incarcerated  . Common iliac 06/2009    bilateral kissing stents  . Pseudoaneurysm repair 09/1999    right groin  . Av fistula repair 04/2007    left upper arm  . Cardiac catheterization 02/10, 03/12    Most recent showed 3 vessel CAD with patent grafts: SVG to LAD, SVG to PDA and SVG to OM  . Coronary angioplasty with stent placement 09/1999    "1"  . Coronary artery bypass graft 02/2007    CABG X3    Jarold Motto MS, RD, LDN Pager: [phone] After-hours pager: [phone]

## 2012-06-30 NOTE — Progress Notes (Signed)
Utilization review completed.  

## 2012-06-30 NOTE — Progress Notes (Signed)
PROGRESS NOTE  [name] RUE:[id number] DOB: 11/21/1932 DOA: 06/29/2012 PCP: Galvin Proffer, MD  Brief narrative: 79 yr old female admitted 1.27 with SOB felt to be likely 2/2 to PNA  Past medical history-As per Problem list Chart reviewed as below-  Admission 1.17.13 for COPD exacerbation-given oral Abx and Steroids  Admit 5.25.12 for incarceratd incisional hernia  Admission 1.27.09 for CHF and vol overload  Gastroesophageal bleeding secondary to duodenal ulcer in 2008  Admission 11.2.0  H/o Biotronik Philos DR pacer which was inserted secondary to PAT  Coronary artery disease status post CABG in September 2000 at [facility].  CVA requiring Coumadin in the past.  Consultants:  Nephrology  Procedures:  None currently, CXR at OSH showed Vasc congestion  Antibiotics:  Cefepime 1.27  Vanc 1.27  Levaquin 1.27    Subjective  Feels fair.  No signifcant CODb or WOB. Tolerating diet.  No Cp   Objective    Interim History: none Telemetry: None tele  Objective: Filed Vitals:   06/29/12 1900 06/29/12 2051 06/30/12 0418 06/30/12 0913  BP: 148/35 120/42 110/49 133/56  Pulse: 77 80 74 76  Temp: 98.5 F (36.9 C) 97.7 F (36.5 C) 98.3 F (36.8 C) 98 F (36.7 C)  TempSrc: Oral Oral Oral Oral  Resp:  21 20 22   Height:  5\' 3"  (1.6 m)    Weight:  51.302 kg (113 lb 1.6 oz)    SpO2:  100% 100% 100%    Intake/Output Summary (Last 24 hours) at 06/30/12 1055 Last data filed at 06/30/12 0914  Gross per 24 hour  Intake    690 ml  Output    300 ml  Net    390 ml    Exam:  General: alert pleasant oriented, on nasal O2.   Respiratory: CTa b, no added sound Abdomen: soft NT/ND Neuro grossly intact moving all 4 limbs, no facial assymmetry   Data Reviewed: Basic Metabolic Panel:  Lab 06/30/12 1478 06/29/12 2103  NA 139 --  K 4.6 --  CL 96 --  CO2 25 --  GLUCOSE 85 --  BUN 96* --  CREATININE 10.60* 10.33*  CALCIUM 7.0* --  MG -- --    PHOS -- --   Liver Function Tests: No results found for this basename: AST:5,ALT:5,ALKPHOS:5,BILITOT:5,PROT:5,ALBUMIN:5 in the last 168 hours No results found for this basename: LIPASE:5,AMYLASE:5 in the last 168 hours No results found for this basename: AMMONIA:5 in the last 168 hours CBC:  Lab 06/30/12 0610 06/29/12 2103  WBC 8.7 8.7  NEUTROABS -- --  HGB 9.3* 9.6*  HCT 28.9* 29.2*  MCV 100.3* 99.7  PLT 128* 129*   Cardiac Enzymes: No results found for this basename: CKTOTAL:5,CKMB:5,CKMBINDEX:5,TROPONINI:5 in the last 168 hours BNP: No components found with this basename: POCBNP:5 CBG: No results found for this basename: GLUCAP:5 in the last 168 hours  No results found for this or any previous visit (from the past 240 hour(s)).   Studies:              All Imaging reviewed and is as per above notation   Scheduled Meds:   . antiseptic oral rinse  15 mL Mouth Rinse BID  . atorvastatin  80 mg Oral QHS  . calcium acetate  667 mg Oral TID WC  . carvedilol  6.25 mg Oral BID WC  . ceFEPime (MAXIPIME) IV  1 g Intravenous Q24H  . cinacalcet  60 mg Oral Daily  . clopidogrel  75  mg Oral Daily  . docusate sodium  100 mg Oral BID  . feeding supplement  1 Container Oral BID BM  . furosemide  80 mg Oral BID  . heparin  5,000 Units Subcutaneous Q8H  . isosorbide dinitrate  20 mg Oral TID  . levofloxacin (LEVAQUIN) IV  500 mg Intravenous Q48H  . levothyroxine  150 mcg Oral QAC breakfast  . pneumococcal 23 valent vaccine  0.5 mL Intramuscular Tomorrow-1000  . sodium chloride  3 mL Intravenous Q12H  . tiotropium  18 mcg Inhalation Daily   Continuous Infusions:    Assessment/Plan: 1. HCAP-continue Vanc/Zosyn/Cefepime.  Rpt labs am.  Monitor O2 needs 2. Fluid overload 2/2 to missed dialysis-Consulted nephrology for Dialysis which was missed.  Appreciate input 3. CAD stable at present-cont Coreg 6.26 bid, Isordil 20 tid-continue Plavix 75 daily 4. ESRD-per Nephro-appreciate  input 5. COPD-continue Albuterol 2.5 mg q2 prn, Tiotropium 18 mcg-check desat screen 6. Anxiety/Bipolar-alprazolam 0.25 bid prn.  Code Status: FUll code Family Communication:  None at present Disposition Plan:  Inpatient   Pleas Koch, MD  [facility] Pager [phone] 06/30/2012, 10:55 AM    LOS: 1 day

## 2012-06-30 NOTE — Procedures (Signed)
I was present at this dialysis session. I have reviewed the session itself and made appropriate changes.   Rob Cartrell Bentsen, MD [facility] 06/30/2012, 2:20 PM   

## 2012-07-01 ENCOUNTER — Encounter: Payer: Self-pay | Admitting: Vascular Surgery

## 2012-07-01 DIAGNOSIS — I509 Heart failure, unspecified: Secondary | ICD-10-CM

## 2012-07-01 DIAGNOSIS — F341 Dysthymic disorder: Secondary | ICD-10-CM

## 2012-07-01 DIAGNOSIS — J811 Chronic pulmonary edema: Secondary | ICD-10-CM | POA: Diagnosis present

## 2012-07-01 DIAGNOSIS — J441 Chronic obstructive pulmonary disease with (acute) exacerbation: Secondary | ICD-10-CM | POA: Diagnosis present

## 2012-07-01 DIAGNOSIS — J189 Pneumonia, unspecified organism: Secondary | ICD-10-CM | POA: Diagnosis present

## 2012-07-01 LAB — INFLUENZA PANEL BY PCR (TYPE A & B)
H1N1 flu by pcr: NOT DETECTED
Influenza A By PCR: NEGATIVE
Influenza B By PCR: NEGATIVE

## 2012-07-01 LAB — MRSA PCR SCREENING: MRSA by PCR: NEGATIVE

## 2012-07-01 MED ORDER — DIPHENHYDRAMINE HCL 50 MG/ML IJ SOLN
25.0000 mg | Freq: Once | INTRAMUSCULAR | Status: AC
  Administered 2012-07-02: 25 mg via INTRAVENOUS
  Filled 2012-07-01: qty 1

## 2012-07-01 MED ORDER — DEXTROSE 5 % IV SOLN
1.0000 g | INTRAVENOUS | Status: DC
  Filled 2012-07-01: qty 1

## 2012-07-01 MED ORDER — ALBUTEROL SULFATE (5 MG/ML) 0.5% IN NEBU
2.5000 mg | INHALATION_SOLUTION | Freq: Four times a day (QID) | RESPIRATORY_TRACT | Status: DC
  Administered 2012-07-01 – 2012-07-02 (×5): 2.5 mg via RESPIRATORY_TRACT
  Filled 2012-07-01 (×6): qty 0.5

## 2012-07-01 MED ORDER — ASPIRIN 81 MG PO CHEW
81.0000 mg | CHEWABLE_TABLET | Freq: Every day | ORAL | Status: DC
  Administered 2012-07-01 – 2012-07-06 (×6): 81 mg via ORAL
  Filled 2012-07-01 (×6): qty 1

## 2012-07-01 MED ORDER — LORATADINE 10 MG PO TABS
10.0000 mg | ORAL_TABLET | Freq: Every day | ORAL | Status: DC
  Administered 2012-07-01 – 2012-07-06 (×6): 10 mg via ORAL
  Filled 2012-07-01 (×6): qty 1

## 2012-07-01 MED ORDER — METHYLPREDNISOLONE SODIUM SUCC 125 MG IJ SOLR
100.0000 mg | Freq: Once | INTRAMUSCULAR | Status: AC
  Administered 2012-07-02: 100 mg via INTRAVENOUS
  Filled 2012-07-01: qty 1.6

## 2012-07-01 MED ORDER — IPRATROPIUM BROMIDE 0.02 % IN SOLN
0.5000 mg | Freq: Four times a day (QID) | RESPIRATORY_TRACT | Status: DC
  Administered 2012-07-01 – 2012-07-02 (×5): 0.5 mg via RESPIRATORY_TRACT
  Filled 2012-07-01 (×6): qty 2.5

## 2012-07-01 MED ORDER — FOLIC ACID 1 MG PO TABS
1.0000 mg | ORAL_TABLET | Freq: Every day | ORAL | Status: DC
  Administered 2012-07-01 – 2012-07-06 (×6): 1 mg via ORAL
  Filled 2012-07-01 (×6): qty 1

## 2012-07-01 MED ORDER — VANCOMYCIN HCL 500 MG IV SOLR
500.0000 mg | INTRAVENOUS | Status: DC
  Administered 2012-07-02: 500 mg via INTRAVENOUS
  Filled 2012-07-01 (×2): qty 500

## 2012-07-01 MED ORDER — FAMOTIDINE IN NACL 20-0.9 MG/50ML-% IV SOLN
20.0000 mg | Freq: Once | INTRAVENOUS | Status: AC
  Administered 2012-07-02: 20 mg via INTRAVENOUS
  Filled 2012-07-01: qty 50

## 2012-07-01 NOTE — Progress Notes (Signed)
ANTIBIOTIC CONSULT NOTE - FOLLOW UP  Pharmacy Consult for Vancomycin, Maxipime, Levaquin Indication: pneumonia (HCAP) coverage  Patient Measurements: Height: 5\' 3"  (160 cm) Weight: 114 lb 6.7 oz (51.9 kg) IBW/kg (Calculated) : 52.4   Vital Signs: Temp: 98.3 F (36.8 C) (01/29 0830) Temp src: Oral (01/29 0830) BP: 110/55 mmHg (01/29 0830) Pulse Rate: 88  (01/29 0830)  Labs:  Basename 06/30/12 0610 06/29/12 2103  WBC 8.7 8.7  HGB 9.3* 9.6*  PLT 128* 129*  LABCREA -- --  CREATININE 10.60* 10.33*   Assessment:  Day # 2 antibiotics.  MRSA screen negative. No culture data. Afebrile.    Noted shortened HD session 1/28. 2 hrs 45 min planned for 1/30. For fistulogram when pneumonia clears.   Goal of Therapy:  pre-dialysis vancomycin levels 15-25 mcg/ml  Plan:   Vancomycin 500 IV after each HD session, TTS.  Will consider checking pre-HD vanc trough if >5-7 days tx.  Maxipime 1 gram IV q24hrs for now.  Levaquin 500 mg IV q48hrs - next due tonight.  Dennie Fetters, RPh Pager: [phone] 07/01/2012,3:38 PM

## 2012-07-01 NOTE — Progress Notes (Signed)
Patient ID: [name]  female  ZOX:[id number]    DOB: July 08, 1932    DOA: 06/29/2012  PCP: Galvin Proffer, MD  Assessment/Plan: Principal Problem:  *HCAP (healthcare-associated pneumonia) - Continue Levaquin and cefepime, patient received vancomycin on 06/30/2012 (hemodialysis patient) Active Problems:  Coronary artery disease: Currently stable, continue aspirin, Coreg, Lipitor, Plavix   ESRD (end stage renal disease) on dialysis with pulmonary edema:  -Patient had missed dialysis, renal was consulted, underwent hemodialysis on 1/28 per routine - Vascular surgery consulted due to poorly functional left BC AVF, recommending a fistulogram later this week or outpatient    SOB (shortness of breath)/  COPD with exacerbation: Also likely secondary to pulmonary edema from missing dialysis - Placed on scheduled albuterol and Atrovent nebs, O2, Tessalon  Hypothyroidism: Continue Synthroid  DVT Prophylaxis:  Code Status: FC  Disposition:  when medically stable  Subjective: On examination, patient complaining of wheezing and shortness of breath, no chest pain no fever, chills  Objective: Weight change: 0.998 kg (2 lb 3.2 oz)  Intake/Output Summary (Last 24 hours) at 07/01/12 1007 Last data filed at 07/01/12 0900  Gross per 24 hour  Intake    460 ml  Output   2500 ml  Net  -2040 ml   Blood pressure 110/55, pulse 88, temperature 98.3 F (36.8 C), temperature source Oral, resp. rate 20, height 5\' 3"  (1.6 m), weight 51.9 kg (114 lb 6.7 oz), SpO2 100.00%.  Physical Exam: General: Alert and awake, oriented x3, not in any acute distress. HEENT: anicteric sclera, pupils reactive to light and accommodation, EOMI CVS: S1-S2 clear, no murmur rubs or gallops Chest: Bilateral wheezing and rhonchi  Abdomen: soft nontender, nondistended, normal bowel sounds, no organomegaly Extremities: no cyanosis, clubbing or edema noted bilaterally Neuro: Cranial nerves II-XII intact, no focal neurological  deficits  Lab Results: Basic Metabolic Panel:  Lab 06/30/12 8119 06/29/12 2103  NA 139 --  K 4.6 --  CL 96 --  CO2 25 --  GLUCOSE 85 --  BUN 96* --  CREATININE 10.60* 10.33*  CALCIUM 7.0* --  MG -- --  PHOS -- --   CBC:  Lab 06/30/12 0610 06/29/12 2103  WBC 8.7 8.7  NEUTROABS -- --  HGB 9.3* 9.6*  HCT 28.9* 29.2*  MCV 100.3* 99.7  PLT 128* 129*     Micro Results: Recent Results (from the past 240 hour(s))  MRSA PCR SCREENING     Status: Normal   Collection Time   06/30/12  8:46 PM      Component Value Range Status Comment   MRSA by PCR NEGATIVE  NEGATIVE Final     Studies/Results: No results found.  Medications: Scheduled Meds:   . albuterol  2.5 mg Nebulization Q6H  . antiseptic oral rinse  15 mL Mouth Rinse BID  . aspirin  81 mg Oral Daily  . atorvastatin  80 mg Oral QHS  . calcium acetate  667 mg Oral TID WC  . carvedilol  6.25 mg Oral BID WC  . ceFEPime (MAXIPIME) IV  1 g Intravenous Q24H  . cinacalcet  60 mg Oral Daily  . clopidogrel  75 mg Oral Daily  . darbepoetin (ARANESP) injection - DIALYSIS  100 mcg Intravenous Q Thu-HD  . docusate sodium  100 mg Oral BID  . feeding supplement  1 Container Oral BID BM  . folic acid  1 mg Oral Daily  . heparin  5,000 Units Subcutaneous Q8H  . ipratropium  0.5 mg Nebulization Q6H  .  isosorbide dinitrate  20 mg Oral TID  . levofloxacin (LEVAQUIN) IV  500 mg Intravenous Q48H  . levothyroxine  150 mcg Oral QAC breakfast  . loratadine  10 mg Oral Daily  . multivitamin  1 tablet Oral QHS  . sodium chloride  3 mL Intravenous Q12H      LOS: 2 days   Oakley Kossman M.D. [facility] 07/01/2012, 10:07 AM Pager: [phone]  If 7PM-7AM, please contact night-coverage [email or web address] Password [email or web address]

## 2012-07-01 NOTE — Progress Notes (Signed)
Subjective: "Just not feeling well'  Wanted to get off hd  Early yesterday/ had majority of her time 2 hrs 24 min/ legs feel better and sob somewhat better getting bath now/// note history from [facility] pt. [facility] RN  she is signing off 1 hr early past several txs Objective Vital signs in last 24 hours: Filed Vitals:   06/30/12 2325 07/01/12 0508 07/01/12 0827 07/01/12 0830  BP: 104/63 114/62  110/55  Pulse: 77 92  88  Temp: 97.6 F (36.4 C) 98.1 F (36.7 C)  98.3 F (36.8 C)  TempSrc: Oral Oral  Oral  Resp: 19 18  20   Height:      Weight: 51.9 kg (114 lb 6.7 oz)     SpO2: 97% 94% 100% 100%   Weight change: 0.998 kg (2 lb 3.2 oz)  Intake/Output Summary (Last 24 hours) at 07/01/12 1122 Last data filed at 07/01/12 0900  Gross per 24 hour  Intake    460 ml  Output   2500 ml  Net  -2040 ml   Labs: Basic Metabolic Panel:  Lab 06/30/12 1610 06/29/12 2103  NA 139 --  K 4.6 --  CL 96 --  CO2 25 --  GLUCOSE 85 --  BUN 96* --  CREATININE 10.60* 10.33*  CALCIUM 7.0* --  ALB -- --  PHOS -- --   Liver Function Tests: No results found for this basename: AST:3,ALT:3,ALKPHOS:3,BILITOT:3,PROT:3,ALBUMIN:3 in the last 168 hours No results found for this basename: LIPASE:3,AMYLASE:3 in the last 168 hours No results found for this basename: AMMONIA:3 in the last 168 hours CBC:  Lab 06/30/12 0610 06/29/12 2103  WBC 8.7 8.7  NEUTROABS -- --  HGB 9.3* 9.6*  HCT 28.9* 29.2*  MCV 100.3* 99.7  PLT 128* 129*   Cardiac Enzymes: No results found for this basename: CKTOTAL:5,CKMB:5,CKMBINDEX:5,TROPONINI:5 in the last 168 hours CBG: No results found for this basename: GLUCAP:5 in the last 168 hours  Iron Studies: No results found for this basename: IRON,TIBC,TRANSFERRIN,FERRITIN in the last 72 hours Studies/Results: No results found. Medications:      . albuterol  2.5 mg Nebulization Q6H  . antiseptic oral rinse  15 mL Mouth Rinse BID  . aspirin  81 mg Oral Daily  .  atorvastatin  80 mg Oral QHS  . calcium acetate  667 mg Oral TID WC  . carvedilol  6.25 mg Oral BID WC  . ceFEPime (MAXIPIME) IV  1 g Intravenous Q24H  . cinacalcet  60 mg Oral Daily  . clopidogrel  75 mg Oral Daily  . darbepoetin (ARANESP) injection - DIALYSIS  100 mcg Intravenous Q Thu-HD  . docusate sodium  100 mg Oral BID  . feeding supplement  1 Container Oral BID BM  . folic acid  1 mg Oral Daily  . heparin  5,000 Units Subcutaneous Q8H  . ipratropium  0.5 mg Nebulization Q6H  . isosorbide dinitrate  20 mg Oral TID  . levofloxacin (LEVAQUIN) IV  500 mg Intravenous Q48H  . levothyroxine  150 mcg Oral QAC breakfast  . loratadine  10 mg Oral Daily  . multivitamin  1 tablet Oral QHS  . sodium chloride  3 mL Intravenous Q12H   I  have reviewed scheduled and prn medications.  Physical Exam: General: Alert . NAD , Not dyspneic   O2 sat 97% Lungs: Scattered rhonchi and Crackles  Abdomen: BS =+, soft, notender  Extremities: Bilateral  knees with clean new dressings/ nop pedal edema Dialysis Access: Pos.  Bruit Large Left Upper Arm Avf With mild Aneurysmal areas    Dialysis Orders: Center: [facility] on TTS .  EDW 48.5 HD Bath 2.o k ,2.25 ca Time 2hrs 45 min. Heparin= no heparin Access= Left upper arm avf BFR 350 DFR auto flow 1.5  Zemplar 0 mcg IV/HD ipth 111 03/18/12 Epogen 4800 Units IV/HD Venofer 0 tfs 44% 05/20/12  Other body quest ice cream q hd supplement   Assessment/Plan  1. SOB with vol Overload= multiple Etiologies= Missed hd ,COPD ,and pna = HD yesterday with 2.5 l uf   ~~0.81 kg above edw post hd/  o2 sat okay / hd in am on antibiotics VAnco, Maxipen, Levaquin IV/ prednisone  2. Falls/Bilat. Knee Abrasions= stable /no cos with dressings changes , fu  With ho Dr. Hart [place] Stenting Iliac dz. In past 3. ESRD - TTS Schedule ([facility]) hd yesterday/ noted BUN 96  Pre hd  She needs better compliance with tx time, 4.  ESRD ACCESS= [facility] eval noted  Needs Fistulogram 5. Hypertension/volume - bp  110/56 this am/ uf as tol. In am  Only .8 kgover edw ,?needs lower edw at dc /fu wts bp / low dose Coreg 3.125 mg bid 6. Anemia - hgb 9.3 , aranesp on hd , no iron 7. Metabolic bone disease - No vit d , binders  8. Nutrition - renal vitamin and supplement with breeze , high protein renal diet  Lenny Pastel, PA-C [facility] [phone] 07/01/2012,11:23 AM  LOS: 2 days   Patient seen and examined.  Agree with assessment and plan as above. Vinson Moselle  MD [facility] [phone] pgr    [phone] cell 07/01/2012, 12:47 PM

## 2012-07-02 ENCOUNTER — Inpatient Hospital Stay (HOSPITAL_COMMUNITY): Payer: Medicare Other

## 2012-07-02 ENCOUNTER — Ambulatory Visit (HOSPITAL_COMMUNITY): Admit: 2012-07-02 | Payer: Self-pay | Admitting: Vascular Surgery

## 2012-07-02 ENCOUNTER — Encounter: Payer: Medicare Other | Admitting: Vascular Surgery

## 2012-07-02 ENCOUNTER — Encounter (HOSPITAL_COMMUNITY)
Admission: AD | Disposition: A | Discharge status: Home Health | Payer: Self-pay | Source: Other Acute Inpatient Hospital | Attending: Internal Medicine

## 2012-07-02 DIAGNOSIS — E785 Hyperlipidemia, unspecified: Secondary | ICD-10-CM

## 2012-07-02 DIAGNOSIS — T82898A Other specified complication of vascular prosthetic devices, implants and grafts, initial encounter: Secondary | ICD-10-CM

## 2012-07-02 HISTORY — PX: SHUNTOGRAM: SHX5491

## 2012-07-02 LAB — CREATININE, SERUM
Creatinine, Ser: 7.01 mg/dL — ABNORMAL HIGH (ref 0.50–1.10)
GFR calc non Af Amer: 5 mL/min — ABNORMAL LOW (ref >90)

## 2012-07-02 LAB — CBC
Hemoglobin: 9.2 g/dL — ABNORMAL LOW (ref 12.0–15.0)
MCHC: 32.2 g/dL (ref 30.0–36.0)
Platelets: 118 10*3/uL — ABNORMAL LOW (ref 150–400)
RDW: 16.1 % — ABNORMAL HIGH (ref 11.5–15.5)

## 2012-07-02 LAB — RENAL FUNCTION PANEL
Albumin: 2.5 g/dL — ABNORMAL LOW (ref 3.5–5.2)
BUN: 55 mg/dL — ABNORMAL HIGH (ref 6–23)
Creatinine, Ser: 7.11 mg/dL — ABNORMAL HIGH (ref 0.50–1.10)
Phosphorus: 5.3 mg/dL — ABNORMAL HIGH (ref 2.3–4.6)

## 2012-07-02 SURGERY — ASSESSMENT, SHUNT FUNCTION, WITH CONTRAST RADIOGRAPHIC STUDY
Anesthesia: LOCAL

## 2012-07-02 MED ORDER — HEPARIN SODIUM (PORCINE) 5000 UNIT/ML IJ SOLN
5000.0000 [IU] | Freq: Three times a day (TID) | INTRAMUSCULAR | Status: DC
  Administered 2012-07-02 – 2012-07-06 (×11): 5000 [IU] via SUBCUTANEOUS
  Filled 2012-07-02 (×15): qty 1

## 2012-07-02 MED ORDER — DARBEPOETIN ALFA-POLYSORBATE 100 MCG/0.5ML IJ SOLN
INTRAMUSCULAR | Status: AC
  Administered 2012-07-02: 100 ug via INTRAVENOUS
  Filled 2012-07-02: qty 0.5

## 2012-07-02 MED ORDER — PREDNISONE 20 MG PO TABS
40.0000 mg | ORAL_TABLET | Freq: Every day | ORAL | Status: DC
  Administered 2012-07-02 – 2012-07-04 (×3): 40 mg via ORAL
  Filled 2012-07-02 (×6): qty 2

## 2012-07-02 MED ORDER — ACETAMINOPHEN 325 MG PO TABS
650.0000 mg | ORAL_TABLET | ORAL | Status: DC | PRN
  Filled 2012-07-02: qty 2

## 2012-07-02 MED ORDER — ACETAMINOPHEN 325 MG PO TABS: 650.0000 mg | ORAL_TABLET | ORAL | Status: DC | PRN

## 2012-07-02 MED ORDER — LEVOTHYROXINE SODIUM 137 MCG PO TABS
137.0000 ug | ORAL_TABLET | Freq: Every day | ORAL | Status: DC
  Administered 2012-07-03 – 2012-07-06 (×4): 137 ug via ORAL
  Filled 2012-07-02 (×6): qty 1

## 2012-07-02 MED ORDER — SODIUM CHLORIDE 0.9 % IJ SOLN
3.0000 mL | Freq: Two times a day (BID) | INTRAMUSCULAR | Status: DC
  Administered 2012-07-02 – 2012-07-06 (×7): 3 mL via INTRAVENOUS

## 2012-07-02 MED ORDER — SODIUM CHLORIDE 0.9 % IV SOLN: 250.0000 mL | INTRAVENOUS | Status: DC | PRN

## 2012-07-02 MED ORDER — ALBUTEROL SULFATE (5 MG/ML) 0.5% IN NEBU
2.5000 mg | INHALATION_SOLUTION | Freq: Four times a day (QID) | RESPIRATORY_TRACT | Status: DC
  Administered 2012-07-03 (×3): 2.5 mg via RESPIRATORY_TRACT
  Administered 2012-07-04: 5 mg via RESPIRATORY_TRACT
  Administered 2012-07-04 – 2012-07-05 (×4): 2.5 mg via RESPIRATORY_TRACT
  Filled 2012-07-02 (×10): qty 0.5

## 2012-07-02 MED ORDER — IPRATROPIUM BROMIDE 0.02 % IN SOLN
0.5000 mg | Freq: Four times a day (QID) | RESPIRATORY_TRACT | Status: DC
  Administered 2012-07-03 – 2012-07-05 (×8): 0.5 mg via RESPIRATORY_TRACT
  Filled 2012-07-02 (×10): qty 2.5

## 2012-07-02 MED ORDER — ONDANSETRON HCL 4 MG/2ML IJ SOLN
4.0000 mg | Freq: Four times a day (QID) | INTRAMUSCULAR | Status: DC | PRN
  Administered 2012-07-03: 4 mg via INTRAVENOUS
  Filled 2012-07-02: qty 2

## 2012-07-02 MED ORDER — SODIUM CHLORIDE 0.9 % IJ SOLN: 3.0000 mL | INTRAMUSCULAR | Status: DC | PRN

## 2012-07-02 MED ORDER — DEXTROSE 5 % IV SOLN
2.0000 g | INTRAVENOUS | Status: DC
  Administered 2012-07-02: 2 g via INTRAVENOUS
  Filled 2012-07-02 (×2): qty 2

## 2012-07-02 NOTE — Progress Notes (Signed)
Respiratory protocol done and patient scored at a level where treatments could be QID.Patient take treatments at home QID so no ned for further changes. Continuing on O@ per home also at 2L. No other changes at this time.

## 2012-07-02 NOTE — Progress Notes (Signed)
Subjective:  Feels that dry wt is too low, felt "bad" and signed off early from HD last night, came off at 49.6kg. Dry wt is 48.5kg. Says the same thing has been happening at outpt HD   Objective Vital signs in last 24 hours: Filed Vitals:   07/02/12 0448 07/02/12 0859 07/02/12 0900 07/02/12 1015  BP: 122/56  103/55 120/47  Pulse: 79 75 81 59  Temp: 97.3 F (36.3 C)  98.7 F (37.1 C) 98.6 F (37 C)  TempSrc: Oral  Oral Oral  Resp: 20  20 20   Height:      Weight:      SpO2: 100%  95% 96%   Weight change: -0.856 kg (-1 lb 14.2 oz)  Intake/Output Summary (Last 24 hours) at 07/02/12 1201 Last data filed at 07/02/12 0450  Gross per 24 hour  Intake    480 ml  Output      0 ml  Net    480 ml   Labs: Basic Metabolic Panel:  Lab 07/02/12 1914 06/30/12 0610 06/29/12 2103  NA -- 139 --  K -- 4.6 --  CL -- 96 --  CO2 -- 25 --  GLUCOSE -- 85 --  BUN -- 96* --  CREATININE 7.01* 10.60* 10.33*  CALCIUM -- 7.0* --  ALB -- -- --  PHOS -- -- --   Liver Function Tests: No results found for this basename: AST:3,ALT:3,ALKPHOS:3,BILITOT:3,PROT:3,ALBUMIN:3 in the last 168 hours No results found for this basename: LIPASE:3,AMYLASE:3 in the last 168 hours No results found for this basename: AMMONIA:3 in the last 168 hours CBC:  Lab 07/02/12 0945 06/30/12 0610 06/29/12 2103  WBC 9.3 8.7 8.7  NEUTROABS -- -- --  HGB 9.2* 9.3* 9.6*  HCT 28.6* 28.9* 29.2*  MCV 100.0 100.3* 99.7  PLT 118* 128* 129*   Cardiac Enzymes: No results found for this basename: CKTOTAL:5,CKMB:5,CKMBINDEX:5,TROPONINI:5 in the last 168 hours CBG: No results found for this basename: GLUCAP:5 in the last 168 hours  Iron Studies: No results found for this basename: IRON,TIBC,TRANSFERRIN,FERRITIN in the last 72 hours Studies/Results: No results found. Medications:      . albuterol  2.5 mg Nebulization Q6H  . antiseptic oral rinse  15 mL Mouth Rinse BID  . aspirin  81 mg Oral Daily  . atorvastatin  80 mg  Oral QHS  . calcium acetate  667 mg Oral TID WC  . carvedilol  6.25 mg Oral BID WC  . ceFEPime (MAXIPIME) IV  2 g Intravenous Q T,Th,Sat-1800  . cinacalcet  60 mg Oral Daily  . clopidogrel  75 mg Oral Daily  . darbepoetin (ARANESP) injection - DIALYSIS  100 mcg Intravenous Q Thu-HD  . docusate sodium  100 mg Oral BID  . feeding supplement  1 Container Oral BID BM  . folic acid  1 mg Oral Daily  . heparin  5,000 Units Subcutaneous Q8H  . ipratropium  0.5 mg Nebulization Q6H  . isosorbide dinitrate  20 mg Oral TID  . levofloxacin (LEVAQUIN) IV  500 mg Intravenous Q48H  . levothyroxine  150 mcg Oral QAC breakfast  . loratadine  10 mg Oral Daily  . multivitamin  1 tablet Oral QHS  . sodium chloride  3 mL Intravenous Q12H  . vancomycin  500 mg Intravenous Q T,Th,Sa-HD   I  have reviewed scheduled and prn medications.  Physical Exam: General: Alert . NAD , Not dyspneic   O2 sat 97% Lungs: clear on R , occ crackles L base  Abdomen: BS =+, soft, notender  Extremities: Bilateral  knees with clean new dressings/ no pedal edema Dialysis Access: Pos. Bruit Large Left Upper Arm Avf With mild Aneurysmal areas    Dialysis Orders: Center: [facility] on TTS .  EDW 48.5 HD Bath 2.o k ,2.25 ca Time 2hrs 45 min. Heparin= no heparin Access= Left upper arm avf BFR 350 DFR auto flow 1.5  Zemplar 0 mcg IV/HD ipth 111 03/18/12 Epogen 4800 Units IV/HD Venofer 0 tfs 44% 05/20/12  Other body quest ice cream q hd supplement   Assessment/Plan  1. Dyspnea- not sure if there is a volume component or not, pt feels we are "drying her out too much".  This could all be due to COPD, etc, and not fluid, but not sure. Will get repeat cxr today..  On VAnco, Maxipen, Levaquin IV prednisone. 2. Falls/Bilat. Knee Abrasions= stable /no cos with dressings changes , fu  With ho Dr. Hart [place] Stenting Iliac dz. In past 3. ESRD - TTS Schedule ([facility]) hd yesterday/ noted BUN 96  Pre hd  She needs better compliance with tx time, 4.  ESRD  ACCESS= [facility] eval noted  Needs Fistulogram 5. Hypertension/volume - bp 110/56 this am 6. Anemia - hgb 9.3 , aranesp on hd , no iron 7. Metabolic bone disease - No vit d , binders  8. Nutrition - renal vitamin and supplement with breeze , high protein renal diet   Vinson Moselle  MD [facility] [phone] pgr    [phone] cell 07/02/2012, 12:01 PM

## 2012-07-02 NOTE — Op Note (Signed)
OPERATIVE NOTE   PROCEDURE: 1. left brachiocephalic arteriovenous fistula cannulation under ultrasound guidance 2. left arm fistulogram  PRE-OPERATIVE DIAGNOSIS: Bleeding left brachiocephalic arteriovenous fistula  POST-OPERATIVE DIAGNOSIS: same as above   SURGEON: Leonides Sake, MD  ANESTHESIA: local  ESTIMATED BLOOD LOSS: 5 cc  FINDING(S): 1. Widely patent central venous system 2. Widely patent aneurysmal left brachiocephalic arteriovenous fistula    SPECIMEN(S):  None  CONTRAST: 30 cc  INDICATIONS: [name] is a 79 y.o. female who  presents with bleeding left brachiocephalic arteriovenous fistula.  The patient is scheduled for left arm fistulogram.  The patient is aware the risks include but are not limited to: bleeding, infection, thrombosis of the cannulated access, and possible anaphylactic reaction to the contrast.  The patient is aware of the risks of the procedure and elects to proceed forward.  DESCRIPTION: After full informed written consent was obtained, the patient was brought back to the angiography suite and placed supine upon the angiography table.  The patient was connected to monitoring equipment.  The left arm was prepped and draped in the standard fashion for a left arm fistulogram.  Under ultrasound guidance, the left brachiocephalic arteriovenous fistula was cannulated with a micropuncture needle.  The microwire was advanced into the fistula and the needle was exchanged for the a microsheath, which was lodged 2 cm into the access.  The wire was removed and the sheath was connected to the IV extension tubing.  Hand injections were completed to image the access from the antecubitum up to the level of axilla.  The central venous structures were also imaged by hand injections.  Based on the images, this patient will need: no intervention.  In examining the patient's arm, her skin bruises easily and there appears to be minimal subcutaneous tissue over the fistula.   Further investigation of her nutritional status and any underlying condition compromising her skin should be beneficial.   A 4-0 Monocryl purse-string suture was sewn around the sheath.  The sheath was removed while tying down the suture.  A sterile bandage was applied to the puncture site.  COMPLICATIONS: none  CONDITION: stable  Leonides Sake, MD [facility] Office: [phone] Pager: [phone]  07/02/2012 9:16 AM

## 2012-07-02 NOTE — Progress Notes (Signed)
ANTIBIOTIC CONSULT NOTE - Follow-up  Pharmacy Consult for Vanc + Cefepime + Levaquin Indication: Empiric HCAP coverage  Allergies  Allergen Reactions  . Ivp Dye (Iodinated Diagnostic Agents) Swelling  . Lisinopril Swelling  . Iohexol      Desc: itching, swelling     Patient Measurements: Height: 5\' 3"  (160 cm) Weight: 113 lb 6.6 oz (51.444 kg) IBW/kg (Calculated) : 52.4    Vital Signs: Temp: 98.6 F (37 C) (01/30 1015) Temp src: Oral (01/30 1015) BP: 120/47 mmHg (01/30 1015) Pulse Rate: 59  (01/30 1015) Intake/Output from previous day: 01/29 0701 - 01/30 0700 In: 843 [P.O.:840; I.V.:3] Out: -  Intake/Output from this shift:    Labs:  Basename 07/02/12 0945 06/30/12 0610 06/29/12 2103  WBC 9.3 8.7 8.7  HGB 9.2* 9.3* 9.6*  PLT PENDING 128* 129*  LABCREA -- -- --  CREATININE 7.01* 10.60* 10.33*   Estimated Creatinine Clearance: 5.5 ml/min (by C-G formula based on Cr of 7.01). No results found for this basename: VANCOTROUGH:2,VANCOPEAK:2,VANCORANDOM:2,GENTTROUGH:2,GENTPEAK:2,GENTRANDOM:2,TOBRATROUGH:2,TOBRAPEAK:2,TOBRARND:2,AMIKACINPEAK:2,AMIKACINTROU:2,AMIKACIN:2, in the last 72 hours   Microbiology: Recent Results (from the past 720 hour(s))  MRSA PCR SCREENING     Status: Normal   Collection Time   06/30/12  8:46 PM      Component Value Range Status Comment   MRSA by PCR NEGATIVE  NEGATIVE Final    Medications:  Scheduled:     . albuterol  2.5 mg Nebulization Q6H  . antiseptic oral rinse  15 mL Mouth Rinse BID  . aspirin  81 mg Oral Daily  . atorvastatin  80 mg Oral QHS  . calcium acetate  667 mg Oral TID WC  . carvedilol  6.25 mg Oral BID WC  . ceFEPime (MAXIPIME) IV  1 g Intravenous Q24H  . cinacalcet  60 mg Oral Daily  . clopidogrel  75 mg Oral Daily  . darbepoetin (ARANESP) injection - DIALYSIS  100 mcg Intravenous Q Thu-HD  . [COMPLETED] diphenhydrAMINE  25 mg Intravenous Once  . docusate sodium  100 mg Oral BID  . [COMPLETED] famotidine  (PEPCID) IV  20 mg Intravenous Once  . feeding supplement  1 Container Oral BID BM  . folic acid  1 mg Oral Daily  . heparin  5,000 Units Subcutaneous Q8H  . ipratropium  0.5 mg Nebulization Q6H  . isosorbide dinitrate  20 mg Oral TID  . levofloxacin (LEVAQUIN) IV  500 mg Intravenous Q48H  . levothyroxine  150 mcg Oral QAC breakfast  . loratadine  10 mg Oral Daily  . [COMPLETED] methylPREDNISolone (SOLU-MEDROL) injection  100 mg Intravenous Once  . multivitamin  1 tablet Oral QHS  . sodium chloride  3 mL Intravenous Q12H  . vancomycin  500 mg Intravenous Q T,Th,Sa-HD  . [DISCONTINUED] ceFEPime (MAXIPIME) IV  1 g Intravenous Q24H  . [DISCONTINUED] heparin  5,000 Units Subcutaneous Q8H  . [DISCONTINUED] sodium chloride  3 mL Intravenous Q12H   Assessment: 79 y.o. F with ESRD on Vancomycin + Cefepime + Levaquin for r/o HCAP. The patient was loaded with Vancomycin on 1/28 and has received appropriate maintenance doses this admission. The patient is noted to have a poorly functioning LUE AVF -- however has remained on home HD schedule. Will continue to monitor HD duration and tolerance for any necessary dose adjustments.   Goal of Therapy:  Pre-HD Vancomycin level of 15-25 mcg/ml  Plan:  1. Continue Vancomycin 500 mg post HD sessions on TTS 2. Adjust Cefepime to 2g post HD sessions on TTS 3.  Continue Levaquin 500 mg IV every 48 hours 4. Will continue to follow HD schedule/duration, culture results, LOT, and antibiotic de-escalation plans   Georgina Pillion, PharmD, BCPS Clinical Pharmacist Pager: [phone] 07/02/2012 11:01 AM

## 2012-07-02 NOTE — Progress Notes (Signed)
Patient ID: [name]  female  ZOX:[id number]    DOB: 08-22-1932    DOA: 06/29/2012  PCP: Maria Proffer, MD  Assessment/Plan: Principal Problem:  *HCAP (healthcare-associated pneumonia): Appears more like COPD exacerbation with pulmonary edema  - Continue Levaquin and cefepime, patient received vancomycin on 06/30/2012 (hemodialysis patient) - feels a whole better today after nebs  - CXR done today shows no consolidation, consistent with pulmonary edema. Will narrow antibiotics to levaquin only if ok with renal. Place on prednisone   Active Problems:  Coronary artery disease: Currently stable, continue aspirin, Coreg, Lipitor, Plavix   ESRD (end stage renal disease) on dialysis with pulmonary edema:  -Patient had missed dialysis, renal was consulted, underwent hemodialysis on 1/28 per routine - Vascular surgery consulted due to poorly functional left BC AVF, fistulogram done today    SOB (shortness of breath)/  COPD with exacerbation: Also likely secondary to pulmonary edema from missing dialysis -  on scheduled albuterol and Atrovent nebs, O2, Tessalon  Hypothyroidism: Continue Synthroid  DVT Prophylaxis:  Code Status: FC  Disposition:  when medically stable, hopefully tomorrow, if renal can coordinate with her HD   Subjective: Feels a whole lot better today with scheduled Nebs and steroids, No CP, N, V, abd pain  Objective: Weight change: -0.856 kg (-1 lb 14.2 oz)  Intake/Output Summary (Last 24 hours) at 07/02/12 1519 Last data filed at 07/02/12 0450  Gross per 24 hour  Intake    240 ml  Output      0 ml  Net    240 ml   Blood pressure 144/64, pulse 75, temperature 97.7 F (36.5 C), temperature source Oral, resp. rate 20, height 5\' 3"  (1.6 m), weight 52.9 kg (116 lb 10 oz), SpO2 94.00%.  Physical Exam: General: Alert and awake, oriented x3, not in any acute distress. CVS: S1-S2 clear, no murmur rubs or gallops Chest: Bilateral wheezing and rhonchi  improving Abdomen: soft NT, ND, NBS Extremities: no cyanosis, clubbing or edema noted bilaterally   Lab Results: Basic Metabolic Panel:  Lab 07/02/12 8119 06/30/12 0610  NA -- 139  K -- 4.6  CL -- 96  CO2 -- 25  GLUCOSE -- 85  BUN -- 96*  CREATININE 7.01* 10.60*  CALCIUM -- 7.0*  MG -- --  PHOS -- --   CBC:  Lab 07/02/12 0945 06/30/12 0610  WBC 9.3 8.7  NEUTROABS -- --  HGB 9.2* 9.3*  HCT 28.6* 28.9*  MCV 100.0 100.3*  PLT 118* 128*     Micro Results: Recent Results (from the past 240 hour(s))  MRSA PCR SCREENING     Status: Normal   Collection Time   06/30/12  8:46 PM      Component Value Range Status Comment   MRSA by PCR NEGATIVE  NEGATIVE Final     Studies/Results: No results found.  Medications: Scheduled Meds:    . albuterol  2.5 mg Nebulization Q6H  . antiseptic oral rinse  15 mL Mouth Rinse BID  . aspirin  81 mg Oral Daily  . atorvastatin  80 mg Oral QHS  . calcium acetate  667 mg Oral TID WC  . carvedilol  6.25 mg Oral BID WC  . ceFEPime (MAXIPIME) IV  2 g Intravenous Q T,Th,Sat-1800  . cinacalcet  60 mg Oral Daily  . clopidogrel  75 mg Oral Daily  . darbepoetin (ARANESP) injection - DIALYSIS  100 mcg Intravenous Q Thu-HD  . docusate sodium  100 mg Oral BID  .  feeding supplement  1 Container Oral BID BM  . folic acid  1 mg Oral Daily  . heparin  5,000 Units Subcutaneous Q8H  . ipratropium  0.5 mg Nebulization Q6H  . isosorbide dinitrate  20 mg Oral TID  . levofloxacin (LEVAQUIN) IV  500 mg Intravenous Q48H  . levothyroxine  150 mcg Oral QAC breakfast  . loratadine  10 mg Oral Daily  . multivitamin  1 tablet Oral QHS  . sodium chloride  3 mL Intravenous Q12H  . vancomycin  500 mg Intravenous Q T,Th,Sa-HD      LOS: 3 days   [name] M.D. [facility] 07/02/2012, 3:19 PM Pager: [phone]  If 7PM-7AM, please contact night-coverage [email or web address] Password [email or web address]

## 2012-07-02 NOTE — H&P (Signed)
  [facility]  History and Physical Update  The patient was interviewed and re-examined.  The patient's previous History and Physical has been reviewed and is unchanged from Dr. Myra Gianotti consult on: 06/30/12.  There is no change in the plan of care: L arm fistulogram, possible intervention.  Leonides Sake, MD [facility] Office: [phone] Pager: [phone]  07/02/2012, 8:26 AM

## 2012-07-03 MED ORDER — LEVOFLOXACIN 500 MG PO TABS
500.0000 mg | ORAL_TABLET | ORAL | Status: DC
  Administered 2012-07-04 – 2012-07-05 (×2): 500 mg via ORAL
  Filled 2012-07-03 (×2): qty 1

## 2012-07-03 MED ORDER — MENTHOL 3 MG MT LOZG
1.0000 | LOZENGE | OROMUCOSAL | Status: DC | PRN
  Filled 2012-07-03: qty 9

## 2012-07-03 MED ORDER — MAGIC MOUTHWASH
15.0000 mL | Freq: Four times a day (QID) | ORAL | Status: DC
  Administered 2012-07-03 – 2012-07-04 (×3): 15 mL via ORAL
  Filled 2012-07-03 (×6): qty 15

## 2012-07-03 MED ORDER — BUDESONIDE 0.25 MG/2ML IN SUSP
0.2500 mg | Freq: Two times a day (BID) | RESPIRATORY_TRACT | Status: DC
  Administered 2012-07-03 – 2012-07-05 (×4): 0.25 mg via RESPIRATORY_TRACT
  Filled 2012-07-03 (×8): qty 2

## 2012-07-03 NOTE — Progress Notes (Signed)
Pt. Complained of chest pain and nausea,after assessing pt. And getting VS.(BP:136/78,p:78,T:98.1,R:20),MD. Was notified,orders were received.keep monitoring pt. Closely and assessing her needs.

## 2012-07-03 NOTE — Progress Notes (Signed)
ANTIBIOTIC CONSULT NOTE - Follow-up  Pharmacy Consult for Levaquin Indication: Empiric HCAP coverage  Allergies  Allergen Reactions  . Ivp Dye (Iodinated Diagnostic Agents) Swelling  . Lisinopril Swelling  . Iohexol      Desc: itching, swelling     Patient Measurements: Height: 5\' 3"  (160 cm) Weight: 113 lb 1.5 oz (51.3 kg) IBW/kg (Calculated) : 52.4    Vital Signs: Temp: 98.1 F (36.7 C) (01/31 0855) Temp src: Oral (01/31 0855) BP: 136/78 mmHg (01/31 0855) Pulse Rate: 78  (01/31 0855)   Labs:  Basename 07/02/12 1451 07/02/12 0945  WBC -- 9.3  HGB -- 9.2*  PLT -- 118*  LABCREA -- --  CREATININE 7.11* 7.01*   Estimated Creatinine Clearance: 5.4 ml/min (by C-G formula based on Cr of 7.11). No results found for this basename: VANCOTROUGH:2,VANCOPEAK:2,VANCORANDOM:2,GENTTROUGH:2,GENTPEAK:2,GENTRANDOM:2,TOBRATROUGH:2,TOBRAPEAK:2,TOBRARND:2,AMIKACINPEAK:2,AMIKACINTROU:2,AMIKACIN:2, in the last 72 hours   Microbiology: Recent Results (from the past 720 hour(s))  MRSA PCR SCREENING     Status: Normal   Collection Time   06/30/12  8:46 PM      Component Value Range Status Comment   MRSA by PCR NEGATIVE  NEGATIVE Final    Assessment: 79 y.o. F with ESRD with antibiotics narrowed on 07/02/12 to Levaquin monotherapy for continued empiric HCAP coverage. Levaquin dose remains appropriate at this time -- no adjustments are deemed necessary d/t the patient's chronic ESRD. Pharmacy will sign off at this time. Will change from IV to po per P&T policy listed below:  DESCRIPTION: These criteria include:  Patient being treated for a respiratory tract infection, urinary tract infection, or cellulitis  The patient is not neutropenic and does not exhibit a GI malabsorption state  The patient is eating (either orally or via tube) and/or has been taking other orally administered medications for a least 24 hours  The patient is improving clinically and has a Tmax < 100.5  Goal of  Therapy:  Proper antibiotics for infection/cultures adjusted for renal/hepatic function   Plan:  1. Change Levaquin to 500 mg po every 48 hours 2. Pharmacy will sign off at this time as no additional adjustments are deemed necessary due to the patient's chronic kidney disease.   Georgina Pillion, PharmD, BCPS Clinical Pharmacist Pager: [phone] 07/03/2012 9:19 AM

## 2012-07-03 NOTE — Progress Notes (Signed)
Patient ID: [name]  female  YNW:[id number]    DOB: [birth year]    DOA: 06/29/2012  PCP: Galvin Proffer, MD  Assessment/Plan: Principal Problem:  *HCAP (healthcare-associated pneumonia): Appears more like COPD exacerbation with pulmonary edema  - Antibiotics narrowed, continue Levaquin only, patient received vancomycin on 06/30/2012 (hemodialysis patient) and cefepime.  - Continue scheduled nebs, prednisone. Still somewhat wheezy today, add Pulmicort and flutter valve   Active Problems:  Coronary artery disease: Currently stable, continue aspirin, Coreg, Lipitor, Plavix   ESRD (end stage renal disease) on dialysis with pulmonary edema:  -Patient had missed dialysis, renal was consulted, underwent hemodialysis on 1/28, 1/30 per routine - Vascular surgery consulted due to poorly functional left BC AVF, fistulogram done today    SOB (shortness of breath)/  COPD with exacerbation: Also likely secondary to pulmonary edema from missing dialysis -  on scheduled albuterol and Atrovent nebs, O2, Tessalon  Hypothyroidism: Continue Synthroid  DVT Prophylaxis:  Code Status: FC  Disposition:  when medically stable, hopefully tomorrow, HD tomorrow, then she will resume MWF schedule. PT eval today   Subjective: Had CP this morning, no acute changes on the EKG, troponin negative, feels anxiety and nausea today   Objective: Weight change: 1.456 kg (3 lb 3.4 oz)  Intake/Output Summary (Last 24 hours) at 07/03/12 1409 Last data filed at 07/03/12 1342  Gross per 24 hour  Intake    240 ml  Output   1343 ml  Net  -1103 ml   Blood pressure 145/33, pulse 80, temperature 98.2 F (36.8 C), temperature source Oral, resp. rate 16, height 5\' 3"  (1.6 m), weight 51.3 kg (113 lb 1.5 oz), SpO2 100.00%.  Physical Exam: General: Alert and awake, oriented x3, NAD CVS: S1-S2 clear, no murmur rubs or gallops Chest: Bilateral wheezing and rhonchi + Abdomen: soft NT, ND, NBS Extremities: no cyanosis,  clubbing or edema noted bilaterally   Lab Results: Basic Metabolic Panel:  Lab 07/02/12 6578 07/02/12 0945 06/30/12 0610  NA 137 -- 139  K 4.5 -- 4.6  CL 99 -- 96  CO2 23 -- 25  GLUCOSE 194* -- 85  BUN 55* -- 96*  CREATININE 7.11* 7.01* --  CALCIUM 7.2* -- 7.0*  MG -- -- --  PHOS 5.3* -- --   CBC:  Lab 07/02/12 0945 06/30/12 0610  WBC 9.3 8.7  NEUTROABS -- --  HGB 9.2* 9.3*  HCT 28.6* 28.9*  MCV 100.0 100.3*  PLT 118* 128*     Micro Results: Recent Results (from the past 240 hour(s))  MRSA PCR SCREENING     Status: Normal   Collection Time   06/30/12  8:46 PM      Component Value Range Status Comment   MRSA by PCR NEGATIVE  NEGATIVE Final     Studies/Results: No results found.  Medications: Scheduled Meds:    . albuterol  2.5 mg Nebulization QID  . antiseptic oral rinse  15 mL Mouth Rinse BID  . aspirin  81 mg Oral Daily  . atorvastatin  80 mg Oral QHS  . calcium acetate  667 mg Oral TID WC  . carvedilol  6.25 mg Oral BID WC  . cinacalcet  60 mg Oral Daily  . clopidogrel  75 mg Oral Daily  . darbepoetin (ARANESP) injection - DIALYSIS  100 mcg Intravenous Q Thu-HD  . docusate sodium  100 mg Oral BID  . feeding supplement  1 Container Oral BID BM  . folic acid  1 mg  Oral Daily  . heparin  5,000 Units Subcutaneous Q8H  . ipratropium  0.5 mg Nebulization QID  . isosorbide dinitrate  20 mg Oral TID  . levofloxacin  500 mg Oral Q48H  . levothyroxine  137 mcg Oral QAC breakfast  . loratadine  10 mg Oral Daily  . magic mouthwash  15 mL Oral QID  . multivitamin  1 tablet Oral QHS  . predniSONE  40 mg Oral Q breakfast  . sodium chloride  3 mL Intravenous Q12H      LOS: 4 days   [name] M.D. [facility] 07/03/2012, 2:09 PM Pager: [phone]  If 7PM-7AM, please contact night-coverage [email or web address] Password [email or web address]

## 2012-07-03 NOTE — Progress Notes (Signed)
Pt. Call the RN. About small nose bleeding,MD. Was notified,orders to monitor the pt. Closely and informed if it does not resolve ,were given.also to avoid blowing the nose.humidifier was added to the oxygen by the RN.keep monitoring closely and assessing pt's needs.

## 2012-07-03 NOTE — Evaluation (Signed)
Physical Therapy Evaluation Patient Details Name: [name] MRN: [id number] DOB: Jul 25, 1932 Today's Date: 07/03/2012 Time: 5784-6962 PT Time Calculation (min): 30 min  PT Assessment / Plan / Recommendation Clinical Impression  Patient is a 79 yo female admitted with SOB, fluid overload, and recent falls.  Patient with general weakness and decreased balance impacting mobility.  Patient will benefit from acute PT to maximize independence prior to discharge.  Recommend HHPT at discharge - Patient has HH aides 6 hours/day 6 days/week and family assists patient as well.    PT Assessment  Patient needs continued PT services    Follow Up Recommendations  Home health PT;Supervision for mobility/OOB    Does the patient have the potential to tolerate intense rehabilitation      Barriers to Discharge        Equipment Recommendations  None recommended by PT    Recommendations for Other Services     Frequency Min 3X/week    Precautions / Restrictions Precautions Precautions: Fall Precaution Comments: Multiple falls at home - all without her RW Restrictions Weight Bearing Restrictions: No   Pertinent Vitals/Pain       Mobility  Bed Mobility Bed Mobility: Supine to Sit;Sitting - Scoot to Edge of Bed;Sit to Supine Supine to Sit: 4: Min assist;With rails;HOB elevated Sitting - Scoot to Edge of Bed: 4: Min guard Sit to Supine: 4: Min assist;HOB flat Details for Bed Mobility Assistance: Verbal cues for technique.  Assist to raise trunk off of bed to come to sitting due to pain in RUE. Transfers Transfers: Sit to Stand;Stand to Sit Sit to Stand: 4: Min assist;With upper extremity assist;From bed Stand to Sit: 4: Min assist;With upper extremity assist;To bed Details for Transfer Assistance: Verbal cues for hand placement and safety.  Assist for balance - patient leaning posteriorly in standing. Ambulation/Gait Ambulation/Gait Assistance: 4: Min assist Ambulation Distance (Feet): 116  Feet Assistive device: Rolling walker Ambulation/Gait Assistance Details: Verbal cues to stand upright and stay close to RW.  Assist for balance - patient leaning posteriorly during gait. Gait Pattern: Step-through pattern;Decreased stride length;Shuffle;Trunk flexed Gait velocity: Slow gait speed           PT Diagnosis: Difficulty walking;Abnormality of gait;Generalized weakness;Acute pain  PT Problem List: Decreased strength;Decreased activity tolerance;Decreased balance;Decreased mobility;Decreased knowledge of use of DME;Cardiopulmonary status limiting activity;Pain PT Treatment Interventions: DME instruction;Gait training;Functional mobility training;Balance training;Patient/family education   PT Goals Acute Rehab PT Goals PT Goal Formulation: With patient Time For Goal Achievement: 07/10/12 Potential to Achieve Goals: Good Pt will go Supine/Side to Sit: Independently;with HOB 0 degrees PT Goal: Supine/Side to Sit - Progress: Goal set today Pt will go Sit to Supine/Side: Independently;with HOB 0 degrees PT Goal: Sit to Supine/Side - Progress: Goal set today Pt will go Sit to Stand: with supervision;with upper extremity assist PT Goal: Sit to Stand - Progress: Goal set today Pt will Ambulate: 51 - 150 feet;with supervision;with rolling walker PT Goal: Ambulate - Progress: Goal set today  Visit Information  Last PT Received On: 07/03/12 Assistance Needed: +1    Subjective Data  Subjective: "I've got people to help me at home"  "I was walking in the doorway by myself when I fell" Patient Stated Goal: To go home.  To get stronger and be able to go outside.   Prior Functioning  Home Living Lives With: Alone (Aide 6 days/week; 6 hours/day) Available Help at Discharge: Personal care attendant Type of Home: House Home Access: Stairs to enter Entrance  Stairs-Number of Steps: 2 Entrance Stairs-Rails: None Home Layout: One level Bathroom Shower/Tub: Agricultural engineer: Standard Bathroom Accessibility: Yes How Accessible: Accessible via walker Home Adaptive Equipment: Shower chair with back;Bedside commode/3-in-1;Walker - rolling;Walker - four wheeled;Straight cane Additional Comments: Has aide 6 days/week, 6 hours/day from [facility] services Prior Function Level of Independence: Independent with assistive device(s);Needs assistance Needs Assistance: Light Housekeeping;Meal Prep;Bathing;Dressing Bath: Moderate Dressing: Moderate Meal Prep: Maximal Light Housekeeping: Total Able to Take Stairs?: Yes Driving: Yes Vocation: Retired Musician: No difficulties Dominant Hand: Right    Cognition  Overall Cognitive Status: Appears within functional limits for tasks assessed/performed Arousal/Alertness: Awake/alert Orientation Level: Appears intact for tasks assessed Behavior During Session: [facility] for tasks performed    Extremity/Trunk Assessment Right Upper Extremity Assessment RUE ROM/Strength/Tone: WFL for tasks assessed RUE Sensation: WFL - Light Touch Left Upper Extremity Assessment LUE ROM/Strength/Tone: WFL for tasks assessed LUE Sensation: WFL - Light Touch Right Lower Extremity Assessment RLE ROM/Strength/Tone: Deficits RLE ROM/Strength/Tone Deficits: General weakness 4-/5 RLE Sensation: WFL - Light Touch Left Lower Extremity Assessment LLE ROM/Strength/Tone: Deficits LLE ROM/Strength/Tone Deficits: General weakness 4-/5 LLE Sensation: WFL - Light Touch   Balance    End of Session PT - End of Session Equipment Utilized During Treatment: Gait belt Activity Tolerance: Patient limited by fatigue Patient left: in bed;with call bell/phone within reach;with bed alarm set Nurse Communication: Mobility status  GP     Vena Austria 07/03/2012, 8:52 PM Durenda Hurt. Renaldo Fiddler, [facility] Acute Rehab Services Pager [phone]

## 2012-07-03 NOTE — Progress Notes (Signed)
Subjective:  Feels nervous, jittery today, no bowel movement in 3-4 days; breathing well, does not care for the food.  Objective: Vital signs in last 24 hours: Temp:  [97.4 F (36.3 C)-98.4 F (36.9 C)] 98.1 F (36.7 C) (01/31 0855) Pulse Rate:  [75-89] 78  (01/31 0855) Resp:  [18-20] 20  (01/31 0855) BP: (114-164)/(32-78) 136/78 mmHg (01/31 0855) SpO2:  [90 %-100 %] 97 % (01/31 0855) Weight:  [51.3 kg (113 lb 1.5 oz)-52.9 kg (116 lb 10 oz)] 51.3 kg (113 lb 1.5 oz) (01/30 1651) Weight change: 1.456 kg (3 lb 3.4 oz)  Intake/Output from previous day: 01/30 0701 - 01/31 0700 In: 120 [P.O.:120] Out: 1343    EXAM: General appearance:  Alert, in no apparent distress Resp:  Coarseness at left base; otherwise, clear Cardio:  RRR without murmur or rub GI:  + BS, soft and nontender Extremities:  No edema Access:  AVF @ LUA with + bruit  Lab Results:  Basename 07/02/12 0945  WBC 9.3  HGB 9.2*  HCT 28.6*  PLT 118*   BMET:  Basename 07/02/12 1451 07/02/12 0945  NA 137 --  K 4.5 --  CL 99 --  CO2 23 --  GLUCOSE 194* --  BUN 55* --  CREATININE 7.11* 7.01*  CALCIUM 7.2* --  ALBUMIN 2.5* --   No results found for this basename: PTH:2 in the last 72 hours Iron Studies: No results found for this basename: IRON,TIBC,TRANSFERRIN,FERRITIN in the last 72 hours  Dialysis Orders: Center: [facility] on TTS .  EDW 48.5 HD Bath 2.o k ,2.25 ca Time 2hrs 45 min. Heparin= no heparin Access= Left upper arm avf BFR 350 DFR auto flow 1.5  Zemplar 0 mcg IV/HD ipth 111 03/18/12 Epogen 4800 Units IV/HD Venofer 0 tfs 44% 05/20/12  Other body quest ice cream q hd supplement   Assessment/Plan: 1. Dyspnea - improving, chest x-ray 1/30 suggests pulmonary edema with no focal consolidation, now on PO Levaquin. 2. Bilateral knee abrasions - secondary to falls, stable. 3. ESRD - HD on TTS in [place]; K 4.5.  Next HD tomorrow. 4. Dialysis access - widely patent aneurysmal LUA AVF per fistulogram per Dr. Imogene Burn  yesterday. 5. HTN/Volume - BP 136/78 On Coreg 6.25 mg bid; wt 51.3 kg s/p net UF 1.3 L yesterday (EDW 48.5). 6. Anemia - Hgb 9.2 on Aranesp 100 mcg on Thurs. 7. Secondary hyperparathyroidism - Ca 7.2 (8.4 corrected), P 5.3; on Sensipar 60 mg qd, Phoslo 1 with meals.  8. Nutrition - Alb 2.5, renal vitamin, Breeze, high protein renal diet.   LOS: 4 days   LYLES,CHARLES 07/03/2012,10:17 AM  Patient seen and examined.  Agree with assessment and plan as above. Patient keeps signing off early from HD limiting UF.  Plan HD tomorrow with UF 3-4 kg, she is 3kg over today.  Vinson Moselle  MD [facility] [phone] pgr    [phone] cell 07/03/2012, 1:56 PM

## 2012-07-04 ENCOUNTER — Encounter (HOSPITAL_COMMUNITY): Payer: Self-pay | Admitting: Nephrology

## 2012-07-04 DIAGNOSIS — R0789 Other chest pain: Secondary | ICD-10-CM | POA: Diagnosis present

## 2012-07-04 DIAGNOSIS — R072 Precordial pain: Secondary | ICD-10-CM

## 2012-07-04 DIAGNOSIS — J811 Chronic pulmonary edema: Secondary | ICD-10-CM

## 2012-07-04 DIAGNOSIS — I10 Essential (primary) hypertension: Secondary | ICD-10-CM

## 2012-07-04 LAB — RENAL FUNCTION PANEL
BUN: 49 mg/dL — ABNORMAL HIGH (ref 6–23)
CO2: 27 mEq/L (ref 19–32)
Calcium: 8.3 mg/dL — ABNORMAL LOW (ref 8.4–10.5)
Creatinine, Ser: 6.39 mg/dL — ABNORMAL HIGH (ref 0.50–1.10)
Glucose, Bld: 95 mg/dL (ref 70–99)

## 2012-07-04 LAB — CBC
Hemoglobin: 9.1 g/dL — ABNORMAL LOW (ref 12.0–15.0)
MCH: 33 pg (ref 26.0–34.0)
MCV: 101.1 fL — ABNORMAL HIGH (ref 78.0–100.0)
RBC: 2.76 MIL/uL — ABNORMAL LOW (ref 3.87–5.11)

## 2012-07-04 MED ORDER — LIDOCAINE HCL (PF) 1 % IJ SOLN: 5.0000 mL | INTRAMUSCULAR | Status: DC | PRN

## 2012-07-04 MED ORDER — NEPRO/CARBSTEADY PO LIQD
237.0000 mL | ORAL | Status: DC | PRN
  Filled 2012-07-04: qty 237

## 2012-07-04 MED ORDER — ALBUTEROL SULFATE (5 MG/ML) 0.5% IN NEBU
INHALATION_SOLUTION | RESPIRATORY_TRACT | Status: AC
  Filled 2012-07-04: qty 0.5

## 2012-07-04 MED ORDER — ACETAMINOPHEN 325 MG PO TABS
650.0000 mg | ORAL_TABLET | Freq: Four times a day (QID) | ORAL | Status: DC | PRN
  Administered 2012-07-05: 650 mg via ORAL

## 2012-07-04 MED ORDER — HEPARIN SODIUM (PORCINE) 1000 UNIT/ML DIALYSIS: 1000.0000 [IU] | INTRAMUSCULAR | Status: DC | PRN

## 2012-07-04 MED ORDER — PENTAFLUOROPROP-TETRAFLUOROETH EX AERO: 1.0000 "application " | INHALATION_SPRAY | CUTANEOUS | Status: DC | PRN

## 2012-07-04 MED ORDER — GI COCKTAIL ~~LOC~~
30.0000 mL | Freq: Once | ORAL | Status: AC
  Administered 2012-07-04: 30 mL via ORAL
  Filled 2012-07-04: qty 30

## 2012-07-04 MED ORDER — ALTEPLASE 2 MG IJ SOLR: 2.0000 mg | Freq: Once | INTRAMUSCULAR | Status: DC | PRN

## 2012-07-04 MED ORDER — CALCIUM CARBONATE 1250 MG/5ML PO SUSP: 500.0000 mg | Freq: Four times a day (QID) | ORAL | Status: DC | PRN

## 2012-07-04 MED ORDER — SODIUM CHLORIDE 0.9 % IV SOLN: 100.0000 mL | INTRAVENOUS | Status: DC | PRN

## 2012-07-04 MED ORDER — NITROGLYCERIN 0.4 MG SL SUBL
SUBLINGUAL_TABLET | SUBLINGUAL | Status: AC
  Administered 2012-07-04: 0.4 mg via SUBLINGUAL
  Filled 2012-07-04: qty 25

## 2012-07-04 MED ORDER — ONDANSETRON HCL 4 MG PO TABS: 4.0000 mg | ORAL_TABLET | Freq: Four times a day (QID) | ORAL | Status: DC | PRN

## 2012-07-04 MED ORDER — ACETAMINOPHEN 650 MG RE SUPP: 650.0000 mg | Freq: Four times a day (QID) | RECTAL | Status: DC | PRN

## 2012-07-04 MED ORDER — HYDROXYZINE HCL 25 MG PO TABS: 25.0000 mg | ORAL_TABLET | Freq: Three times a day (TID) | ORAL | Status: DC | PRN

## 2012-07-04 MED ORDER — ONDANSETRON HCL 4 MG/2ML IJ SOLN: 4.0000 mg | Freq: Four times a day (QID) | INTRAMUSCULAR | Status: DC | PRN

## 2012-07-04 MED ORDER — LIDOCAINE-PRILOCAINE 2.5-2.5 % EX CREA: 1.0000 "application " | TOPICAL_CREAM | CUTANEOUS | Status: DC | PRN

## 2012-07-04 MED ORDER — ZOLPIDEM TARTRATE 5 MG PO TABS: 5.0000 mg | ORAL_TABLET | Freq: Every evening | ORAL | Status: DC | PRN

## 2012-07-04 MED ORDER — NYSTATIN 100000 UNIT/ML MT SUSP
5.0000 mL | Freq: Four times a day (QID) | OROMUCOSAL | Status: DC
  Administered 2012-07-04 – 2012-07-06 (×7): 500000 [IU] via ORAL
  Filled 2012-07-04 (×16): qty 5

## 2012-07-04 MED ORDER — CAMPHOR-MENTHOL 0.5-0.5 % EX LOTN: 1.0000 "application " | TOPICAL_LOTION | Freq: Three times a day (TID) | CUTANEOUS | Status: DC | PRN

## 2012-07-04 MED ORDER — SORBITOL 70 % SOLN
30.0000 mL | Status: DC | PRN
  Administered 2012-07-04 (×2): 30 mL via ORAL
  Filled 2012-07-04 (×2): qty 30

## 2012-07-04 MED ORDER — NEPRO/CARBSTEADY PO LIQD
237.0000 mL | Freq: Three times a day (TID) | ORAL | Status: DC | PRN
  Administered 2012-07-06: 237 mL via ORAL

## 2012-07-04 MED ORDER — PANTOPRAZOLE SODIUM 40 MG PO TBEC
40.0000 mg | DELAYED_RELEASE_TABLET | Freq: Two times a day (BID) | ORAL | Status: DC
  Administered 2012-07-04 – 2012-07-06 (×5): 40 mg via ORAL
  Filled 2012-07-04 (×2): qty 1

## 2012-07-04 MED ORDER — IPRATROPIUM BROMIDE 0.02 % IN SOLN
RESPIRATORY_TRACT | Status: AC
  Filled 2012-07-04: qty 2.5

## 2012-07-04 MED ORDER — DOCUSATE SODIUM 283 MG RE ENEM: 1.0000 | ENEMA | RECTAL | Status: DC | PRN

## 2012-07-04 NOTE — Progress Notes (Signed)
PT Cancellation Note  Patient Details Name: [name] MRN: [id number] DOB: 1932-06-09   Cancelled Treatment:    Reason Eval/Treat Not Completed: Patient at procedure or test/unavailable;Medical issues which prohibited therapy.  Patient in HD this am.  Had episode of chest pain lasting several hours.  Will hold PT today.  For additional cardiac w/u tomorrow.   Vena Austria 07/04/2012, 2:07 PM [phone]

## 2012-07-04 NOTE — Consult Note (Addendum)
CARDIOLOGY CONSULT NOTE  Patient ID: [name] MRN: [id number] DOB/AGE: 77/24/1940 77 y.o.  Admit date: 06/20/2012  Primary Cardiologist: [place] Reason for Consultation: Chest pain  HPI: 79 yo with history of CAD s/p CABG, CHF, ESRD, and COPD intermittently on home oxygen was admitted with dyspnea/pulmonary edema.  Cardiology is asked to consult for chest pain.  Patient felt weak at home and was being treated by her PCP for PNA.  She missed HD.  She went to the hospital at [facility] with dyspnea/pulmonary edema and was transferred here.  No chest pain initially.  She was dialyzed for volume removal and treated for HCAP/COPD exacerbation.  She continues to be short of breath though better than at admission.  Yesterday morning while in bed, she had central chest pain (aching) for about 4 hours.  It was not severe.  1 set of cardiac enzymes was negative.  Today, she developed the same pain at HD.  She was not hypotensive prior to developing the pain.  Pain lasted 2-3 hours.  Now pain-free.  Initial cardiac enzymes negative.  ECG with v-paced rhythm.  Last cath was in 3/12 with patent grafts.   Review of systems complete and found to be negative unless listed above in HPI  Past Medical History: 1. CAD: CABG 9/08 with SVG-LAD, SVG-PDA, SVG-OM.  LHC 3/12 with patent grafts.  2. CHF: Last echo (2008) with EF 50-55%, septal dyssynergy.  Apparently has since then had an echo with lower EF but report not available.  3. PCM: Symptomatic bradycardia.  She does not know the make.  4. PAD 5. H/o carotid stenosis 6. COPD: Uses home oxygen intermittently 7. ESRD: TTS HD 8. H/o AAA 9. Hyperlipidemia 10. H/o CVA 11. Depression 12. Hypothyroidism 13. Anemia 14. ACEI-related angioedema  FH: CAD  History reviewed. No pertinent family history.  History   Social History  . Marital Status: N/A    Spouse Name: N/A    Number of Children: Has daughter  . Years of Education: N/A    Occupational History  . Not on file.   Social History Main Topics  . Smoking status: Quit smoking about 6 years ago  . Smokeless tobacco: Not on file  . Alcohol Use: No  . Drug Use: No  . Sexually Active:    Other Topics Concern  . Lives in [place]   Social History Narrative  . No narrative on file       . albuterol  2.5 mg Nebulization QID  . albuterol      . antiseptic oral rinse  15 mL Mouth Rinse BID  . aspirin  81 mg Oral Daily  . atorvastatin  80 mg Oral QHS  . budesonide  0.25 mg Nebulization BID  . calcium acetate  667 mg Oral TID WC  . carvedilol  6.25 mg Oral BID WC  . cinacalcet  60 mg Oral Daily  . clopidogrel  75 mg Oral Daily  . darbepoetin (ARANESP) injection - DIALYSIS  100 mcg Intravenous Q Thu-HD  . docusate sodium  100 mg Oral BID  . feeding supplement  1 Container Oral BID BM  . folic acid  1 mg Oral Daily  . heparin  5,000 Units Subcutaneous Q8H  . ipratropium      . ipratropium  0.5 mg Nebulization QID  . isosorbide dinitrate  20 mg Oral TID  . levofloxacin  500 mg Oral Q48H  . levothyroxine  137 mcg Oral QAC breakfast  . loratadine  10 mg Oral Daily  . multivitamin  1 tablet Oral QHS  . nystatin  5 mL Oral QID  . pantoprazole  40 mg Oral BID AC  . predniSONE  40 mg Oral Q breakfast  . sodium chloride  3 mL Intravenous Q12H    Filed Vitals:   07/04/12 0904  BP: 135/49  Pulse: 75  Temp: 97.2 F (36.2 C)  Resp:     General: NAD Neck: No JVD, no thyromegaly or thyroid nodule.  Lungs: Rhonchi bilaterally. CV: Nondisplaced PMI.  Heart regular S1/S2, no S3/S4, 3/6 systolic murmur heard across precordium, may be radiated from fistula.  No peripheral edema.    Abdomen: Soft, nontender, no hepatosplenomegaly, no distention.  Skin: Intact without lesions or rashes.  Neurologic: Alert and oriented x 3.  Psych: Normal affect. Extremities: No clubbing or cyanosis.  HEENT: Normal.   BMET    Component Value Date/Time   NA 140 07/04/2012  0654   K 4.3 07/04/2012 0654   CL 100 07/04/2012 0654   CO2 27 07/04/2012 0654   GLUCOSE 95 07/04/2012 0654   BUN 49* 07/04/2012 0654   CREATININE 6.39* 07/04/2012 0654   CALCIUM 8.3* 07/04/2012 0654   GFRNONAA 6* 07/04/2012 0654   GFRAA 7* 07/04/2012 0654    CBC    Component Value Date/Time   WBC 10.2 07/04/2012 0654   RBC 2.76* 07/04/2012 0654   HGB 9.1* 07/04/2012 0654   HCT 27.9* 07/04/2012 0654   PLT 132* 07/04/2012 0654   MCV 101.1* 07/04/2012 0654   MCH 33.0 07/04/2012 0654   MCHC 32.6 07/04/2012 0654   RDW 16.6* 07/04/2012 0654   LYMPHSABS 1.3 06/29/2009 0405   MONOABS 0.8 06/29/2009 0405   EOSABS 0.1 06/29/2009 0405   BASOSABS 0.0 06/29/2009 0405    Cardiac Panel (last 3 results)  Basename 07/03/12 0930  CKTOTAL --  CKMB --  TROPONINI <0.30  RELINDX --   Radiology: - CXR: Pulmonary edema  EKG: V-paced  ASSESSMENT AND PLAN:   79 yo with history of CAD s/p CABG, CHF, ESRD, and COPD intermittently on home oxygen was admitted with dyspnea/pulmonary edema.  Cardiology is asked to consult for chest pain.  1.  Chest pain: Known CAD s/p CABG.  Last cath in 3/12 with patent grafts.  ECG is v-paced.  Initial cardiac enzymes negative.  Currently chest pain-free.  I will cycle her cardiac enzymes.  If these remain negative, plan for Lexiscan Sestamibi tomorrow morning (or Monday if she cannot be fit in tomorrow).  She should continue ASA, statin, Coreg, isordil.  She is on Plavix now but not at home.  If she has a nonischemic Sestamibi, can likely stop Plavix.  2. Dyspnea: Suspect multifactorial.  I think she has a COPD exaberbation.  She also has a component of CHF/pulmonary based on CXR.  Currently, JVD does not appear elevated.  Continue COPD treatment. 3. CHF: Acute on chronic ? Diastolic CHF.  EF 50-55% in 2008 but notes in chart make allusion to an echo done more recently with lower EF.  Will review today's echo.  Pulmonary edema on CXR but JVP not elevated today after several dialysis sessions.     Signed: [name] 07/04/2012, 1:56 PM

## 2012-07-04 NOTE — Progress Notes (Signed)
Subjective:  Patient reporting CP about one hour into dialysis. No BP drops preceding. Treated with SL NTG x 2 without relief.  Backed off on UF with no relief. Rinsed blood back with CP improving now.  D/W Dr Isidoro Donning- pt had CP yesterday with neg troponins. EKG today with pain showing paced rhythm and wide complex.  No SOB or diaphoresis.  Objective: Vital signs in last 24 hours: Temp:  [97.3 F (36.3 C)-98.2 F (36.8 C)] 97.3 F (36.3 C) (02/01 0641) Pulse Rate:  [68-85] 81  (02/01 0700) Resp:  [12-20] 20  (02/01 0700) BP: (92-160)/(33-78) 156/37 mmHg (02/01 0700) SpO2:  [97 %-100 %] 98 % (02/01 0710) Weight:  [50.1 kg (110 lb 7.2 oz)-51.8 kg (114 lb 3.2 oz)] 50.1 kg (110 lb 7.2 oz) (02/01 0641) Weight change: -1.1 kg (-2 lb 6.8 oz)  Intake/Output from previous day: 01/31 0701 - 02/01 0700 In: 180 [P.O.:180] Out: -    EXAM: Gen: looks uncomfortable, in pain Resp: occ wheezes and coarse rales bilat Cardio:  RRR without murmur or rub GI:  + BS, soft and nontender Extremities:  No edema Access:  AVF @ LUA with + bruit  Lab Results:  Basename 07/04/12 0654 07/02/12 0945  WBC 10.2 9.3  HGB 9.1* 9.2*  HCT 27.9* 28.6*  PLT 132* 118*   BMET:   Basename 07/04/12 0654 07/02/12 1451  NA 140 137  K 4.3 4.5  CL 100 99  CO2 27 23  GLUCOSE 95 194*  BUN 49* 55*  CREATININE 6.39* 7.11*  CALCIUM 8.3* 7.2*  ALBUMIN 2.6* 2.5*   No results found for this basename: PTH:2 in the last 72 hours Iron Studies: No results found for this basename: IRON,TIBC,TRANSFERRIN,FERRITIN in the last 72 hours  Dialysis Orders: Center: [facility] on TTS .  EDW 48.5 HD Bath 2.o k ,2.25 ca Time 2hrs 45 min. Heparin= no heparin Access= Left upper arm avf BFR 350 DFR auto flow 1.5  Zemplar 0 mcg IV/HD ipth 111 03/18/12 Epogen 4800 Units IV/HD Venofer 0 tfs 44% 05/20/12  Other body quest ice cream q hd supplement   Assessment/Plan: 1. Chest pain- Occurred on dialysis but no BP drops or hypoperfusion noted. Did not  respond to SL NTG x2 , improving somewhat with rinsing blood back. Hx of MI's then CABGx3 in 2008.  Discussed with Dr Isidoro Donning, she will assess 2. Dyspnea - multifactorial (edema, copd exac, hcap). Better 3. ESRD, cont tts hd 4. Dialysis access - widely patent aneurysmal LUA AVF per fistulogram per Dr. Imogene Burn 1/30 5. HTN/Volume - Coreg 6.25 mg bid. 1.5kg over dry wt 6. Anemia - Hgb 9.2 on Aranesp 100 mcg on Thurs. 7. Secondary hyperparathyroidism - Ca 7.2 (8.4 corrected), P 5.3; on Sensipar 60 mg qd, Phoslo 1 with meals.  8. Nutrition - Alb 2.5, renal vitamin, Breeze, high protein renal diet.  Vinson Moselle  MD [facility] [phone] pgr    [phone] cell 07/04/2012, 8:36 AM

## 2012-07-04 NOTE — Progress Notes (Signed)
Patient ID: [name]  female  WUJ:[id number]    DOB: 06/13/1932    DOA: 06/29/2012  PCP: Galvin Proffer, MD  Assessment/Plan: Principal Problem:  *HCAP : Appears more like COPD exacerbation with pulmonary edema  - Cont Levaquin only, patient received vancomycin on 06/30/2012 (hemodialysis patient) and cefepime.  - Continue scheduled nebs, prednisone, Pulmicort and flutter valve  - added nystatin s/s and cepacol  Active Problems:  Coronary artery disease with chest pain episodes twice:  - EKG shows paced rhythm, troponin yesterday was negative, currently resolved after morphine, 2 sublingual nitroglycerin and GI cocktail in the hemodialysis  -  continue aspirin, Coreg, Lipitor, Plavix. Follows Dr Kirke Corin [facility] cardiology in [place]), called cardiology consult, patient had a cardiac cath in 2012 which had shown patent grafts, significantly reduced left ventricular systolic function, ECHO on chart is from 2008 with normal EF.   - placed on PPI (on steroids)   ESRD (end stage renal disease) on dialysis with pulmonary edema:  -Patient had missed dialysis, renal was consulted, currently undergoing HD - Vascular surgery consulted due to poorly functional left BC AVF, fistulogram done     SOB (shortness of breath)/  COPD with exacerbation: Also likely secondary to pulmonary edema from missing dialysis -  on scheduled albuterol and Atrovent nebs, O2, Tessalon, will taper prednisone in a.m.  Hypothyroidism: Continue Synthroid  DVT Prophylaxis:  Code Status: FC  Disposition:  when medically stable  Subjective: Had CP again this morning during HD, at the time of my encounter had resolved   Objective: Weight change: -1.1 kg (-2 lb 6.8 oz)  Intake/Output Summary (Last 24 hours) at 07/04/12 1046 Last data filed at 07/04/12 1015  Gross per 24 hour  Intake    183 ml  Output    265 ml  Net    -82 ml   Blood pressure 135/49, pulse 75, temperature 97.2 F (36.2 C), temperature source Oral,  resp. rate 20, height 5\' 3"  (1.6 m), weight 52 kg (114 lb 10.2 oz), SpO2 100.00%.  Physical Exam: General: Alert and awake, oriented x3, NAD CVS: S1-S2 clear, no mrg Chest: Wheezing has improved  Abdomen: soft NT, ND, NBS Extremities: no c/c/e bilaterally   Lab Results: Basic Metabolic Panel:  Lab 07/04/12 9562 07/02/12 1451  NA 140 137  K 4.3 4.5  CL 100 99  CO2 27 23  GLUCOSE 95 194*  BUN 49* 55*  CREATININE 6.39* 7.11*  CALCIUM 8.3* 7.2*  MG -- --  PHOS 4.6 --   CBC:  Lab 07/04/12 0654 07/02/12 0945  WBC 10.2 9.3  NEUTROABS -- --  HGB 9.1* 9.2*  HCT 27.9* 28.6*  MCV 101.1* 100.0  PLT 132* 118*     Micro Results: Recent Results (from the past 240 hour(s))  MRSA PCR SCREENING     Status: Normal   Collection Time   06/30/12  8:46 PM      Component Value Range Status Comment   MRSA by PCR NEGATIVE  NEGATIVE Final     Studies/Results: No results found.  Medications: Scheduled Meds:    . albuterol  2.5 mg Nebulization QID  . albuterol      . antiseptic oral rinse  15 mL Mouth Rinse BID  . aspirin  81 mg Oral Daily  . atorvastatin  80 mg Oral QHS  . budesonide  0.25 mg Nebulization BID  . calcium acetate  667 mg Oral TID WC  . carvedilol  6.25 mg Oral BID WC  .  cinacalcet  60 mg Oral Daily  . clopidogrel  75 mg Oral Daily  . darbepoetin (ARANESP) injection - DIALYSIS  100 mcg Intravenous Q Thu-HD  . docusate sodium  100 mg Oral BID  . feeding supplement  1 Container Oral BID BM  . folic acid  1 mg Oral Daily  . heparin  5,000 Units Subcutaneous Q8H  . ipratropium      . ipratropium  0.5 mg Nebulization QID  . isosorbide dinitrate  20 mg Oral TID  . levofloxacin  500 mg Oral Q48H  . levothyroxine  137 mcg Oral QAC breakfast  . loratadine  10 mg Oral Daily  . magic mouthwash  15 mL Oral QID  . multivitamin  1 tablet Oral QHS  . predniSONE  40 mg Oral Q breakfast  . sodium chloride  3 mL Intravenous Q12H      LOS: 5 days   RAI,RIPUDEEP  M.D. [facility] 07/04/2012, 10:46 AM Pager: [phone]  If 7PM-7AM, please contact night-coverage [email or web address] Password [email or web address]

## 2012-07-04 NOTE — Procedures (Signed)
I was present at this dialysis session. I have reviewed the session itself and made appropriate changes.   Vinson Moselle, MD [facility] 07/04/2012, 8:37 AM

## 2012-07-04 NOTE — Progress Notes (Signed)
Pt c/o sharp pain in midchest 10 of 10 on the pain scale; turned off UF and decreased Blood floor rate to 200 form 300 also increased O2 to 3L. At 0741 BP 160/59 HR 77 R18 and pain scale 8/10 100% O2. Patient given NitroX2 and notified Dr. Odette Horns who ordered to stop tx and ordered an EKG. After rinseback pt @0  807am pt states that the chest pain in subsiding. At 0847am after pt was off dialysis tx pt states her pain is 6/10 on the pain scale; she state "its getting better". Gave report to [place], Charity fundraiser and update her of all issues during tx and the interventions done to help pt and pt was transporated back to her room.

## 2012-07-05 ENCOUNTER — Inpatient Hospital Stay (HOSPITAL_COMMUNITY): Payer: Medicare Other

## 2012-07-05 DIAGNOSIS — I517 Cardiomegaly: Secondary | ICD-10-CM

## 2012-07-05 DIAGNOSIS — R079 Chest pain, unspecified: Secondary | ICD-10-CM

## 2012-07-05 MED ORDER — IPRATROPIUM BROMIDE 0.02 % IN SOLN
0.5000 mg | Freq: Three times a day (TID) | RESPIRATORY_TRACT | Status: DC
  Administered 2012-07-05 – 2012-07-06 (×2): 0.5 mg via RESPIRATORY_TRACT
  Filled 2012-07-05 (×3): qty 2.5

## 2012-07-05 MED ORDER — PREDNISONE 20 MG PO TABS
30.0000 mg | ORAL_TABLET | Freq: Every day | ORAL | Status: DC
  Administered 2012-07-06: 30 mg via ORAL
  Filled 2012-07-05 (×2): qty 1

## 2012-07-05 MED ORDER — TECHNETIUM TC 99M SESTAMIBI GENERIC - CARDIOLITE
10.0000 | Freq: Once | INTRAVENOUS | Status: AC | PRN
  Administered 2012-07-05: 10 via INTRAVENOUS

## 2012-07-05 MED ORDER — REGADENOSON 0.4 MG/5ML IV SOLN
0.4000 mg | Freq: Once | INTRAVENOUS | Status: AC
  Administered 2012-07-05: 0.4 mg via INTRAVENOUS

## 2012-07-05 MED ORDER — TECHNETIUM TC 99M SESTAMIBI GENERIC - CARDIOLITE
30.0000 | Freq: Once | INTRAVENOUS | Status: AC | PRN
  Administered 2012-07-05: 30 via INTRAVENOUS

## 2012-07-05 MED ORDER — ALBUTEROL SULFATE (5 MG/ML) 0.5% IN NEBU
2.5000 mg | INHALATION_SOLUTION | Freq: Three times a day (TID) | RESPIRATORY_TRACT | Status: DC
  Administered 2012-07-05 – 2012-07-06 (×2): 2.5 mg via RESPIRATORY_TRACT
  Filled 2012-07-05 (×2): qty 0.5

## 2012-07-05 NOTE — Progress Notes (Signed)
  Echocardiogram 2D Echocardiogram has been performed.  [name] 07/05/2012, 4:59 PM

## 2012-07-05 NOTE — Progress Notes (Signed)
SUBJECTIVE: The patient is doing well today.  At this time, she denies chest pain.  She has nasal congestion but denies SOB.     Marland Kitchen albuterol  2.5 mg Nebulization QID  . antiseptic oral rinse  15 mL Mouth Rinse BID  . aspirin  81 mg Oral Daily  . atorvastatin  80 mg Oral QHS  . budesonide  0.25 mg Nebulization BID  . calcium acetate  667 mg Oral TID WC  . carvedilol  6.25 mg Oral BID WC  . cinacalcet  60 mg Oral Daily  . clopidogrel  75 mg Oral Daily  . darbepoetin (ARANESP) injection - DIALYSIS  100 mcg Intravenous Q Thu-HD  . docusate sodium  100 mg Oral BID  . feeding supplement  1 Container Oral BID BM  . folic acid  1 mg Oral Daily  . heparin  5,000 Units Subcutaneous Q8H  . ipratropium  0.5 mg Nebulization QID  . isosorbide dinitrate  20 mg Oral TID  . levofloxacin  500 mg Oral Q48H  . levothyroxine  137 mcg Oral QAC breakfast  . loratadine  10 mg Oral Daily  . multivitamin  1 tablet Oral QHS  . nystatin  5 mL Oral QID  . pantoprazole  40 mg Oral BID AC  . predniSONE  40 mg Oral Q breakfast  . sodium chloride  3 mL Intravenous Q12H      OBJECTIVE: Physical Exam: Filed Vitals:   07/04/12 1555 07/04/12 1809 07/04/12 2104 07/05/12 0433  BP:  143/80 157/64 139/50  Pulse:  76 75 76  Temp:  98.2 F (36.8 C) 98.1 F (36.7 C) 97.8 F (36.6 C)  TempSrc:  Oral Oral Oral  Resp:  20 18 18   Height:   5\' 3"  (1.6 m)   Weight:   114 lb 10.2 oz (51.999 kg)   SpO2: 100% 98% 100% 100%    Intake/Output Summary (Last 24 hours) at 07/05/12 0750 Last data filed at 07/05/12 0419  Gross per 24 hour  Intake    446 ml  Output    265 ml  Net    181 ml    Telemetry reveals V pacing  GEN- The patient is elderly appearing, alert and oriented x 3 today.   Head- normocephalic, atraumatic Eyes-  Sclera clear, conjunctiva pink Ears- hearing intact Oropharynx- clear Neck- supple,  Lungs- coarse BS at the bases, normal work of breathing Heart- Regular rate and rhythm, 2/6 SEM  LUSB GI- soft, NT, ND, + BS Extremities- no clubbing, cyanosis, or edema Skin- no rash or lesion Psych- euthymic mood, full affect Neuro- strength and sensation are intact  LABS: Basic Metabolic Panel:  Basename 07/04/12 0654 07/02/12 1451  NA 140 137  K 4.3 4.5  CL 100 99  CO2 27 23  GLUCOSE 95 194*  BUN 49* 55*  CREATININE 6.39* 7.11*  CALCIUM 8.3* 7.2*  MG -- --  PHOS 4.6 5.3*   Liver Function Tests:  Basename 07/04/12 0654 07/02/12 1451  AST -- --  ALT -- --  ALKPHOS -- --  BILITOT -- --  PROT -- --  ALBUMIN 2.6* 2.5*   No results found for this basename: LIPASE:2,AMYLASE:2 in the last 72 hours CBC:  Basename 07/04/12 0654 07/02/12 0945  WBC 10.2 9.3  NEUTROABS -- --  HGB 9.1* 9.2*  HCT 27.9* 28.6*  MCV 101.1* 100.0  PLT 132* 118*   Cardiac Enzymes:  Basename 07/03/12 0930  CKTOTAL --  CKMB --  CKMBINDEX --  TROPONINI <0.30   BNP: No components found with this basename: POCBNP:3 D-Dimer: No results found for this basename: DDIMER:2 in the last 72 hours Hemoglobin A1C: No results found for this basename: HGBA1C in the last 72 hours Fasting Lipid Panel: No results found for this basename: CHOL,HDL,LDLCALC,TRIG,CHOLHDL,LDLDIRECT in the last 72 hours Thyroid Function Tests:  Basename 07/02/12 1755  TSH 1.507  T4TOTAL --  T3FREE --  THYROIDAB --   Anemia Panel: No results found for this basename: VITAMINB12,FOLATE,FERRITIN,TIBC,IRON,RETICCTPCT in the last 72 hours  RADIOLOGY: Dg Chest 2 View  07/02/2012  *RADIOLOGY REPORT*  Clinical Data: This of breath.  Question pneumonia or edema  CHEST - 2 VIEW  Comparison: 06/29/2012  Findings: A dual lead pacer is in place and lead tips projecting over the right atrium and right ventricle are stable in position. The patient is status post median sternotomy and stent placement. Surgical clips are noted in the superior mediastinum.  Cardiomegaly is identified and appears stable in degree.  Aortic ectasia with  aortic calcification is noted.  The lung fields demonstrate coarseness to the interstitial markings diffusely throughout both lung fields with basilar interstitial septal lines, fissural prominence and blunting of the posterior costophrenic angles bilaterally suggesting small pleural effusions. Findings are again most compatible with interstitial edema.  More focal density at the right lung base is felt likely due to localized alveolar edema.  Little interval change has occurred since the most recent exam.  Bony structures are intact.  IMPRESSION: Stable cardiomegaly and findings suggestive of pulmonary edema.  No definite focal consolidation.   Original Report Authenticated By: Rhodia Albright, M.D.     ASSESSMENT AND PLAN:  Principal Problem:  *HCAP (healthcare-associated pneumonia) Active Problems:  Coronary artery disease  ESRD (end stage renal disease) on dialysis  SOB (shortness of breath)  COPD with exacerbation  Pulmonary edema  Chest pain, midsternal  1. Chest pain with known CAD- chest pain is resolved, she feels well, CMs negative Cath 2012 reviewed Would plan medical therapy unless todays Celine Ahr is high risk Would stop plavix is low risk myoview Echo pending  2. HTN Stable No change required today  3. Dyspnea Improving with treatment of COPD Awaiting echo  Hillis Range, MD 07/05/2012 7:50 AM

## 2012-07-05 NOTE — Progress Notes (Signed)
Subjective:  No further chest pain since yesterday, Myoview scheduled this AM; currently breathing well with no complaints, feeling better after bowel movements last night (first in several days).  Objective: Vital signs in last 24 hours: Temp:  [97.2 F (36.2 C)-98.2 F (36.8 C)] 97.8 F (36.6 C) (02/02 0433) Pulse Rate:  [75-77] 76  (02/02 0433) Resp:  [18-23] 18  (02/02 0433) BP: (128-164)/(49-80) 139/50 mmHg (02/02 0433) SpO2:  [98 %-100 %] 100 % (02/02 0433) Weight:  [51.999 kg (114 lb 10.2 oz)-52 kg (114 lb 10.2 oz)] 51.999 kg (114 lb 10.2 oz) (02/01 2104) Weight change: 0.2 kg (7.1 oz)  Intake/Output from previous day: 02/01 0701 - 02/02 0700 In: 446 [P.O.:440; I.V.:6] Out: 265    EXAM: General appearance:  Alert, in no apparent distress Resp:  Mild coarse rales at left base  Cardio:  RRR without murmur or rub GI:  + BS, soft and nontender Extremities:  No edema Access:  AVF @ LUA with + bruit  Lab Results:  Basename 07/04/12 0654 07/02/12 0945  WBC 10.2 9.3  HGB 9.1* 9.2*  HCT 27.9* 28.6*  PLT 132* 118*   BMET:  Basename 07/04/12 0654 07/02/12 1451  NA 140 137  K 4.3 4.5  CL 100 99  CO2 27 23  GLUCOSE 95 194*  BUN 49* 55*  CREATININE 6.39* 7.11*  CALCIUM 8.3* 7.2*  ALBUMIN 2.6* 2.5*   No results found for this basename: PTH:2 in the last 72 hours Iron Studies: No results found for this basename: IRON,TIBC,TRANSFERRIN,FERRITIN in the last 72 hours  Dialysis Orders: Center: [facility] on TTS.  EDW 48.5 HD Bath 2.o k ,2.25 ca Time 2hrs 45 min. Heparin= no heparin Access= Left upper arm avf BFR 350 DFR auto flow 1.5   Zemplar 0 mcg IV/HD ipth 111 03/18/12 Epogen 4800 Units IV/HD Venofer 0 tfs 44% 05/20/12 Other body quest ice cream q hd supplement   Assessment/Plan: 1. Chest pain - yesterday during dialysis, no further occurrence; Hx of MIs with CABG x 3 in 2008.  Myoview scheduled this AM. 2. Dyspnea - multifactorial, improving, chest x-ray 1/30 suggested  pulmonary edema with no focal consolidation, remains on PO Levaquin.   3. ESRD - HD on TTS in [place]; K 4.3. Next HD 2/4. HD cut short yesterday due to chest pain. Myoview today showing no ischemia or infarction, low EF at 37% (last EF ECHO 50% in 2011). Plan HD tomorrow off schedule, UF 3-4kg (that far over EDW). Patient signs off early frequently which makes volume control difficult. 4. Dialysis access - widely patent aneurysmal LUA AVF per fistulogram per Dr. Imogene Burn 1/30.  5. HTN/Volume - BP 139/50 on Coreg 6.25 mg bid; wt 52 kg s/p net UF 265 ml yesterday during shortened Tx (EDW 48.5).  6. Anemia - Hgb 9.1 on Aranesp 100 mcg on Thurs.  7. Secondary hyperparathyroidism - Ca 8.3 (9.4 corrected), P 4.6; on Sensipar 60 mg qd, Phoslo 1 with meals.  8. Nutrition - Alb 2.6, renal vitamin, Breeze, high protein renal diet    LOS: 6 days   LYLES,CHARLES 07/05/2012,7:52 AM

## 2012-07-05 NOTE — Progress Notes (Signed)
Patient ID: [name]  female  [id number]    DOB: 01/15/1933    DOA: 06/29/2012  PCP: Galvin Proffer, MD  Assessment/Plan: Principal Problem:  *HCAP : Appears more like COPD exacerbation with pulmonary edema  - Cont Levaquin only, patient received vancomycin on 06/30/2012 (hemodialysis patient) and cefepime.  - Continue scheduled nebs, prednisone, Pulmicort and flutter valve, PPI  - cont nystatin s/s and cepacol (sore throat better today)  Active Problems:  Coronary artery disease with chest pain episodes twice: Currently resolved -   continue aspirin, Coreg, Lipitor, Plavix. Follows Dr Kirke Corin [facility] cardiology in [place]), patient had a cardiac cath in 2012 which had shown patent grafts, significantly reduced left ventricular systolic function, ECHO on chart is from 2008 with normal EF.   - Appreciate cardiology consultation, stress test Myoview today   ESRD (end stage renal disease) on dialysis with pulmonary edema:  -Patient had missed dialysis, renal was consulted, currently undergoing HD - Vascular surgery consulted due to poorly functional left BC AVF, fistulogram done     SOB (shortness of breath)/  COPD with exacerbation: Improved, likely secondary to pulmonary edema from missing dialysis -  on scheduled albuterol and Atrovent nebs, O2, Tessalon, tapered prednisone   Hypothyroidism: Continue Synthroid  DVT Prophylaxis:  Code Status: FC  Disposition: Hopefully tomorrow, stress test today  Subjective: Patient seen earlier this morning, no chest pain or shortness of breath however was wheezing requested breathing treatment prior to the stress test  Objective: Weight change: 0.2 kg (7.1 oz)  Intake/Output Summary (Last 24 hours) at 07/05/12 1044 Last data filed at 07/05/12 0419  Gross per 24 hour  Intake    443 ml  Output      0 ml  Net    443 ml   Blood pressure 135/48, pulse 78, temperature 98.2 F (36.8 C), temperature source Oral, resp. rate 19, height 5\' 3"  (1.6  m), weight 51.999 kg (114 lb 10.2 oz), SpO2 98.00%.  Physical Exam: General: Alert and awake, oriented x3, NAD CVS: S1-S2 clear, no mrg Chest: Bilateral expiratory wheezing  Abdomen: soft NT, ND, NBS Extremities: no c/c/e bilaterally   Lab Results: Basic Metabolic Panel:  Lab 07/04/12 1308 07/02/12 1451  NA 140 137  K 4.3 4.5  CL 100 99  CO2 27 23  GLUCOSE 95 194*  BUN 49* 55*  CREATININE 6.39* 7.11*  CALCIUM 8.3* 7.2*  MG -- --  PHOS 4.6 --   CBC:  Lab 07/04/12 0654 07/02/12 0945  WBC 10.2 9.3  NEUTROABS -- --  HGB 9.1* 9.2*  HCT 27.9* 28.6*  MCV 101.1* 100.0  PLT 132* 118*     Micro Results: Recent Results (from the past 240 hour(s))  MRSA PCR SCREENING     Status: Normal   Collection Time   06/30/12  8:46 PM      Component Value Range Status Comment   MRSA by PCR NEGATIVE  NEGATIVE Final     Studies/Results: No results found.  Medications: Scheduled Meds:    . albuterol  2.5 mg Nebulization QID  . antiseptic oral rinse  15 mL Mouth Rinse BID  . aspirin  81 mg Oral Daily  . atorvastatin  80 mg Oral QHS  . budesonide  0.25 mg Nebulization BID  . calcium acetate  667 mg Oral TID WC  . carvedilol  6.25 mg Oral BID WC  . cinacalcet  60 mg Oral Daily  . clopidogrel  75 mg Oral Daily  .  darbepoetin (ARANESP) injection - DIALYSIS  100 mcg Intravenous Q Thu-HD  . docusate sodium  100 mg Oral BID  . feeding supplement  1 Container Oral BID BM  . folic acid  1 mg Oral Daily  . heparin  5,000 Units Subcutaneous Q8H  . ipratropium  0.5 mg Nebulization QID  . isosorbide dinitrate  20 mg Oral TID  . levofloxacin  500 mg Oral Q48H  . levothyroxine  137 mcg Oral QAC breakfast  . loratadine  10 mg Oral Daily  . multivitamin  1 tablet Oral QHS  . nystatin  5 mL Oral QID  . pantoprazole  40 mg Oral BID AC  . predniSONE  40 mg Oral Q breakfast  . sodium chloride  3 mL Intravenous Q12H      LOS: 6 days   Carolos Fecher M.D. [facility] 07/05/2012, 10:44 AM Pager: [phone]  If 7PM-7AM, please contact night-coverage [email or web address] Password [email or web address]

## 2012-07-06 DIAGNOSIS — N186 End stage renal disease: Secondary | ICD-10-CM

## 2012-07-06 DIAGNOSIS — Z9119 Patient's noncompliance with other medical treatment and regimen: Secondary | ICD-10-CM

## 2012-07-06 MED ORDER — HEPARIN SODIUM (PORCINE) 1000 UNIT/ML DIALYSIS: 1000.0000 [IU] | INTRAMUSCULAR | Status: DC | PRN

## 2012-07-06 MED ORDER — LIDOCAINE-PRILOCAINE 2.5-2.5 % EX CREA: 1.0000 "application " | TOPICAL_CREAM | CUTANEOUS | Status: DC | PRN

## 2012-07-06 MED ORDER — TEMAZEPAM 7.5 MG PO CAPS
7.5000 mg | ORAL_CAPSULE | Freq: Every evening | ORAL | Status: DC | PRN
Start: 2012-07-06 — End: 2012-07-06

## 2012-07-06 MED ORDER — BENZONATATE 200 MG PO CAPS: 200.0000 mg | ORAL_CAPSULE | Freq: Three times a day (TID) | ORAL | Status: DC | PRN

## 2012-07-06 MED ORDER — FENTANYL 12 MCG/HR TD PT72: 1.0000 | MEDICATED_PATCH | TRANSDERMAL | Status: DC

## 2012-07-06 MED ORDER — SODIUM CHLORIDE 0.9 % IV SOLN: 100.0000 mL | INTRAVENOUS | Status: DC | PRN

## 2012-07-06 MED ORDER — FENTANYL 12 MCG/HR TD PT72
12.5000 ug | MEDICATED_PATCH | TRANSDERMAL | Status: DC
  Administered 2012-07-06: 12.5 ug via TRANSDERMAL
  Filled 2012-07-06: qty 1

## 2012-07-06 MED ORDER — LIDOCAINE HCL (PF) 1 % IJ SOLN: 5.0000 mL | INTRAMUSCULAR | Status: DC | PRN

## 2012-07-06 MED ORDER — TEMAZEPAM 7.5 MG PO CAPS: 7.5000 mg | ORAL_CAPSULE | Freq: Every evening | ORAL | Status: DC | PRN

## 2012-07-06 MED ORDER — DIBUCAINE 1 % RE OINT: 1.0000 "application " | TOPICAL_OINTMENT | RECTAL | Status: DC | PRN

## 2012-07-06 MED ORDER — SENNA 8.6 MG PO TABS
2.0000 | ORAL_TABLET | Freq: Two times a day (BID) | ORAL | Status: DC
  Filled 2012-07-06: qty 2

## 2012-07-06 MED ORDER — BUDESONIDE 0.25 MG/2ML IN SUSP: 0.2500 mg | Freq: Two times a day (BID) | RESPIRATORY_TRACT | Status: DC

## 2012-07-06 MED ORDER — ISOSORBIDE DINITRATE 20 MG PO TABS: 20.0000 mg | ORAL_TABLET | Freq: Three times a day (TID) | ORAL | Status: DC

## 2012-07-06 MED ORDER — NEPRO/CARBSTEADY PO LIQD
237.0000 mL | ORAL | Status: DC | PRN
  Filled 2012-07-06: qty 237

## 2012-07-06 MED ORDER — METHYLNALTREXONE BROMIDE 12 MG/0.6ML ~~LOC~~ SOLN
8.0000 mg | Freq: Every day | SUBCUTANEOUS | Status: DC | PRN
  Filled 2012-07-06: qty 0.6

## 2012-07-06 MED ORDER — CARVEDILOL 12.5 MG PO TABS
12.5000 mg | ORAL_TABLET | Freq: Two times a day (BID) | ORAL | Status: DC
  Administered 2012-07-06: 12.5 mg via ORAL
  Filled 2012-07-06 (×2): qty 1

## 2012-07-06 MED ORDER — LEVOFLOXACIN 500 MG PO TABS: 500.0000 mg | ORAL_TABLET | ORAL | Status: DC

## 2012-07-06 MED ORDER — SENNA 8.6 MG PO TABS: 2.0000 | ORAL_TABLET | Freq: Two times a day (BID) | ORAL | Status: DC

## 2012-07-06 MED ORDER — PREDNISONE (PAK) 10 MG PO TABS: ORAL_TABLET | ORAL | Status: DC

## 2012-07-06 MED ORDER — METHYLNALTREXONE BROMIDE 12 MG/0.6ML ~~LOC~~ SOLN: 8.0000 mg | Freq: Every day | SUBCUTANEOUS | Status: DC | PRN

## 2012-07-06 MED ORDER — PANTOPRAZOLE SODIUM 40 MG PO TBEC: 40.0000 mg | DELAYED_RELEASE_TABLET | Freq: Every day | ORAL | Status: DC

## 2012-07-06 MED ORDER — ALTEPLASE 2 MG IJ SOLR: 2.0000 mg | Freq: Once | INTRAMUSCULAR | Status: DC | PRN

## 2012-07-06 MED ORDER — CARVEDILOL 12.5 MG PO TABS: 12.5000 mg | ORAL_TABLET | Freq: Two times a day (BID) | ORAL | Status: DC

## 2012-07-06 MED ORDER — NYSTATIN 100000 UNIT/ML MT SUSP: 5.0000 mL | Freq: Four times a day (QID) | OROMUCOSAL | Status: DC

## 2012-07-06 MED ORDER — ALBUTEROL SULFATE (5 MG/ML) 0.5% IN NEBU: 2.5000 mg | INHALATION_SOLUTION | Freq: Three times a day (TID) | RESPIRATORY_TRACT | Status: DC

## 2012-07-06 MED ORDER — DIBUCAINE 1 % RE OINT
TOPICAL_OINTMENT | RECTAL | Status: DC | PRN
  Filled 2012-07-06: qty 28

## 2012-07-06 MED ORDER — PENTAFLUOROPROP-TETRAFLUOROETH EX AERO: 1.0000 "application " | INHALATION_SPRAY | CUTANEOUS | Status: DC | PRN

## 2012-07-06 NOTE — Discharge Summary (Signed)
Physician Discharge Summary  Patient ID: [name] MRN: [id number] DOB/AGE: 77-08-1932 77 y.o.  Admit date: 06/29/2012 Discharge date: 07/06/2012  Primary Care Physician:  Galvin Proffer, MD  Discharge Diagnoses:    . ESRD (end stage renal disease) on dialysis . SOB (shortness of breath) . Coronary artery disease . HCAP (healthcare-associated pneumonia) . COPD with exacerbation . Pulmonary edema . Chest pain, midsternal  Consults:  Nephrology                    Palliative medicine   Discharge Medications:   Medication List     As of 07/06/2012  3:38 PM    STOP taking these medications         Eszopiclone 3 MG Tabs      predniSONE 20 MG tablet   Commonly known as: DELTASONE      TAKE these medications         albuterol (5 MG/ML) 0.5% nebulizer solution   Commonly known as: PROVENTIL   Take 0.5 mLs (2.5 mg total) by nebulization 3 (three) times daily. AND EVERY 2 HOURS AS NEEDED For shortness of breath      ALPRAZolam 0.25 MG tablet   Commonly known as: XANAX   Take 0.25 mg by mouth 2 (two) times daily as needed. For anxiety      aspirin 81 MG chewable tablet   Chew 81 mg by mouth daily.      atorvastatin 80 MG tablet   Commonly known as: LIPITOR   Take 80 mg by mouth at bedtime.      benzonatate 200 MG capsule   Commonly known as: TESSALON   Take 1 capsule (200 mg total) by mouth 3 (three) times daily as needed for cough. For cough      budesonide 0.25 MG/2ML nebulizer solution   Commonly known as: PULMICORT   Take 2 mLs (0.25 mg total) by nebulization 2 (two) times daily.      carvedilol 12.5 MG tablet   Commonly known as: COREG   Take 1 tablet (12.5 mg total) by mouth 2 (two) times daily with a meal.      cinacalcet 60 MG tablet   Commonly known as: SENSIPAR   Take 60 mg by mouth daily.      dibucaine 1 % Oint   Commonly known as: NUPERCAINAL   Place 1 application rectally as needed.      docusate sodium 100 MG capsule   Commonly known as:  COLACE   Take 100 mg by mouth 2 (two) times daily.      fentaNYL 12 MCG/HR   Commonly known as: DURAGESIC - dosed mcg/hr   Place 1 patch (12.5 mcg total) onto the skin every 3 (three) days.      fluticasone 50 MCG/ACT nasal spray   Commonly known as: FLONASE   Place 2 sprays into the nose daily as needed. For allergies      folic acid 1 MG tablet   Commonly known as: FOLVITE   Take 1 mg by mouth daily.      furosemide 80 MG tablet   Commonly known as: LASIX   Take 80 mg by mouth 4 (four) times daily.      guaiFENesin-codeine 100-10 MG/5ML syrup   Commonly known as: ROBITUSSIN AC   Take 5 mLs by mouth 4 (four) times daily as needed. For cough      isosorbide dinitrate 20 MG tablet   Commonly known as: ISORDIL  Take 1 tablet (20 mg total) by mouth 3 (three) times daily.      levofloxacin 500 MG tablet   Commonly known as: LEVAQUIN   Take 1 tablet (500 mg total) by mouth every other day. X 3 MORE DOSES      levothyroxine 137 MCG tablet   Commonly known as: SYNTHROID, LEVOTHROID   Take 137 mcg by mouth daily.      loratadine 10 MG tablet   Commonly known as: CLARITIN   Take 10 mg by mouth daily.      megestrol 400 MG/10ML suspension   Commonly known as: MEGACE   Take 600 mg by mouth daily.      methylnaltrexone 12 MG/0.6ML Soln   Commonly known as: RELISTOR   Inject 0.4 mLs (8 mg total) into the skin daily as needed (after failure of oral medications to achieve daily BM).      multivitamin Tabs tablet   Take 1 tablet by mouth daily.      nitroGLYCERIN 0.4 MG SL tablet   Commonly known as: NITROSTAT   Place 0.4 mg under the tongue every 5 (five) minutes as needed. For chest pain      nystatin 100000 UNIT/ML suspension   Commonly known as: MYCOSTATIN   Take 5 mLs (500,000 Units total) by mouth 4 (four) times daily.      olopatadine 0.1 % ophthalmic solution   Commonly known as: PATANOL   Place 1 drop into both eyes daily as needed. For dry eyes      oxyCODONE  5 MG immediate release tablet   Commonly known as: Oxy IR/ROXICODONE   Take 5 mg by mouth every 4 (four) hours as needed. For pain      oxyCODONE-acetaminophen 7.5-500 MG per tablet   Commonly known as: PERCOCET   Take 1 tablet by mouth every 8 (eight) hours as needed. For pain      pantoprazole 40 MG tablet   Commonly known as: PROTONIX   Take 1 tablet (40 mg total) by mouth daily.      PHOSLO 667 MG capsule   Generic drug: calcium acetate   Take 1,334-3,335 mg by mouth 3 (three) times daily with meals. 5 capsules with meals, 2 capsules with snacks      predniSONE 10 MG tablet   Commonly known as: STERAPRED UNI-PAK   Prednisone dosing: Take  Prednisone 30mg  (3 tabs) x 3 days, then 20mg  (2 tabs) x 3days, then 10mg  (1 tab) x 3days, then OFF.      promethazine 25 MG tablet   Commonly known as: PHENERGAN   Take 25 mg by mouth every 6 (six) hours as needed. For nausea      senna 8.6 MG Tabs   Commonly known as: SENOKOT   Take 2 tablets (17.2 mg total) by mouth 2 (two) times daily.      temazepam 7.5 MG capsule   Commonly known as: RESTORIL   Take 1 capsule (7.5 mg total) by mouth at bedtime as needed for sleep.      tiotropium 18 MCG inhalation capsule   Commonly known as: SPIRIVA   Place 1 capsule (18 mcg total) into inhaler and inhale daily.          Brief H and P: For complete details please refer to admission H and P, but in brief 79 year-old female with history of ESRD on hemodialysis gets dialyzed on Tuesday Thursday and Saturday had missed her dialysis on Saturday due to weakness.  Patient was recently started on antibiotics by her PCP for possible pneumonia. Patient had been on antibiotics for last few days. Since she became more short of breath last 2 days, she had gone to [facility] and over there patient was found to be in fluid overload but not in respiratory distress. Chest x-ray showed pulmonary congestion compatible with fluid overload. Patient was  transferred to [facility] for further management, as patient may require dialysis. Patient denied any chest pain fever chills but had productive cough.   Hospital Course:   *HCAP : Appears more like COPD exacerbation with pulmonary edema  - Cont Levaquin only x3 doses, patient received vancomycin on 06/30/2012 (hemodialysis patient) and cefepime. Patient was placed on scheduled nebs, prednisone, Pulmicort and flutter valve, PPI. She can continue nystatin s/s and cepacol.   Coronary artery disease with chest pain episodes twice: Resolved  Patient was continued on aspirin, Coreg, Lipitor, Plavix. Follows Dr Kirke Corin [facility] cardiology in [place]), patient had a cardiac cath in 2012 which had shown patent grafts, significantly reduced left ventricular systolic function, ECHO on chart is from 2008 with normal EF. Cardiology was consulted and patient underwent stress test Myoview on 07/05/2012 which showed no evidence of prior infarction or reversible ischemia, EF was 32%. 2-D echo was obtained and patient did have moderately to severely reduced systolic function with EF of 30-35%.   ESRD (end stage renal disease) on dialysis with pulmonary edema: Patient had missed dialysis, renal was consulted and patient underwent hemodialysis per her schedule. Vascular surgery was consulted due to poorly functional left BC AVF, fistulogram was done on 07/02/2012. Patient had been signing off only from hemodialysis outpatient and she repeated the same behavior inpatient as well. Palliative medicine consult was obtained for establishing goals of care for understanding her reasons and the implications of withdrawing from hemodialysis. The patient was seen by Dr. Phillips Odor and recommended starting low dose of fentanyl patch and help with insomnia, anxiety and depression which will hopefully help to complete  Hemodialysis.  SOB (shortness of breath)/ COPD with exacerbation: Improved, likely secondary to pulmonary edema from missing  dialysis  She was placed on scheduled albuterol and Atrovent nebs, O2, Tessalon, tapered prednisone  Hypothyroidism: Continue Synthroid  Day of Discharge BP 113/41  Pulse 89  Temp 98.3 F (36.8 C) (Oral)  Resp 16  Ht 5\' 3"  (1.6 m)  Wt 48.8 kg (107 lb 9.4 oz)  BMI 19.06 kg/m2  SpO2 99%  Physical Exam:  General: Axox3, NAD  CVS: S1-S2 clear, no mrg  Chest: Bilateral expiratory wheezing improved  Abdomen: soft NT, ND, NBS  Extremities: no c/c/e bilaterally   The results of significant diagnostics from this hospitalization (including imaging, microbiology, ancillary and laboratory) are listed below for reference.    LAB RESULTS: Basic Metabolic Panel:  Lab 07/04/12 4540 07/02/12 1451  NA 140 137  K 4.3 4.5  CL 100 99  CO2 27 23  GLUCOSE 95 194*  BUN 49* 55*  CREATININE 6.39* 7.11*  CALCIUM 8.3* 7.2*  MG -- --  PHOS 4.6 --   Liver Function Tests:  Lab 07/04/12 0654 07/02/12 1451  AST -- --  ALT -- --  ALKPHOS -- --  BILITOT -- --  PROT -- --  ALBUMIN 2.6* 2.5*   CBC:  Lab 07/04/12 0654 07/02/12 0945  WBC 10.2 9.3  NEUTROABS -- --  HGB 9.1* 9.2*  HCT 27.9* 28.6*  MCV 101.1* --  PLT 132* 118*   Cardiac Enzymes:  Lab 07/03/12 0930  CKTOTAL --  CKMB --  CKMBINDEX --  TROPONINI <0.30    Significant Diagnostic Studies:  No results found.   Disposition and Follow-up: Discharge Orders    Future Orders Please Complete By Expires   Increase activity slowly      Discharge instructions      Comments:   Discharge diet: renal diet       DISPOSITION: home  DIET: renal diet  ACTIVITY: as tolerated   DISCHARGE FOLLOW-UP Follow-up Information    Follow up with HAGUE, Myrene Galas, MD. Schedule an appointment as soon as possible for a visit in 10 days. (for hospital follow-up)    Contact information:   [address] [place] [address] [phone]          Time spent on Discharge: 45 mins  Signed:   Kenshawn Maciolek M.D. [facility] 07/06/2012, 3:38 PM Pager: [phone]

## 2012-07-06 NOTE — Progress Notes (Signed)
Pt demanded to discontinue dialysis tx 1hr and 23 mins.  Patient threaten to pull her dialysis needles out.  Dr. Darrick Penna notified of pt behavior.  Order received to discontinue tx for pt.  Order followed thru as ordered.

## 2012-07-06 NOTE — Procedures (Signed)
I was present at this session.  I have reviewed the session itself and made appropriate changes.  BP ^, ^ goal  [name] L 2/3/20148:40 AM

## 2012-07-06 NOTE — Progress Notes (Signed)
Patient ID: [name]  female  [id number]    DOB: [birth year]    DOA: 06/29/2012  PCP: Galvin Proffer, MD  Assessment/Plan: Principal Problem:  *HCAP : Appears more like COPD exacerbation with pulmonary edema  - Cont Levaquin only x3 doses, patient received vancomycin on 06/30/2012 (hemodialysis patient) and cefepime. Patient was placed on scheduled nebs, prednisone, Pulmicort and flutter valve, PPI. She can continue nystatin s/s and cepacol.   Coronary artery disease with chest pain episodes twice: Resolved Patient was continued on aspirin, Coreg, Lipitor, Plavix. Follows Dr Kirke Corin [facility] cardiology in [place]), patient had a cardiac cath in 2012 which had shown patent grafts, significantly reduced left ventricular systolic function, ECHO on chart is from 2008 with normal EF.  Cardiology was consulted and patient underwent stress test Myoview on 07/05/2012 which showed no evidence of prior infarction or reversible ischemia, EF was 32%. 2-D echo was obtained and patient did have moderately to severely reduced systolic function with EF of 30-35%.    ESRD (end stage renal disease) on dialysis with pulmonary edema: Patient had missed dialysis, renal was consulted and patient underwent hemodialysis per her schedule. Vascular surgery was consulted due to poorly functional left BC AVF, fistulogram was done on 07/02/2012. Patient had been signing off only from hemodialysis outpatient and she repeated the same behavior inpatient as well. Palliative medicine consult was obtained for establishing goals of care for understanding her  reasons and the implications of withdrawing from hemodialysis.    SOB (shortness of breath)/  COPD with exacerbation: Improved, likely secondary to pulmonary edema from missing dialysis She was placed on scheduled albuterol and Atrovent nebs, O2, Tessalon, tapered prednisone   Hypothyroidism: Continue Synthroid  DVT Prophylaxis:  Code Status: FC  Disposition: awaiting  palliative medicine GOC  Subjective: Patient seen earlier this morning during hemodialysis, no specific complaints wants to go home  Objective: Weight change: -0.001 kg (-0 oz)  Intake/Output Summary (Last 24 hours) at 07/06/12 1432 Last data filed at 07/06/12 1300  Gross per 24 hour  Intake    720 ml  Output   2475 ml  Net  -1755 ml   Blood pressure 165/26, pulse 76, temperature 98.3 F (36.8 C), temperature source Oral, resp. rate 16, height 5\' 3"  (1.6 m), weight 48.8 kg (107 lb 9.4 oz), SpO2 100.00%.  Physical Exam: General: Axox3, NAD CVS: S1-S2 clear, no mrg Chest: Bilateral expiratory wheezing improved Abdomen: soft NT, ND, NBS Extremities: no c/c/e bilaterally   Lab Results: Basic Metabolic Panel:  Lab 07/04/12 5784 07/02/12 1451  NA 140 137  K 4.3 4.5  CL 100 99  CO2 27 23  GLUCOSE 95 194*  BUN 49* 55*  CREATININE 6.39* 7.11*  CALCIUM 8.3* 7.2*  MG -- --  PHOS 4.6 --   CBC:  Lab 07/04/12 0654 07/02/12 0945  WBC 10.2 9.3  NEUTROABS -- --  HGB 9.1* 9.2*  HCT 27.9* 28.6*  MCV 101.1* 100.0  PLT 132* 118*     Micro Results: Recent Results (from the past 240 hour(s))  MRSA PCR SCREENING     Status: Normal   Collection Time   06/30/12  8:46 PM      Component Value Range Status Comment   MRSA by PCR NEGATIVE  NEGATIVE Final     Studies/Results: No results found.  Medications: Scheduled Meds:    . albuterol  2.5 mg Nebulization TID  . antiseptic oral rinse  15 mL Mouth Rinse BID  . aspirin  81 mg Oral Daily  . atorvastatin  80 mg Oral QHS  . budesonide  0.25 mg Nebulization BID  . calcium acetate  667 mg Oral TID WC  . carvedilol  12.5 mg Oral BID WC  . cinacalcet  60 mg Oral Daily  . darbepoetin (ARANESP) injection - DIALYSIS  100 mcg Intravenous Q Thu-HD  . docusate sodium  100 mg Oral BID  . feeding supplement  1 Container Oral BID BM  . folic acid  1 mg Oral Daily  . heparin  5,000 Units Subcutaneous Q8H  . ipratropium  0.5 mg  Nebulization TID  . isosorbide dinitrate  20 mg Oral TID  . levofloxacin  500 mg Oral Q48H  . levothyroxine  137 mcg Oral QAC breakfast  . loratadine  10 mg Oral Daily  . multivitamin  1 tablet Oral QHS  . nystatin  5 mL Oral QID  . pantoprazole  40 mg Oral BID AC  . predniSONE  30 mg Oral Q breakfast  . sodium chloride  3 mL Intravenous Q12H      LOS: 7 days   [name] M.D. [facility] 07/06/2012, 2:32 PM Pager: [phone]  If 7PM-7AM, please contact night-coverage [email or web address] Password [email or web address]

## 2012-07-06 NOTE — Progress Notes (Signed)
Subjective: Interval History: none.  Objective: Vital signs in last 24 hours: Temp:  [97.4 F (36.3 C)-98.2 F (36.8 C)] 98.2 F (36.8 C) (02/03 0716) Pulse Rate:  [75-88] 75  (02/03 0830) Resp:  [10-20] 11  (02/03 0830) BP: (107-212)/(40-83) 212/69 mmHg (02/03 0830) SpO2:  [97 %-100 %] 99 % (02/03 0716) Weight:  [51.3 kg (113 lb 1.5 oz)-51.999 kg (114 lb 10.2 oz)] 51.3 kg (113 lb 1.5 oz) (02/03 0716) Weight change: -0.001 kg (-0 oz)  Intake/Output from previous day: 02/02 0701 - 02/03 0700 In: 480 [P.O.:480] Out: 0  Intake/Output this shift:    General appearance: cooperative and pale Back:  Breasts:  GI: obese pos bs, liver down 4 cm Extremities: avf LUA CV reg gr2/6, SEM, Gr 2/6 dia M Resp rales bibas, decreased bs,  Lab Results:  [facility] 07/04/12 0654  WBC 10.2  HGB 9.1*  HCT 27.9*  PLT 132*   BMET:  Basename 07/04/12 0654  NA 140  K 4.3  CL 100  CO2 27  GLUCOSE 95  BUN 49*  CREATININE 6.39*  CALCIUM 8.3*   No results found for this basename: PTH:2 in the last 72 hours Iron Studies: No results found for this basename: IRON,TIBC,TRANSFERRIN,FERRITIN in the last 72 hours  Studies/Results: Nm Myocar Multi W/spect W/wall Motion / Ef  07/05/2012  *RADIOLOGY REPORT*  Clinical Data:  Chest pain  MYOCARDIAL IMAGING WITH SPECT (REST AND PHARMACOLOGIC-STRESS) GATED LEFT VENTRICULAR WALL MOTION STUDY LEFT VENTRICULAR EJECTION FRACTION  Technique:  Standard myocardial SPECT imaging was performed after resting intravenous injection of 10 mCi Tc-37m sestamibi. Subsequently, intravenous infusion of lexiscan was performed under the supervision of the Cardiology staff.  At peak effect of the drug, 30 mCi Tc-2m sestamibi was injected intravenously and standard myocardial SPECT  imaging was performed.  Quantitative gated imaging was also performed to evaluate left ventricular wall motion, and estimate left ventricular ejection fraction.  Comparison:  Chest radiograph -  07/02/2012  Findings:  Review of the rotational raw images demonstrates mild attenuation secondary to the left arm being placed down by the patient's size. No significant patient motion artifact.  There is a minimal amount of extravasated radiotracer within the right arm.  SPECT imaging demonstrates marked dilatation of the left ventricular cavity.  There is mild attenuation involving the anterior wall and to a lesser extent, the inferior wall, which improves on the provided stress images.  No scintigraphic evidence of prior infarction or pharmacologically induced ischemia.  Quantitative gated analysis shows dyskinesia involving the septum and inferior wall.  The apex is noted to be akinetic.  The resting left ventricular ejection fraction is 32% with end- diastolic volume of 177 ml and end-systolic volume of 121 ml.  IMPRESSION: 1.  No scintigraphic evidence of prior infarction or pharmacologically induced ischemia. 2.  Dilated left ventricle with dyskinesia of the septum and inferior wall and akinesia involving the left ventricular apex. Ejection fraction - 32%.   Original Report Authenticated By: Tacey Ruiz, MD     I have reviewed the patient's current medications.  Assessment/Plan: 1 ESRD vol xs, lower at HD, needs longer UFR  S/os a prob 2 Anemia epo 3 COPD 4 CAD 5 HTN lower vol and longer UFR P HD epo, longer UFR   LOS: 7 days   Jaymian Bogart L 07/06/2012,8:45 AM

## 2012-07-06 NOTE — Progress Notes (Signed)
Physical Therapy Treatment Patient Details Name: [name] MRN: [id number] DOB: Jun 29, 1932 Today's Date: 07/06/2012 Time: 7829-5621 PT Time Calculation (min): 18 min  PT Assessment / Plan / Recommendation Comments on Treatment Session  Improving activity tolerance and safety with walker and balance despite patient report she didn't walk over the weekend.  Feel family and caregivers can manage patient safely at home.    Follow Up Recommendations  Home health PT;Supervision for mobility/OOB           Equipment Recommendations  None recommended by PT       Frequency Min 3X/week   Plan Discharge plan remains appropriate    Precautions / Restrictions Precautions Precautions: Fall   Pertinent Vitals/Pain C/o LE pain     Mobility  Bed Mobility Bed Mobility: Scooting to HOB Supine to Sit: 5: Supervision;HOB elevated Sitting - Scoot to Edge of Bed: 5: Supervision;With rail Sit to Supine: 5: Supervision Scooting to HOB: 1: +2 Total assist Scooting to [facility]: Patient Percentage: 60% Details for Bed Mobility Assistance: scooting to head of bed with head of bed elevated Transfers Sit to Stand: 5: Supervision;From bed;Without upper extremity assist Stand to Sit: To bed;4: Min guard Details for Transfer Assistance: cues for assist Ambulation/Gait Ambulation/Gait Assistance: 4: Min guard Ambulation Distance (Feet): 200 Feet Assistive device: Rolling walker Ambulation/Gait Assistance Details: cues for posture and proximity to walker Gait Pattern: Step-to pattern;Decreased stride length    Exercises General Exercises - Lower Extremity Heel Slides: Standing;10 reps (hamstring curls) Hip Flexion/Marching: AROM;Both;10 reps;Standing Heel Raises: AROM;Both;10 reps;Standing Mini-Sqauts: AROM;Both;10 reps;Standing    PT Goals Acute Rehab PT Goals Pt will go Supine/Side to Sit: Independently;with HOB 0 degrees PT Goal: Supine/Side to Sit - Progress: Progressing toward goal Pt will go  Sit to Supine/Side: Independently;with HOB 0 degrees PT Goal: Sit to Supine/Side - Progress: Progressing toward goal Pt will go Sit to Stand: with supervision;without upper extremity assist PT Goal: Sit to Stand - Progress: Progressing toward goal Pt will Ambulate: 51 - 150 feet;with rolling walker;with supervision PT Goal: Ambulate - Progress: Progressing toward goal  Visit Information  Last PT Received On: 07/06/12    Subjective Data  Subjective: I wasn't using a walker before, but I will now.   Cognition  Cognition Overall Cognitive Status: Appears within functional limits for tasks assessed/performed Arousal/Alertness: Awake/alert Orientation Level: Appears intact for tasks assessed Behavior During Session: [facility] for tasks performed    Balance  High Level Balance High Level Balance Activites: Side stepping High Level Balance Comments: in hall with UE assist on rail  End of Session PT - End of Session Equipment Utilized During Treatment: Gait belt Activity Tolerance: Patient tolerated treatment well Patient left: in bed;with call bell/phone within reach;with family/visitor present   GP     [facility] 07/06/2012, 3:39 PM Sheran Lawless, PT [phone] 07/06/2012

## 2012-07-06 NOTE — Progress Notes (Signed)
Pt complained of Chest Pain. Given 1 tab Sl nitro and oxygen was put on pt.  Pt also said she felt anxious. Given Alanson Aly as well. She rated pain level at a 3. Pt stated she felt better after . Dr Conley Rolls notified . Will continue to monitor.

## 2012-07-06 NOTE — Progress Notes (Signed)
Patient ID: [name], female   DOB: [birth year], 79 y.o.   MRN: [id number]    SUBJECTIVE: Mild chest pain when she woke up today, thinks it was because she was anxious.  Doing fine now.  Stress test as below: no evidence for ischemia but decreased EF.       [name] albuterol  2.5 mg Nebulization TID  . antiseptic oral rinse  15 mL Mouth Rinse BID  . aspirin  81 mg Oral Daily  . atorvastatin  80 mg Oral QHS  . budesonide  0.25 mg Nebulization BID  . calcium acetate  667 mg Oral TID WC  . carvedilol  12.5 mg Oral BID WC  . cinacalcet  60 mg Oral Daily  . clopidogrel  75 mg Oral Daily  . darbepoetin (ARANESP) injection - DIALYSIS  100 mcg Intravenous Q Thu-HD  . docusate sodium  100 mg Oral BID  . feeding supplement  1 Container Oral BID BM  . folic acid  1 mg Oral Daily  . heparin  5,000 Units Subcutaneous Q8H  . ipratropium  0.5 mg Nebulization TID  . isosorbide dinitrate  20 mg Oral TID  . levofloxacin  500 mg Oral Q48H  . levothyroxine  137 mcg Oral QAC breakfast  . loratadine  10 mg Oral Daily  . multivitamin  1 tablet Oral QHS  . nystatin  5 mL Oral QID  . pantoprazole  40 mg Oral BID AC  . predniSONE  30 mg Oral Q breakfast  . sodium chloride  3 mL Intravenous Q12H      Filed Vitals:   07/06/12 0444 07/06/12 0716 07/06/12 0721 07/06/12 0730  BP: 170/71 184/62 192/62 202/83  Pulse: 77 79 75 76  Temp: 97.4 F (36.3 C) 98.2 F (36.8 C)    TempSrc: Oral Oral    Resp: 18 10 11 14   Height:      Weight:  113 lb 1.5 oz (51.3 kg)    SpO2: 100% 99%      Intake/Output Summary (Last 24 hours) at 07/06/12 0803 Last data filed at 07/06/12 0600  Gross per 24 hour  Intake    480 ml  Output      0 ml  Net    480 ml    LABS: Basic Metabolic Panel:  Basename 07/04/12 0654  NA 140  K 4.3  CL 100  CO2 27  GLUCOSE 95  BUN 49*  CREATININE 6.39*  CALCIUM 8.3*  MG --  PHOS 4.6   Liver Function Tests:  Basename 07/04/12 0654  AST --  ALT --  ALKPHOS --  BILITOT --    PROT --  ALBUMIN 2.6*   No results found for this basename: LIPASE:2,AMYLASE:2 in the last 72 hours CBC:  Basename 07/04/12 0654  WBC 10.2  NEUTROABS --  HGB 9.1*  HCT 27.9*  MCV 101.1*  PLT 132*   Cardiac Enzymes:  Basename 07/03/12 0930  CKTOTAL --  CKMB --  CKMBINDEX --  TROPONINI <0.30   BNP: No components found with this basename: POCBNP:3 D-Dimer: No results found for this basename: DDIMER:2 in the last 72 hours Hemoglobin A1C: No results found for this basename: HGBA1C in the last 72 hours Fasting Lipid Panel: No results found for this basename: CHOL,HDL,LDLCALC,TRIG,CHOLHDL,LDLDIRECT in the last 72 hours Thyroid Function Tests: No results found for this basename: TSH,T4TOTAL,FREET3,T3FREE,THYROIDAB in the last 72 hours Anemia Panel: No results found for this basename: VITAMINB12,FOLATE,FERRITIN,TIBC,IRON,RETICCTPCT in the last 72 hours  RADIOLOGY: Dg  Chest 2 View  07/02/2012  *RADIOLOGY REPORT*  Clinical Data: This of breath.  Question pneumonia or edema  CHEST - 2 VIEW  Comparison: 06/29/2012  Findings: A dual lead pacer is in place and lead tips projecting over the right atrium and right ventricle are stable in position. The patient is status post median sternotomy and stent placement. Surgical clips are noted in the superior mediastinum.  Cardiomegaly is identified and appears stable in degree.  Aortic ectasia with aortic calcification is noted.  The lung fields demonstrate coarseness to the interstitial markings diffusely throughout both lung fields with basilar interstitial septal lines, fissural prominence and blunting of the posterior costophrenic angles bilaterally suggesting small pleural effusions. Findings are again most compatible with interstitial edema.  More focal density at the right lung base is felt likely due to localized alveolar edema.  Little interval change has occurred since the most recent exam.  Bony structures are intact.  IMPRESSION: Stable  cardiomegaly and findings suggestive of pulmonary edema.  No definite focal consolidation.   Original Report Authenticated By: Rhodia Albright, M.D.    Nm Myocar Multi W/spect W/wall Motion / Ef  07/05/2012  *RADIOLOGY REPORT*  Clinical Data:  Chest pain  MYOCARDIAL IMAGING WITH SPECT (REST AND PHARMACOLOGIC-STRESS) GATED LEFT VENTRICULAR WALL MOTION STUDY LEFT VENTRICULAR EJECTION FRACTION  Technique:  Standard myocardial SPECT imaging was performed after resting intravenous injection of 10 mCi Tc-82m sestamibi. Subsequently, intravenous infusion of lexiscan was performed under the supervision of the Cardiology staff.  At peak effect of the drug, 30 mCi Tc-33m sestamibi was injected intravenously and standard myocardial SPECT  imaging was performed.  Quantitative gated imaging was also performed to evaluate left ventricular wall motion, and estimate left ventricular ejection fraction.  Comparison:  Chest radiograph - 07/02/2012  Findings:  Review of the rotational raw images demonstrates mild attenuation secondary to the left arm being placed down by the patient's size. No significant patient motion artifact.  There is a minimal amount of extravasated radiotracer within the right arm.  SPECT imaging demonstrates marked dilatation of the left ventricular cavity.  There is mild attenuation involving the anterior wall and to a lesser extent, the inferior wall, which improves on the provided stress images.  No scintigraphic evidence of prior infarction or pharmacologically induced ischemia.  Quantitative gated analysis shows dyskinesia involving the septum and inferior wall.  The apex is noted to be akinetic.  The resting left ventricular ejection fraction is 32% with end- diastolic volume of 177 ml and end-systolic volume of 121 ml.  IMPRESSION: 1.  No scintigraphic evidence of prior infarction or pharmacologically induced ischemia. 2.  Dilated left ventricle with dyskinesia of the septum and inferior wall and  akinesia involving the left ventricular apex. Ejection fraction - 32%.   Original Report Authenticated By: Tacey Ruiz, MD     PHYSICAL EXAM General: NAD Neck: No JVD, no thyromegaly or thyroid nodule.  Lungs: Clear to auscultation bilaterally with normal respiratory effort. CV: Nondisplaced PMI.  Heart regular S1/S2, no S3/S4, no murmur.  No peripheral edema.  No carotid bruit.  Normal pedal pulses.  Abdomen: Soft, nontender, no hepatosplenomegaly, no distention.  Neurologic: Alert and oriented x 3.  Psych: Normal affect. Extremities: No clubbing or cyanosis.   TELEMETRY: Reviewed telemetry pt v-paced  ASSESSMENT AND PLAN:  79 yo with history of CAD s/p CABG, CHF, ESRD, and COPD intermittently on home oxygen was admitted with dyspnea/pulmonary edema. Cardiology was asked to consult for chest pain.  1. Chest pain: Known CAD s/p CABG. Last cath in 3/12 with patent grafts. ECG is v-paced. Negative cardiac enzymes.  No ischemia by myoview. She should continue ASA, statin, Coreg, isordil. She can stop Plavix (was not on at home). 2. Dyspnea: Suspect multifactorial. I think she has a COPD exaberbation. She also has a component of CHF/pulmonary based on CXR. Currently, JVD does not appear elevated. Continue COPD treatment.  3. CHF: Acute on chronic systolic CHF, resolving with HD.  EF 32% by Myoview, getting echo today. - With improvement in volume, should be ok to increase Coreg to 12.5 mg bid.   - She has a history of what sounds like angioedema with ACEI, so would add hydralazine to her isordil regimen as the next step for cardiomyopathy management. - Will get echo to confirm LV function.   [name] 07/06/2012 8:07 AM

## 2012-07-06 NOTE — Progress Notes (Signed)
Patient ID: [name], female   DOB: Sep 21, 1932, 79 y.o.   MRN: [id number] Family would like to discuss withdrawal from HD.  Offer Hospice if goes home or palliative care here.  We discussed implic and reasons, understanding, etc.

## 2012-07-06 NOTE — Consult Note (Signed)
Met with [name], her two daughters and one grand-daughter for goals of care meeting.  Summary of Goals:  1. Issues with completing HD, upon detailed discussion she has poorly controlled back pain and anxiety during HD, she suffers also from anticipatory anxiety prior to her HD days and does not sleep well. Overall her HD experience is miserable, she feels dehumanized in addition to feeling a lack of control over her health problems.  Active Issues:  1. Back pain 2. Failure to thrive 3. Anticipatory anxiety 4. Insomnia 5. Irritability/Depression  Plan: 1. Start low dose Fentanyl patch which will not be affected by HD 2. Temazapam qhs for insomnia 3. Topical anesthetic for rectal pain 4. Aggressive treatment of constipation which also makes her irritable.  Hopefully with a more palliative approach to her physical and emotional suffering she will be able to tolerate a complete HD. She has a great deal of anger and frustration directed toward her perceived mistreatment and disrespect by HD center staff in [place]. She responds well to empowerment and autonomy support and also to compassion. Will discuss with Dr. Hyman Hopes plans for palliation. She is not Hospice appropriate based on her goals of care.  We also discussed stopping HD which she is clearly not ready to do-she also is hesitant to discuss EOL issues or code status, but I provided education and resources on those topics for her.  Full consult note to follow.

## 2012-07-06 NOTE — Progress Notes (Signed)
Patient discharged to home. Patient AVS reviewed with patient and patient's daughter (POA).  Prescriptions given to patient. Patient and daughter verbalized understanding of medications and follow-up appointments.  Patient remains stable; no signs or symptoms of distress.  Patient educated to return to the ER in cases of SOB, dizziness, fever, chest pain, or fainting.

## 2012-07-07 NOTE — Care Management Note (Signed)
Late entry:    CARE MANAGEMENT NOTE 07/07/2012  Patient:  [name], [name]   Account Number:  [id number]  Date Initiated:  06/30/2012  Documentation initiated by:  Darlyne Russian  Subjective/Objective Assessment:   Patient admitted with SOB, missed HD, recent antibiotics for PNA.     Action/Plan:   Progression of care and discharge planning  01/29 Met with pt re [facility] services, pt unable to recall name of [facility] aide agency that provides this service.  02/03 spoke with [facility] who was active with pt prior to adm. will send orders.   Anticipated DC Date:  07/06/2012   Anticipated DC Plan:  HOME W HOME HEALTH SERVICES         Choice offered to / List presented to:          [facility] arranged  HH-4 NURSE'S AIDE  HH-1 RN  HH-2 PT      [facility] agency  [facility]   Status of service:  Completed, signed off Medicare Important Message given?   (If response is "NO", the following Medicare IM given date fields will be blank) Date Medicare IM given:   Date Additional Medicare IM given:    Discharge Disposition:  HOME W HOME HEALTH SERVICES  Per UR Regulation:  Reviewed for med. necessity/level of care/duration of stay  If discussed at Long Length of Stay Meetings, dates discussed:    Comments:  07/06/2012 Pt active with [facility] will resume services per pt wishes.  Johny Shock RN [facility]  07/01/2012 Per Nursing Admission pt has HH aide 6 days per week from 9a-3p. Most likely provided by pt Medicaid. CM will follow for other d/c needs. Johny Shock RN MPH Case Manager [phone]

## 2012-07-09 NOTE — Progress Notes (Signed)
A user error has taken place: encounter opened in error, closed for administrative reasons.  Patient's appointment for today was cancelled due to hospitalization.

## 2013-06-07 ENCOUNTER — Encounter (HOSPITAL_COMMUNITY): Payer: Self-pay | Admitting: Emergency Medicine

## 2013-06-07 ENCOUNTER — Emergency Department (HOSPITAL_COMMUNITY)
Admission: EM | Admit: 2013-06-07 | Discharge: 2013-06-08 | Disposition: A | Discharge status: Home / Self Care | Payer: Medicare Other | Attending: Emergency Medicine | Admitting: Emergency Medicine

## 2013-06-07 ENCOUNTER — Emergency Department (HOSPITAL_COMMUNITY): Payer: Medicare Other

## 2013-06-07 DIAGNOSIS — Z7982 Long term (current) use of aspirin: Secondary | ICD-10-CM | POA: Insufficient documentation

## 2013-06-07 DIAGNOSIS — D649 Anemia, unspecified: Secondary | ICD-10-CM | POA: Insufficient documentation

## 2013-06-07 DIAGNOSIS — Z951 Presence of aortocoronary bypass graft: Secondary | ICD-10-CM | POA: Insufficient documentation

## 2013-06-07 DIAGNOSIS — Z9115 Patient's noncompliance with renal dialysis: Secondary | ICD-10-CM | POA: Insufficient documentation

## 2013-06-07 DIAGNOSIS — J441 Chronic obstructive pulmonary disease with (acute) exacerbation: Secondary | ICD-10-CM | POA: Insufficient documentation

## 2013-06-07 DIAGNOSIS — I714 Abdominal aortic aneurysm, without rupture, unspecified: Secondary | ICD-10-CM | POA: Insufficient documentation

## 2013-06-07 DIAGNOSIS — Z992 Dependence on renal dialysis: Secondary | ICD-10-CM | POA: Insufficient documentation

## 2013-06-07 DIAGNOSIS — R0602 Shortness of breath: Secondary | ICD-10-CM | POA: Insufficient documentation

## 2013-06-07 DIAGNOSIS — F17200 Nicotine dependence, unspecified, uncomplicated: Secondary | ICD-10-CM | POA: Insufficient documentation

## 2013-06-07 DIAGNOSIS — E8779 Other fluid overload: Secondary | ICD-10-CM | POA: Insufficient documentation

## 2013-06-07 DIAGNOSIS — I120 Hypertensive chronic kidney disease with stage 5 chronic kidney disease or end stage renal disease: Secondary | ICD-10-CM | POA: Insufficient documentation

## 2013-06-07 DIAGNOSIS — E8770 Fluid overload, unspecified: Secondary | ICD-10-CM

## 2013-06-07 DIAGNOSIS — I6529 Occlusion and stenosis of unspecified carotid artery: Secondary | ICD-10-CM | POA: Insufficient documentation

## 2013-06-07 DIAGNOSIS — I658 Occlusion and stenosis of other precerebral arteries: Secondary | ICD-10-CM | POA: Insufficient documentation

## 2013-06-07 DIAGNOSIS — F3289 Other specified depressive episodes: Secondary | ICD-10-CM | POA: Insufficient documentation

## 2013-06-07 DIAGNOSIS — N2581 Secondary hyperparathyroidism of renal origin: Secondary | ICD-10-CM | POA: Insufficient documentation

## 2013-06-07 DIAGNOSIS — I739 Peripheral vascular disease, unspecified: Secondary | ICD-10-CM | POA: Insufficient documentation

## 2013-06-07 DIAGNOSIS — E875 Hyperkalemia: Secondary | ICD-10-CM | POA: Insufficient documentation

## 2013-06-07 DIAGNOSIS — F329 Major depressive disorder, single episode, unspecified: Secondary | ICD-10-CM | POA: Insufficient documentation

## 2013-06-07 DIAGNOSIS — J40 Bronchitis, not specified as acute or chronic: Secondary | ICD-10-CM

## 2013-06-07 DIAGNOSIS — Z720 Tobacco use: Secondary | ICD-10-CM

## 2013-06-07 DIAGNOSIS — E039 Hypothyroidism, unspecified: Secondary | ICD-10-CM | POA: Insufficient documentation

## 2013-06-07 DIAGNOSIS — I509 Heart failure, unspecified: Secondary | ICD-10-CM | POA: Insufficient documentation

## 2013-06-07 DIAGNOSIS — E785 Hyperlipidemia, unspecified: Secondary | ICD-10-CM | POA: Insufficient documentation

## 2013-06-07 DIAGNOSIS — Z91158 Patient's noncompliance with renal dialysis for other reason: Secondary | ICD-10-CM

## 2013-06-07 DIAGNOSIS — Z8673 Personal history of transient ischemic attack (TIA), and cerebral infarction without residual deficits: Secondary | ICD-10-CM | POA: Insufficient documentation

## 2013-06-07 DIAGNOSIS — E162 Hypoglycemia, unspecified: Secondary | ICD-10-CM

## 2013-06-07 DIAGNOSIS — N186 End stage renal disease: Secondary | ICD-10-CM

## 2013-06-07 LAB — GLUCOSE, CAPILLARY
Glucose-Capillary: 44 mg/dL — CL (ref 70–99)
Glucose-Capillary: 191 mg/dL — ABNORMAL HIGH (ref 70–99)
Glucose-Capillary: 120 mg/dL — ABNORMAL HIGH (ref 70–99)

## 2013-06-07 LAB — POCT I-STAT, CHEM 8
BUN: 89 mg/dL — ABNORMAL HIGH (ref 6–23)
CALCIUM ION: 1.1 mmol/L — AB (ref 1.13–1.30)
CHLORIDE: 97 meq/L (ref 96–112)
Creatinine, Ser: 12.3 mg/dL — ABNORMAL HIGH (ref 0.50–1.10)
GLUCOSE: 44 mg/dL — AB (ref 70–99)
HEMATOCRIT: 31 % — AB (ref 36.0–46.0)
Hemoglobin: 10.5 g/dL — ABNORMAL LOW (ref 12.0–15.0)
Potassium: 6.6 mEq/L (ref 3.7–5.3)
Sodium: 130 mEq/L — ABNORMAL LOW (ref 137–147)
TCO2: 23 mmol/L (ref 0–100)

## 2013-06-07 LAB — BASIC METABOLIC PANEL
BUN: 79 mg/dL — ABNORMAL HIGH (ref 6–23)
CALCIUM: 9.2 mg/dL (ref 8.4–10.5)
CO2: 22 mEq/L (ref 19–32)
Chloride: 89 mEq/L — ABNORMAL LOW (ref 96–112)
Creatinine, Ser: 12.14 mg/dL — ABNORMAL HIGH (ref 0.50–1.10)
GFR calc Af Amer: 3 mL/min — ABNORMAL LOW (ref >90)
GFR, EST NON AFRICAN AMERICAN: 3 mL/min — AB (ref >90)
Glucose, Bld: 51 mg/dL — ABNORMAL LOW (ref 70–99)
POTASSIUM: 6.5 meq/L — AB (ref 3.7–5.3)
SODIUM: 134 meq/L — AB (ref 137–147)

## 2013-06-07 MED ORDER — SODIUM CHLORIDE 0.9 % IV SOLN
1.0000 g | Freq: Once | INTRAVENOUS | Status: AC
  Administered 2013-06-07: 1 g via INTRAVENOUS
  Filled 2013-06-07: qty 10

## 2013-06-07 MED ORDER — INSULIN ASPART 100 UNIT/ML IV SOLN
10.0000 [IU] | Freq: Once | INTRAVENOUS | Status: AC
  Administered 2013-06-07: 10 [IU] via INTRAVENOUS
  Filled 2013-06-07: qty 0.1

## 2013-06-07 MED ORDER — PREDNISONE 20 MG PO TABS
60.0000 mg | ORAL_TABLET | Freq: Once | ORAL | Status: AC
  Administered 2013-06-07: 60 mg via ORAL
  Filled 2013-06-07: qty 3

## 2013-06-07 MED ORDER — ALBUTEROL SULFATE (2.5 MG/3ML) 0.083% IN NEBU
5.0000 mg | INHALATION_SOLUTION | Freq: Once | RESPIRATORY_TRACT | Status: AC
  Administered 2013-06-07: 5 mg via RESPIRATORY_TRACT
  Filled 2013-06-07: qty 6

## 2013-06-07 MED ORDER — DEXTROSE 50 % IV SOLN
25.0000 mL | Freq: Once | INTRAVENOUS | Status: AC
  Administered 2013-06-07: 50 mL via INTRAVENOUS
  Filled 2013-06-07: qty 50

## 2013-06-07 MED ORDER — PREDNISONE 20 MG PO TABS: 20.0000 mg | ORAL_TABLET | Freq: Two times a day (BID) | ORAL | Status: DC

## 2013-06-07 MED ORDER — LEVOFLOXACIN 500 MG PO TABS: 500.0000 mg | ORAL_TABLET | ORAL | Status: DC

## 2013-06-07 MED ORDER — IPRATROPIUM BROMIDE 0.02 % IN SOLN
0.5000 mg | Freq: Once | RESPIRATORY_TRACT | Status: AC
  Administered 2013-06-07: 0.5 mg via RESPIRATORY_TRACT
  Filled 2013-06-07: qty 2.5

## 2013-06-07 MED ORDER — DEXTROSE 50 % IV SOLN
1.0000 | Freq: Once | INTRAVENOUS | Status: AC
Start: 2013-06-07 — End: 2013-06-07
  Administered 2013-06-07: 50 mL via INTRAVENOUS

## 2013-06-07 MED ORDER — LEVOFLOXACIN 750 MG PO TABS
750.0000 mg | ORAL_TABLET | Freq: Once | ORAL | Status: AC
  Administered 2013-06-07: 750 mg via ORAL
  Filled 2013-06-07: qty 1

## 2013-06-07 MED ORDER — SODIUM POLYSTYRENE SULFONATE 15 GM/60ML PO SUSP
30.0000 g | Freq: Once | ORAL | Status: AC
  Administered 2013-06-07: 30 g via ORAL
  Filled 2013-06-07: qty 120

## 2013-06-07 MED ORDER — SODIUM CHLORIDE 0.9 % IV SOLN
1.0000 g | Freq: Once | INTRAVENOUS | Status: DC
  Filled 2013-06-07: qty 10

## 2013-06-07 MED ORDER — DEXTROSE 50 % IV SOLN
INTRAVENOUS | Status: AC
  Filled 2013-06-07: qty 50

## 2013-06-07 NOTE — Consult Note (Signed)
Subjective:  80 yo wf with esrd noncompliant with hd missed last hd fri 1/02 and today sec to feeling Bad. Temp[ at home 100), coughing ho copd. continues to smoke cigarretes, averaging 2.5 hr of 3.5 hd time prescribed on hd [facility] kid. Center (signs off early) MWF. Came to er co being sick . K elavated to 6.6 Objective Vital signs in last 24 hours: Filed Vitals:   06/07/13 1431 06/07/13 1500 06/07/13 1608 06/07/13 1615  BP: 152/61 152/61 117/61 114/89  Pulse: 76 77 75 75  Temp:      TempSrc:      Resp: 16 18 24 25   SpO2: 97% 93% 96% 95%   Weight change:   Labs: Basic Metabolic Panel:  Recent Labs Lab 06/07/13 1308  NA 130*  K 6.6*  CL 97  GLUCOSE 44*  BUN 89*  CREATININE 12.30*   Liver Function Tests: No results found for this basename: AST, ALT, ALKPHOS, BILITOT, PROT, ALBUMIN,  in the last 168 hours No results found for this basename: LIPASE, AMYLASE,  in the last 168 hours No results found for this basename: AMMONIA,  in the last 168 hours CBC:  Recent Labs Lab 06/07/13 1308  HGB 10.5*  HCT 31.0*   Cardiac Enzymes: No results found for this basename: CKTOTAL, CKMB, CKMBINDEX, TROPONINI,  in the last 168 hours CBG:  Recent Labs Lab 06/07/13 1428 06/07/13 1459  GLUCAP 44* 191*    Studies/Results: Dg Chest Port 1 View  06/07/2013   CLINICAL DATA:  Cough.  Headache.  EXAM: PORTABLE CHEST - 1 VIEW  COMPARISON:  Chest x-ray 07/02/2012.  FINDINGS: Mediastinum and hilar structures normal. Cardiomegaly. Pulmonary vascular prominence and interstitial prominence. These findings suggest congestive heart failure with mild interstitial pulmonary edema appear. No focal alveolar infiltrate. No pleural effusion or pneumothorax. Prior CABG. Cardiac pacer noted with lead tips in right atrium right ventricle. No acute osseous abnormality.  IMPRESSION: Congestive heart failure with mild pulmonary interstitial edema. Prior CABG. Cardiac pacer noted.   Electronically Signed   By: Maisie Fushomas   Register   On: 06/07/2013 12:06   Medications: . calcium gluconate 1 GM IV     . dextrose  25 mL Intravenous Once  . insulin aspart  10 Units Intravenous Once  . levofloxacin  750 mg Oral Once  . sodium polystyrene  30 g Oral Once    Physical Exam: General: Alert thin chroincially ill wf nad Heart: RRR no rub soft 1/.6 sem lsb Lungs: diffuse wheezing all fields Abdomen:  bs pos. Soft nontender Extremities: Dialysis Access: no pedal edema L U A AVF pos. Bruit    Problem/Plan: 1.  Hyperkalemis sec to missed hd = noted 30 gm po polystyrene given and hd tonight and dc if stable after / stressed need for hd compliance 2. COPD flare- inhalers and stop smoking/ levofloxacin rx in er po  3. ESRD - MWF HD sch  4. Anemia - hgb  10.5 next esa on wed  5. Secondary hyperparathyroidism - no vit d with ho hypercalcemia out pt. Records/binders as op 6. HTN/volume - stable bp in er Uf to edw  Lenny Pastelavid Zeyfang, PA-C [facility] Beeper [phone] 06/07/2013,5:12 PM  LOS: 0 days  Plan dialysis tonight for hyperkalemia.  Discharge to home after HD

## 2013-06-07 NOTE — ED Notes (Signed)
CBG 120 

## 2013-06-07 NOTE — ED Notes (Signed)
Pt ride, daughter [phone] cell,  [phone] home, to call when pt is ready for pick up.

## 2013-06-07 NOTE — ED Notes (Signed)
Checked patient blood sugar it was 44 notified RN Bertley of blood sugar

## 2013-06-07 NOTE — ED Notes (Signed)
Pt given orange juice and graham crackers with peanut butter to help with blood sugar. Will recheck glucose in 20-30 minutes.

## 2013-06-07 NOTE — ED Notes (Signed)
i stat chem 8 shown to Dr. Effie ShyWentz

## 2013-06-07 NOTE — ED Notes (Signed)
Pt only ate one bite of [place] sandwich

## 2013-06-07 NOTE — ED Notes (Signed)
Pt states she does not want to eat anything, food tray in trash, pt states she only ate the pudding. Pt encouraged to eat food, given [place]key sandwich and crackers with water.

## 2013-06-07 NOTE — ED Notes (Signed)
Per EMS pt from home with c/o missed dialysis Friday and today. Pt has had increase in shortness of breath x 2 days and generalized weakness. Hx of COPD, CHF. Wheezing throughout, pt took albuterol tx this am. VSS. No pain. CBG 56. IV 20G RAC.

## 2013-06-07 NOTE — ED Provider Notes (Signed)
CSN: [id number]     Arrival date & time 06/07/13  1120 History   First MD Initiated Contact with Patient 06/07/13 1211     Chief Complaint  Patient presents with  . Cough  . Shortness of Breath   (Consider location/radiation/quality/duration/timing/severity/associated sxs/prior Treatment) HPI Comments: [name] is a 80 y.o. female presents for evaluation of ongoing cough present for several days and worsening. She used her nebulizer only twice today, but it did not help. She last dialyzed 3 days ago, in due  Today. She states that she got upset earlier and now feels better. She states that this happens frequently. She's been eating well. She denies fever, nausea, or vomiting. She continues to smoke at home. She uses oxygen at home. There are no other known modifying factors  Patient is a 80 y.o. female presenting with cough and shortness of breath. The history is provided by the patient.  Cough Associated symptoms: shortness of breath   Shortness of Breath Associated symptoms: cough     Past Medical History  Diagnosis Date  . Carotid artery occlusion     Bilateral carotid artery disease  . Peripheral artery disease   . Arrhythmia     ? hx of atrial fibrillation  . CHF (congestive heart failure)     EF 25-30%.   . Hypertension   . Hyperlipidemia   . ESRD on hemodialysis 06/20/11    M, W, F; [address], [place].  Started HD in Sept 2008  . AAA (abdominal aortic aneurysm)   . Stroke ` 2007  . Duodenal ulcer 2008  . S/P CABG (coronary artery bypass graft)     CABG in 2008, had 3 MI's prior  . Depression   . Pneumonia   . COPD, severe   . Hypothyroidism   . Anemia    Past Surgical History  Procedure Laterality Date  . Thyroid surgery  2004  . Dg av dialysis  shunt access exist*l* or  03/2000    left upper arm  . Vaginal hysterectomy  1976  . Hemorrhoid surgery  1971  . Insert / replace / remove pacemaker  1990's    initial placement  . Insert / replace / remove  pacemaker  05/2011    "changed out"  . Incisional hernia repair  10/2010    incarcerated  . Common iliac  06/2009    bilateral kissing stents  . Pseudoaneurysm repair  09/1999    right groin  . Av fistula repair  04/2007    left upper arm  . Cardiac catheterization  02/10, 03/12    Most recent showed 3 vessel CAD with patent grafts: SVG to LAD, SVG to PDA and SVG to OM  . Coronary angioplasty with stent placement  09/1999    "1"  . Coronary artery bypass graft  02/2007    CABG X3   No family history on file. History  Substance Use Topics  . Smoking status: Former Smoker -- 0.50 packs/day for 40 years    Types: Cigarettes    Quit date: 06/03/2006  . Smokeless tobacco: Never Used  . Alcohol Use: No   OB History   Grav Para Term Preterm Abortions TAB SAB Ect Mult Living                 Review of Systems  Respiratory: Positive for cough and shortness of breath.   All other systems reviewed and are negative.    Allergies  Ivp dye; Lisinopril; and Iohexol  Home Medications   Current Outpatient Rx  Name  Route  Sig  Dispense  Refill  . albuterol (PROVENTIL) (5 MG/ML) 0.5% nebulizer solution   Nebulization   Take 2.5 mg by nebulization 3 (three) times daily.         Marland Kitchen albuterol (PROVENTIL) (5 MG/ML) 0.5% nebulizer solution   Nebulization   Take 2.5 mg by nebulization every 2 (two) hours as needed for wheezing or shortness of breath.         Marland Kitchen aspirin 81 MG chewable tablet   Oral   Chew 81 mg by mouth daily.         . benzonatate (TESSALON) 200 MG capsule   Oral   Take 1 capsule (200 mg total) by mouth 3 (three) times daily as needed for cough. For cough   30 capsule   0   . budesonide (PULMICORT) 0.25 MG/2ML nebulizer solution   Nebulization   Take 2 mLs (0.25 mg total) by nebulization 2 (two) times daily.   60 mL   2   . calcium acetate (PHOSLO) 667 MG capsule   Oral   Take 667 mg by mouth every morning.          . Cholecalciferol (VITAMIN D) 2000  UNITS tablet   Oral   Take 2,000 Units by mouth daily.         . cinacalcet (SENSIPAR) 60 MG tablet   Oral   Take 60 mg by mouth daily.         Marland Kitchen docusate sodium (COLACE) 100 MG capsule   Oral   Take 100 mg by mouth 2 (two) times daily.          . fluticasone (FLONASE) 50 MCG/ACT nasal spray   Nasal   Place 2 sprays into the nose daily as needed. For allergies         . folic acid (FOLVITE) 1 MG tablet   Oral   Take 1 mg by mouth daily.         . furosemide (LASIX) 80 MG tablet   Oral   Take 80 mg by mouth daily.          Marland Kitchen levothyroxine (SYNTHROID, LEVOTHROID) 137 MCG tablet   Oral   Take 137 mcg by mouth daily.         Marland Kitchen loratadine (CLARITIN) 10 MG tablet   Oral   Take 10 mg by mouth daily.         . megestrol (MEGACE) 400 MG/10ML suspension   Oral   Take 1,200 mg by mouth daily.          . multivitamin (RENA-VIT) TABS tablet   Oral   Take 1 tablet by mouth daily.         . nitroGLYCERIN (NITROSTAT) 0.4 MG SL tablet   Sublingual   Place 0.4 mg under the tongue every 5 (five) minutes as needed. For chest pain         . olopatadine (PATANOL) 0.1 % ophthalmic solution   Both Eyes   Place 1 drop into both eyes daily as needed for allergies. For dry eyes         . oxyCODONE (OXY IR/ROXICODONE) 5 MG immediate release tablet   Oral   Take 5 mg by mouth every 4 (four) hours as needed. For pain         . pantoprazole (PROTONIX) 40 MG tablet   Oral   Take 1 tablet (40 mg total) by mouth  daily.   30 tablet   3   . promethazine (PHENERGAN) 25 MG tablet   Oral   Take 25 mg by mouth every 6 (six) hours as needed. For nausea         . senna (SENOKOT) 8.6 MG TABS   Oral   Take 2 tablets (17.2 mg total) by mouth 2 (two) times daily.   120 each   3   . temazepam (RESTORIL) 7.5 MG capsule   Oral   Take 1 capsule (7.5 mg total) by mouth at bedtime as needed for sleep.   30 capsule   3   . tiotropium (SPIRIVA) 18 MCG inhalation  capsule   Inhalation   Place 1 capsule (18 mcg total) into inhaler and inhale daily.   30 capsule   0   . levofloxacin (LEVAQUIN) 500 MG tablet   Oral   Take 1 tablet (500 mg total) by mouth every other day.   4 tablet   0   . predniSONE (DELTASONE) 20 MG tablet   Oral   Take 1 tablet (20 mg total) by mouth 2 (two) times daily.   10 tablet   0    BP 105/50  Pulse 76  Temp(Src) 97.6 F (36.4 C) (Oral)  Resp 20  Ht 5\' 3"  (1.6 m)  Wt 118 lb 6.2 oz (53.7 kg)  BMI 20.98 kg/m2  SpO2 94% Physical Exam  Nursing note and vitals reviewed. Constitutional: She is oriented to person, place, and time. She appears well-developed.  Appears older than stated age  HENT:  Head: Normocephalic and atraumatic.  Eyes: Conjunctivae and EOM are normal. Pupils are equal, round, and reactive to light.  Neck: Normal range of motion and phonation normal. Neck supple.  Cardiovascular: Normal rate, regular rhythm and intact distal pulses.   Vascular fistula, left arm has normal throughout  Pulmonary/Chest: Effort normal. No respiratory distress (She is resting comfortably in a semi-Fowler's position). She exhibits no tenderness.  Decreased edema bilaterally with generalized wheezing  Abdominal: Soft. She exhibits no distension. There is no tenderness. There is no guarding.  Musculoskeletal: Normal range of motion.  Neurological: She is alert and oriented to person, place, and time. She exhibits normal muscle tone.  Skin: Skin is warm and dry.  Psychiatric: She has a normal mood and affect. Her behavior is normal. Judgment and thought content normal.    ED Course  Procedures (including critical care time) 12:40- I discussed the case with the on-call nephrologist. She states that the patient could potentially be treated in emergency department, and transferred to her dialysis unit for dialysis later this afternoon.  Medications  albuterol (PROVENTIL) (2.5 MG/3ML) 0.083% nebulizer solution 5 mg (5  mg Nebulization Given 06/07/13 1237)  ipratropium (ATROVENT) nebulizer solution 0.5 mg (0.5 mg Nebulization Given 06/07/13 1237)  predniSONE (DELTASONE) tablet 60 mg (60 mg Oral Given 06/07/13 1237)  sodium polystyrene (KAYEXALATE) 15 GM/60ML suspension 30 g (30 g Oral Given 06/07/13 1331)  dextrose 50 % solution 50 mL (50 mLs Intravenous Given 06/07/13 1434)  sodium polystyrene (KAYEXALATE) 15 GM/60ML suspension 30 g (30 g Oral Given 06/07/13 1712)  calcium gluconate 1 g in sodium chloride 0.9 % 100 mL IVPB (0 g Intravenous Stopped 06/07/13 1900)  insulin aspart (novoLOG) injection 10 Units (10 Units Intravenous Given 06/07/13 1751)  dextrose 50 % solution 25 mL (50 mLs Intravenous Given 06/07/13 1750)  levofloxacin (LEVAQUIN) tablet 750 mg (750 mg Oral Given 06/07/13 1752)    Patient Vitals  for the past 24 hrs:  BP Temp Temp src Pulse Resp SpO2 Height Weight  06/08/13 0040 105/50 mmHg 97.6 F (36.4 C) Oral 76 20 94 % - -  06/08/13 0030 97/46 mmHg - - 78 18 100 % - -  06/08/13 0000 103/59 mmHg - - 76 18 100 % - -  06/07/13 2330 129/52 mmHg - - 76 18 93 % - -  06/07/13 2300 131/52 mmHg - - 77 18 96 % - -  06/07/13 2230 122/59 mmHg - - 77 17 95 % - -  06/07/13 2200 139/59 mmHg - - 75 19 96 % - -  06/07/13 2138 143/63 mmHg - - 77 18 - - -  06/07/13 2130 140/79 mmHg 97.9 F (36.6 C) Oral 78 13 100 % - 118 lb 6.2 oz (53.7 kg)  06/07/13 2100 137/72 mmHg - - 77 14 93 % - -  06/07/13 2045 128/59 mmHg - - 76 14 95 % - -  06/07/13 2030 142/64 mmHg - - 75 13 95 % - -  06/07/13 2015 134/65 mmHg - - 75 20 96 % - -  06/07/13 2006 - - - - - 94 % - -  06/07/13 2004 146/65 mmHg 98 F (36.7 C) Oral - 14 94 % 5\' 3"  (1.6 m) 113 lb (51.256 kg)  06/07/13 1915 148/68 mmHg - - 81 23 98 % - -  06/07/13 1806 133/65 mmHg - - 75 17 96 % - -    4:53 PM Reevaluation with update and discussion. After initial assessment and treatment, an updated evaluation reveals she states that she feels better. Lungs have improved air movement.  Her son states that she has not had dialysis for one week. Keierra Nudo L   16:45- he is discussed again with Dr. Eliott Nine, nephrologist. She would like patient's hyperkalemia, treated medically, and she will arrange for dialysis later this evening.  Pharmacy consultation, for Levaquin dosing  CRITICAL CARE Performed by: Flint Melter Total critical care time: 55 minutes Critical care time was exclusive of separately billable procedures and treating other patients. Critical care was necessary to treat or prevent imminent or life-threatening deterioration. Critical care was time spent personally by me on the following activities: development of treatment plan with patient and/or surrogate as well as nursing, discussions with consultants, evaluation of patient's response to treatment, examination of patient, obtaining history from patient or surrogate, ordering and performing treatments and interventions, ordering and review of laboratory studies, ordering and review of radiographic studies, pulse oximetry and re-evaluation of patient's condition.  Labs Review Labs Reviewed  GLUCOSE, CAPILLARY - Abnormal; Notable for the following:    Glucose-Capillary 44 (*)    All other components within normal limits  GLUCOSE, CAPILLARY - Abnormal; Notable for the following:    Glucose-Capillary 191 (*)    All other components within normal limits  GLUCOSE, CAPILLARY - Abnormal; Notable for the following:    Glucose-Capillary 120 (*)    All other components within normal limits  BASIC METABOLIC PANEL - Abnormal; Notable for the following:    Sodium 134 (*)    Potassium 6.5 (*)    Chloride 89 (*)    Glucose, Bld 51 (*)    BUN 79 (*)    Creatinine, Ser 12.14 (*)    GFR calc non Af Amer 3 (*)    GFR calc Af Amer 3 (*)    All other components within normal limits  POCT I-STAT, CHEM 8 - Abnormal; Notable for the  following:    Sodium 130 (*)    Potassium 6.6 (*)    BUN 89 (*)    Creatinine, Ser  12.30 (*)    Glucose, Bld 44 (*)    Calcium, Ion 1.10 (*)    Hemoglobin 10.5 (*)    HCT 31.0 (*)    All other components within normal limits   Imaging Review Dg Chest Port 1 View  06/07/2013   CLINICAL DATA:  Cough.  Headache.  EXAM: PORTABLE CHEST - 1 VIEW  COMPARISON:  Chest x-ray 07/02/2012.  FINDINGS: Mediastinum and hilar structures normal. Cardiomegaly. Pulmonary vascular prominence and interstitial prominence. These findings suggest congestive heart failure with mild interstitial pulmonary edema appear. No focal alveolar infiltrate. No pleural effusion or pneumothorax. Prior CABG. Cardiac pacer noted with lead tips in right atrium right ventricle. No acute osseous abnormality.  IMPRESSION: Congestive heart failure with mild pulmonary interstitial edema. Prior CABG. Cardiac pacer noted.   Electronically Signed   By: Maisie Fushomas  Register   On: 06/07/2013 12:06    EKG Interpretation    Date/Time:  Monday June 07 2013 11:25:55 EST Ventricular Rate:  79 PR Interval:    QRS Duration: 202 QT Interval:  486 QTC Calculation: 557 R Axis:   -92 Text Interpretation:  Age not entered, assumed to be  80 years old for purpose of ECG interpretation Afib/flut and V-paced complexes No further analysis attempted due to paced rhythm Since last tracing QT has lengthened Confirmed by Cherilynn Schomburg  MD, Cyera Balboni ([id number]) on 06/07/2013 4:31:18 PM            MDM   1. Fluid overload   2. End stage renal disease   3. Noncompliance of patient with renal dialysis   4. Bronchitis   5. Tobacco abuse   6. Hypoglycemia       Nursing Notes Reviewed/ Care Coordinated, and agree without changes. Applicable Imaging Reviewed.  Interpretation of Laboratory Data incorporated into ED treatment  Plan: Dialysis, today as OP, in the hospital, and then go home.  Plan: Home Medications- Levaquin qod, Prednisone; Home Treatments- stop smoking; return here if the recommended treatment, does not improve the symptoms;  Recommended follow up-  PCP 1 week    Flint MelterElliott L Lavar Rosenzweig, MD 06/08/13 [phone]

## 2013-06-07 NOTE — ED Notes (Signed)
Spoke with Dialysis, pt will not be ready for 5-6 hours. Spoke with Dr. Blinda LeatherwoodPollina, will transport pt to pod C to wait, pt will be discharged after dialysis.

## 2013-06-07 NOTE — ED Notes (Signed)
Ordered Reg Diet tray

## 2013-06-07 NOTE — Discharge Instructions (Signed)

## 2013-06-07 NOTE — ED Notes (Signed)
Paged Dr. Eliott Nineunham to [phone]

## 2013-06-07 NOTE — ED Notes (Signed)
Family has been calling for an update about the pt. Pt states that it is ok for this RN to give out information when they call.

## 2013-06-08 NOTE — Progress Notes (Signed)
Pt discharged from hospital in the care of her daughter Gavin PoundDeborah. Pt to return to hemodialysis as scheduled prior to coming to the hospital. Pt taken down to daughters care in a wheelchair accompanied by security due to nursing staff not being able to leave the unit due to other pts being on hemodialysis.

## 2013-06-09 ENCOUNTER — Telehealth (HOSPITAL_BASED_OUTPATIENT_CLINIC_OR_DEPARTMENT_OTHER): Payer: Self-pay | Admitting: *Deleted

## 2013-06-11 ENCOUNTER — Emergency Department (HOSPITAL_COMMUNITY)
Admission: EM | Admit: 2013-06-11 | Discharge: 2013-06-11 | Discharge status: Against Medical Advice | Payer: Medicare Other | Attending: Emergency Medicine | Admitting: Emergency Medicine

## 2013-06-11 ENCOUNTER — Encounter (HOSPITAL_COMMUNITY): Payer: Self-pay | Admitting: Emergency Medicine

## 2013-06-11 DIAGNOSIS — N186 End stage renal disease: Secondary | ICD-10-CM | POA: Insufficient documentation

## 2013-06-11 DIAGNOSIS — Z951 Presence of aortocoronary bypass graft: Secondary | ICD-10-CM | POA: Insufficient documentation

## 2013-06-11 DIAGNOSIS — I509 Heart failure, unspecified: Secondary | ICD-10-CM | POA: Insufficient documentation

## 2013-06-11 DIAGNOSIS — Y838 Other surgical procedures as the cause of abnormal reaction of the patient, or of later complication, without mention of misadventure at the time of the procedure: Secondary | ICD-10-CM | POA: Insufficient documentation

## 2013-06-11 DIAGNOSIS — J449 Chronic obstructive pulmonary disease, unspecified: Secondary | ICD-10-CM | POA: Insufficient documentation

## 2013-06-11 DIAGNOSIS — T82598A Other mechanical complication of other cardiac and vascular devices and implants, initial encounter: Secondary | ICD-10-CM | POA: Insufficient documentation

## 2013-06-11 DIAGNOSIS — Z992 Dependence on renal dialysis: Secondary | ICD-10-CM | POA: Insufficient documentation

## 2013-06-11 DIAGNOSIS — F410 Panic disorder [episodic paroxysmal anxiety] without agoraphobia: Secondary | ICD-10-CM | POA: Insufficient documentation

## 2013-06-11 DIAGNOSIS — J4489 Other specified chronic obstructive pulmonary disease: Secondary | ICD-10-CM | POA: Insufficient documentation

## 2013-06-11 DIAGNOSIS — I120 Hypertensive chronic kidney disease with stage 5 chronic kidney disease or end stage renal disease: Secondary | ICD-10-CM | POA: Insufficient documentation

## 2013-06-11 NOTE — ED Notes (Addendum)
Pt from her dr's office in [place] where she was having a regular checkup.  Pt had a panic attack at dr's office because she did not take her Xanax today.  Pt insisted that she be transferred here because she is due for dialysis today.  [place]  Pt was also c/o cough that sounds congested, but pt states is non-productive.

## 2013-06-11 NOTE — ED Notes (Addendum)
Pt's son is here.  Pt no longer wants to be here.  Pt LWBS after triage.  Pt advised of risks.

## 2013-11-13 ENCOUNTER — Inpatient Hospital Stay (HOSPITAL_COMMUNITY)
Admission: AD | Admit: 2013-11-13 | Discharge: 2013-11-16 | DRG: 682 | Disposition: A | Discharge status: Home / Self Care | Payer: Medicare HMO | Source: Other Acute Inpatient Hospital | Attending: Internal Medicine | Admitting: Internal Medicine

## 2013-11-13 ENCOUNTER — Inpatient Hospital Stay (HOSPITAL_COMMUNITY): Payer: Medicare HMO

## 2013-11-13 ENCOUNTER — Encounter (HOSPITAL_COMMUNITY): Payer: Self-pay | Admitting: *Deleted

## 2013-11-13 DIAGNOSIS — E8779 Other fluid overload: Secondary | ICD-10-CM | POA: Diagnosis present

## 2013-11-13 DIAGNOSIS — Z950 Presence of cardiac pacemaker: Secondary | ICD-10-CM

## 2013-11-13 DIAGNOSIS — B9789 Other viral agents as the cause of diseases classified elsewhere: Secondary | ICD-10-CM | POA: Diagnosis present

## 2013-11-13 DIAGNOSIS — Z91158 Patient's noncompliance with renal dialysis for other reason: Secondary | ICD-10-CM

## 2013-11-13 DIAGNOSIS — Z888 Allergy status to other drugs, medicaments and biological substances status: Secondary | ICD-10-CM

## 2013-11-13 DIAGNOSIS — M949 Disorder of cartilage, unspecified: Secondary | ICD-10-CM

## 2013-11-13 DIAGNOSIS — I120 Hypertensive chronic kidney disease with stage 5 chronic kidney disease or end stage renal disease: Principal | ICD-10-CM | POA: Diagnosis present

## 2013-11-13 DIAGNOSIS — Z7982 Long term (current) use of aspirin: Secondary | ICD-10-CM

## 2013-11-13 DIAGNOSIS — Z951 Presence of aortocoronary bypass graft: Secondary | ICD-10-CM

## 2013-11-13 DIAGNOSIS — F329 Major depressive disorder, single episode, unspecified: Secondary | ICD-10-CM | POA: Diagnosis present

## 2013-11-13 DIAGNOSIS — J4489 Other specified chronic obstructive pulmonary disease: Secondary | ICD-10-CM | POA: Diagnosis present

## 2013-11-13 DIAGNOSIS — I714 Abdominal aortic aneurysm, without rupture, unspecified: Secondary | ICD-10-CM | POA: Diagnosis present

## 2013-11-13 DIAGNOSIS — I739 Peripheral vascular disease, unspecified: Secondary | ICD-10-CM | POA: Diagnosis present

## 2013-11-13 DIAGNOSIS — N186 End stage renal disease: Secondary | ICD-10-CM | POA: Diagnosis present

## 2013-11-13 DIAGNOSIS — F17200 Nicotine dependence, unspecified, uncomplicated: Secondary | ICD-10-CM | POA: Diagnosis present

## 2013-11-13 DIAGNOSIS — Z9115 Patient's noncompliance with renal dialysis: Secondary | ICD-10-CM

## 2013-11-13 DIAGNOSIS — I10 Essential (primary) hypertension: Secondary | ICD-10-CM | POA: Diagnosis present

## 2013-11-13 DIAGNOSIS — F3289 Other specified depressive episodes: Secondary | ICD-10-CM | POA: Diagnosis present

## 2013-11-13 DIAGNOSIS — M899 Disorder of bone, unspecified: Secondary | ICD-10-CM | POA: Diagnosis present

## 2013-11-13 DIAGNOSIS — Z91199 Patient's noncompliance with other medical treatment and regimen due to unspecified reason: Secondary | ICD-10-CM

## 2013-11-13 DIAGNOSIS — Z8673 Personal history of transient ischemic attack (TIA), and cerebral infarction without residual deficits: Secondary | ICD-10-CM

## 2013-11-13 DIAGNOSIS — E785 Hyperlipidemia, unspecified: Secondary | ICD-10-CM | POA: Diagnosis present

## 2013-11-13 DIAGNOSIS — Z9119 Patient's noncompliance with other medical treatment and regimen: Secondary | ICD-10-CM

## 2013-11-13 DIAGNOSIS — F0390 Unspecified dementia without behavioral disturbance: Secondary | ICD-10-CM | POA: Diagnosis present

## 2013-11-13 DIAGNOSIS — J449 Chronic obstructive pulmonary disease, unspecified: Secondary | ICD-10-CM | POA: Diagnosis present

## 2013-11-13 DIAGNOSIS — I6529 Occlusion and stenosis of unspecified carotid artery: Secondary | ICD-10-CM | POA: Diagnosis present

## 2013-11-13 DIAGNOSIS — D649 Anemia, unspecified: Secondary | ICD-10-CM | POA: Diagnosis present

## 2013-11-13 DIAGNOSIS — Z91041 Radiographic dye allergy status: Secondary | ICD-10-CM

## 2013-11-13 DIAGNOSIS — K5900 Constipation, unspecified: Secondary | ICD-10-CM | POA: Diagnosis present

## 2013-11-13 DIAGNOSIS — Z992 Dependence on renal dialysis: Secondary | ICD-10-CM

## 2013-11-13 DIAGNOSIS — Z720 Tobacco use: Secondary | ICD-10-CM | POA: Diagnosis present

## 2013-11-13 DIAGNOSIS — IMO0002 Reserved for concepts with insufficient information to code with codable children: Secondary | ICD-10-CM

## 2013-11-13 DIAGNOSIS — R531 Weakness: Secondary | ICD-10-CM | POA: Diagnosis present

## 2013-11-13 DIAGNOSIS — Z9861 Coronary angioplasty status: Secondary | ICD-10-CM

## 2013-11-13 DIAGNOSIS — I509 Heart failure, unspecified: Secondary | ICD-10-CM | POA: Diagnosis present

## 2013-11-13 DIAGNOSIS — R0602 Shortness of breath: Secondary | ICD-10-CM | POA: Diagnosis present

## 2013-11-13 DIAGNOSIS — N2581 Secondary hyperparathyroidism of renal origin: Secondary | ICD-10-CM | POA: Diagnosis present

## 2013-11-13 DIAGNOSIS — J811 Chronic pulmonary edema: Secondary | ICD-10-CM | POA: Diagnosis present

## 2013-11-13 DIAGNOSIS — E039 Hypothyroidism, unspecified: Secondary | ICD-10-CM | POA: Diagnosis present

## 2013-11-13 HISTORY — DX: Presence of cardiac pacemaker: Z95.0

## 2013-11-13 LAB — RENAL FUNCTION PANEL
Albumin: 2.5 g/dL — ABNORMAL LOW (ref 3.5–5.2)
BUN: 50 mg/dL — ABNORMAL HIGH (ref 6–23)
CALCIUM: 7.7 mg/dL — AB (ref 8.4–10.5)
CHLORIDE: 93 meq/L — AB (ref 96–112)
CO2: 24 meq/L (ref 19–32)
Creatinine, Ser: 6.39 mg/dL — ABNORMAL HIGH (ref 0.50–1.10)
GFR calc non Af Amer: 6 mL/min — ABNORMAL LOW (ref >90)
GFR, EST AFRICAN AMERICAN: 6 mL/min — AB (ref >90)
Glucose, Bld: 92 mg/dL (ref 70–99)
Phosphorus: 6.3 mg/dL — ABNORMAL HIGH (ref 2.3–4.6)
Potassium: 5.1 mEq/L (ref 3.7–5.3)
SODIUM: 138 meq/L (ref 137–147)

## 2013-11-13 LAB — CBC
HCT: 31.1 % — ABNORMAL LOW (ref 36.0–46.0)
HEMOGLOBIN: 9.8 g/dL — AB (ref 12.0–15.0)
MCH: 31.4 pg (ref 26.0–34.0)
MCHC: 31.5 g/dL (ref 30.0–36.0)
MCV: 99.7 fL (ref 78.0–100.0)
Platelets: 253 10*3/uL (ref 150–400)
RBC: 3.12 MIL/uL — AB (ref 3.87–5.11)
RDW: 15.7 % — ABNORMAL HIGH (ref 11.5–15.5)
WBC: 8.2 10*3/uL (ref 4.0–10.5)

## 2013-11-13 LAB — HEPATITIS B SURFACE ANTIGEN: HEP B S AG: NEGATIVE

## 2013-11-13 LAB — TROPONIN I: Troponin I: <0.3 ng/mL (ref <0.30)

## 2013-11-13 LAB — TSH: TSH: 9.64 u[IU]/mL — ABNORMAL HIGH (ref 0.350–4.500)

## 2013-11-13 MED ORDER — VITAMIN D3 25 MCG (1000 UNIT) PO TABS
1000.0000 [IU] | ORAL_TABLET | Freq: Every day | ORAL | Status: DC
  Administered 2013-11-13 – 2013-11-16 (×4): 1000 [IU] via ORAL
  Filled 2013-11-13 (×4): qty 1

## 2013-11-13 MED ORDER — SODIUM CHLORIDE 0.9 % IJ SOLN: 3.0000 mL | INTRAMUSCULAR | Status: DC | PRN

## 2013-11-13 MED ORDER — NEPRO/CARBSTEADY PO LIQD: 237.0000 mL | ORAL | Status: DC | PRN

## 2013-11-13 MED ORDER — TEMAZEPAM 15 MG PO CAPS
30.0000 mg | ORAL_CAPSULE | Freq: Every day | ORAL | Status: DC
  Administered 2013-11-13 – 2013-11-15 (×3): 30 mg via ORAL
  Filled 2013-11-13 (×3): qty 2

## 2013-11-13 MED ORDER — SODIUM CHLORIDE 0.9 % IJ SOLN
3.0000 mL | Freq: Two times a day (BID) | INTRAMUSCULAR | Status: DC
  Administered 2013-11-13 – 2013-11-14 (×3): 3 mL via INTRAVENOUS

## 2013-11-13 MED ORDER — ALBUTEROL SULFATE (2.5 MG/3ML) 0.083% IN NEBU
2.5000 mg | INHALATION_SOLUTION | Freq: Three times a day (TID) | RESPIRATORY_TRACT | Status: DC
  Administered 2013-11-13 – 2013-11-14 (×3): 2.5 mg via RESPIRATORY_TRACT
  Filled 2013-11-13 (×2): qty 3

## 2013-11-13 MED ORDER — LORATADINE 10 MG PO TABS
10.0000 mg | ORAL_TABLET | Freq: Every day | ORAL | Status: DC
  Administered 2013-11-14 – 2013-11-16 (×3): 10 mg via ORAL
  Filled 2013-11-13 (×3): qty 1

## 2013-11-13 MED ORDER — LIDOCAINE HCL (PF) 1 % IJ SOLN: 5.0000 mL | INTRAMUSCULAR | Status: DC | PRN

## 2013-11-13 MED ORDER — OXYCODONE-ACETAMINOPHEN 5-325 MG PO TABS
1.0000 | ORAL_TABLET | Freq: Three times a day (TID) | ORAL | Status: DC
  Administered 2013-11-13 – 2013-11-16 (×9): 1 via ORAL
  Filled 2013-11-13 (×9): qty 1

## 2013-11-13 MED ORDER — OXYCODONE-ACETAMINOPHEN 7.5-325 MG PO TABS: 1.0000 | ORAL_TABLET | Freq: Three times a day (TID) | ORAL | Status: DC

## 2013-11-13 MED ORDER — LIDOCAINE-PRILOCAINE 2.5-2.5 % EX CREA: 1.0000 "application " | TOPICAL_CREAM | CUTANEOUS | Status: DC | PRN

## 2013-11-13 MED ORDER — DOCUSATE SODIUM 100 MG PO CAPS: 100.0000 mg | ORAL_CAPSULE | Freq: Every day | ORAL | Status: DC | PRN

## 2013-11-13 MED ORDER — ALPRAZOLAM 0.5 MG PO TABS
0.5000 mg | ORAL_TABLET | Freq: Three times a day (TID) | ORAL | Status: DC | PRN
  Administered 2013-11-13 – 2013-11-14 (×2): 0.5 mg via ORAL
  Filled 2013-11-13 (×2): qty 1

## 2013-11-13 MED ORDER — HYDRALAZINE HCL 25 MG PO TABS
25.0000 mg | ORAL_TABLET | Freq: Three times a day (TID) | ORAL | Status: DC
  Administered 2013-11-13: 25 mg via ORAL
  Filled 2013-11-13 (×5): qty 1

## 2013-11-13 MED ORDER — ALBUTEROL SULFATE (2.5 MG/3ML) 0.083% IN NEBU: 2.5000 mg | INHALATION_SOLUTION | RESPIRATORY_TRACT | Status: DC | PRN

## 2013-11-13 MED ORDER — ALBUTEROL SULFATE (2.5 MG/3ML) 0.083% IN NEBU
INHALATION_SOLUTION | RESPIRATORY_TRACT | Status: AC
  Filled 2013-11-13: qty 3

## 2013-11-13 MED ORDER — OLOPATADINE HCL 0.1 % OP SOLN
1.0000 [drp] | Freq: Every day | OPHTHALMIC | Status: DC | PRN
  Filled 2013-11-13: qty 5

## 2013-11-13 MED ORDER — ATORVASTATIN CALCIUM 80 MG PO TABS
80.0000 mg | ORAL_TABLET | Freq: Every day | ORAL | Status: DC
  Administered 2013-11-13 – 2013-11-16 (×4): 80 mg via ORAL
  Filled 2013-11-13 (×5): qty 1

## 2013-11-13 MED ORDER — TIOTROPIUM BROMIDE MONOHYDRATE 18 MCG IN CAPS
18.0000 ug | ORAL_CAPSULE | Freq: Every day | RESPIRATORY_TRACT | Status: DC
  Administered 2013-11-16: 18 ug via RESPIRATORY_TRACT
  Filled 2013-11-13: qty 5

## 2013-11-13 MED ORDER — BUDESONIDE 0.25 MG/2ML IN SUSP
0.2500 mg | Freq: Two times a day (BID) | RESPIRATORY_TRACT | Status: DC
  Administered 2013-11-13 – 2013-11-16 (×4): 0.25 mg via RESPIRATORY_TRACT
  Filled 2013-11-13 (×7): qty 2

## 2013-11-13 MED ORDER — RENA-VITE PO TABS
1.0000 | ORAL_TABLET | Freq: Every day | ORAL | Status: DC
  Administered 2013-11-13 – 2013-11-14 (×2): 1 via ORAL
  Administered 2013-11-15: 22:00:00 via ORAL
  Filled 2013-11-13 (×5): qty 1

## 2013-11-13 MED ORDER — PENTAFLUOROPROP-TETRAFLUOROETH EX AERO: 1.0000 "application " | INHALATION_SPRAY | CUTANEOUS | Status: DC | PRN

## 2013-11-13 MED ORDER — DEXTROMETHORPHAN POLISTIREX 30 MG/5ML PO LQCR
30.0000 mg | Freq: Two times a day (BID) | ORAL | Status: DC | PRN
  Administered 2013-11-13 – 2013-11-16 (×3): 30 mg via ORAL
  Filled 2013-11-13 (×3): qty 5

## 2013-11-13 MED ORDER — ACETAMINOPHEN 650 MG RE SUPP: 650.0000 mg | Freq: Four times a day (QID) | RECTAL | Status: DC | PRN

## 2013-11-13 MED ORDER — ASPIRIN EC 81 MG PO TBEC
81.0000 mg | DELAYED_RELEASE_TABLET | Freq: Every day | ORAL | Status: DC
  Administered 2013-11-13 – 2013-11-16 (×4): 81 mg via ORAL
  Filled 2013-11-13 (×4): qty 1

## 2013-11-13 MED ORDER — HEPARIN SODIUM (PORCINE) 5000 UNIT/ML IJ SOLN
5000.0000 [IU] | Freq: Three times a day (TID) | INTRAMUSCULAR | Status: DC
  Administered 2013-11-13 – 2013-11-16 (×8): 5000 [IU] via SUBCUTANEOUS
  Filled 2013-11-13 (×11): qty 1

## 2013-11-13 MED ORDER — FUROSEMIDE 80 MG PO TABS
80.0000 mg | ORAL_TABLET | Freq: Every day | ORAL | Status: DC
  Administered 2013-11-13: 80 mg via ORAL
  Filled 2013-11-13 (×2): qty 1

## 2013-11-13 MED ORDER — ONDANSETRON HCL 4 MG/2ML IJ SOLN: 4.0000 mg | Freq: Four times a day (QID) | INTRAMUSCULAR | Status: DC | PRN

## 2013-11-13 MED ORDER — SODIUM CHLORIDE 0.9 % IV SOLN: 100.0000 mL | INTRAVENOUS | Status: DC | PRN

## 2013-11-13 MED ORDER — OXYCODONE HCL 5 MG PO TABS
2.5000 mg | ORAL_TABLET | Freq: Three times a day (TID) | ORAL | Status: DC
  Administered 2013-11-13 – 2013-11-16 (×9): 2.5 mg via ORAL
  Filled 2013-11-13 (×9): qty 1

## 2013-11-13 MED ORDER — PANTOPRAZOLE SODIUM 40 MG PO TBEC
40.0000 mg | DELAYED_RELEASE_TABLET | Freq: Every day | ORAL | Status: DC
  Administered 2013-11-14 – 2013-11-16 (×3): 40 mg via ORAL
  Filled 2013-11-13 (×3): qty 1

## 2013-11-13 MED ORDER — CINACALCET HCL 30 MG PO TABS
60.0000 mg | ORAL_TABLET | Freq: Every day | ORAL | Status: DC
  Administered 2013-11-13 – 2013-11-16 (×4): 60 mg via ORAL
  Filled 2013-11-13 (×4): qty 2

## 2013-11-13 MED ORDER — ALTEPLASE 2 MG IJ SOLR: 2.0000 mg | Freq: Once | INTRAMUSCULAR | Status: AC | PRN

## 2013-11-13 MED ORDER — NITROGLYCERIN 0.4 MG SL SUBL: 0.4000 mg | SUBLINGUAL_TABLET | SUBLINGUAL | Status: DC | PRN

## 2013-11-13 MED ORDER — SENNA 8.6 MG PO TABS
2.0000 | ORAL_TABLET | Freq: Two times a day (BID) | ORAL | Status: DC
  Administered 2013-11-13 – 2013-11-16 (×6): 17.2 mg via ORAL
  Filled 2013-11-13 (×7): qty 2

## 2013-11-13 MED ORDER — LEVOTHYROXINE SODIUM 150 MCG PO TABS
150.0000 ug | ORAL_TABLET | Freq: Every day | ORAL | Status: DC
  Administered 2013-11-14 – 2013-11-16 (×3): 150 ug via ORAL
  Filled 2013-11-13 (×4): qty 1

## 2013-11-13 MED ORDER — BISACODYL 10 MG RE SUPP: 10.0000 mg | Freq: Every day | RECTAL | Status: DC | PRN

## 2013-11-13 MED ORDER — CALCIUM ACETATE 667 MG PO CAPS
3335.0000 mg | ORAL_CAPSULE | Freq: Three times a day (TID) | ORAL | Status: DC
  Administered 2013-11-13 – 2013-11-16 (×9): 3335 mg via ORAL
  Filled 2013-11-13 (×11): qty 5

## 2013-11-13 MED ORDER — SODIUM CHLORIDE 0.9 % IV SOLN: 62.5000 mg | INTRAVENOUS | Status: DC

## 2013-11-13 MED ORDER — ONDANSETRON HCL 4 MG PO TABS: 4.0000 mg | ORAL_TABLET | Freq: Four times a day (QID) | ORAL | Status: DC | PRN

## 2013-11-13 MED ORDER — HEPARIN SODIUM (PORCINE) 1000 UNIT/ML DIALYSIS
1000.0000 [IU] | INTRAMUSCULAR | Status: DC | PRN
  Filled 2013-11-13: qty 1

## 2013-11-13 MED ORDER — SODIUM CHLORIDE 0.9 % IV SOLN: 250.0000 mL | INTRAVENOUS | Status: DC | PRN

## 2013-11-13 MED ORDER — PROMETHAZINE HCL 25 MG PO TABS: 25.0000 mg | ORAL_TABLET | Freq: Four times a day (QID) | ORAL | Status: DC | PRN

## 2013-11-13 MED ORDER — DARBEPOETIN ALFA-POLYSORBATE 40 MCG/0.4ML IJ SOLN: 40.0000 ug | INTRAMUSCULAR | Status: DC

## 2013-11-13 MED ORDER — ACETAMINOPHEN 325 MG PO TABS
650.0000 mg | ORAL_TABLET | Freq: Four times a day (QID) | ORAL | Status: DC | PRN
  Administered 2013-11-13: 650 mg via ORAL
  Filled 2013-11-13: qty 2

## 2013-11-13 NOTE — Consult Note (Signed)
Indication for Consultation:  Management of ESRD/hemodialysis; anemia, hypertension/volume and secondary hyperparathyroidism  HPI: [name] is a 80 y.o. female who presented to [facility] ED this morning with complaints of generalized weakness. She recieves HD MWF @ [facility], history of HTN, CHF, CABG, COPD. She frequently misses outpt HD and rarely runs her full tx, usually only runs about 2 hours. Her daughter states she hates dialysis but has decided to continue. She reports she has been feeling weak for the past week and finally felt bad enough today to come to the hospital. She was transferred from [facility] because she needed HD, last HD was 6/10. She has been having intermittent nausea and vomiting for the past 10 days and has a poor appetite. Will schedule for HD today.  Past Medical History  Diagnosis Date  . Carotid artery occlusion     Bilateral carotid artery disease  . Peripheral artery disease   . Arrhythmia     ? hx of atrial fibrillation  . CHF (congestive heart failure)     EF 25-30%.   . Hypertension   . Hyperlipidemia   . ESRD on hemodialysis 06/20/11    M, W, F; [address], [place].  Started HD in Sept 2008  . AAA (abdominal aortic aneurysm)   . Stroke ` 2007  . Duodenal ulcer 2008  . S/P CABG (coronary artery bypass graft)     CABG in 2008, had 3 MI's prior  . Depression   . Pneumonia   . COPD, severe   . Hypothyroidism   . Anemia   . Pacemaker    Past Surgical History  Procedure Laterality Date  . Thyroid surgery  2004  . Dg av dialysis  shunt access exist*l* or  03/2000    left upper arm  . Vaginal hysterectomy  1976  . Hemorrhoid surgery  1971  . Insert / replace / remove pacemaker  1990's    initial placement  . Insert / replace / remove pacemaker  05/2011    "changed out"  . Incisional hernia repair  10/2010    incarcerated  . Common iliac  06/2009    bilateral kissing stents  . Pseudoaneurysm repair  09/1999    right groin  . Av fistula repair   04/2007    left upper arm  . Cardiac catheterization  02/10, 03/12    Most recent showed 3 vessel CAD with patent grafts: SVG to LAD, SVG to PDA and SVG to OM  . Coronary angioplasty with stent placement  09/1999    "1"  . Coronary artery bypass graft  02/2007    CABG X3   No family history on file. Social History:  reports that she quit smoking about 7 years ago. Her smoking use included Cigarettes. She has a 20 pack-year smoking history. She has never used smokeless tobacco. She reports that she does not drink alcohol or use illicit drugs. Allergies  Allergen Reactions  . Ivp Dye [Iodinated Diagnostic Agents] Swelling  . Lisinopril Swelling  . Iohexol      Desc: itching, swelling    Prior to Admission medications   Medication Sig Start Date End Date Taking? Authorizing Provider  albuterol (PROVENTIL) (5 MG/ML) 0.5% nebulizer solution Take 2.5 mg by nebulization 3 (three) times daily.    Historical Provider, MD  albuterol (PROVENTIL) (5 MG/ML) 0.5% nebulizer solution Take 2.5 mg by nebulization every 2 (two) hours as needed for wheezing or shortness of breath.    Historical Provider, MD  aspirin 81 MG chewable tablet Chew 81 mg by mouth daily.    Historical Provider, MD  benzonatate (TESSALON) 200 MG capsule Take 1 capsule (200 mg total) by mouth 3 (three) times daily as needed for cough. For cough 07/06/12   Ripudeep K Rai, MD  budesonide (PULMICORT) 0.25 MG/2ML nebulizer solution Take 2 mLs (0.25 mg total) by nebulization 2 (two) times daily. 07/06/12   Ripudeep Jenna LuoK Rai, MD  calcium acetate (PHOSLO) 667 MG capsule Take 667 mg by mouth every morning.     Historical Provider, MD  Cholecalciferol (VITAMIN D) 2000 UNITS tablet Take 2,000 Units by mouth daily.    Historical Provider, MD  cinacalcet (SENSIPAR) 60 MG tablet Take 60 mg by mouth daily.    Historical Provider, MD  docusate sodium (COLACE) 100 MG capsule Take 100 mg by mouth 2 (two) times daily.     Historical Provider, MD   fluticasone (FLONASE) 50 MCG/ACT nasal spray Place 2 sprays into the nose daily as needed. For allergies    Historical Provider, MD  folic acid (FOLVITE) 1 MG tablet Take 1 mg by mouth daily.    Historical Provider, MD  furosemide (LASIX) 80 MG tablet Take 80 mg by mouth daily.     Historical Provider, MD  levofloxacin (LEVAQUIN) 500 MG tablet Take 1 tablet (500 mg total) by mouth every other day. 06/07/13   Flint MelterElliott L Wentz, MD  levothyroxine (SYNTHROID, LEVOTHROID) 137 MCG tablet Take 137 mcg by mouth daily.    Historical Provider, MD  loratadine (CLARITIN) 10 MG tablet Take 10 mg by mouth daily.    Historical Provider, MD  megestrol (MEGACE) 400 MG/10ML suspension Take 1,200 mg by mouth daily.     Historical Provider, MD  multivitamin (RENA-VIT) TABS tablet Take 1 tablet by mouth daily.    Historical Provider, MD  nitroGLYCERIN (NITROSTAT) 0.4 MG SL tablet Place 0.4 mg under the tongue every 5 (five) minutes as needed. For chest pain    Historical Provider, MD  olopatadine (PATANOL) 0.1 % ophthalmic solution Place 1 drop into both eyes daily as needed for allergies. For dry eyes    Historical Provider, MD  oxyCODONE (OXY IR/ROXICODONE) 5 MG immediate release tablet Take 5 mg by mouth every 4 (four) hours as needed. For pain    Historical Provider, MD  pantoprazole (PROTONIX) 40 MG tablet Take 1 tablet (40 mg total) by mouth daily. 07/06/12   Ripudeep Jenna LuoK Rai, MD  predniSONE (DELTASONE) 20 MG tablet Take 1 tablet (20 mg total) by mouth 2 (two) times daily. 06/07/13   Flint MelterElliott L Wentz, MD  promethazine (PHENERGAN) 25 MG tablet Take 25 mg by mouth every 6 (six) hours as needed. For nausea    Historical Provider, MD  senna (SENOKOT) 8.6 MG TABS Take 2 tablets (17.2 mg total) by mouth 2 (two) times daily. 07/06/12   Edsel PetrinElizabeth L Golding, DO  temazepam (RESTORIL) 7.5 MG capsule Take 1 capsule (7.5 mg total) by mouth at bedtime as needed for sleep. 07/06/12   Edsel PetrinElizabeth L Golding, DO  tiotropium (SPIRIVA) 18 MCG  inhalation capsule Place 1 capsule (18 mcg total) into inhaler and inhale daily. 06/26/11   Elease EtienneAnand D Hongalgi, MD   No current facility-administered medications for this encounter.   Labs: Basic Metabolic Panel: No results found for this basename: NA, K, CL, CO2, GLUCOSE, BUN, CREATININE, CALCIUM, ALB, PHOS,  in the last 168 hours Liver Function Tests: No results found for this basename: AST, ALT, ALKPHOS, BILITOT, PROT, ALBUMIN,  in the last 168 hours No results found for this basename: LIPASE, AMYLASE,  in the last 168 hours No results found for this basename: AMMONIA,  in the last 168 hours CBC: No results found for this basename: WBC, NEUTROABS, HGB, HCT, MCV, PLT,  in the last 168 hours Cardiac Enzymes: No results found for this basename: CKTOTAL, CKMB, CKMBINDEX, TROPONINI,  in the last 168 hours CBG: No results found for this basename: GLUCAP,  in the last 168 hours Iron Studies: No results found for this basename: IRON, TIBC, TRANSFERRIN, FERRITIN,  in the last 72 hours Studies/Results: No results found.  Review of Systems: Gen:  Reports fatigue, weakness and poor appetite. Denies any fever, chills, sweats, anorexia, malaise, weight loss, and sleep disorder HEENT: No visual complaints, No history of Retinopathy. Normal external appearance No Epistaxis or Sore throat. No sinusitis.   CV: Denies chest pain, angina, palpitations, syncope, orthopnea, PND, peripheral edema, and claudication. Resp: Reports occasional denies dyspnea at rest, dyspnea with exercise, does wear 02 prn at home. Denies sob at this time, cough, sputum, wheezing, coughing up blood, and pleurisy. GI: Reports intermittent nausea and vomiting and constipation. Denies vomiting blood, jaundice, and fecal incontinence.   Denies dysphagia or odynophagia. GU : Denies urinary burning, blood in urine, urinary frequency, urinary hesitancy, nocturnal urination, and urinary incontinence.  No renal calculi. MS: Reports back  pain. Denies joint pain, limitation of movement, and swelling, stiffness, extremity pain. Denies muscle weakness, cramps, atrophy.  No use of non steroidal antiinflammatory drugs. Derm: Denies rash, itching, dry skin, hives, moles, warts, or unhealing ulcers.  Psych: Denies depression, anxiety, memory loss, suicidal ideation, hallucinations, paranoia, and confusion. Heme: Denies bruising, bleeding, and enlarged lymph nodes. Neuro: No headache.  No diplopia. No dysarthria.  No dysphasia.  No paresthesias.  No weakness. Endocrine No DM.  No Thyroid disease.  No Adrenal disease.  Physical Exam: There were no vitals filed for this visit.   General: Chronically ill appearing, thin. in no acute distress. Head: Normocephalic, atraumatic, sclera non-icteric, mucus membranes are moist Neck: Supple. JVD not elevated. Lungs: . Diminished, scattered faint rhonchi. Breathing is unlabored. Heart: RRR with S1 S2. No murmurs, rubs, or gallops appreciated. Abdomen: Soft, non-tender, non-distended with normoactive bowel sounds. No rebound/guarding. No obvious abdominal masses. M-S:  Strength and tone appear normal for age. Lower extremities:without edema or ischemic changes, no open wounds  Neuro: Alert and oriented X 3. Moves all extremities spontaneously. Psych:  Responds to questions appropriately with a normal affect. Dialysis Access: L AVF +bruit/thrill  Dialysis Orders:  MWF @ [facility]  47.5kgs  3.5hr  2K/2Ca No Heparin 350/1.5   L AVF No hectorol Aranesp 40 q week   Venofer 50 q week Profile 2  Assessment/Plan: 1.  Weakness- workup per primary. [facility]F on [facility] at [facility]- Likely d/t missed HD 2. Prolonged bleeding from AVF- at outpt center. Has not had fistulogram, will try to get here. No heparin. 3.  ESRD -  MWF @ [facility]. Missed yesterday, HD pending today. K+5 4.  Hypertension/volume  - left under edw 6/10, lower at DC. Carvedilol 12.5 at home 5.  Anemia  - hgb 10.4 (6/10) last tsat 36. Cont Aranesp  and venofer q week, due 6/17 6.  Metabolic bone disease -  labs from 5/27- corrected Ca+ 9.1, phos 6.7. PTH 225. Cont phoslo and sensipar 7.  Nutrition - Alb 3.6 5/27nepro. multivit- renal diet when advanced 8. dispo- may benefit from palliative care consult given her  noncompliance   Jetty Duhamel, NP [employer] [phone] 11/13/2013, 4:42 PM   Renal Attending: Pt seen SOB due to inadequate dialysis due to her nonadhering to therapy.  Plan for dialysis tonight and reinforcement of compliance.  I agree with note above as articulated by Ms Barnetta Chapel with no additions. Olamide Lahaie C

## 2013-11-13 NOTE — Progress Notes (Signed)
Pt admitted to room 6E 29 from [facility] via care link.  VSS. Denies pain. Pleasant. Nephrology consulted plan on dialysis today.  Paged Dr. Janece CanterburyBuriev FYI pt admitted only had nephrology orders.

## 2013-11-13 NOTE — H&P (Addendum)
[facility] History and Physical  Maria LefevreVera M Ozaki ZOX:[id number] DOB: Jan 01, 1933 DOA: 11/13/2013  Referring physician: er PCP: Galvin ProfferHAGUE, IMRAN P, MD   Chief Complaint: weakness  HPI: [name] is a 80 y.o. female  with ESRD on HD and CHF . She presented at OSH with SOB and weakness.  Patient has been getting weak for last week.  No fever, no chills, no chest pain.  Patient does endorse constipation: +nausea and vomiting.  Has tried laxatives but still feels constipated Saw PCP yest and was given a "shot"- not sure what for and what it was Wears O2 when needed PRN- usually with activity- still smoking Last echo was 2014 and showed EF of 30%  At OSH: She was found to have CHF on x ray per ED likely due to missing HD.  (missed on Friday)  last HD was on Wednesday -VS at OSH: BP 13176, HR-76, sat 98 on 2LPM -labs: Creatinine-6.; K-5.0; Hg-10.3, WBC 8k    Review of Systems:  All systems reviewed, negative, unless stated above   Past Medical History  Diagnosis Date  . Carotid artery occlusion     Bilateral carotid artery disease  . Peripheral artery disease   . Arrhythmia     ? hx of atrial fibrillation  . CHF (congestive heart failure)     EF 25-30%.   . Hypertension   . Hyperlipidemia   . ESRD on hemodialysis 06/20/11    M, W, F; [address], [place].  Started HD in Sept 2008  . AAA (abdominal aortic aneurysm)   . Stroke ` 2007  . Duodenal ulcer 2008  . S/P CABG (coronary artery bypass graft)     CABG in 2008, had 3 MI's prior  . Depression   . Pneumonia   . COPD, severe   . Hypothyroidism   . Anemia   . Pacemaker    Past Surgical History  Procedure Laterality Date  . Thyroid surgery  2004  . Dg av dialysis  shunt access exist*l* or  03/2000    left upper arm  . Vaginal hysterectomy  1976  . Hemorrhoid surgery  1971  . Insert / replace / remove pacemaker  1990's    initial placement  . Insert / replace / remove pacemaker  05/2011    "changed out"  .  Incisional hernia repair  10/2010    incarcerated  . Common iliac  06/2009    bilateral kissing stents  . Pseudoaneurysm repair  09/1999    right groin  . Av fistula repair  04/2007    left upper arm  . Cardiac catheterization  02/10, 03/12    Most recent showed 3 vessel CAD with patent grafts: SVG to LAD, SVG to PDA and SVG to OM  . Coronary angioplasty with stent placement  09/1999    "1"  . Coronary artery bypass graft  02/2007    CABG X3   Social History:  reports that she quit smoking about 7 years ago. Her smoking use included Cigarettes. She has a 20 pack-year smoking history. She has never used smokeless tobacco. She reports that she does not drink alcohol or use illicit drugs.  Allergies  Allergen Reactions  . Ivp Dye [Iodinated Diagnostic Agents] Swelling  . Lisinopril Swelling  . Iohexol Itching and Swelling         No family history on file.   Prior to Admission medications   Medication Sig Start Date End Date Taking? Authorizing Provider  ALPRAZolam (  XANAX) 0.5 MG tablet Take 0.5 mg by mouth 3 (three) times daily as needed for anxiety.   Yes Historical Provider, MD  aspirin EC 81 MG tablet Take 81 mg by mouth daily.   Yes Historical Provider, MD  atorvastatin (LIPITOR) 80 MG tablet Take 80 mg by mouth daily.   Yes Historical Provider, MD  calcium acetate (PHOSLO) 667 MG capsule Take 667 mg by mouth 3 (three) times daily with meals.    Yes Historical Provider, MD  cholecalciferol (VITAMIN D) 1000 UNITS tablet Take 1,000 Units by mouth daily.   Yes Historical Provider, MD  cinacalcet (SENSIPAR) 60 MG tablet Take 60 mg by mouth daily.   Yes Historical Provider, MD  dextromethorphan (DELSYM) 30 MG/5ML liquid Take by mouth as needed for cough.   Yes Historical Provider, MD  docusate sodium (COLACE) 100 MG capsule Take 100 mg by mouth.    Yes Historical Provider, MD  folic acid (FOLVITE) 400 MCG tablet Take 400 mcg by mouth daily.   Yes Historical Provider, MD  hydrALAZINE  (APRESOLINE) 25 MG tablet Take 25 mg by mouth 3 (three) times daily.   Yes Historical Provider, MD  levothyroxine (SYNTHROID, LEVOTHROID) 150 MCG tablet Take 150 mcg by mouth daily before breakfast.   Yes Historical Provider, MD  ondansetron (ZOFRAN-ODT) 8 MG disintegrating tablet Take 8 mg by mouth 3 (three) times daily as needed for nausea or vomiting.   Yes Historical Provider, MD  oxyCODONE-acetaminophen (PERCOCET) 7.5-325 MG per tablet Take 1 tablet by mouth every 8 (eight) hours.   Yes Historical Provider, MD  temazepam (RESTORIL) 30 MG capsule Take 30 mg by mouth at bedtime.   Yes Historical Provider, MD  albuterol (PROVENTIL) (5 MG/ML) 0.5% nebulizer solution Take 2.5 mg by nebulization 3 (three) times daily.    Historical Provider, MD  albuterol (PROVENTIL) (5 MG/ML) 0.5% nebulizer solution Take 2.5 mg by nebulization every 2 (two) hours as needed for wheezing or shortness of breath.    Historical Provider, MD  benzonatate (TESSALON) 200 MG capsule Take 1 capsule (200 mg total) by mouth 3 (three) times daily as needed for cough. For cough 07/06/12   Ripudeep K Rai, MD  budesonide (PULMICORT) 0.25 MG/2ML nebulizer solution Take 2 mLs (0.25 mg total) by nebulization 2 (two) times daily. 07/06/12   Ripudeep Jenna LuoK Rai, MD  furosemide (LASIX) 80 MG tablet Take 80 mg by mouth daily.     Historical Provider, MD  levofloxacin (LEVAQUIN) 500 MG tablet Take 1 tablet (500 mg total) by mouth every other day. 06/07/13   Flint MelterElliott L Wentz, MD  loratadine (CLARITIN) 10 MG tablet Take 10 mg by mouth daily.    Historical Provider, MD  multivitamin (RENA-VIT) TABS tablet Take 1 tablet by mouth daily.    Historical Provider, MD  nitroGLYCERIN (NITROSTAT) 0.4 MG SL tablet Place 0.4 mg under the tongue every 5 (five) minutes as needed for chest pain.     Historical Provider, MD  olopatadine (PATANOL) 0.1 % ophthalmic solution Place 1 drop into both eyes daily as needed for allergies. For dry eyes    Historical Provider, MD    oxyCODONE (OXY IR/ROXICODONE) 5 MG immediate release tablet Take 5 mg by mouth every 4 (four) hours as needed. For pain    Historical Provider, MD  pantoprazole (PROTONIX) 40 MG tablet Take 1 tablet (40 mg total) by mouth daily. 07/06/12   Ripudeep Jenna LuoK Rai, MD  predniSONE (DELTASONE) 20 MG tablet Take 1 tablet (20 mg total) by  mouth 2 (two) times daily. 06/07/13   Flint Melter, MD  promethazine (PHENERGAN) 25 MG tablet Take 25 mg by mouth every 6 (six) hours as needed. For nausea    Historical Provider, MD  senna (SENOKOT) 8.6 MG TABS Take 2 tablets (17.2 mg total) by mouth 2 (two) times daily. 07/06/12   Edsel Petrin, DO  tiotropium (SPIRIVA) 18 MCG inhalation capsule Place 1 capsule (18 mcg total) into inhaler and inhale daily. 06/26/11   Elease Etienne, MD   Physical Exam: There were no vitals filed for this visit.  There were no vitals taken for this visit.  General:  Appears calm and comfortable Eyes: PERRL, normal lids, irises & conjunctiva ENT: grossly normal hearing, lips & tongue Neck: no LAD, masses or thyromegaly Cardiovascular: RRR, no m/r/g. No LE edema. Telemetry: SR, no arrhythmias  Respiratory: crackles at bases Normal respiratory effort. Abdomen: soft, ntnd Skin: no rash or induration seen on limited exam, left arm with large access for dialysis Musculoskeletal: grossly normal tone BUE/BLE Psychiatric: grossly normal mood and affect, speech fluent and appropriate Neurologic: grossly non-focal.          Labs on Admission:  Basic Metabolic Panel: No results found for this basename: NA, K, CL, CO2, GLUCOSE, BUN, CREATININE, CALCIUM, MG, PHOS,  in the last 168 hours Liver Function Tests: No results found for this basename: AST, ALT, ALKPHOS, BILITOT, PROT, ALBUMIN,  in the last 168 hours No results found for this basename: LIPASE, AMYLASE,  in the last 168 hours No results found for this basename: AMMONIA,  in the last 168 hours CBC: No results found for this  basename: WBC, NEUTROABS, HGB, HCT, MCV, PLT,  in the last 168 hours Cardiac Enzymes: No results found for this basename: CKTOTAL, CKMB, CKMBINDEX, TROPONINI,  in the last 168 hours  BNP (last 3 results) No results found for this basename: PROBNP,  in the last 8760 hours CBG: No results found for this basename: GLUCAP,  in the last 168 hours  Radiological Exams on Admission: No results found.  EKG: Independently reviewed. vertricularly paced  Assessment/Plan Active Problems:   ESRD (end stage renal disease) on dialysis   SOB (shortness of breath)   Pulmonary edema   Dementia   Tobacco abuse   Weakness   Weakness- ? Viral illness, no WBC count- hold on abx for now, TSH, cycle CE, monitor out of bed daily  N/V:?Constipation- abd x ray  SOB due to pulm edema due to missed dialysis- HD per nephro  Tobacco abuse- encourage cessation  Dementia- per history- patient appropriate  ESRD- HD M/W/F- per nephrology  nephrology  Code Status: full Family Communication: patient Disposition Plan: admit  Time spent: 75 min  Marlin Canary [facility] Pager [phone]  **Disclaimer: This note may have been dictated with voice recognition software. Similar sounding words can inadvertently be transcribed and this note may contain transcription errors which may not have been corrected upon publication of note.**

## 2013-11-14 ENCOUNTER — Inpatient Hospital Stay (HOSPITAL_COMMUNITY): Payer: Medicare HMO

## 2013-11-14 DIAGNOSIS — Z9119 Patient's noncompliance with other medical treatment and regimen: Secondary | ICD-10-CM

## 2013-11-14 DIAGNOSIS — R5383 Other fatigue: Secondary | ICD-10-CM

## 2013-11-14 DIAGNOSIS — R5381 Other malaise: Secondary | ICD-10-CM

## 2013-11-14 DIAGNOSIS — Z91199 Patient's noncompliance with other medical treatment and regimen due to unspecified reason: Secondary | ICD-10-CM

## 2013-11-14 DIAGNOSIS — E039 Hypothyroidism, unspecified: Secondary | ICD-10-CM | POA: Diagnosis present

## 2013-11-14 DIAGNOSIS — F17200 Nicotine dependence, unspecified, uncomplicated: Secondary | ICD-10-CM

## 2013-11-14 LAB — BASIC METABOLIC PANEL
BUN: 13 mg/dL (ref 6–23)
CALCIUM: 8.6 mg/dL (ref 8.4–10.5)
CO2: 29 mEq/L (ref 19–32)
CREATININE: 2.73 mg/dL — AB (ref 0.50–1.10)
Chloride: 94 mEq/L — ABNORMAL LOW (ref 96–112)
GFR calc non Af Amer: 15 mL/min — ABNORMAL LOW (ref >90)
GFR, EST AFRICAN AMERICAN: 18 mL/min — AB (ref >90)
GLUCOSE: 81 mg/dL (ref 70–99)
POTASSIUM: 3.6 meq/L — AB (ref 3.7–5.3)
Sodium: 140 mEq/L (ref 137–147)

## 2013-11-14 LAB — CBC
HEMATOCRIT: 32 % — AB (ref 36.0–46.0)
HEMOGLOBIN: 10 g/dL — AB (ref 12.0–15.0)
MCH: 31.7 pg (ref 26.0–34.0)
MCHC: 31.3 g/dL (ref 30.0–36.0)
MCV: 101.6 fL — AB (ref 78.0–100.0)
Platelets: 230 10*3/uL (ref 150–400)
RBC: 3.15 MIL/uL — ABNORMAL LOW (ref 3.87–5.11)
RDW: 15.7 % — AB (ref 11.5–15.5)
WBC: 7 10*3/uL (ref 4.0–10.5)

## 2013-11-14 LAB — TROPONIN I: Troponin I: <0.3 ng/mL (ref <0.30)

## 2013-11-14 MED ORDER — GUAIFENESIN 100 MG/5ML PO SOLN
5.0000 mL | ORAL | Status: DC | PRN
  Administered 2013-11-14 – 2013-11-15 (×3): 100 mg via ORAL
  Filled 2013-11-14 (×2): qty 5

## 2013-11-14 MED ORDER — FLEET ENEMA 7-19 GM/118ML RE ENEM
1.0000 | ENEMA | Freq: Once | RECTAL | Status: AC
  Administered 2013-11-14: 17:00:00 via RECTAL
  Filled 2013-11-14 (×2): qty 1

## 2013-11-14 MED ORDER — ALBUTEROL SULFATE (2.5 MG/3ML) 0.083% IN NEBU
2.5000 mg | INHALATION_SOLUTION | Freq: Two times a day (BID) | RESPIRATORY_TRACT | Status: DC
  Administered 2013-11-14 – 2013-11-16 (×3): 2.5 mg via RESPIRATORY_TRACT
  Filled 2013-11-14 (×3): qty 3

## 2013-11-14 MED ORDER — CARVEDILOL 12.5 MG PO TABS
12.5000 mg | ORAL_TABLET | Freq: Two times a day (BID) | ORAL | Status: DC
  Administered 2013-11-14 – 2013-11-16 (×4): 12.5 mg via ORAL
  Filled 2013-11-14 (×8): qty 1

## 2013-11-14 MED ORDER — IPRATROPIUM BROMIDE 0.02 % IN SOLN: 0.5000 mg | Freq: Two times a day (BID) | RESPIRATORY_TRACT | Status: DC | PRN

## 2013-11-14 MED ORDER — DIPHENHYDRAMINE HCL 50 MG PO CAPS
50.0000 mg | ORAL_CAPSULE | Freq: Once | ORAL | Status: DC
  Filled 2013-11-14: qty 1

## 2013-11-14 MED ORDER — PREDNISONE 50 MG PO TABS
50.0000 mg | ORAL_TABLET | Freq: Four times a day (QID) | ORAL | Status: DC
  Administered 2013-11-15: 50 mg via ORAL
  Filled 2013-11-14 (×3): qty 1

## 2013-11-14 NOTE — Progress Notes (Signed)
Patient [name]      DOB: 1932/06/19      GYJ:[id number]   Spoke with patient regarding goals of care . She was ok with me calling her daughter.  Patient known to ours service . Goals of care in January with Dr. Hilma Favors. Spoke with daughter [name]. She will be over tomorrow at 4 pm will continue our conversation.  It seems the patient changes her story from wants to continue dialysis to doesn't.  She as for medicine for her cough. Will order prn guaifenesin and met at 4 pm tomorrow.   Janiah Devinney L. Lovena Le, MD MBA The Palliative Medicine Team at [facility] Phone: [phone] Pager: [phone]

## 2013-11-14 NOTE — Progress Notes (Signed)
[facility] PROGRESS NOTE  Jacalyn LefevreVera M Vanbeek MVH:[id number] DOB: 1932/09/27 DOA: 11/13/2013 PCP: Galvin ProfferHAGUE, IMRAN P, MD  Assessment/Plan: Active Problems:   Hypertension: Blood pressure stable.    ESRD (end stage renal disease) on dialysis: Status post HD yesterday. Issue is that patient intermittently misses dialysis because she did not really like it and wears her out. She feels horrible afterwards. Palliative care has been consulted for goals of care. We'll attempt to recheck patient's daughter and try to set up needing. In discussion with the patient, at it sounds as if she really does not want to be on dialysis anymore.    SOB (shortness of breath): Secondary to pulmonary edema.    Pulmonary edema: Secondary to volume overload from missing dialysis. Treated with HD.    Dementia: Spoke with patient's nurse was actually related to her and and says that while she gets confused when she skips dialysis, the patient actually is very alert and oriented, but very stubborn.    Patient nonadherence: See above    Tobacco abuse: Declined nicotine patch    Weakness: Secondary to missing dialysis   Unspecified hypothyroidism   Code Status: Full code Family Communication: Left message with daughter  Disposition Plan: Doing better. Home after goals of care decided   Consultants:  Nephrology  Palliative medicine  Procedures:  Status post hemodialysis on 6/13  Antibiotics:  None  HPI/Subjective: Patient feeling very tired. Mildly nauseated. Mildly constipated.  Objective: Filed Vitals:   11/14/13 0952  BP: 133/73  Pulse: 76  Temp: 97.7 F (36.5 C)  Resp: 17    Intake/Output Summary (Last 24 hours) at 11/14/13 1220 Last data filed at 11/13/13 2340  Gross per 24 hour  Intake      3 ml  Output   2200 ml  Net  -2197 ml   Filed Weights   11/13/13 1910 11/13/13 2315 11/14/13 0528  Weight: 47.6 kg (104 lb 15 oz) 46.2 kg (101 lb 13.6 oz) 44.9 kg (98 lb 15.8 oz)     Exam:   General:  Alert and oriented x3, fatigue  Cardiovascular: Regular rate and rhythm, S1-S2, soft 3/6 systolic ejection murmur  Respiratory: Clear to auscultation bilaterally, poor inspiratory effort  Abdomen: Soft, nontender, nondistended, hypoactive bowel sounds  Musculoskeletal: No clubbing or cyanosis, trace edema   Data Reviewed: Basic Metabolic Panel:  Recent Labs Lab 11/13/13 1915 11/14/13 0545  NA 138 140  K 5.1 3.6*  CL 93* 94*  CO2 24 29  GLUCOSE 92 81  BUN 50* 13  CREATININE 6.39* 2.73*  CALCIUM 7.7* 8.6  PHOS 6.3*  --    Liver Function Tests:  Recent Labs Lab 11/13/13 1915  ALBUMIN 2.5*   No results found for this basename: LIPASE, AMYLASE,  in the last 168 hours No results found for this basename: AMMONIA,  in the last 168 hours CBC:  Recent Labs Lab 11/13/13 1915 11/14/13 0545  WBC 8.2 7.0  HGB 9.8* 10.0*  HCT 31.1* 32.0*  MCV 99.7 101.6*  PLT 253 230   Cardiac Enzymes:  Recent Labs Lab 11/13/13 1749 11/14/13 0102 11/14/13 0545  TROPONINI <0.30 <0.30 <0.30   BNP (last 3 results) No results found for this basename: PROBNP,  in the last 8760 hours CBG: No results found for this basename: GLUCAP,  in the last 168 hours  No results found for this or any previous visit (from the past 240 hour(s)).   Studies: Dg Abd Portable 2v  11/14/2013   CLINICAL DATA:  Abdominal pain.  Constipation .  EXAM: PORTABLE ABDOMEN - 2 VIEW  COMPARISON:  CT 06/29/2013  FINDINGS: Soft tissue structures are unremarkable. Moderate amount of stool noted: Marland Kitchen. The gas pattern is nonspecific. Surgical clips right upper quadrant. Aortoiliac and visceral atherosclerotic vascular disease. Bilateral iliac stents are noted. Pelvic phleboliths. Degenerative changes lumbar spine and both hips. Cardiomegaly. Cardiac pacer.  IMPRESSION: 1. Moderate amount of stool in colon.  No bowel distention. 2. Aortoiliac atherosclerotic vascular disease. Bilateral iliac stents  are present. Visceral atherosclerotic vascular disease. 3. Cholecystectomy. 4. Cardiomegaly.  Cardiac pacer.   Electronically Signed   By: Maisie Fushomas  Register   On: 11/14/2013 08:04    Scheduled Meds: . albuterol  2.5 mg Nebulization BID  . aspirin EC  81 mg Oral Daily  . atorvastatin  80 mg Oral q1800  . budesonide  0.25 mg Nebulization BID  . calcium acetate  3,335 mg Oral TID WC  . carvedilol  12.5 mg Oral BID WC  . cholecalciferol  1,000 Units Oral Daily  . cinacalcet  60 mg Oral Q supper  . [START ON 11/17/2013] darbepoetin (ARANESP) injection - DIALYSIS  40 mcg Intravenous Q Wed-HD  . [START ON 11/15/2013] diphenhydrAMINE  50 mg Oral Once  . [START ON 11/17/2013] ferric gluconate (FERRLECIT/NULECIT) IV  62.5 mg Intravenous Q Wed-HD  . heparin  5,000 Units Subcutaneous 3 times per day  . levothyroxine  150 mcg Oral QAC breakfast  . loratadine  10 mg Oral Daily  . multivitamin  1 tablet Oral QHS  . oxyCODONE-acetaminophen  1 tablet Oral 3 times per day   And  . oxyCODONE  2.5 mg Oral 3 times per day  . pantoprazole  40 mg Oral Daily  . [START ON 11/15/2013] predniSONE  50 mg Oral Q6H  . senna  2 tablet Oral BID  . sodium chloride  3 mL Intravenous Q12H  . temazepam  30 mg Oral QHS  . tiotropium  18 mcg Inhalation Daily   Continuous Infusions:   Active Problems:   Hypertension   ESRD (end stage renal disease) on dialysis   SOB (shortness of breath)   Pulmonary edema   Dementia   Patient nonadherence   Tobacco abuse   Weakness   Unspecified hypothyroidism    Time spent: 15 minutes    Hollice EspyKRISHNAN,Juliett Eastburn K  [facility] Pager [phone]. If 7PM-7AM, please contact night-coverage at [email or web address], password [email or web address] 11/14/2013, 12:20 PM  LOS: 1 day

## 2013-11-14 NOTE — Progress Notes (Signed)
A Palliative Medicine Consult has been received for Patient WU:JWJX:[name]      DOB: 05/17/1933      BJY:[id number].We are attempting to respond as quickly as possible to your patient and families needs, however, due to high volume  we have not been able to service you in a more timely manner.   We will get this scheduled as quickly as possible.  If in the meantime we can assist with symptom management or other questions via phone please do not hesitate to call our PMT cell phone [phone].   We are sorry for the delay,  Maria Macnaughton L. Ladona Ridgelaylor, MD MBA The Palliative Medicine Team at [facility] Team Phone: [phone] Pager: [phone]

## 2013-11-14 NOTE — Progress Notes (Signed)
Subjective:   Having some sob and cough, about the same as it has been. Has not eaten breakfast. No new complaints  Objective Filed Vitals:   11/13/13 2230 11/13/13 2252 11/13/13 2315 11/14/13 0528  BP: 96/49 104/47 112/57 120/68  Pulse: 75 75 80 75  Temp:  97.8 F (36.6 C) 98.4 F (36.9 C) 98.6 F (37 C)  TempSrc:  Oral Oral Oral  Resp:  15 16 18   Height:      Weight:   46.2 kg (101 lb 13.6 oz) 44.9 kg (98 lb 15.8 oz)  SpO2:  98% 94% 96%   Physical Exam General: chronically ill. No acute distress. Heart: RRR, pacer Lungs: Dim, faint crackles bilat bases, frequent dry cough Abdomen: soft, nontender +BS Extremities: no edema Dialysis Access: LUA AVF +bruit/thrill  Dialysis Orders: MWF @ [facility]  47.5kgs 3.5hr 2K/2Ca No Heparin 350/1.5 L AVF  No hectorol Aranesp 40 q week Venofer 50 q week  Profile 2  Assessment/Plan:  1. Weakness- workup per primary. [facility]- Likely d/t missed HD, repeat chest xray 2. Abdominal pain- constipation on xray 3. Prolonged bleeding from AVF- at outpt center. Has not had fistulogram, will try to get here. No heparin. 4. ESRD - MWF @ [facility].  HD last night, 2.2L removed pending again tomorrow.  5. Hypertension/volume - 120/68 under edw, lower at [facility]. Carvedilol 12.5 at home 6. Anemia - hgb 10 last tsat 36. Cont Aranesp and venofer q week, due 6/17 7. Metabolic bone disease - Ca+ 8.6 Phos 6.3labs from 5/27- corrected Ca+ 9.1, phos 6.7. PTH 225. Cont phoslo and sensipar 8. Nutrition - Alb 2.5 nepro. multivit- renal diet when advanced 9. dispo- may benefit from palliative care consult given her noncompliance   Jetty DuhamelBridget Whelan, NP [facility] Beeper [phone] 11/14/2013,9:13 AM  LOS: 1 day    Additional Objective Labs: Basic Metabolic Panel:  Recent Labs Lab 11/13/13 1915 11/14/13 0545  NA 138 140  K 5.1 3.6*  CL 93* 94*  CO2 24 29  GLUCOSE 92 81  BUN 50* 13  CREATININE 6.39* 2.73*  CALCIUM 7.7* 8.6   PHOS 6.3*  --    Liver Function Tests:  Recent Labs Lab 11/13/13 1915  ALBUMIN 2.5*   No results found for this basename: LIPASE, AMYLASE,  in the last 168 hours CBC:  Recent Labs Lab 11/13/13 1915 11/14/13 0545  WBC 8.2 7.0  HGB 9.8* 10.0*  HCT 31.1* 32.0*  MCV 99.7 101.6*  PLT 253 230   Blood Culture No results found for this basename: sdes, specrequest, cult, reptstatus    Cardiac Enzymes:  Recent Labs Lab 11/13/13 1749 11/14/13 0102 11/14/13 0545  TROPONINI <0.30 <0.30 <0.30   CBG: No results found for this basename: GLUCAP,  in the last 168 hours Iron Studies: No results found for this basename: IRON, TIBC, TRANSFERRIN, FERRITIN,  in the last 72 hours @lablastinr3 @ Studies/Results: Dg Abd Portable 2v  11/14/2013   CLINICAL DATA:  Abdominal pain.  Constipation .  EXAM: PORTABLE ABDOMEN - 2 VIEW  COMPARISON:  CT 06/29/2013  FINDINGS: Soft tissue structures are unremarkable. Moderate amount of stool noted: Marland Kitchen. The gas pattern is nonspecific. Surgical clips right upper quadrant. Aortoiliac and visceral atherosclerotic vascular disease. Bilateral iliac stents are noted. Pelvic phleboliths. Degenerative changes lumbar spine and both hips. Cardiomegaly. Cardiac pacer.  IMPRESSION: 1. Moderate amount of stool in colon.  No bowel distention. 2. Aortoiliac atherosclerotic vascular disease. Bilateral iliac stents are present. Visceral  atherosclerotic vascular disease. 3. Cholecystectomy. 4. Cardiomegaly.  Cardiac pacer.   Electronically Signed   By: Maisie Fushomas  Register   On: 11/14/2013 08:04   Medications:   . albuterol  2.5 mg Nebulization BID  . aspirin EC  81 mg Oral Daily  . atorvastatin  80 mg Oral q1800  . budesonide  0.25 mg Nebulization BID  . calcium acetate  3,335 mg Oral TID WC  . cholecalciferol  1,000 Units Oral Daily  . cinacalcet  60 mg Oral Q supper  . [START ON 11/17/2013] darbepoetin (ARANESP) injection - DIALYSIS  40 mcg Intravenous Q Wed-HD  . [START ON  11/17/2013] ferric gluconate (FERRLECIT/NULECIT) IV  62.5 mg Intravenous Q Wed-HD  . furosemide  80 mg Oral Daily  . heparin  5,000 Units Subcutaneous 3 times per day  . hydrALAZINE  25 mg Oral TID  . levothyroxine  150 mcg Oral QAC breakfast  . loratadine  10 mg Oral Daily  . multivitamin  1 tablet Oral QHS  . oxyCODONE-acetaminophen  1 tablet Oral 3 times per day   And  . oxyCODONE  2.5 mg Oral 3 times per day  . pantoprazole  40 mg Oral Daily  . senna  2 tablet Oral BID  . sodium chloride  3 mL Intravenous Q12H  . temazepam  30 mg Oral QHS  . tiotropium  18 mcg Inhalation Daily   I have seen and examined this patient and agree with plan per Jetty DuhamelBridget Whelan.  Her under dialysis is certainly playing a role in her weakness.  She says she wants to cont HD though her behavior of non compliance is contradictory to that.  Plan HD tomorrow. TSH sl high, she may benefit from increase synthroid dose. Demarco Bacci T,MD 11/14/2013 9:40 AM

## 2013-11-15 LAB — CBC
HCT: 32.2 % — ABNORMAL LOW (ref 36.0–46.0)
Hemoglobin: 9.9 g/dL — ABNORMAL LOW (ref 12.0–15.0)
MCH: 31.4 pg (ref 26.0–34.0)
MCHC: 30.7 g/dL (ref 30.0–36.0)
MCV: 102.2 fL — ABNORMAL HIGH (ref 78.0–100.0)
Platelets: 224 10*3/uL (ref 150–400)
RBC: 3.15 MIL/uL — AB (ref 3.87–5.11)
RDW: 15.5 % (ref 11.5–15.5)
WBC: 6.1 10*3/uL (ref 4.0–10.5)

## 2013-11-15 LAB — RENAL FUNCTION PANEL
Albumin: 2.1 g/dL — ABNORMAL LOW (ref 3.5–5.2)
BUN: 21 mg/dL (ref 6–23)
CO2: 30 meq/L (ref 19–32)
Calcium: 8.6 mg/dL (ref 8.4–10.5)
Chloride: 96 mEq/L (ref 96–112)
Creatinine, Ser: 4.15 mg/dL — ABNORMAL HIGH (ref 0.50–1.10)
GFR, EST AFRICAN AMERICAN: 11 mL/min — AB (ref >90)
GFR, EST NON AFRICAN AMERICAN: 9 mL/min — AB (ref >90)
GLUCOSE: 79 mg/dL (ref 70–99)
POTASSIUM: 4.2 meq/L (ref 3.7–5.3)
Phosphorus: 5.9 mg/dL — ABNORMAL HIGH (ref 2.3–4.6)
SODIUM: 137 meq/L (ref 137–147)

## 2013-11-15 MED ORDER — HEPARIN SODIUM (PORCINE) 1000 UNIT/ML DIALYSIS: 1000.0000 [IU] | INTRAMUSCULAR | Status: DC | PRN

## 2013-11-15 MED ORDER — LIDOCAINE-PRILOCAINE 2.5-2.5 % EX CREA
1.0000 "application " | TOPICAL_CREAM | CUTANEOUS | Status: DC | PRN
  Filled 2013-11-15: qty 5

## 2013-11-15 MED ORDER — SODIUM CHLORIDE 0.9 % IV SOLN: 100.0000 mL | INTRAVENOUS | Status: DC | PRN

## 2013-11-15 MED ORDER — PENTAFLUOROPROP-TETRAFLUOROETH EX AERO: 1.0000 "application " | INHALATION_SPRAY | CUTANEOUS | Status: DC | PRN

## 2013-11-15 MED ORDER — ALTEPLASE 2 MG IJ SOLR
2.0000 mg | Freq: Once | INTRAMUSCULAR | Status: DC | PRN
  Filled 2013-11-15: qty 2

## 2013-11-15 MED ORDER — AMMONIUM LACTATE 12 % EX LOTN
TOPICAL_LOTION | Freq: Every day | CUTANEOUS | Status: DC
  Administered 2013-11-15 – 2013-11-16 (×2): via TOPICAL
  Filled 2013-11-15: qty 400

## 2013-11-15 MED ORDER — SALINE SPRAY 0.65 % NA SOLN
1.0000 | NASAL | Status: DC | PRN
  Filled 2013-11-15: qty 44

## 2013-11-15 MED ORDER — PREDNISONE 50 MG PO TABS
50.0000 mg | ORAL_TABLET | Freq: Four times a day (QID) | ORAL | Status: AC
  Administered 2013-11-15 – 2013-11-16 (×3): 50 mg via ORAL
  Filled 2013-11-15 (×4): qty 1

## 2013-11-15 MED ORDER — PRAMOXINE-ZINC OXIDE IN MO 1-12.5 % RE OINT
TOPICAL_OINTMENT | Freq: Two times a day (BID) | RECTAL | Status: DC | PRN
  Administered 2013-11-15: 21:00:00 via RECTAL
  Filled 2013-11-15: qty 28.3

## 2013-11-15 MED ORDER — SENNA 8.6 MG PO TABS: 2.0000 | ORAL_TABLET | Freq: Every day | ORAL | Status: DC

## 2013-11-15 MED ORDER — NEPRO/CARBSTEADY PO LIQD
237.0000 mL | ORAL | Status: DC | PRN
  Filled 2013-11-15: qty 237

## 2013-11-15 MED ORDER — LIDOCAINE HCL (PF) 1 % IJ SOLN: 5.0000 mL | INTRAMUSCULAR | Status: DC | PRN

## 2013-11-15 MED ORDER — PHENYLEPH-SHARK LIV OIL-MO-PET 0.25-3-14-71.9 % RE OINT
TOPICAL_OINTMENT | Freq: Two times a day (BID) | RECTAL | Status: DC | PRN
  Filled 2013-11-15 (×2): qty 28.4

## 2013-11-15 MED ORDER — DIPHENHYDRAMINE HCL 50 MG PO CAPS
50.0000 mg | ORAL_CAPSULE | Freq: Once | ORAL | Status: AC
  Administered 2013-11-16: 50 mg via ORAL
  Filled 2013-11-15: qty 1

## 2013-11-15 NOTE — Progress Notes (Signed)
Hemodialysis-Pt signed off treatment with remaining. Risks explained. C.Lyles PA notified.

## 2013-11-15 NOTE — Progress Notes (Signed)
[facility] PROGRESS NOTE  [name] DJS:[id number] DOB: [birth year] DOA: 11/13/2013 PCP: Bonnita Nasuti, MD  Assessment/Plan: Active Problems:   Hypertension: Blood pressure stable.    ESRD (end stage renal disease) on dialysis: Status post HD yesterday. Issue is that patient intermittently misses dialysis because she did not really like it and wears her out. She feels horrible afterwards. Palliative care has been consulted for goals of care, but after meeting, patient doesn't really want to stop dialysis, she just does not like the effects. She still wishes to have everything done.    SOB (shortness of breath): Secondary to pulmonary edema.    Pulmonary edema: Secondary to volume overload from missing dialysis. Treated with HD.    Dementia: Spoke with patient's nurse was actually related to her and and says that while she gets confused when she skips dialysis, the patient actually is very alert and oriented, but very stubborn.    Patient nonadherence: See above    Tobacco abuse: Declined nicotine patch    Weakness: Secondary to missing dialysis   Unspecified hypothyroidism   Code Status: Full code Family Communication: Daughter met with palliative care  Disposition Plan: Home tomorrow   Consultants:  Nephrology  Palliative medicine  Procedures:  Status post hemodialysis on 6/13  Antibiotics:  None  HPI/Subjective: Patient still feels rough today, nauseated after dialysis  Objective: Filed Vitals:   11/15/13 1400  BP: 108/61  Pulse: 85  Temp: 98.4 F (36.9 C)  Resp: 16    Intake/Output Summary (Last 24 hours) at 11/15/13 1841 Last data filed at 11/15/13 1034  Gross per 24 hour  Intake      0 ml  Output   1359 ml  Net  -1359 ml   Filed Weights   11/14/13 2208 11/15/13 0720 11/15/13 1034  Weight: 46.1 kg (101 lb 10.1 oz) 45.2 kg (99 lb 10.4 oz) 44 kg (97 lb)    Exam:   General:  Alert and oriented x3, fatigued  Cardiovascular: Regular  rate and rhythm, Y8-F0, soft 3/6 systolic ejection murmur  Respiratory: Clear to auscultation bilaterally, poor inspiratory effort  Abdomen: Soft, nontender, nondistended, hypoactive bowel sounds  Musculoskeletal: No clubbing or cyanosis, trace edema   Data Reviewed: Basic Metabolic Panel:  Recent Labs Lab 11/13/13 1915 11/14/13 0545 11/15/13 0725  NA 138 140 137  K 5.1 3.6* 4.2  CL 93* 94* 96  CO2 $Re'24 29 30  'MKK$ GLUCOSE 92 81 79  BUN 50* 13 21  CREATININE 6.39* 2.73* 4.15*  CALCIUM 7.7* 8.6 8.6  PHOS 6.3*  --  5.9*   Liver Function Tests:  Recent Labs Lab 11/13/13 1915 11/15/13 0725  ALBUMIN 2.5* 2.1*   No results found for this basename: LIPASE, AMYLASE,  in the last 168 hours No results found for this basename: AMMONIA,  in the last 168 hours CBC:  Recent Labs Lab 11/13/13 1915 11/14/13 0545 11/15/13 0720  WBC 8.2 7.0 6.1  HGB 9.8* 10.0* 9.9*  HCT 31.1* 32.0* 32.2*  MCV 99.7 101.6* 102.2*  PLT 253 230 224   Cardiac Enzymes:  Recent Labs Lab 11/13/13 1749 11/14/13 0102 11/14/13 0545  TROPONINI <0.30 <0.30 <0.30   BNP (last 3 results) No results found for this basename: PROBNP,  in the last 8760 hours CBG: No results found for this basename: GLUCAP,  in the last 168 hours  No results found for this or any previous visit (from the past 240 hour(s)).   Studies: Dg Chest Port 1  View  11/14/2013   CLINICAL DATA:  Shortness of breath.  Cough.  EXAM: PORTABLE CHEST - 1 VIEW  COMPARISON:  Chest x-ray 11/13/2013.  FINDINGS: Cardiomegaly with pulmonary vascular prominence and interstitial prominence present consistent with congestive heart failure with pulmonary interstitial edema. Pneumonitis cannot be excluded. Cardiac pacer with lead tips in right atrium and right ventricle. Prior CABG. No pneumothorax. No acute bony abnormality.  IMPRESSION: Congestive heart failure with pulmonary interstitial edema.   Electronically Signed   By: Marcello Moores  Register   On:  11/14/2013 14:47   Dg Abd Portable 2v  11/14/2013   CLINICAL DATA:  Abdominal pain.  Constipation .  EXAM: PORTABLE ABDOMEN - 2 VIEW  COMPARISON:  CT 06/29/2013  FINDINGS: Soft tissue structures are unremarkable. Moderate amount of stool noted: Marland Kitchen The gas pattern is nonspecific. Surgical clips right upper quadrant. Aortoiliac and visceral atherosclerotic vascular disease. Bilateral iliac stents are noted. Pelvic phleboliths. Degenerative changes lumbar spine and both hips. Cardiomegaly. Cardiac pacer.  IMPRESSION: 1. Moderate amount of stool in colon.  No bowel distention. 2. Aortoiliac atherosclerotic vascular disease. Bilateral iliac stents are present. Visceral atherosclerotic vascular disease. 3. Cholecystectomy. 4. Cardiomegaly.  Cardiac pacer.   Electronically Signed   By: Marcello Moores  Register   On: 11/14/2013 08:04    Scheduled Meds: . albuterol  2.5 mg Nebulization BID  . ammonium lactate   Topical Daily  . aspirin EC  81 mg Oral Daily  . atorvastatin  80 mg Oral q1800  . budesonide  0.25 mg Nebulization BID  . calcium acetate  3,335 mg Oral TID WC  . carvedilol  12.5 mg Oral BID WC  . cholecalciferol  1,000 Units Oral Daily  . cinacalcet  60 mg Oral Q supper  . [START ON 11/17/2013] darbepoetin (ARANESP) injection - DIALYSIS  40 mcg Intravenous Q Wed-HD  . [START ON 11/16/2013] diphenhydrAMINE  50 mg Oral Once  . [START ON 11/17/2013] ferric gluconate (FERRLECIT/NULECIT) IV  62.5 mg Intravenous Q Wed-HD  . heparin  5,000 Units Subcutaneous 3 times per day  . levothyroxine  150 mcg Oral QAC breakfast  . loratadine  10 mg Oral Daily  . multivitamin  1 tablet Oral QHS  . oxyCODONE-acetaminophen  1 tablet Oral 3 times per day   And  . oxyCODONE  2.5 mg Oral 3 times per day  . pantoprazole  40 mg Oral Daily  . predniSONE  50 mg Oral Q6H  . senna  2 tablet Oral BID  . sodium chloride  3 mL Intravenous Q12H  . temazepam  30 mg Oral QHS  . tiotropium  18 mcg Inhalation Daily   Continuous  Infusions:   Active Problems:   Hypertension   ESRD (end stage renal disease) on dialysis   SOB (shortness of breath)   Pulmonary edema   Dementia   Patient nonadherence   Tobacco abuse   Weakness   Unspecified hypothyroidism    Time spent: 15 minutes    [facility] Pager [phone]. If 7PM-7AM, please contact night-coverage at [email or web address], password [facility] 11/15/2013, 6:41 PM  LOS: 2 days

## 2013-11-15 NOTE — Progress Notes (Signed)
Subjective:  Seen on dialysis, feels "terrible", wants to sign off 15 mins early  Objective: Vital signs in last 24 hours: Temp:  [98 F (36.7 C)-98.2 F (36.8 C)] 98 F (36.7 C) (06/15 0720) Pulse Rate:  [76-84] 78 (06/15 1029) Resp:  [18-19] 19 (06/15 0720) BP: (87-113)/(46-73) 94/56 mmHg (06/15 1029) SpO2:  [95 %-96 %] 96 % (06/15 0720) Weight:  [45.2 kg (99 lb 10.4 oz)-46.1 kg (101 lb 10.1 oz)] 45.2 kg (99 lb 10.4 oz) (06/15 0720) Weight change: -2.7 kg (-5 lb 15.2 oz)  Intake/Output from previous day:   Intake/Output this shift: Total I/O In: -  Out: 1359 [Other:1359]  Lab Results:  Recent Labs  11/14/13 0545 11/15/13 0720  WBC 7.0 6.1  HGB 10.0* 9.9*  HCT 32.0* 32.2*  PLT 230 224   BMET:  Recent Labs  11/13/13 1915 11/14/13 0545 11/15/13 0725  NA 138 140 137  K 5.1 3.6* 4.2  CL 93* 94* 96  CO2 24 29 30   GLUCOSE 92 81 79  BUN 50* 13 21  CREATININE 6.39* 2.73* 4.15*  CALCIUM 7.7* 8.6 8.6  ALBUMIN 2.5*  --  2.1*   No results found for this basename: PTH,  in the last 72 hours Iron Studies: No results found for this basename: IRON, TIBC, TRANSFERRIN, FERRITIN,  in the last 72 hours  Studies/Results: Dg Chest Port 1 View  11/14/2013   CLINICAL DATA:  Shortness of breath.  Cough.  EXAM: PORTABLE CHEST - 1 VIEW  COMPARISON:  Chest x-ray 11/13/2013.  FINDINGS: Cardiomegaly with pulmonary vascular prominence and interstitial prominence present consistent with congestive heart failure with pulmonary interstitial edema. Pneumonitis cannot be excluded. Cardiac pacer with lead tips in right atrium and right ventricle. Prior CABG. No pneumothorax. No acute bony abnormality.  IMPRESSION: Congestive heart failure with pulmonary interstitial edema.   Electronically Signed   By: Maisie Fushomas  Register   On: 11/14/2013 14:47   Dg Abd Portable 2v  11/14/2013   CLINICAL DATA:  Abdominal pain.  Constipation .  EXAM: PORTABLE ABDOMEN - 2 VIEW  COMPARISON:  CT 06/29/2013  FINDINGS:  Soft tissue structures are unremarkable. Moderate amount of stool noted: Marland Kitchen. The gas pattern is nonspecific. Surgical clips right upper quadrant. Aortoiliac and visceral atherosclerotic vascular disease. Bilateral iliac stents are noted. Pelvic phleboliths. Degenerative changes lumbar spine and both hips. Cardiomegaly. Cardiac pacer.  IMPRESSION: 1. Moderate amount of stool in colon.  No bowel distention. 2. Aortoiliac atherosclerotic vascular disease. Bilateral iliac stents are present. Visceral atherosclerotic vascular disease. 3. Cholecystectomy. 4. Cardiomegaly.  Cardiac pacer.   Electronically Signed   By: Maisie Fushomas  Register   On: 11/14/2013 08:04   EXAM: General appearance:  Chronically ill, alert, in no apparent distress Resp:  CTA without rales, rhonchi, or wheezes Cardio:  RRR with Gr II/VI systolic murmur, no rub GI: + BS, soft and nontender Extremities:  No edema Access: AVF @ LUA with BFR 350  Dialysis Orders: MWF @ [facility]  47.5kgs 3.5hr 2K/2Ca No Heparin 350/1.5 L AVF  No hectorol Aranesp 40 q week Venofer 50 q week  Profile 2  Assessment/Plan: 1. Weakness - pulmonary edema per CXR @ [facility] sec to missed Txs.  2. Abdominal pain - x-ray yesterday showed only constipation. 3. Prolonged bleeding from AVF - for 2 wks @ center, no problems at hospital.  Fistulogram delayed until tomorrow, requires prep for contrast dye allergy. 4. ESRD - HD on NWF @ [facility], K 4.2.  HD today. 5. HTN/Volume -  BP 115/55 on Carvedilol; lower EDW at discharge. 6. Anemia - Hgb 9.9 on Aranesp 40 mcg & Fe on Wed. 7. Sec HPT - Ca 8.6 (10.1 corrected), P 5.9; Sensipar 60 mg qd, Phoslo 4 with meals. 8. Nutrition - Alb 2.1, renal diet & vitamin.   LOS: 2 days   LYLES,CHARLES 11/15/2013,10:56 AM  Pt seen, examined and agree w A/P as above.  Vinson Moselleob Keimani Laufer MD pager [phone]    cell [phone] 11/15/2013, 1:43 PM

## 2013-11-15 NOTE — Consult Note (Signed)
Patient [name]      DOB: 12-22-1932      OIN:[id number]  Summary of Goals of care; full note to follow:  Met with patient and her two daughters Helene Kelp and Hilda Blades  Patient states she feels generally bad all the time but in particular this last two weeks.  Patient has strong opinions about her dialysis treatments- feels they take too much off, feels that some nurses don't know how to access her graft .  She admits she is stubborn and that she will only go to early am session not mid day etc.  Overall, patient is apathetic about trying to make her circumstances better.  She admits that she has a lot to live for and so she still wants to do dialysis but in general she is miserable when doing her treatments.  She has a lot of unattended to symptoms that we will attempt to correct namely constipation, severely dry skin, congested sinuses, rectal pain related to constipation.    Attempted to help her understand her current treatments and put them into perspective:  Rec:  1.  Full code: patient clear that she desires this intervention even though she is declining and feeling poorly, POA is daughter [name]  2.  Order geomat or gel pad to help her be more comfortable at dialysis  3. Congested sinus: nasal saline will be ordered, claritin in place  4.  Constipation: does not do well with colace or miralax. And states that she has had those shots and they do nothing.  Starte senna.  Add prep H for rectal trauma  5. Cough: add flutter valve, contiue cough medicine,  6.  Severely dry skin: lachydrin daily  Total time :  400 pm- 5 23 pm   Taralyn Ferraiolo L. Lovena Le, MD MBA The Palliative Medicine Team at [facility] Phone: [phone] Pager: [phone]

## 2013-11-16 ENCOUNTER — Inpatient Hospital Stay (HOSPITAL_COMMUNITY): Payer: Medicare HMO

## 2013-11-16 DIAGNOSIS — E039 Hypothyroidism, unspecified: Secondary | ICD-10-CM

## 2013-11-16 MED ORDER — AMMONIUM LACTATE 12 % EX LOTN: TOPICAL_LOTION | Freq: Every day | CUTANEOUS | Status: DC

## 2013-11-16 MED ORDER — BOOST / RESOURCE BREEZE PO LIQD
1.0000 | Freq: Two times a day (BID) | ORAL | Status: DC
  Administered 2013-11-16: 1 via ORAL

## 2013-11-16 MED ORDER — IOHEXOL 300 MG/ML  SOLN
100.0000 mL | Freq: Once | INTRAMUSCULAR | Status: AC | PRN
  Administered 2013-11-16: 50 mL via INTRAVENOUS

## 2013-11-16 MED ORDER — BOOST / RESOURCE BREEZE PO LIQD: 1.0000 | Freq: Two times a day (BID) | ORAL | Status: DC

## 2013-11-16 MED ORDER — PRAMOXINE-ZINC OXIDE IN MO 1-12.5 % RE OINT: TOPICAL_OINTMENT | Freq: Two times a day (BID) | RECTAL | Status: AC | PRN

## 2013-11-16 NOTE — Progress Notes (Signed)
INITIAL NUTRITION ASSESSMENT  DOCUMENTATION CODES Per approved criteria  -Severe malnutrition in the context of chronic illness -Underweight   INTERVENTION: Add Resource Breeze po BID, each supplement provides 250 kcal and 9 grams of protein. Continue renal MVI daily. RD to continue to follow nutrition care plan.  NUTRITION DIAGNOSIS: Inadequate oral intake related to poor appetite as evidenced by patient report.   Goal: Intake to meet >90% of estimated nutrition needs.  Monitor:  weight trends, lab trends, I/O's, PO intake, supplement tolerance  Reason for Assessment: MD Consult for Nutrition Assessment  79 y.o. female  Admitting Dx: pulmonary edema  ASSESSMENT: PMHx significant for ESRD (on HD MWF), CHF, HTN, CABG, COPD. Per renal note, pt frequently misses HD and rarely runs her full tx. Admitted with generalized weakness. Work-up reveals pulmonary edema.  Pt endorses intermittent n/v x 10 days and poor appetite. Pt also endorses constipation. Pt reports that she has had ongoing weight loss for at least the past 3 months. Reports that she is compliant with her diet, however current RN reports that pt's family has been bringing her Outback and coke to drink. Pt doesn't like Nepro, willing to try Breeze. She states that she has an aide that makes food for her but lately her appetite has been so poor that she has been refusing to eat.  Pt and family met with palliative care yesterday afternoon - patient would like to continue HD.  Nutrition Focused Physical Exam:  Subcutaneous Fat:  Orbital Region: moderate depletion Upper Arm Region: severe depletion Thoracic and Lumbar Region: severe depletion  Muscle:  Temple Region: moderate depletion Clavicle Bone Region: severe depletion Clavicle and Acromion Bone Region: severe depletion Scapular Bone Region: n/a Dorsal Hand: moderate to severe depletion Patellar Region: severe depletion Anterior Thigh Region: severe  depletion Posterior Calf Region: severe depletion  Edema: none  Pt meets criteria for severe MALNUTRITION in the context of chronic illness as evidenced by severe fat and muscle mass loss.   EDW per renal: 47.5 kg  Potassium WNL Phosphorus elevated at 5.9   Height: Ht Readings from Last 1 Encounters:  11/13/13 _0  (1.6 m)    Weight: Wt Readings from Last 1 Encounters:  11/15/13 97 lb (43.999 kg)    Ideal Body Weight: 115 lb  % Ideal Body Weight: 84%  Wt Readings from Last 10 Encounters:  11/15/13 97 lb (43.999 kg)  06/11/13 108 lb (48.988 kg)  06/07/13 118 lb 6.2 oz (53.7 kg)  07/06/12 107 lb 9.4 oz (48.8 kg)  07/06/12 107 lb 9.4 oz (48.8 kg)  06/26/11 117 lb 11.6 oz (53.4 kg)  09/03/10 119 lb 6.4 oz (54.159 kg)    Usual Body Weight: 115 lb  % Usual Body Weight: 84%  BMI:  Body mass index is 17.19 kg/(m^2). Underweight  Estimated Nutritional Needs: Kcal: 1500 - 1800 kcal Protein: at least 63 g protein Fluid: 1.2 liters  Skin: intact  Diet Order: Renal w/ 1200 ml fluid restriction  EDUCATION NEEDS: -No education needs identified at this time   Intake/Output Summary (Last 24 hours) at 11/16/13 1002 Last data filed at 11/16/13 0504  Gross per 24 hour  Intake      0 ml  Output   1359 ml  Net  -1359 ml    Last BM: 6/14  Labs:   Recent Labs Lab 11/13/13 1915 11/14/13 0545 11/15/13 0725  NA 138 140 137  K 5.1 3.6* 4.2  CL 93* 94* 96  CO2 24 29  30  BUN 50* 13 21  CREATININE 6.39* 2.73* 4.15*  CALCIUM 7.7* 8.6 8.6  PHOS 6.3*  --  5.9*  GLUCOSE 92 81 79    CBG (last 3)  No results found for this basename: GLUCAP,  in the last 72 hours  Scheduled Meds: . albuterol  2.5 mg Nebulization BID  . ammonium lactate   Topical Daily  . aspirin EC  81 mg Oral Daily  . atorvastatin  80 mg Oral q1800  . budesonide  0.25 mg Nebulization BID  . calcium acetate  3,335 mg Oral TID WC  . carvedilol  12.5 mg Oral BID WC  . cholecalciferol  1,000  Units Oral Daily  . cinacalcet  60 mg Oral Q supper  . [START ON 11/17/2013] darbepoetin (ARANESP) injection - DIALYSIS  40 mcg Intravenous Q Wed-HD  . diphenhydrAMINE  50 mg Oral Once  . [START ON 11/17/2013] ferric gluconate (FERRLECIT/NULECIT) IV  62.5 mg Intravenous Q Wed-HD  . heparin  5,000 Units Subcutaneous 3 times per day  . levothyroxine  150 mcg Oral QAC breakfast  . loratadine  10 mg Oral Daily  . multivitamin  1 tablet Oral QHS  . oxyCODONE-acetaminophen  1 tablet Oral 3 times per day   And  . oxyCODONE  2.5 mg Oral 3 times per day  . pantoprazole  40 mg Oral Daily  . predniSONE  50 mg Oral Q6H  . senna  2 tablet Oral BID  . sodium chloride  3 mL Intravenous Q12H  . temazepam  30 mg Oral QHS  . tiotropium  18 mcg Inhalation Daily    Continuous Infusions:   Past Medical History  Diagnosis Date  . Carotid artery occlusion     Bilateral carotid artery disease  . Peripheral artery disease   . Arrhythmia     ? hx of atrial fibrillation  . CHF (congestive heart failure)     EF 25-30%.   . Hypertension   . Hyperlipidemia   . ESRD on hemodialysis 06/20/11    M, W, F; [place].  Started HD in Sept 2008  . AAA (abdominal aortic aneurysm)   . Stroke ` 2007  . Duodenal ulcer 2008  . S/P CABG (coronary artery bypass graft)     CABG in 2008, had 3 MI's prior  . Depression   . Pneumonia   . COPD, severe   . Hypothyroidism   . Anemia   . Pacemaker     Past Surgical History  Procedure Laterality Date  . Thyroid surgery  2004  . Dg av dialysis  shunt access exist*l* or  03/2000    left upper arm  . Vaginal hysterectomy  1976  . Hemorrhoid surgery  1971  . Insert / replace / remove pacemaker  1990's    initial placement  . Insert / replace / remove pacemaker  05/2011    "changed out"  . Incisional hernia repair  10/2010    incarcerated  . Common iliac  06/2009    bilateral kissing stents  . Pseudoaneurysm repair  09/1999    right groin  . Av fistula  repair  04/2007    left upper arm  . Cardiac catheterization  02/10, 03/12    Most recent showed 3 vessel CAD with patent grafts: SVG to LAD, SVG to PDA and SVG to OM  . Coronary angioplasty with stent placement  09/1999    "1"  . Coronary artery bypass graft  02/2007  CABG X3    Inda Coke MS, RD, LDN Inpatient Registered Dietitian Pager: [phone] After-hours pager: [phone]

## 2013-11-16 NOTE — Progress Notes (Signed)
Subjective:  Coughing during night, but currently comfortable with no dyspnea; during meeting with Palliative Care she decided to continue dialysis  Objective: Vital signs in last 24 hours: Temp:  [97.8 F (36.6 C)-98.4 F (36.9 C)] 98 F (36.7 C) (06/16 0458) Pulse Rate:  [72-85] 76 (06/16 0458) Resp:  [12-17] 17 (06/16 0458) BP: (87-117)/(46-67) 117/65 mmHg (06/16 0458) SpO2:  [95 %-99 %] 95 % (06/16 0458) Weight:  [43.999 kg (97 lb)-44 kg (97 lb)] 43.999 kg (97 lb) (06/15 2049) Weight change: -0.9 kg (-1 lb 15.7 oz)  Intake/Output from previous day: 06/15 0701 - 06/16 0700 In: 0  Out: 1359    Lab Results:  Recent Labs  11/14/13 0545 11/15/13 0720  WBC 7.0 6.1  HGB 10.0* 9.9*  HCT 32.0* 32.2*  PLT 230 224   BMET:  Recent Labs  11/13/13 1915 11/14/13 0545 11/15/13 0725  NA 138 140 137  K 5.1 3.6* 4.2  CL 93* 94* 96  CO2 $Re'24 29 30  'YPI$ GLUCOSE 92 81 79  BUN 50* 13 21  CREATININE 6.39* 2.73* 4.15*  CALCIUM 7.7* 8.6 8.6  ALBUMIN 2.5*  --  2.1*   No results found for this basename: PTH,  in the last 72 hours Iron Studies: No results found for this basename: IRON, TIBC, TRANSFERRIN, FERRITIN,  in the last 72 hours  Studies/Results: No results found.  EXAM:  General appearance: Chronically ill, alert, in no apparent distress  Resp: CTA without rales, rhonchi, or wheezes  Cardio: RRR with Gr II/VI systolic murmur, no rub  GI: + BS, soft and nontender  Extremities: No edema  Access: AVF @ LUA with + bruit   Dialysis Orders: MWF @ [facility]  47.5kgs 3.5hr 2K/2Ca No Heparin 350/1.5 L AVF  No hectorol Aranesp 40 q week Venofer 50 q week  Profile 2  Assessment/Plan: 1. Weakness - pulmonary edema per CXR @ [facility] sec to missed Txs, improving with HD.  2. Abdominal pain - resolved, x-ray 6/14 showed only constipation. 3. Prolonged bleeding from AVF - for 2 wks @ center, no problems at hospital. Fistulogram per IR today s/p prep for contrast dye allergy. 4. ESRD - HD  on NWF @ [facility], K 4.2.  Next HD tomorrow. 5. HTN/Volume - BP 117/65 on Carvedilol; wt 44 kg s/p net UF 1.4 L yesterday; lower EDW at discharge. 6. Anemia - Hgb 9.9 on Aranesp 40 mcg & Fe on Wed. 7. Sec HPT - Ca 8.6 (10.1 corrected), P 5.9; Sensipar 60 mg qd, Phoslo 4 with meals. 8. Nutrition - Alb 2.1, renal diet & vitamin.     LOS: 3 days   LYLES,CHARLES 11/16/2013,7:27 AM  Pt seen, examined and agree w A/P as above. Had procedure in IR today. OK for d/c from renal standpoint.  Pt and family met with palliative care today, no change in dialysis.  Kelly Splinter MD pager [phone]    cell [phone] 11/16/2013, 2:29 PM

## 2013-11-16 NOTE — Discharge Summary (Signed)
Physician Discharge Summary  JAYLAA GALLION ZOX:[id number] DOB: June 30, 1932 DOA: 11/13/2013  PCP: Galvin Proffer, MD  Admit date: 11/13/2013 Discharge date: 11/16/2013  Time spent: 25 minutes  Recommendations for Outpatient Follow-up:  1. Patient will continue dialysis 2. As per her request, Geomat cushion for her to set on urinalysis to better tolerate  Discharge Diagnoses:  Active Problems:   Hypertension   ESRD (end stage renal disease) on dialysis   SOB (shortness of breath)   Pulmonary edema   Dementia   Patient nonadherence   Tobacco abuse   Weakness   Unspecified hypothyroidism   Discharge Condition: Improved, being discharged home  Diet recommendation: Regular  Filed Weights   11/15/13 0720 11/15/13 1034 11/15/13 2049  Weight: 45.2 kg (99 lb 10.4 oz) 44 kg (97 lb) 43.999 kg (97 lb)    History of present illness:  80 year old white female past medical history of CHF and hemodialysis who intermittently skips dialysis because she does not like it came in with shortness of breath from volume overload. She was admitted to the hospitalist service on 6/13  Hospital Course:  Active Problems:   Hypertension: Blood pressure remained stable. Continue her home meds.    ESRD (end stage renal disease) on dialysis   SOB (shortness of breath): Resolved with dialysis.    Pulmonary edema: Secondary volume overload from missing dialysis. Treated with    Patient nonadherence: See above. Patient feels horrible usually after dialysis. She mentioned to her nephrologist that she wanted to stop this. Palliative care was consulted and patient underwent a goals of care meeting.  Despite not liking dialysis, patient admits that she still wants to live and does not want to quit dialysis. Patient may clear she desires this intervention even though she is declining. She wants to remain a full code. To help with some of her symptoms, have ordered her a geomat for her to sit on to make dialysis more  comfortable. Prescription topical lotion for severely dry skin. Nasal saline for congestion.    Tobacco abuse: Counseled. Declined nicotine patch while here.    Weakness: Chronic. Improved after dialysis.    Unspecified hypothyroidism: Stable. On dialysis  Procedures:  Status post fistulogram done 6/16 by interventional radiology. Formal report not posted-reportedly from nephrology, stent placed  Consultations:  Nephrology  Palliative care  Interventional radiology  Discharge Exam: Filed Vitals:   11/16/13 0937  BP: 120/60  Pulse: 76  Temp: 98.7 F (37.1 C)  Resp: 18    General: Alert and oriented x3, no acute distress Cardiovascular: Regular rate and rhythm, S1-S2, soft 2/6 systolic ejection murmur Respiratory: Clear to auscultation bilaterally  Discharge Instructions You were cared for by a hospitalist during your hospital stay. If you have any questions about your discharge medications or the care you received while you were in the hospital after you are discharged, you can call the unit and asked to speak with the hospitalist on call if the hospitalist that took care of you is not available. Once you are discharged, your primary care physician will handle any further medical issues. Please note that NO REFILLS for any discharge medications will be authorized once you are discharged, as it is imperative that you return to your primary care physician (or establish a relationship with a primary care physician if you do not have one) for your aftercare needs so that they can reassess your need for medications and monitor your lab values.  Discharge Instructions   Diet - low sodium  heart healthy    Complete by:  As directed      Increase activity slowly    Complete by:  As directed             Medication List         ALPRAZolam 0.5 MG tablet  Commonly known as:  XANAX  Take 0.5 mg by mouth 3 (three) times daily as needed for anxiety.     ammonium lactate 12 %  lotion  Commonly known as:  LAC-HYDRIN  Apply topically daily.     aspirin EC 81 MG tablet  Take 81 mg by mouth daily.     atorvastatin 80 MG tablet  Commonly known as:  LIPITOR  Take 80 mg by mouth daily.     cholecalciferol 1000 UNITS tablet  Commonly known as:  VITAMIN D  Take 1,000 Units by mouth daily.     cinacalcet 60 MG tablet  Commonly known as:  SENSIPAR  Take 60 mg by mouth daily.     dextromethorphan 30 MG/5ML liquid  Commonly known as:  DELSYM  Take 30 mg by mouth every 12 (twelve) hours as needed for cough.     docusate sodium 100 MG capsule  Commonly known as:  COLACE  Take 300 mg by mouth daily.     feeding supplement (RESOURCE BREEZE) Liqd  Take 1 Container by mouth 2 (two) times daily between meals.     folic acid 400 MCG tablet  Commonly known as:  FOLVITE  Take 400 mcg by mouth daily.     furosemide 80 MG tablet  Commonly known as:  LASIX  Take 80 mg by mouth 2 (two) times daily.     hydrALAZINE 25 MG tablet  Commonly known as:  APRESOLINE  Take 25 mg by mouth 3 (three) times daily.     ipratropium 0.02 % nebulizer solution  Commonly known as:  ATROVENT  Take 0.5 mg by nebulization 2 (two) times daily as needed for wheezing or shortness of breath.     levothyroxine 150 MCG tablet  Commonly known as:  SYNTHROID, LEVOTHROID  Take 150 mcg by mouth daily before breakfast.     nitroGLYCERIN 0.4 MG SL tablet  Commonly known as:  NITROSTAT  Place 0.4 mg under the tongue every 5 (five) minutes as needed for chest pain.     oxyCODONE-acetaminophen 7.5-325 MG per tablet  Commonly known as:  PERCOCET  Take 1 tablet by mouth every 8 (eight) hours.     PHOSLO 667 MG capsule  Generic drug:  calcium acetate  Take 3,335 mg by mouth 3 (three) times daily with meals.     pramoxine-mineral oil-zinc 1-12.5 % rectal ointment  Commonly known as:  TUCKS  Place rectally 2 (two) times daily as needed for itching or hemorrhoids.     promethazine 25 MG  tablet  Commonly known as:  PHENERGAN  Take 25 mg by mouth every 6 (six) hours as needed for nausea or vomiting.     temazepam 30 MG capsule  Commonly known as:  RESTORIL  Take 30 mg by mouth at bedtime.     tiotropium 18 MCG inhalation capsule  Commonly known as:  SPIRIVA  Place 1 capsule (18 mcg total) into inhaler and inhale daily.       Allergies  Allergen Reactions  . Ivp Dye [Iodinated Diagnostic Agents] Swelling  . Lisinopril Swelling  . Iohexol Itching and Swelling           The  results of significant diagnostics from this hospitalization (including imaging, microbiology, ancillary and laboratory) are listed below for reference.    Significant Diagnostic Studies: Dg Chest Port 1 View  11/14/2013   CLINICAL DATA:  Shortness of breath.  Cough.  EXAM: PORTABLE CHEST - 1 VIEW  COMPARISON:  Chest x-ray 11/13/2013.  FINDINGS: Cardiomegaly with pulmonary vascular prominence and interstitial prominence present consistent with congestive heart failure with pulmonary interstitial edema. Pneumonitis cannot be excluded. Cardiac pacer with lead tips in right atrium and right ventricle. Prior CABG. No pneumothorax. No acute bony abnormality.  IMPRESSION: Congestive heart failure with pulmonary interstitial edema.   Electronically Signed   By: Maisie Fushomas  Register   On: 11/14/2013 14:47   Dg Abd Portable 2v  11/14/2013   CLINICAL DATA:  Abdominal pain.  Constipation .  EXAM: PORTABLE ABDOMEN - 2 VIEW  COMPARISON:  CT 06/29/2013  FINDINGS: Soft tissue structures are unremarkable. Moderate amount of stool noted: Marland Kitchen. The gas pattern is nonspecific. Surgical clips right upper quadrant. Aortoiliac and visceral atherosclerotic vascular disease. Bilateral iliac stents are noted. Pelvic phleboliths. Degenerative changes lumbar spine and both hips. Cardiomegaly. Cardiac pacer.  IMPRESSION: 1. Moderate amount of stool in colon.  No bowel distention. 2. Aortoiliac atherosclerotic vascular disease. Bilateral  iliac stents are present. Visceral atherosclerotic vascular disease. 3. Cholecystectomy. 4. Cardiomegaly.  Cardiac pacer.   Electronically Signed   By: Maisie Fushomas  Register   On: 11/14/2013 08:04    Microbiology: No results found for this or any previous visit (from the past 240 hour(s)).   Labs: Basic Metabolic Panel:  Recent Labs Lab 11/13/13 1915 11/14/13 0545 11/15/13 0725  NA 138 140 137  K 5.1 3.6* 4.2  CL 93* 94* 96  CO2 24 29 30   GLUCOSE 92 81 79  BUN 50* 13 21  CREATININE 6.39* 2.73* 4.15*  CALCIUM 7.7* 8.6 8.6  PHOS 6.3*  --  5.9*   Liver Function Tests:  Recent Labs Lab 11/13/13 1915 11/15/13 0725  ALBUMIN 2.5* 2.1*   No results found for this basename: LIPASE, AMYLASE,  in the last 168 hours No results found for this basename: AMMONIA,  in the last 168 hours CBC:  Recent Labs Lab 11/13/13 1915 11/14/13 0545 11/15/13 0720  WBC 8.2 7.0 6.1  HGB 9.8* 10.0* 9.9*  HCT 31.1* 32.0* 32.2*  MCV 99.7 101.6* 102.2*  PLT 253 230 224   Cardiac Enzymes:  Recent Labs Lab 11/13/13 1749 11/14/13 0102 11/14/13 0545  TROPONINI <0.30 <0.30 <0.30   BNP: BNP (last 3 results) No results found for this basename: PROBNP,  in the last 8760 hours CBG: No results found for this basename: GLUCAP,  in the last 168 hours     Signed:  Hollice EspyKRISHNAN,SENDIL K  [facility] 11/16/2013, 5:00 PM

## 2013-11-16 NOTE — Progress Notes (Signed)
Patient discharge teaching given, including activity, diet, follow-up appoints, and medications. Patient verbalized understanding of all discharge instructions. IV access was d/c'd. Vitals are stable. Skin is intact except as charted in most recent assessments. Pt to be escorted out by NT, to be driven home by family.  Andrew Brake, MBA, BS, RN 

## 2013-11-24 ENCOUNTER — Inpatient Hospital Stay (HOSPITAL_COMMUNITY)
Admission: AD | Admit: 2013-11-24 | Discharge: 2013-12-03 | DRG: 314 | Disposition: A | Discharge status: Home Health | Payer: Medicare HMO | Source: Other Acute Inpatient Hospital | Attending: Internal Medicine | Admitting: Internal Medicine

## 2013-11-24 ENCOUNTER — Encounter (HOSPITAL_COMMUNITY): Payer: Self-pay | Admitting: Emergency Medicine

## 2013-11-24 DIAGNOSIS — I2510 Atherosclerotic heart disease of native coronary artery without angina pectoris: Secondary | ICD-10-CM | POA: Diagnosis present

## 2013-11-24 DIAGNOSIS — F419 Anxiety disorder, unspecified: Secondary | ICD-10-CM

## 2013-11-24 DIAGNOSIS — R0602 Shortness of breath: Secondary | ICD-10-CM

## 2013-11-24 DIAGNOSIS — E119 Type 2 diabetes mellitus without complications: Secondary | ICD-10-CM | POA: Diagnosis present

## 2013-11-24 DIAGNOSIS — Z992 Dependence on renal dialysis: Secondary | ICD-10-CM | POA: Diagnosis not present

## 2013-11-24 DIAGNOSIS — M949 Disorder of cartilage, unspecified: Secondary | ICD-10-CM

## 2013-11-24 DIAGNOSIS — B952 Enterococcus as the cause of diseases classified elsewhere: Secondary | ICD-10-CM

## 2013-11-24 DIAGNOSIS — I071 Rheumatic tricuspid insufficiency: Secondary | ICD-10-CM

## 2013-11-24 DIAGNOSIS — N2581 Secondary hyperparathyroidism of renal origin: Secondary | ICD-10-CM | POA: Diagnosis present

## 2013-11-24 DIAGNOSIS — Z8673 Personal history of transient ischemic attack (TIA), and cerebral infarction without residual deficits: Secondary | ICD-10-CM | POA: Diagnosis not present

## 2013-11-24 DIAGNOSIS — R1012 Left upper quadrant pain: Secondary | ICD-10-CM | POA: Diagnosis not present

## 2013-11-24 DIAGNOSIS — I2589 Other forms of chronic ischemic heart disease: Secondary | ICD-10-CM | POA: Diagnosis present

## 2013-11-24 DIAGNOSIS — I714 Abdominal aortic aneurysm, without rupture, unspecified: Secondary | ICD-10-CM | POA: Diagnosis present

## 2013-11-24 DIAGNOSIS — E43 Unspecified severe protein-calorie malnutrition: Secondary | ICD-10-CM

## 2013-11-24 DIAGNOSIS — I953 Hypotension of hemodialysis: Secondary | ICD-10-CM | POA: Diagnosis present

## 2013-11-24 DIAGNOSIS — N186 End stage renal disease: Secondary | ICD-10-CM | POA: Diagnosis present

## 2013-11-24 DIAGNOSIS — R6520 Severe sepsis without septic shock: Secondary | ICD-10-CM

## 2013-11-24 DIAGNOSIS — Z9981 Dependence on supplemental oxygen: Secondary | ICD-10-CM | POA: Diagnosis not present

## 2013-11-24 DIAGNOSIS — I252 Old myocardial infarction: Secondary | ICD-10-CM

## 2013-11-24 DIAGNOSIS — E785 Hyperlipidemia, unspecified: Secondary | ICD-10-CM | POA: Diagnosis present

## 2013-11-24 DIAGNOSIS — T827XXA Infection and inflammatory reaction due to other cardiac and vascular devices, implants and grafts, initial encounter: Secondary | ICD-10-CM | POA: Diagnosis present

## 2013-11-24 DIAGNOSIS — I079 Rheumatic tricuspid valve disease, unspecified: Secondary | ICD-10-CM | POA: Diagnosis present

## 2013-11-24 DIAGNOSIS — I739 Peripheral vascular disease, unspecified: Secondary | ICD-10-CM | POA: Diagnosis present

## 2013-11-24 DIAGNOSIS — Z95810 Presence of automatic (implantable) cardiac defibrillator: Secondary | ICD-10-CM | POA: Diagnosis not present

## 2013-11-24 DIAGNOSIS — F17200 Nicotine dependence, unspecified, uncomplicated: Secondary | ICD-10-CM | POA: Diagnosis present

## 2013-11-24 DIAGNOSIS — D6489 Other specified anemias: Secondary | ICD-10-CM | POA: Diagnosis present

## 2013-11-24 DIAGNOSIS — I442 Atrioventricular block, complete: Secondary | ICD-10-CM | POA: Diagnosis present

## 2013-11-24 DIAGNOSIS — M899 Disorder of bone, unspecified: Secondary | ICD-10-CM | POA: Diagnosis present

## 2013-11-24 DIAGNOSIS — J4489 Other specified chronic obstructive pulmonary disease: Secondary | ICD-10-CM | POA: Diagnosis present

## 2013-11-24 DIAGNOSIS — E039 Hypothyroidism, unspecified: Secondary | ICD-10-CM | POA: Diagnosis present

## 2013-11-24 DIAGNOSIS — I959 Hypotension, unspecified: Secondary | ICD-10-CM | POA: Diagnosis present

## 2013-11-24 DIAGNOSIS — R0789 Other chest pain: Secondary | ICD-10-CM

## 2013-11-24 DIAGNOSIS — Z113 Encounter for screening for infections with a predominantly sexual mode of transmission: Secondary | ICD-10-CM

## 2013-11-24 DIAGNOSIS — B999 Unspecified infectious disease: Secondary | ICD-10-CM

## 2013-11-24 DIAGNOSIS — J438 Other emphysema: Secondary | ICD-10-CM

## 2013-11-24 DIAGNOSIS — I330 Acute and subacute infective endocarditis: Secondary | ICD-10-CM

## 2013-11-24 DIAGNOSIS — I120 Hypertensive chronic kidney disease with stage 5 chronic kidney disease or end stage renal disease: Secondary | ICD-10-CM | POA: Diagnosis present

## 2013-11-24 DIAGNOSIS — R7881 Bacteremia: Secondary | ICD-10-CM

## 2013-11-24 DIAGNOSIS — J449 Chronic obstructive pulmonary disease, unspecified: Secondary | ICD-10-CM | POA: Diagnosis present

## 2013-11-24 DIAGNOSIS — I509 Heart failure, unspecified: Secondary | ICD-10-CM | POA: Diagnosis present

## 2013-11-24 DIAGNOSIS — F329 Major depressive disorder, single episode, unspecified: Secondary | ICD-10-CM

## 2013-11-24 DIAGNOSIS — Z9861 Coronary angioplasty status: Secondary | ICD-10-CM

## 2013-11-24 DIAGNOSIS — R579 Shock, unspecified: Secondary | ICD-10-CM

## 2013-11-24 DIAGNOSIS — D649 Anemia, unspecified: Secondary | ICD-10-CM | POA: Diagnosis present

## 2013-11-24 DIAGNOSIS — I4891 Unspecified atrial fibrillation: Secondary | ICD-10-CM | POA: Diagnosis present

## 2013-11-24 DIAGNOSIS — Z951 Presence of aortocoronary bypass graft: Secondary | ICD-10-CM | POA: Diagnosis not present

## 2013-11-24 DIAGNOSIS — T827XXD Infection and inflammatory reaction due to other cardiac and vascular devices, implants and grafts, subsequent encounter: Secondary | ICD-10-CM

## 2013-11-24 DIAGNOSIS — J439 Emphysema, unspecified: Secondary | ICD-10-CM

## 2013-11-24 DIAGNOSIS — R6521 Severe sepsis with septic shock: Secondary | ICD-10-CM

## 2013-11-24 DIAGNOSIS — A419 Sepsis, unspecified organism: Secondary | ICD-10-CM

## 2013-11-24 DIAGNOSIS — Y841 Kidney dialysis as the cause of abnormal reaction of the patient, or of later complication, without mention of misadventure at the time of the procedure: Secondary | ICD-10-CM | POA: Diagnosis present

## 2013-11-24 DIAGNOSIS — J189 Pneumonia, unspecified organism: Secondary | ICD-10-CM

## 2013-11-24 HISTORY — DX: Atrioventricular block, complete: I44.2

## 2013-11-24 HISTORY — DX: Unspecified atrial fibrillation: I48.91

## 2013-11-24 LAB — COMPREHENSIVE METABOLIC PANEL
ALT: 32 U/L (ref 0–35)
AST: 43 U/L — AB (ref 0–37)
Albumin: 1.9 g/dL — ABNORMAL LOW (ref 3.5–5.2)
Alkaline Phosphatase: 150 U/L — ABNORMAL HIGH (ref 39–117)
BUN: 31 mg/dL — ABNORMAL HIGH (ref 6–23)
CALCIUM: 7.4 mg/dL — AB (ref 8.4–10.5)
CO2: 25 mEq/L (ref 19–32)
Chloride: 96 mEq/L (ref 96–112)
Creatinine, Ser: 3.51 mg/dL — ABNORMAL HIGH (ref 0.50–1.10)
GFR calc Af Amer: 13 mL/min — ABNORMAL LOW (ref >90)
GFR calc non Af Amer: 11 mL/min — ABNORMAL LOW (ref >90)
Glucose, Bld: 109 mg/dL — ABNORMAL HIGH (ref 70–99)
Potassium: 3.9 mEq/L (ref 3.7–5.3)
Sodium: 136 mEq/L — ABNORMAL LOW (ref 137–147)
TOTAL PROTEIN: 4.7 g/dL — AB (ref 6.0–8.3)
Total Bilirubin: 0.3 mg/dL (ref 0.3–1.2)

## 2013-11-24 LAB — GLUCOSE, CAPILLARY
GLUCOSE-CAPILLARY: 96 mg/dL (ref 70–99)
Glucose-Capillary: 107 mg/dL — ABNORMAL HIGH (ref 70–99)
Glucose-Capillary: 122 mg/dL — ABNORMAL HIGH (ref 70–99)
Glucose-Capillary: 122 mg/dL — ABNORMAL HIGH (ref 70–99)
Glucose-Capillary: 90 mg/dL (ref 70–99)

## 2013-11-24 LAB — CBC
HCT: 25 % — ABNORMAL LOW (ref 36.0–46.0)
HEMOGLOBIN: 7.9 g/dL — AB (ref 12.0–15.0)
MCH: 32.2 pg (ref 26.0–34.0)
MCHC: 31.6 g/dL (ref 30.0–36.0)
MCV: 102 fL — ABNORMAL HIGH (ref 78.0–100.0)
Platelets: 119 10*3/uL — ABNORMAL LOW (ref 150–400)
RBC: 2.45 MIL/uL — AB (ref 3.87–5.11)
RDW: 16.6 % — ABNORMAL HIGH (ref 11.5–15.5)
WBC: 14.1 10*3/uL — AB (ref 4.0–10.5)

## 2013-11-24 LAB — TSH: TSH: 6.38 u[IU]/mL — ABNORMAL HIGH (ref 0.350–4.500)

## 2013-11-24 LAB — TROPONIN I: Troponin I: <0.3 ng/mL (ref <0.30)

## 2013-11-24 LAB — PROTIME-INR
INR: 1.17 (ref 0.00–1.49)
PROTHROMBIN TIME: 14.7 s (ref 11.6–15.2)

## 2013-11-24 LAB — MRSA PCR SCREENING: MRSA by PCR: NEGATIVE

## 2013-11-24 LAB — CORTISOL: CORTISOL PLASMA: 20 ug/dL

## 2013-11-24 LAB — PHOSPHORUS: PHOSPHORUS: 3.8 mg/dL (ref 2.3–4.6)

## 2013-11-24 LAB — MAGNESIUM: Magnesium: 1.6 mg/dL (ref 1.5–2.5)

## 2013-11-24 LAB — PROCALCITONIN: Procalcitonin: 59.14 ng/mL

## 2013-11-24 MED ORDER — VITAMIN D3 25 MCG (1000 UNIT) PO TABS
1000.0000 [IU] | ORAL_TABLET | Freq: Every day | ORAL | Status: DC
  Administered 2013-11-24 – 2013-11-26 (×3): 1000 [IU] via ORAL
  Filled 2013-11-24 (×4): qty 1

## 2013-11-24 MED ORDER — IPRATROPIUM-ALBUTEROL 0.5-2.5 (3) MG/3ML IN SOLN
3.0000 mL | Freq: Four times a day (QID) | RESPIRATORY_TRACT | Status: DC
  Administered 2013-11-24 – 2013-12-01 (×27): 3 mL via RESPIRATORY_TRACT
  Filled 2013-11-24 (×27): qty 3

## 2013-11-24 MED ORDER — SODIUM CHLORIDE 0.9 % IV BOLUS (SEPSIS)
500.0000 mL | Freq: Once | INTRAVENOUS | Status: AC
  Administered 2013-11-24: 500 mL via INTRAVENOUS

## 2013-11-24 MED ORDER — BOOST / RESOURCE BREEZE PO LIQD
1.0000 | Freq: Two times a day (BID) | ORAL | Status: DC
  Administered 2013-11-24 – 2013-12-01 (×6): 1 via ORAL

## 2013-11-24 MED ORDER — ASPIRIN EC 81 MG PO TBEC
81.0000 mg | DELAYED_RELEASE_TABLET | Freq: Every day | ORAL | Status: DC
  Administered 2013-11-24 – 2013-12-03 (×9): 81 mg via ORAL
  Filled 2013-11-24 (×10): qty 1

## 2013-11-24 MED ORDER — PANTOPRAZOLE SODIUM 40 MG PO TBEC
40.0000 mg | DELAYED_RELEASE_TABLET | Freq: Every day | ORAL | Status: DC
  Administered 2013-11-24 – 2013-12-03 (×10): 40 mg via ORAL
  Filled 2013-11-24 (×10): qty 1

## 2013-11-24 MED ORDER — VANCOMYCIN HCL IN DEXTROSE 1-5 GM/200ML-% IV SOLN
1000.0000 mg | Freq: Once | INTRAVENOUS | Status: AC
Start: 2013-11-24 — End: 2013-11-24
  Administered 2013-11-24: 1000 mg via INTRAVENOUS
  Filled 2013-11-24: qty 200

## 2013-11-24 MED ORDER — INSULIN ASPART 100 UNIT/ML ~~LOC~~ SOLN
0.0000 [IU] | Freq: Every day | SUBCUTANEOUS | Status: DC
Start: 2013-11-24 — End: 2013-12-03

## 2013-11-24 MED ORDER — CALCIUM ACETATE 667 MG PO CAPS
3335.0000 mg | ORAL_CAPSULE | Freq: Three times a day (TID) | ORAL | Status: DC
  Administered 2013-11-24 – 2013-11-26 (×9): 3335 mg via ORAL
  Filled 2013-11-24 (×13): qty 5

## 2013-11-24 MED ORDER — DEXTROSE 5 % IV SOLN
2.0000 g | Freq: Once | INTRAVENOUS | Status: AC
  Administered 2013-11-24: 2 g via INTRAVENOUS
  Filled 2013-11-24: qty 2

## 2013-11-24 MED ORDER — LEVOTHYROXINE SODIUM 150 MCG PO TABS
150.0000 ug | ORAL_TABLET | Freq: Every day | ORAL | Status: DC
  Administered 2013-11-24 – 2013-12-03 (×10): 150 ug via ORAL
  Filled 2013-11-24 (×11): qty 1

## 2013-11-24 MED ORDER — HEPARIN SODIUM (PORCINE) 5000 UNIT/ML IJ SOLN
5000.0000 [IU] | Freq: Three times a day (TID) | INTRAMUSCULAR | Status: DC
  Administered 2013-11-24 – 2013-12-03 (×25): 5000 [IU] via SUBCUTANEOUS
  Filled 2013-11-24 (×33): qty 1

## 2013-11-24 MED ORDER — PRAMOXINE-ZINC OXIDE IN MO 1-12.5 % RE OINT
TOPICAL_OINTMENT | Freq: Two times a day (BID) | RECTAL | Status: DC | PRN
  Administered 2013-11-25 – 2013-11-29 (×2): via RECTAL
  Administered 2013-11-30: 1 via RECTAL
  Filled 2013-11-24: qty 28.3

## 2013-11-24 MED ORDER — INSULIN ASPART 100 UNIT/ML ~~LOC~~ SOLN: 0.0000 [IU] | Freq: Three times a day (TID) | SUBCUTANEOUS | Status: DC

## 2013-11-24 MED ORDER — SODIUM CHLORIDE 0.9 % IV SOLN: 250.0000 mL | INTRAVENOUS | Status: DC | PRN

## 2013-11-24 MED ORDER — CINACALCET HCL 30 MG PO TABS
60.0000 mg | ORAL_TABLET | Freq: Every day | ORAL | Status: DC
  Administered 2013-11-24 – 2013-12-03 (×9): 60 mg via ORAL
  Filled 2013-11-24 (×11): qty 2

## 2013-11-24 MED ORDER — DEXTROMETHORPHAN POLISTIREX 30 MG/5ML PO LQCR
30.0000 mg | Freq: Two times a day (BID) | ORAL | Status: DC | PRN
  Administered 2013-11-24 – 2013-12-02 (×4): 30 mg via ORAL
  Filled 2013-11-24 (×4): qty 5

## 2013-11-24 MED ORDER — IBUPROFEN 400 MG PO TABS
400.0000 mg | ORAL_TABLET | Freq: Four times a day (QID) | ORAL | Status: DC | PRN
  Administered 2013-11-27 – 2013-12-01 (×7): 400 mg via ORAL
  Filled 2013-11-24 (×8): qty 1

## 2013-11-24 MED ORDER — FOLIC ACID 0.5 MG HALF TAB
0.5000 mg | ORAL_TABLET | Freq: Every day | ORAL | Status: DC
  Administered 2013-11-24 – 2013-12-03 (×9): 0.5 mg via ORAL
  Filled 2013-11-24 (×10): qty 1

## 2013-11-24 MED ORDER — ACETAMINOPHEN 325 MG PO TABS
650.0000 mg | ORAL_TABLET | Freq: Four times a day (QID) | ORAL | Status: DC | PRN
  Administered 2013-11-24: 650 mg via ORAL
  Filled 2013-11-24: qty 2

## 2013-11-24 MED ORDER — NITROGLYCERIN 0.4 MG SL SUBL: 0.4000 mg | SUBLINGUAL_TABLET | SUBLINGUAL | Status: DC | PRN

## 2013-11-24 MED ORDER — SODIUM CHLORIDE 0.9 % IV SOLN
INTRAVENOUS | Status: DC
  Administered 2013-11-24: 03:00:00 via INTRAVENOUS

## 2013-11-24 MED ORDER — ALBUTEROL SULFATE (2.5 MG/3ML) 0.083% IN NEBU: 2.5000 mg | INHALATION_SOLUTION | RESPIRATORY_TRACT | Status: DC | PRN

## 2013-11-24 MED ORDER — DOCUSATE SODIUM 100 MG PO CAPS
300.0000 mg | ORAL_CAPSULE | Freq: Every day | ORAL | Status: DC
  Administered 2013-11-24 – 2013-11-26 (×3): 300 mg via ORAL
  Filled 2013-11-24 (×4): qty 3

## 2013-11-24 MED ORDER — ATORVASTATIN CALCIUM 80 MG PO TABS
80.0000 mg | ORAL_TABLET | Freq: Every day | ORAL | Status: DC
  Administered 2013-11-24 – 2013-12-02 (×8): 80 mg via ORAL
  Filled 2013-11-24 (×10): qty 1

## 2013-11-24 MED ORDER — SODIUM CHLORIDE 0.9 % IV BOLUS (SEPSIS)
1000.0000 mL | Freq: Once | INTRAVENOUS | Status: AC
  Administered 2013-11-24: 1000 mL via INTRAVENOUS

## 2013-11-24 MED ORDER — NOREPINEPHRINE BITARTRATE 1 MG/ML IV SOLN
2.0000 ug/min | INTRAVENOUS | Status: DC
  Filled 2013-11-24: qty 4

## 2013-11-24 NOTE — Progress Notes (Signed)
UR Completed.  Tramel Westbrook Jane [phone] 11/24/2013  

## 2013-11-24 NOTE — H&P (Signed)
PULMONARY / CRITICAL CARE MEDICINE   Name: [name] MRN: [id number] DOB: 1932-08-16    ADMISSION DATE:  11/24/2013  REFERRING MD :  [facility] PRIMARY SERVICE: PCCM  CHIEF COMPLAINT:  B LE pain  BRIEF PATIENT DESCRIPTION: 80 yo smoker w CAD/CABG, ischemic CM / CHF, ESRD, DM, severe COPD. Was well until developed B thigh and pelvis pain after HD on 6/23.  Transferred from [facility] ED with hypotension.    SIGNIFICANT EVENTS / STUDIES:  CT head 6/23 >> no acute abnormalities CT abd/pelvis 6/23 >> renal atrophy, rectal wall thickening ? Significance, extensive athlerosclerosis CXR 6/23 [facility]) >> cardiomegaly, pacer, minimal perihilar infiltrates w little change from priors  LINES / TUBES: L femoral CVC 6/23 >>   CULTURES: Blood 6/23 >>   ANTIBIOTICS: vanco 6/24 >>  Cefepime 6/24 >>   HISTORY OF PRESENT ILLNESS:  80 yo smoker w CAD/CABG, ischemic CM / CHF, ESRD, DM, severe COPD. She has been experiencing nausea, vomiting since middle of last week, was initially constipated but then had diarrhea through the weekend. Her Po intake has been decreased and she missed HD once last week. She felt well before going to HD on 6/23, but then experienced severe LE pain and cramping after she got home. This prompted her to go to the ED. She was found to be hypotensive, received ABX + IVF, moved to [facility] for further care.   PAST MEDICAL HISTORY :  Past Medical History  Diagnosis Date  . Carotid artery occlusion     Bilateral carotid artery disease  . Peripheral artery disease   . Arrhythmia     ? hx of atrial fibrillation  . CHF (congestive heart failure)     EF 25-30%.   . Hypertension   . Hyperlipidemia   . ESRD on hemodialysis 06/20/11    M, W, F; [address] [place].  Started HD in Sept 2008  . AAA (abdominal aortic aneurysm)   . Stroke ` 2007  . Duodenal ulcer 2008  . S/P CABG (coronary artery bypass graft)     CABG in 2008, had 3 MI's prior  . Depression   . Pneumonia   .  COPD, severe   . Hypothyroidism   . Anemia   . Pacemaker    Past Surgical History  Procedure Laterality Date  . Thyroid surgery  2004  . Dg av dialysis  shunt access exist*l* or  03/2000    left upper arm  . Vaginal hysterectomy  1976  . Hemorrhoid surgery  1971  . Insert / replace / remove pacemaker  1990's    initial placement  . Insert / replace / remove pacemaker  05/2011    "changed out"  . Incisional hernia repair  10/2010    incarcerated  . Common iliac  06/2009    bilateral kissing stents  . Pseudoaneurysm repair  09/1999    right groin  . Av fistula repair  04/2007    left upper arm  . Cardiac catheterization  02/10, 03/12    Most recent showed 3 vessel CAD with patent grafts: SVG to LAD, SVG to PDA and SVG to OM  . Coronary angioplasty with stent placement  09/1999    "1"  . Coronary artery bypass graft  02/2007    CABG X3   Prior to Admission medications   Medication Sig Start Date End Date Taking? Authorizing Provider  ALPRAZolam Prudy Feeler) 0.5 MG tablet Take 0.5 mg by mouth 3 (three) times daily as  needed for anxiety.    Historical Provider, MD  ammonium lactate (LAC-HYDRIN) 12 % lotion Apply topically daily. 11/16/13   Hollice EspySendil K Krishnan, MD  aspirin EC 81 MG tablet Take 81 mg by mouth daily.    Historical Provider, MD  atorvastatin (LIPITOR) 80 MG tablet Take 80 mg by mouth daily.    Historical Provider, MD  calcium acetate (PHOSLO) 667 MG capsule Take 3,335 mg by mouth 3 (three) times daily with meals.     Historical Provider, MD  cholecalciferol (VITAMIN D) 1000 UNITS tablet Take 1,000 Units by mouth daily.    Historical Provider, MD  cinacalcet (SENSIPAR) 60 MG tablet Take 60 mg by mouth daily.    Historical Provider, MD  dextromethorphan (DELSYM) 30 MG/5ML liquid Take 30 mg by mouth every 12 (twelve) hours as needed for cough.     Historical Provider, MD  docusate sodium (COLACE) 100 MG capsule Take 300 mg by mouth daily.     Historical Provider, MD  feeding  supplement, RESOURCE BREEZE, (RESOURCE BREEZE) LIQD Take 1 Container by mouth 2 (two) times daily between meals. 11/16/13   Hollice EspySendil K Krishnan, MD  folic acid (FOLVITE) 400 MCG tablet Take 400 mcg by mouth daily.    Historical Provider, MD  furosemide (LASIX) 80 MG tablet Take 80 mg by mouth 2 (two) times daily.     Historical Provider, MD  hydrALAZINE (APRESOLINE) 25 MG tablet Take 25 mg by mouth 3 (three) times daily.    Historical Provider, MD  ipratropium (ATROVENT) 0.02 % nebulizer solution Take 0.5 mg by nebulization 2 (two) times daily as needed for wheezing or shortness of breath.    Historical Provider, MD  levothyroxine (SYNTHROID, LEVOTHROID) 150 MCG tablet Take 150 mcg by mouth daily before breakfast.    Historical Provider, MD  nitroGLYCERIN (NITROSTAT) 0.4 MG SL tablet Place 0.4 mg under the tongue every 5 (five) minutes as needed for chest pain.     Historical Provider, MD  oxyCODONE-acetaminophen (PERCOCET) 7.5-325 MG per tablet Take 1 tablet by mouth every 8 (eight) hours.    Historical Provider, MD  pramoxine-mineral oil-zinc (TUCKS) 1-12.5 % rectal ointment Place rectally 2 (two) times daily as needed for itching or hemorrhoids. 11/16/13   Hollice EspySendil K Krishnan, MD  promethazine (PHENERGAN) 25 MG tablet Take 25 mg by mouth every 6 (six) hours as needed for nausea or vomiting.     Historical Provider, MD  temazepam (RESTORIL) 30 MG capsule Take 30 mg by mouth at bedtime.    Historical Provider, MD  tiotropium (SPIRIVA) 18 MCG inhalation capsule Place 1 capsule (18 mcg total) into inhaler and inhale daily. 06/26/11   Elease EtienneAnand D Hongalgi, MD   Allergies  Allergen Reactions  . Ivp Dye [Iodinated Diagnostic Agents] Swelling  . Lisinopril Swelling  . Iohexol Itching and Swelling         FAMILY HISTORY:  No family history on file. SOCIAL HISTORY:  reports that she has been smoking Cigarettes.  She has a 20 pack-year smoking history. She has never used smokeless tobacco. She reports that she  does not drink alcohol or use illicit drugs.  REVIEW OF SYSTEMS:  B leg pain, some confusion, porr Po intake, nausea, emesis, diarrhea  SUBJECTIVE:  Starting to feel better than at time presentation  VITAL SIGNS: Temp:  [98.7 F (37.1 C)] 98.7 F (37.1 C) (06/24 0055) Pulse Rate:  [74-78] 74 (06/24 0115) Resp:  [10-17] 17 (06/24 0115) BP: (79-121)/(37-62) 85/62 mmHg (06/24 0115) SpO2:  [  96 %-98 %] 98 % (06/24 0115) HEMODYNAMICS:   VENTILATOR SETTINGS:   INTAKE / OUTPUT: Intake/Output   None     PHYSICAL EXAMINATION: General:  Ill appearing elderly woman, NAD Neuro:  Awake, alert, oriented to self and place, follows commands HEENT:  Op clear, no icterus,  Cardiovascular:  Regular 70, distant, no M  Lungs:  Very decreased, few basilar insp crackles Abdomen: mild diffuse tenderness Musculoskeletal:  No lesions Skin:  No rash  LABS:  CBC No results found for this basename: WBC, HGB, HCT, PLT,  in the last 168 hours Coag's No results found for this basename: APTT, INR,  in the last 168 hours BMET No results found for this basename: NA, K, CL, CO2, BUN, CREATININE, GLUCOSE,  in the last 168 hours Electrolytes No results found for this basename: CALCIUM, MG, PHOS,  in the last 168 hours Sepsis Markers No results found for this basename: LATICACIDVEN, PROCALCITON, O2SATVEN,  in the last 168 hours ABG No results found for this basename: PHART, PCO2ART, PO2ART,  in the last 168 hours Liver Enzymes No results found for this basename: AST, ALT, ALKPHOS, BILITOT, ALBUMIN,  in the last 168 hours Cardiac Enzymes No results found for this basename: TROPONINI, PROBNP,  in the last 168 hours Glucose  Recent Labs Lab 11/24/13 0053  GLUCAP 122*    Imaging No results found.   ASSESSMENT / PLAN:  PULMONARY A: severe copd, without evidence AE hypoxemia P:   Hold spiriva (unclear whether she was actually taking) Start scheduled nebs No steroids at this  time  CARDIOVASCULAR A: shock, suspect hypovolemic due to her GI illness + HD today. She is unclear how much fluid removal there was, only that she has felt bad since the HD. Consider also sepsis.  CAD HTN Chronic Ischemic CHF Pacemaker hyperlipidemia P:  IVF bolus gently given hx CHF Wean norepi as able  RENAL A:  CKD on HD P:   Will notify Renal of her admission in the am Follow electrolytes  GASTROINTESTINAL A:  Recent gastroenteritis Acid prophylaxis P:   PPI  HEMATOLOGIC A:  DVT prophylaxis P:  Heparin   INFECTIOUS A:  Unclear whether there is an infectious focus P:   Must treat for possible septic shock, d/c abx if suspicion for infxn decreases.  Cefepime + vanco, dosed for renal failure.  ENDOCRINE A:  DM P:   ssi protocol Continue home synthroid dose Check thyroid panel, cortisol  NEUROLOGIC A:  Mild encephalopathy, suspect metabolic. [facility] P:   Supportive care  TODAY'S SUMMARY:   I have personally obtained a history, examined the patient, evaluated laboratory and imaging results, formulated the assessment and plan and placed orders. CRITICAL CARE: The patient is critically ill with multiple organ systems failure and requires high complexity decision making for assessment and support, frequent evaluation and titration of therapies, application of advanced monitoring technologies and extensive interpretation of multiple databases. Critical Care Time devoted to patient care services described in this note is 50 minutes.   Levy Pupaobert Byrum, MD, PhD 11/24/2013, 1:51 AM [place] [facility] [phone] or if no answer [phone]

## 2013-11-24 NOTE — Progress Notes (Signed)
Spoke with Dr. Hyman HopesWebb about the pt's concern for having too much fluid removed during her last dialysis treatment. MD to pt that her hypotension and her malaise could be related to positive blood cultures and not dialysis. Will continue to monitor.

## 2013-11-24 NOTE — Progress Notes (Signed)
CRITICAL VALUE ALERT  Critical value received:  BC + Gram cocci in pairs and chains  Date of notification:  11/24/13  Time of notification:  1430  Critical value read back:yes  Nurse who received alert:  Paticia Stackyesha   MD notified (1st page):  Dr. Delton CoombesByrum  Time of first page:  1431  MD notified (2nd page):  Time of second page:  Responding MD:  Dr. Delton CoombesByrum  Time MD responded:  [phone]

## 2013-11-24 NOTE — Consult Note (Signed)
I have seen and examined this patient and agree with the plan of care. Some hypotension after dialysis yesterday.  WEBB,MARTIN W 11/24/2013, 1:02 PM

## 2013-11-24 NOTE — Progress Notes (Signed)
PULMONARY / CRITICAL CARE MEDICINE   Name: [name] MRN: [id number] DOB: [birth year]    ADMISSION DATE:  11/24/2013  [facility]ING MD :  [facility] PRIMARY SERVICE: PCCM  CHIEF COMPLAINT:  B LE pain  BRIEF PATIENT DESCRIPTION: 80 yo smoker w CAD/CABG, ischemic CM / CHF, ESRD, DM, severe COPD. Was well until developed B thigh and pelvis pain after HD on 6/23.  [facility]    SIGNIFICANT EVENTS / STUDIES:  CT head 6/23 >> no acute abnormalities CT abd/pelvis 6/23 >> renal atrophy, rectal wall thickening ? Significance, extensive athlerosclerosis CXR 6/23 [facility]([place]) >> cardiomegaly, pacer, minimal perihilar infiltrates w little change from priors  LINES / TUBES: L femoral CVC 6/23 >>   CULTURES: Blood 6/23 >>   ANTIBIOTICS: vanco 6/24 >>  Cefepime 6/24 >>   HISTORY OF PRESENT ILLNESS:  80 yo smoker w CAD/CABG, ischemic CM / CHF, ESRD, DM, severe COPD. She has been experiencing nausea, vomiting since middle of last week, was initially constipated but then had diarrhea through the weekend. Her Po intake has been decreased and she missed HD once last week. She felt well before going to HD on 6/23, but then experienced severe LE pain and cramping after she got home. This prompted her to go to the ED. She was found to be hypotensive, received ABX + IVF, [facility].   SUBJECTIVE:  Pressors off this am  C/o pain R hip / buttock  VITAL SIGNS: Temp:  [98.1 F (36.7 C)-98.7 F (37.1 C)] 98.1 F (36.7 C) (06/24 0838) Pulse Rate:  [74-78] 76 (06/24 0800) Resp:  [8-20] 11 (06/24 0800) BP: (60-121)/(15-62) 112/50 mmHg (06/24 0800) SpO2:  [96 %-100 %] 99 % (06/24 0808) Weight:  [43.1 kg (95 lb 0.3 oz)-44.3 kg (97 lb 10.6 oz)] 44.3 kg (97 lb 10.6 oz) (06/24 0500) HEMODYNAMICS:   VENTILATOR SETTINGS:   INTAKE / OUTPUT: Intake/Output     06/23 0701 - 06/24 0700 06/24 0701 - 06/25 0700   I.V. (mL/kg) 241.5 (5.5) 157.2 (3.5)   IV  Piggyback 250    Total Intake(mL/kg) 491.5 (11.1) 157.2 (3.5)   Net +491.5 +157.2          PHYSICAL EXAMINATION: General:  Ill appearing elderly woman, NAD Neuro:  Awake, alert, oriented to self and place, follows commands HEENT:  Op clear, no icterus,  Cardiovascular:  Regular 70, distant, no M  Lungs:  Very decreased, few basilar insp crackles Abdomen: mild diffuse tenderness Musculoskeletal:  No lesions Skin:  No rash  LABS:  CBC  Recent Labs Lab 11/24/13 0200  WBC 14.1*  HGB 7.9*  HCT 25.0*  PLT 119*   Coag's  Recent Labs Lab 11/24/13 0200  INR 1.17   BMET  Recent Labs Lab 11/24/13 0200  NA 136*  K 3.9  CL 96  CO2 25  BUN 31*  CREATININE 3.51*  GLUCOSE 109*   Electrolytes  Recent Labs Lab 11/24/13 0200  CALCIUM 7.4*  MG 1.6  PHOS 3.8   Sepsis Markers  Recent Labs Lab 11/24/13 0200  PROCALCITON 59.14   ABG No results found for this basename: PHART, PCO2ART, PO2ART,  in the last 168 hours Liver Enzymes  Recent Labs Lab 11/24/13 0200  AST 43*  ALT 32  ALKPHOS 150*  BILITOT 0.3  ALBUMIN 1.9*   Cardiac Enzymes  Recent Labs Lab 11/24/13 0200 11/24/13 0731  TROPONINI <0.30 <0.30   Glucose  Recent Labs Lab  11/24/13 0053 11/24/13 0752  GLUCAP 122* 96    Imaging No results found.   ASSESSMENT / PLAN:  PULMONARY A: severe copd, without evidence AE hypoxemia P:   Hold spiriva (unclear whether she was actually taking) Scheduled nebs No steroids at this time  CARDIOVASCULAR A: shock, suspect hypovolemic due to her GI illness + HD 6/23. She is unclear how much fluid removal there was, only that she has felt bad since the HD. Consider also sepsis.  CAD HTN Chronic Ischemic CHF Pacemaker hyperlipidemia P:  IVF bolus gently given hx CHF Norepi off BP regimen held  RENAL A:  CKD on HD P:   Will notify Renal of her admission in the am Follow electrolytes  GASTROINTESTINAL A:  Recent gastroenteritis Acid  prophylaxis P:   PPI  HEMATOLOGIC A:  DVT prophylaxis P:  Heparin   INFECTIOUS A:  Unclear whether there is an infectious focus P:   Must treat for possible septic shock, will d/c if no source identified Cefepime + vanco, dosed for renal failure.  ENDOCRINE A:  DM Hypothyroidism  > TSH 6.38, improved from 2 weeks ago but still elevated P:   ssi protocol Continue same synthroid dose and follow TSH for normalization Check cortisol  NEUROLOGIC A:  Mild encephalopathy, suspect metabolic. [facility] P:   Supportive care  TODAY'S SUMMARY:   I have personally obtained a history, examined the patient, evaluated laboratory and imaging results, formulated the assessment and plan and placed orders. CRITICAL CARE: The patient is critically ill with multiple organ systems failure and requires high complexity decision making for assessment and support, frequent evaluation and titration of therapies, application of advanced monitoring technologies and extensive interpretation of multiple databases. Critical Care Time devoted to patient care services described in this note is 30 minutes.   Levy Pupaobert Byrum, MD, PhD 11/24/2013, 10:26 AM [place] [facility] [phone] or if no answer [phone]

## 2013-11-24 NOTE — Consult Note (Signed)
[facility] Renal Consultation Note  Indication for Consultation:  Management of ESRD/hemodialysis; anemia, hypertension/volume and secondary hyperparathyroidism  HPI: [name] is a 80 y.o. female with a history of hypertension, CAD, COPD, and ESRD on dialysis at the [facility], recently at [facility] 6/13-16 for pulmonary edema secondary to noncompliance with dialysis, who returned home from her treatment yesterday and had sudden onset of severe lower extremity pain, bilaterally from her pelvis to her knees, requiring transfer by EMS to [facility].  In the ED her CT showed only extensive atherosclerosis, and chest x-ray showed minimal perihilar infiltrates, unchanged from prior imaging, but she was found to be hypotensive with blood pressure as low as 60/15 and was transferred to [facility].  She currently has no complaints, and her blood pressure is stable on Levophed, but she will be given antibiotics for possible septic shock.  She missed dialysis on Monday 6/22 secondary to temporary diarrhea, but had her treatment yesterday, running only 2:58 of her 3:30 prescribed time and leaving 1.1 L over her dry weight.  She normally signs off dialysis early and during her two outpatient treatments last week ran only 2:31 and 1:46.  Dialysis Orders:  MWF @ [facility] 3:30       44.5 kg      2K/2Ca      350/A1.5     Profile 2       No Heparin     AVF @ LUA No Hectorol            Aranesp 40 mcg & Venofer 50 mg on Wed  Past Medical History  Diagnosis Date  . Carotid artery occlusion     Bilateral carotid artery disease  . Peripheral artery disease   . Arrhythmia     ? hx of atrial fibrillation  . CHF (congestive heart failure)     EF 25-30%.   . Hypertension   . Hyperlipidemia   . ESRD on hemodialysis 06/20/11    M, W, F; [place].  Started HD in Sept 2008  . AAA (abdominal aortic aneurysm)   . Stroke ` 2007  . Duodenal ulcer 2008  . S/P CABG (coronary  artery bypass graft)     CABG in 2008, had 3 MI's prior  . Depression   . Pneumonia   . COPD, severe   . Hypothyroidism   . Anemia   . Pacemaker    Past Surgical History  Procedure Laterality Date  . Thyroid surgery  2004  . Dg av dialysis  shunt access exist*l* or  03/2000    left upper arm  . Vaginal hysterectomy  1976  . Hemorrhoid surgery  1971  . Insert / replace / remove pacemaker  1990's    initial placement  . Insert / replace / remove pacemaker  05/2011    "changed out"  . Incisional hernia repair  10/2010    incarcerated  . Common iliac  06/2009    bilateral kissing stents  . Pseudoaneurysm repair  09/1999    right groin  . Av fistula repair  04/2007    left upper arm  . Cardiac catheterization  02/10, 03/12    Most recent showed 3 vessel CAD with patent grafts: SVG to LAD, SVG to PDA and SVG to OM  . Coronary angioplasty with stent placement  09/1999    "1"  . Coronary artery bypass graft  02/2007    CABG X3   No family history  on file.  Social History She continues to smoke cigarettes occasionally after a 20 pack-year smoking history, but denies any history of alcohol or illicit drug use.  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  Allergies  Allergen Reactions  . Ivp Dye [Iodinated Diagnostic Agents] Swelling  . Lisinopril Swelling  . Iohexol Itching and Swelling        Prior to Admission medications   Medication Sig Start Date End Date Taking? Authorizing Provider  ALPRAZolam Prudy Feeler) 0.5 MG tablet Take 0.5 mg by mouth 3 (three) times daily as needed for anxiety.   Yes Historical Provider, MD  aspirin EC 81 MG tablet Take 81 mg by mouth daily.   Yes Historical Provider, MD  atorvastatin (LIPITOR) 80 MG tablet Take 80 mg by mouth daily.   Yes Historical Provider, MD  calcium acetate (PHOSLO) 667 MG capsule Take 3,335 mg by mouth 3 (three) times daily with meals.    Yes Historical Provider, MD   cholecalciferol (VITAMIN D) 1000 UNITS tablet Take 1,000 Units by mouth daily.   Yes Historical Provider, MD  cinacalcet (SENSIPAR) 60 MG tablet Take 60 mg by mouth daily.   Yes Historical Provider, MD  dextromethorphan (DELSYM) 30 MG/5ML liquid Take 30 mg by mouth every 12 (twelve) hours as needed for cough.    Yes Historical Provider, MD  docusate sodium (COLACE) 100 MG capsule Take 100 mg by mouth daily.    Yes Historical Provider, MD  feeding supplement, RESOURCE BREEZE, (RESOURCE BREEZE) LIQD Take 1 Container by mouth 2 (two) times daily between meals. 11/16/13  Yes Hollice Espy, MD  Fiber, Guar Gum, CHEW Chew 1 each by mouth daily.   Yes Historical Provider, MD  folic acid (FOLVITE) 400 MCG tablet Take 400 mcg by mouth daily.   Yes Historical Provider, MD  furosemide (LASIX) 80 MG tablet Take 80 mg by mouth 2 (two) times daily.    Yes Historical Provider, MD  hydrALAZINE (APRESOLINE) 25 MG tablet Take 25 mg by mouth 3 (three) times daily.   Yes Historical Provider, MD  ipratropium (ATROVENT) 0.02 % nebulizer solution Take 0.5 mg by nebulization 2 (two) times daily as needed for wheezing or shortness of breath.   Yes Historical Provider, MD  levothyroxine (SYNTHROID, LEVOTHROID) 150 MCG tablet Take 150 mcg by mouth daily before breakfast.   Yes Historical Provider, MD  nitroGLYCERIN (NITROSTAT) 0.4 MG SL tablet Place 0.4 mg under the tongue every 5 (five) minutes as needed for chest pain.    Yes Historical Provider, MD  oxyCODONE-acetaminophen (PERCOCET) 7.5-325 MG per tablet Take 1 tablet by mouth every 8 (eight) hours as needed for pain.    Yes Historical Provider, MD  pramoxine-mineral oil-zinc (TUCKS) 1-12.5 % rectal ointment Place rectally 2 (two) times daily as needed for itching or hemorrhoids. 11/16/13  Yes Hollice Espy, MD  promethazine (PHENERGAN) 25 MG tablet Take 25 mg by mouth every 6 (six) hours as needed for nausea or vomiting.    Yes Historical Provider, MD  temazepam  (RESTORIL) 30 MG capsule Take 30 mg by mouth at bedtime as needed for sleep.    Yes Historical Provider, MD  tiotropium (SPIRIVA) 18 MCG inhalation capsule Place 1 capsule (18 mcg total) into inhaler and inhale daily. 06/26/11  Yes Elease Etienne, MD   Labs:  Results for orders placed during the hospital encounter of 11/24/13 (from the past 48 hour(s))  GLUCOSE, CAPILLARY  Status: Abnormal   Collection Time    11/24/13 12:53 AM      Result Value Ref Range   Glucose-Capillary 122 (*) 70 - 99 mg/dL   Comment 1 Documented in Chart     Comment 2 Notify RN    MRSA PCR SCREENING     Status: None   Collection Time    11/24/13 12:53 AM      Result Value Ref Range   MRSA by PCR NEGATIVE  NEGATIVE   Comment:            The GeneXpert MRSA Assay (FDA     approved for NASAL specimens     only), is one component of a     comprehensive MRSA colonization     surveillance program. It is not     intended to diagnose MRSA     infection nor to guide or     monitor treatment for     MRSA infections.  CBC     Status: Abnormal   Collection Time    11/24/13  2:00 AM      Result Value Ref Range   WBC 14.1 (*) 4.0 - 10.5 K/uL   RBC 2.45 (*) 3.87 - 5.11 MIL/uL   Hemoglobin 7.9 (*) 12.0 - 15.0 g/dL   HCT 25.0 (*) 36.0 - 46.0 %   MCV 102.0 (*) 78.0 - 100.0 fL   MCH 32.2  26.0 - 34.0 pg   MCHC 31.6  30.0 - 36.0 g/dL   RDW 16.6 (*) 11.5 - 15.5 %   Platelets 119 (*) 150 - 400 K/uL   Comment: SPECIMEN CHECKED FOR CLOTS     REPEATED TO VERIFY     PLATELET COUNT CONFIRMED BY SMEAR  PROCALCITONIN     Status: None   Collection Time    11/24/13  2:00 AM      Result Value Ref Range   Procalcitonin 59.14     Comment:            Interpretation:     PCT >= 10 ng/mL:     Important systemic inflammatory response,     almost exclusively due to severe bacterial     sepsis or septic shock.     (NOTE)             ICU PCT Algorithm               Non ICU PCT Algorithm        ----------------------------      ------------------------------             PCT < 0.25 ng/mL                 PCT < 0.1 ng/mL         Stopping of antibiotics            Stopping of antibiotics           strongly encouraged.               strongly encouraged.        ----------------------------     ------------------------------           PCT level decrease by               PCT < 0.25 ng/mL           >= 80% from peak PCT           OR PCT 0.25 - 0.5 ng/mL  Stopping of antibiotics                                                 encouraged.         Stopping of antibiotics               encouraged.        ----------------------------     ------------------------------           PCT level decrease by              PCT >= 0.25 ng/mL           < 80% from peak PCT            AND PCT >= 0.5 ng/mL            Continuing antibiotics                                                  encouraged.           Continuing antibiotics                encouraged.        ----------------------------     ------------------------------         PCT level increase compared          PCT > 0.5 ng/mL             with peak PCT AND              PCT >= 0.5 ng/mL             Escalation of antibiotics                                              strongly encouraged.          Escalation of antibiotics            strongly encouraged.  TROPONIN I     Status: None   Collection Time    11/24/13  2:00 AM      Result Value Ref Range   Troponin I <0.30  <0.30 ng/mL   Comment:            Due to the release kinetics of cTnI,     a negative result within the first hours     of the onset of symptoms does not rule out     myocardial infarction with certainty.     If myocardial infarction is still suspected,     repeat the test at appropriate intervals.  COMPREHENSIVE METABOLIC PANEL     Status: Abnormal   Collection Time    11/24/13  2:00 AM      Result Value Ref Range   Sodium 136 (*) 137 - 147 mEq/L   Potassium 3.9  3.7 - 5.3 mEq/L   Chloride 96   96 - 112 mEq/L   CO2 25  19 - 32 mEq/L   Glucose, Bld 109 (*) 70 - 99 mg/dL   BUN 31 (*) 6 - 23 mg/dL  Creatinine, Ser 3.51 (*) 0.50 - 1.10 mg/dL   Calcium 7.4 (*) 8.4 - 10.5 mg/dL   Total Protein 4.7 (*) 6.0 - 8.3 g/dL   Albumin 1.9 (*) 3.5 - 5.2 g/dL   AST 43 (*) 0 - 37 U/L   ALT 32  0 - 35 U/L   Alkaline Phosphatase 150 (*) 39 - 117 U/L   Total Bilirubin 0.3  0.3 - 1.2 mg/dL   GFR calc non Af Amer 11 (*) >90 mL/min   GFR calc Af Amer 13 (*) >90 mL/min   Comment: (NOTE)     The eGFR has been calculated using the CKD EPI equation.     This calculation has not been validated in all clinical situations.     eGFR's persistently <90 mL/min signify possible Chronic Kidney     Disease.  MAGNESIUM     Status: None   Collection Time    11/24/13  2:00 AM      Result Value Ref Range   Magnesium 1.6  1.5 - 2.5 mg/dL  PHOSPHORUS     Status: None   Collection Time    11/24/13  2:00 AM      Result Value Ref Range   Phosphorus 3.8  2.3 - 4.6 mg/dL  PROTIME-INR     Status: None   Collection Time    11/24/13  2:00 AM      Result Value Ref Range   Prothrombin Time 14.7  11.6 - 15.2 seconds   INR 1.17  0.00 - 1.49  TSH     Status: Abnormal   Collection Time    11/24/13  2:00 AM      Result Value Ref Range   TSH 6.380 (*) 0.350 - 4.500 uIU/mL  TROPONIN I     Status: None   Collection Time    11/24/13  7:31 AM      Result Value Ref Range   Troponin I <0.30  <0.30 ng/mL   Comment:            Due to the release kinetics of cTnI,     a negative result within the first hours     of the onset of symptoms does not rule out     myocardial infarction with certainty.     If myocardial infarction is still suspected,     repeat the test at appropriate intervals.  GLUCOSE, CAPILLARY     Status: None   Collection Time    11/24/13  7:52 AM      Result Value Ref Range   Glucose-Capillary 96  70 - 99 mg/dL   Constitutional: negative for chills, fatigue, fevers and sweats Ears, nose, mouth,  throat, and face: negative for earaches, hoarseness, nasal congestion and sore throat Respiratory: negative for cough, dyspnea on exertion, hemoptysis and sputum Cardiovascular: negative for chest pain, chest pressure/discomfort, dyspnea, orthopnea and palpitations Gastrointestinal: negative for abdominal pain, change in bowel habits, nausea and vomiting Genitourinary:negative, oliguric Musculoskeletal:negative for arthralgias, back pain, myalgias and neck pain Neurological: negative for dizziness, gait problems, headaches, paresthesia and speech problems  Physical Exam: Filed Vitals:   11/24/13 1045  BP: 95/39  Pulse: 74  Temp:   Resp: 18     General appearance: alert, cooperative and no distress Head: Normocephalic, without obvious abnormality, atraumatic Neck: no adenopathy, no carotid bruit, no JVD and supple, symmetrical, trachea midline Resp: Decreased breath sounds, bibasilar rales Cardio: regular rate and rhythm, S1, S2 normal, no murmur, click, rub or  gallop GI: soft, non-tender; bowel sounds normal; no masses,  no organomegaly Extremities: extremities normal, atraumatic, no cyanosis or edema Neurologic: Grossly normal Dialysis Access: AVF @ LUA with + bruit   Assessment/Plan: 1. Hypotension - initially with SBP in 60s, now stable (106/40) s/p IVF bolus, on Levophed; suspected hypovolemic shock s/p HD, but post-HD wt 1.1 L over EDW; CXR negative, afebrile, WBCs up to 14.1, Cefepime & Vancomycin started, BCs pending.  2. ESRD - HD on MWF @ [facility], K 3.9.  No urgent need, next HD likely tomorrow. 3. Hypotension/volume - EDW was lowered from 47.5 kg to 44.5 kg after last admission for pulmonary edema, but her wt post-HD yesterday was 45.6. 4. Anemia - Hgb 7.9 on Aranesp 40 mcg & Venofer on Wed. 5. Metabolic bone disease - Ca 7.4 (9.1 corrected), P 3.8, iPTH 225; Vitamin D3 1000 U qd, sesnipar 60 mg qd, Phoslo 5 with meals. 6. Nutrition - Alb 1.9, renal carb-mod diet,  vitamin. 7. DM - insulin per primary.  Les Longmore 11/24/2013, 11:59 AM   Attending Nephrologist:  Edrick Oh, MD

## 2013-11-24 NOTE — Progress Notes (Signed)
ANTIBIOTIC CONSULT NOTE - INITIAL  Pharmacy Consult for Vancomycin/Cefepime  Indication: rule out sepsis  Allergies  Allergen Reactions  . Ivp Dye [Iodinated Diagnostic Agents] Swelling  . Lisinopril Swelling  . Iohexol Itching and Swelling        Patient Measurements: ~44 kg  Vital Signs: Temp: 98.7 F (37.1 C) (06/24 0055) Temp src: Oral (06/24 0055) BP: 85/62 mmHg (06/24 0115) Pulse Rate: 74 (06/24 0115)  [facility]  WBC 13.8 Scr 3.5 (HD patient)  Medical History: Past Medical History  Diagnosis Date  . Carotid artery occlusion     Bilateral carotid artery disease  . Peripheral artery disease   . Arrhythmia     ? hx of atrial fibrillation  . CHF (congestive heart failure)     EF 25-30%.   . Hypertension   . Hyperlipidemia   . ESRD on hemodialysis 06/20/11    M, W, F; [address], [place].  Started HD in Sept 2008  . AAA (abdominal aortic aneurysm)   . Stroke ` 2007  . Duodenal ulcer 2008  . S/P CABG (coronary artery bypass graft)     CABG in 2008, had 3 MI's prior  . Depression   . Pneumonia   . COPD, severe   . Hypothyroidism   . Anemia   . Pacemaker     Assessment: 80 [facility]/o F [facility], starting antibiotics for r/o sepsis, WBC 13.8, previous notes say HD MWF, but MD note today says HD on 6/23 which is a Tuesday (will need to f/u).   Goal of Therapy:  Pre-HD vancomycin level 15-25 mg/L  Plan:  -Vancomycin 1000 mg IV x 1 now, then 500 mg qHD (will not schedule, will need to f/u HD days) -Cefepime 2g IV x 1 now, then 2g on HD days at 1800 (f/u HD days) -Trend WBC, temp, renal function  -Drug levels as indicated  Abran DukeLedford, James 11/24/2013,2:09 AM

## 2013-11-25 ENCOUNTER — Inpatient Hospital Stay (HOSPITAL_COMMUNITY): Payer: Medicare HMO

## 2013-11-25 DIAGNOSIS — I369 Nonrheumatic tricuspid valve disorder, unspecified: Secondary | ICD-10-CM

## 2013-11-25 LAB — CBC
HCT: 26.4 % — ABNORMAL LOW (ref 36.0–46.0)
HEMOGLOBIN: 8.3 g/dL — AB (ref 12.0–15.0)
MCH: 31.9 pg (ref 26.0–34.0)
MCHC: 31.4 g/dL (ref 30.0–36.0)
MCV: 101.5 fL — ABNORMAL HIGH (ref 78.0–100.0)
Platelets: 127 10*3/uL — ABNORMAL LOW (ref 150–400)
RBC: 2.6 MIL/uL — ABNORMAL LOW (ref 3.87–5.11)
RDW: 16.9 % — ABNORMAL HIGH (ref 11.5–15.5)
WBC: 8.3 10*3/uL (ref 4.0–10.5)

## 2013-11-25 LAB — BASIC METABOLIC PANEL
BUN: 38 mg/dL — AB (ref 6–23)
CALCIUM: 8.8 mg/dL (ref 8.4–10.5)
CHLORIDE: 99 meq/L (ref 96–112)
CO2: 23 meq/L (ref 19–32)
CREATININE: 4.2 mg/dL — AB (ref 0.50–1.10)
GFR calc non Af Amer: 9 mL/min — ABNORMAL LOW (ref >90)
GFR, EST AFRICAN AMERICAN: 11 mL/min — AB (ref >90)
Glucose, Bld: 81 mg/dL (ref 70–99)
Potassium: 4.6 mEq/L (ref 3.7–5.3)
Sodium: 136 mEq/L — ABNORMAL LOW (ref 137–147)

## 2013-11-25 LAB — GLUCOSE, CAPILLARY
Glucose-Capillary: 73 mg/dL (ref 70–99)
Glucose-Capillary: 95 mg/dL (ref 70–99)
Glucose-Capillary: 90 mg/dL (ref 70–99)
Glucose-Capillary: 85 mg/dL (ref 70–99)
Glucose-Capillary: 96 mg/dL (ref 70–99)

## 2013-11-25 LAB — MAGNESIUM: MAGNESIUM: 1.7 mg/dL (ref 1.5–2.5)

## 2013-11-25 LAB — PHOSPHORUS: Phosphorus: 4.8 mg/dL — ABNORMAL HIGH (ref 2.3–4.6)

## 2013-11-25 MED ORDER — VANCOMYCIN HCL 500 MG IV SOLR
500.0000 mg | INTRAVENOUS | Status: DC
  Administered 2013-11-26 – 2013-11-29 (×2): 500 mg via INTRAVENOUS
  Filled 2013-11-25 (×3): qty 500

## 2013-11-25 MED ORDER — BIOTENE DRY MOUTH MT LIQD
15.0000 mL | Freq: Two times a day (BID) | OROMUCOSAL | Status: DC
  Administered 2013-11-25 – 2013-12-03 (×14): 15 mL via OROMUCOSAL

## 2013-11-25 MED ORDER — ONDANSETRON HCL 4 MG/2ML IJ SOLN
INTRAMUSCULAR | Status: AC
  Administered 2013-11-25: 4 mg
  Filled 2013-11-25: qty 2

## 2013-11-25 MED ORDER — DEXTROSE 5 % IV SOLN
2.0000 g | INTRAVENOUS | Status: DC
  Administered 2013-11-26: 2 g via INTRAVENOUS
  Filled 2013-11-25: qty 2

## 2013-11-25 MED ORDER — ONDANSETRON HCL 4 MG/2ML IJ SOLN
4.0000 mg | Freq: Three times a day (TID) | INTRAMUSCULAR | Status: DC | PRN
Start: 2013-11-25 — End: 2013-12-03
  Administered 2013-11-26 – 2013-11-30 (×3): 4 mg via INTRAVENOUS
  Filled 2013-11-25 (×3): qty 2

## 2013-11-25 NOTE — Progress Notes (Signed)
Called Dr. Molli KnockYacoub with the updated blood culture results from [facility]( received by fax at 1800). Initial results positive for gram positive cocci in pairs and chains, with updated data that growth is suggestive of Enterococcus. ID and susceptibility to follow. Dr. Molli KnockYacoub reviewed patients antimicrobial coverage and was satisfied that the patient was adequately covered. Additionally I spoke with him about this patient's sudden drop in oxygen saturations. I have increased her O2 from 2L N/C to 4L N/C with little improvement. She is currently using an incentive spirometer. She is complaining of abdominal and back pain with nausea also, Dr. Molli KnockYacoub is aware.

## 2013-11-25 NOTE — Progress Notes (Signed)
[facility] ROUNDING NOTE   Subjective:   Interval History: feels better but positive blood cultures  Objective:  Vital signs in last 24 hours:  Temp:  [97.3 F (36.3 C)-98.5 F (36.9 C)] 97.3 F (36.3 C) (06/25 0854) Pulse Rate:  [73-91] 73 (06/25 1000) Resp:  [9-27] 16 (06/25 1000) BP: (94-134)/(34-90) 119/55 mmHg (06/25 1000) SpO2:  [96 %-100 %] 99 % (06/25 1000)  Weight change:  Filed Weights   11/24/13 0032 11/24/13 0500  Weight: 43.1 kg (95 lb 0.3 oz) 44.3 kg (97 lb 10.6 oz)    Intake/Output: I/O last 3 completed shifts: In: 1772.4 [I.V.:1522.4; IV Piggyback:250] Out: -    Intake/Output this shift:    AVF  Arm left hypertrophic no particularly inflammed appearing CVS- RRR RS- CTA ABD- BS present soft non-distended EXT- no edema   Basic Metabolic Panel:  Recent Labs Lab 11/24/13 0200 11/25/13 0434  NA 136* 136*  K 3.9 4.6  CL 96 99  CO2 25 23  GLUCOSE 109* 81  BUN 31* 38*  CREATININE 3.51* 4.20*  CALCIUM 7.4* 8.8  MG 1.6 1.7  PHOS 3.8 4.8*    Liver Function Tests:  Recent Labs Lab 11/24/13 0200  AST 43*  ALT 32  ALKPHOS 150*  BILITOT 0.3  PROT 4.7*  ALBUMIN 1.9*   No results found for this basename: LIPASE, AMYLASE,  in the last 168 hours No results found for this basename: AMMONIA,  in the last 168 hours  CBC:  Recent Labs Lab 11/24/13 0200 11/25/13 0434  WBC 14.1* 8.3  HGB 7.9* 8.3*  HCT 25.0* 26.4*  MCV 102.0* 101.5*  PLT 119* 127*    Cardiac Enzymes:  Recent Labs Lab 11/24/13 0200 11/24/13 0731 11/24/13 1315  TROPONINI <0.30 <0.30 <0.30    BNP: No components found with this basename: POCBNP,   CBG:  Recent Labs Lab 11/24/13 0752 11/24/13 1238 11/24/13 1800 11/24/13 2136 11/25/13 0835  GLUCAP 96 122* 107* 90 73    Microbiology: Results for orders placed during the hospital encounter of 11/24/13  MRSA PCR SCREENING     Status: None   Collection Time    11/24/13 12:53 AM      Result  Value Ref Range Status   MRSA by PCR NEGATIVE  NEGATIVE Final   Comment:            The GeneXpert MRSA Assay (FDA     approved for NASAL specimens     only), is one component of a     comprehensive MRSA colonization     surveillance program. It is not     intended to diagnose MRSA     infection nor to guide or     monitor treatment for     MRSA infections.  CULTURE, BLOOD (ROUTINE X 2)     Status: None   Collection Time    11/24/13  2:00 AM      Result Value Ref Range Status   Specimen Description BLOOD RIGHT HAND   Final   Special Requests BOTTLES DRAWN AEROBIC ONLY 1CC   Final   Culture  Setup Time     Final   Value: 11/24/2013 08:19     Performed at [facility]   Culture     Final   Value:        BLOOD CULTURE RECEIVED NO GROWTH TO DATE CULTURE WILL BE HELD FOR 5 DAYS BEFORE ISSUING A FINAL NEGATIVE REPORT     Performed  at [facility]   Report Status PENDING   Incomplete  CULTURE, BLOOD (ROUTINE X 2)     Status: None   Collection Time    11/24/13  2:31 AM      Result Value Ref Range Status   Specimen Description BLOOD RIGHT THUMB   Final   Special Requests BOTTLES DRAWN AEROBIC ONLY 3CC   Final   Culture  Setup Time     Final   Value: 11/24/2013 08:20     Performed at [facility]   Culture     Final   Value:        BLOOD CULTURE RECEIVED NO GROWTH TO DATE CULTURE WILL BE HELD FOR 5 DAYS BEFORE ISSUING A FINAL NEGATIVE REPORT     Performed at [facility]   Report Status PENDING   Incomplete    Coagulation Studies:  Recent Labs  11/24/13 0200  LABPROT 14.7  INR 1.17    Urinalysis: No results found for this basename: COLORURINE, APPERANCEUR, LABSPEC, PHURINE, GLUCOSEU, HGBUR, BILIRUBINUR, KETONESUR, PROTEINUR, UROBILINOGEN, NITRITE, LEUKOCYTESUR,  in the last 72 hours    Imaging: No results found.   Medications:   . sodium chloride 75 mL/hr (11/24/13 1600)  . norepinephrine (LEVOPHED) Adult infusion Stopped (11/24/13  1730)   . aspirin EC  81 mg Oral Daily  . atorvastatin  80 mg Oral Daily  . calcium acetate  3,335 mg Oral TID WC  . [START ON 11/26/2013] ceFEPime (MAXIPIME) IV  2 g Intravenous Q M,W,F-1800  . cholecalciferol  1,000 Units Oral Daily  . cinacalcet  60 mg Oral Q breakfast  . docusate sodium  300 mg Oral Daily  . feeding supplement (RESOURCE BREEZE)  1 Container Oral BID BM  . folic acid  0.5 mg Oral Daily  . heparin  5,000 Units Subcutaneous 3 times per day  . insulin aspart  0-5 Units Subcutaneous QHS  . insulin aspart  0-9 Units Subcutaneous TID WC  . ipratropium-albuterol  3 mL Nebulization Q6H  . levothyroxine  150 mcg Oral QAC breakfast  . pantoprazole  40 mg Oral Daily  . [START ON 11/26/2013] vancomycin  500 mg Intravenous Q M,W,F-HD   sodium chloride, acetaminophen, albuterol, dextromethorphan, ibuprofen, nitroGLYCERIN, pramoxine-mineral oil-zinc  Assessment/ Plan:  Assessment/Plan:  1. Hypotension - initially with SBP in 60s, now stable (106/40) positive cultures identified Cefepime & Vancomycin started, will need TEE 2. ESRD - HD on MWF  3. Hypotension/volume - no edema 4. Anemia - Hgb 8.3  on Aranesp 40 mcg & Venofer on Wed. 5. Metabolic bone disease - Ca 7.4 (9.1 corrected), P 3.8, iPTH 225; Vitamin D3 1000 U qd, sesnipar 60 mg qd, Phoslo 5 with meals. 6. Nutrition - Alb 1.9, renal carb-mod diet, vitamin. 7. DM - insulin per primary.  Plan dialysis in am   LOS: 1 [place] W @TODAY @12 :18 PM

## 2013-11-25 NOTE — Progress Notes (Signed)
  Echocardiogram 2D Echocardiogram has been performed.  [name], [name] 11/25/2013, 4:02 PM

## 2013-11-25 NOTE — Progress Notes (Addendum)
PULMONARY / CRITICAL CARE MEDICINE   Name: [name] MRN: [id number] DOB: 1932/08/10    ADMISSION DATE:  11/24/2013  REFERRING MD :  [facility] PRIMARY SERVICE: PCCM  CHIEF COMPLAINT:  B LE pain  BRIEF PATIENT DESCRIPTION: 80 yo smoker w CAD/CABG, ischemic CM / CHF, ESRD, DM, severe COPD. Was well until developed B thigh and pelvis pain after HD on 6/23.  [facility] with hypotension.    SIGNIFICANT EVENTS / STUDIES:  CT head 6/23 >> no acute abnormalities CT abd/pelvis 6/23 >> renal atrophy, rectal wall thickening ? Significance, extensive athlerosclerosis CXR 6/23 [facility]([place]) >> cardiomegaly, pacer, minimal perihilar infiltrates w little change from priors  LINES / TUBES: L femoral CVC 6/23 >>   CULTURES: Blood 6/23 >> GPC's >>   ANTIBIOTICS: vanco 6/24 >>  Cefepime 6/24 >>   HISTORY OF PRESENT ILLNESS:  80 yo smoker w CAD/CABG, ischemic CM / CHF, ESRD, DM, severe COPD. She has been experiencing nausea, vomiting since middle of last week, was initially constipated but then had diarrhea through the weekend. Her Po intake has been decreased and she missed HD once last week. She felt well before going to HD on 6/23, but then experienced severe LE pain and cramping after she got home. This prompted her to go to the ED. She was found to be hypotensive, received ABX + IVF, [facility].   SUBJECTIVE:  Improved , up to chair  VITAL SIGNS: Temp:  [97.3 F (36.3 C)-98.5 F (36.9 C)] 98.3 F (36.8 C) (06/25 1259) Pulse Rate:  [73-91] 73 (06/25 1000) Resp:  [9-27] 16 (06/25 1000) BP: (96-134)/(41-90) 119/55 mmHg (06/25 1000) SpO2:  [96 %-100 %] 99 % (06/25 1000) HEMODYNAMICS:   VENTILATOR SETTINGS:   INTAKE / OUTPUT: Intake/Output     06/24 0701 - 06/25 0700 06/25 0701 - 06/26 0700   I.V. (mL/kg) 1280.9 (28.9)    IV Piggyback     Total Intake(mL/kg) 1280.9 (28.9)    Net +1280.9          Stool Occurrence 1 x      PHYSICAL  EXAMINATION: General:  Ill appearing elderly woman, NAD Neuro:  Awake, alert, oriented to self and place, follows commands HEENT:  Op clear, no icterus,  Cardiovascular:  Regular 70, distant, no M  Lungs:  Very decreased, few basilar insp crackles Abdomen: mild diffuse tenderness Musculoskeletal:  No lesions Skin:  No rash  LABS:  CBC  Recent Labs Lab 11/24/13 0200 11/25/13 0434  WBC 14.1* 8.3  HGB 7.9* 8.3*  HCT 25.0* 26.4*  PLT 119* 127*   Coag's  Recent Labs Lab 11/24/13 0200  INR 1.17   BMET  Recent Labs Lab 11/24/13 0200 11/25/13 0434  NA 136* 136*  K 3.9 4.6  CL 96 99  CO2 25 23  BUN 31* 38*  CREATININE 3.51* 4.20*  GLUCOSE 109* 81   Electrolytes  Recent Labs Lab 11/24/13 0200 11/25/13 0434  CALCIUM 7.4* 8.8  MG 1.6 1.7  PHOS 3.8 4.8*   Sepsis Markers  Recent Labs Lab 11/24/13 0200  PROCALCITON 59.14   ABG No results found for this basename: PHART, PCO2ART, PO2ART,  in the last 168 hours Liver Enzymes  Recent Labs Lab 11/24/13 0200  AST 43*  ALT 32  ALKPHOS 150*  BILITOT 0.3  ALBUMIN 1.9*   Cardiac Enzymes  Recent Labs Lab 11/24/13 0200 11/24/13 0731 11/24/13 1315  TROPONINI <0.30 <0.30 <0.30   Glucose  Recent Labs Lab 11/24/13 0752 11/24/13 1238 11/24/13 1800 11/24/13 2136 11/25/13 0835 11/25/13 1220  GLUCAP 96 122* 107* 90 73 95    Imaging No results found.   ASSESSMENT / PLAN:  PULMONARY A: severe copd, without evidence AE hypoxemia P:   Hold spiriva (unclear whether she was actually taking), may decide to convert back to spiriva before discharge Scheduled nebs No steroids at this time  CARDIOVASCULAR A: shock, hypovolemic and septic CAD HTN Chronic Ischemic CHF Pacemaker hyperlipidemia P:  Pressors now off BP regimen held, restart when stable to do so  RENAL A:  CKD on HD P:   No HD on 6/25 per renal plans.  Discussed plans with Dr Hyman HopesWebb, she will likely need extended course outpt abx  at HD Follow electrolytes  GASTROINTESTINAL A:  Recent gastroenteritis Acid prophylaxis P:   PPI  HEMATOLOGIC A:  DVT prophylaxis P:  Heparin   INFECTIOUS A:  GPC bacteremia P:   Cefepime + vanco, dosed for renal failure. [facility] and sensitivities Needs TTE to r/o endocarditis; if negative consider TEE  ENDOCRINE A:  DM Hypothyroidism  > TSH 6.38, improved from 2 weeks ago but still elevated P:   ssi protocol Continue same synthroid dose and follow TSH for normalization Check cortisol  NEUROLOGIC A:  Mild encephalopathy, suspect metabolic. Head Ct [facility] P:   Supportive care  TODAY'S SUMMARY:  Treating GPC bacteremia, supposed source is HD. Will transfer to floor bed, continue current abx until speciated  I have personally obtained a history, examined the patient, evaluated laboratory and imaging results, formulated the assessment and plan and placed orders.   Levy Pupaobert Dionis Autry, MD, PhD 11/25/2013, 1:36 PM [facility] [phone] or if no answer [phone]

## 2013-11-25 NOTE — Progress Notes (Signed)
CXR obtained per order. PRN duoneb per RT. Breath sounds are rhonchus with intermittent wheezing which is a change from earlier today. Patients color is also ashen/gray. Sats have improved after the breathing treatment. We will continue to monitor carefully.

## 2013-11-26 DIAGNOSIS — I959 Hypotension, unspecified: Secondary | ICD-10-CM | POA: Diagnosis not present

## 2013-11-26 DIAGNOSIS — B952 Enterococcus as the cause of diseases classified elsewhere: Secondary | ICD-10-CM | POA: Diagnosis present

## 2013-11-26 DIAGNOSIS — R7881 Bacteremia: Secondary | ICD-10-CM | POA: Diagnosis present

## 2013-11-26 DIAGNOSIS — T827XXA Infection and inflammatory reaction due to other cardiac and vascular devices, implants and grafts, initial encounter: Secondary | ICD-10-CM | POA: Diagnosis not present

## 2013-11-26 LAB — GLUCOSE, CAPILLARY
GLUCOSE-CAPILLARY: 88 mg/dL (ref 70–99)
GLUCOSE-CAPILLARY: 92 mg/dL (ref 70–99)
Glucose-Capillary: 74 mg/dL (ref 70–99)
Glucose-Capillary: 77 mg/dL (ref 70–99)
Glucose-Capillary: 105 mg/dL — ABNORMAL HIGH (ref 70–99)

## 2013-11-26 LAB — CBC
HEMATOCRIT: 27.4 % — AB (ref 36.0–46.0)
HEMOGLOBIN: 8.6 g/dL — AB (ref 12.0–15.0)
MCH: 32.5 pg (ref 26.0–34.0)
MCHC: 31.4 g/dL (ref 30.0–36.0)
MCV: 103.4 fL — ABNORMAL HIGH (ref 78.0–100.0)
Platelets: 130 10*3/uL — ABNORMAL LOW (ref 150–400)
RBC: 2.65 MIL/uL — AB (ref 3.87–5.11)
RDW: 16.6 % — ABNORMAL HIGH (ref 11.5–15.5)
WBC: 7.7 10*3/uL (ref 4.0–10.5)

## 2013-11-26 LAB — RENAL FUNCTION PANEL
Albumin: 2.1 g/dL — ABNORMAL LOW (ref 3.5–5.2)
BUN: 48 mg/dL — AB (ref 6–23)
CO2: 22 meq/L (ref 19–32)
Calcium: 10.1 mg/dL (ref 8.4–10.5)
Chloride: 97 mEq/L (ref 96–112)
Creatinine, Ser: 5.01 mg/dL — ABNORMAL HIGH (ref 0.50–1.10)
GFR calc non Af Amer: 7 mL/min — ABNORMAL LOW (ref >90)
GFR, EST AFRICAN AMERICAN: 9 mL/min — AB (ref >90)
GLUCOSE: 75 mg/dL (ref 70–99)
PHOSPHORUS: 5.3 mg/dL — AB (ref 2.3–4.6)
Potassium: 5.3 mEq/L (ref 3.7–5.3)
SODIUM: 135 meq/L — AB (ref 137–147)

## 2013-11-26 NOTE — Progress Notes (Signed)
PULMONARY / CRITICAL CARE MEDICINE   Name: [name] MRN: [id number] DOB: 05/05/1933    ADMISSION DATE:  11/24/2013  REFERRING MD :  [facility] PRIMARY SERVICE: PCCM  CHIEF COMPLAINT:  B LE pain  BRIEF PATIENT DESCRIPTION: 80 yo smoker w CAD/CABG, ischemic CM / CHF, ESRD, DM, severe COPD. Was well until developed B thigh and pelvis pain after HD on 6/23.  [facility].    SIGNIFICANT EVENTS / STUDIES:  CT head 6/23 >> no acute abnormalities CT abd/pelvis 6/23 >> renal atrophy, rectal wall thickening ? Significance, extensive athlerosclerosis CXR 6/23 Duke [facility]([place]) >> cardiomegaly, pacer, minimal perihilar infiltrates w little change from priors TTE 6/25 >> LVH, mod-to-severe decrease LV fxn, severe TR and moderate MR, no clear-cut vegetations  LINES / TUBES: L femoral CVC 6/23 >>   CULTURES: Blood 6/23 Duke Salvia([place]) >> GPC's >> enterococcus >>  Blood 6/24 ([facility]) >> GPC chains >>   ANTIBIOTICS: vanco 6/24 >>  Cefepime 6/24 >>   HISTORY OF PRESENT ILLNESS:  80 yo smoker w CAD/CABG, ischemic CM / CHF, ESRD, DM, severe COPD. She has been experiencing nausea, vomiting since middle of last week, was initially constipated but then had diarrhea through the weekend. Her Po intake has been decreased and she missed HD once last week. She felt well before going to HD on 6/23, but then experienced severe LE pain and cramping after she got home. This prompted her to go to the ED. She was found to be hypotensive, received ABX + IVF, [facility].   SUBJECTIVE:  More dyspnea and hypoxemia yesterday pm, O2 needs increased Getting HD this am Blood cx as above  VITAL SIGNS: Temp:  [97.3 F (36.3 C)-98.7 F (37.1 C)] 98.5 F (36.9 C) (06/26 0714) Pulse Rate:  [73-91] 82 (06/26 0800) Resp:  [10-27] 12 (06/26 0800) BP: (113-149)/(47-113) 122/54 mmHg (06/26 0800) SpO2:  [88 %-100 %] 99 % (06/26 0800) Weight:  [49.7 kg (109 lb 9.1 oz)-50.2 kg  (110 lb 10.7 oz)] 50.2 kg (110 lb 10.7 oz) (06/26 0714) HEMODYNAMICS:   VENTILATOR SETTINGS:   INTAKE / OUTPUT: Intake/Output     06/25 0701 - 06/26 0700 06/26 0701 - 06/27 0700   P.O. 540 120   I.V. (mL/kg) 20 (0.4)    Total Intake(mL/kg) 560 (11.3) 120 (2.4)   Net +560 +120        Stool Occurrence 1 x      PHYSICAL EXAMINATION: General:  Ill appearing elderly woman, NAD Neuro:  Awake, alert, oriented to self and place, follows commands HEENT:  Op clear, no icterus,  Cardiovascular:  Regular 70, distant, no M  Lungs:  Very decreased, few basilar insp crackles Abdomen: mild diffuse tenderness Musculoskeletal:  No lesions Skin:  No rash  LABS:  CBC  Recent Labs Lab 11/24/13 0200 11/25/13 0434 11/26/13 0722  WBC 14.1* 8.3 7.7  HGB 7.9* 8.3* 8.6*  HCT 25.0* 26.4* 27.4*  PLT 119* 127* 130*   Coag's  Recent Labs Lab 11/24/13 0200  INR 1.17   BMET  Recent Labs Lab 11/24/13 0200 11/25/13 0434 11/26/13 0722  NA 136* 136* 135*  K 3.9 4.6 5.3  CL 96 99 97  CO2 25 23 22   BUN 31* 38* 48*  CREATININE 3.51* 4.20* 5.01*  GLUCOSE 109* 81 75   Electrolytes  Recent Labs Lab 11/24/13 0200 11/25/13 0434 11/26/13 0722  CALCIUM 7.4* 8.8 10.1  MG 1.6 1.7  --  PHOS 3.8 4.8* 5.3*   Sepsis Markers  Recent Labs Lab 11/24/13 0200  PROCALCITON 59.14   ABG No results found for this basename: PHART, PCO2ART, PO2ART,  in the last 168 hours Liver Enzymes  Recent Labs Lab 11/24/13 0200 11/26/13 0722  AST 43*  --   ALT 32  --   ALKPHOS 150*  --   BILITOT 0.3  --   ALBUMIN 1.9* 2.1*   Cardiac Enzymes  Recent Labs Lab 11/24/13 0200 11/24/13 0731 11/24/13 1315  TROPONINI <0.30 <0.30 <0.30   Glucose  Recent Labs Lab 11/25/13 0835 11/25/13 1220 11/25/13 1553 11/25/13 2101 11/25/13 2209 11/26/13 0804  GLUCAP 73 95 90 85 96 88    Imaging Dg Chest Port 1 View  11/25/2013   CLINICAL DATA:  Hypoxemia  EXAM: PORTABLE CHEST - 1 VIEW  COMPARISON:   11/23/2013  FINDINGS: Diffuse mild bilateral interstitial thickening. No pleural effusion or pneumothorax. Enlargement of the central pulmonary vasculature. Stable cardiomegaly. Prior CABG. Dual lead AICD. Unremarkable osseous structures.  The osseous structures are unremarkable.  IMPRESSION: Mild pulmonary venous congestion.   Electronically Signed   By: Elige KoHetal  Patel   On: 11/25/2013 18:56     ASSESSMENT / PLAN:  PULMONARY A: severe copd, without evidence AE; no current wheezing hypoxemia P:   Hold spiriva (unclear whether she was actually taking), may decide to convert back to spiriva before discharge Scheduled nebs No steroids at this time Will benefit from volume removal   CARDIOVASCULAR A: shock, hypovolemic and septic CAD HTN Chronic Ischemic CHF Pacemaker hyperlipidemia P:  Pressors now off BP regimen held, restart when stable to do so > normotensive now off meds  RENAL A:  CKD on HD P:   HD on 6/26 per renal plans.  Discussed plans with Dr Hyman HopesWebb, she will likely need extended course outpt abx at HD Follow electrolytes  GASTROINTESTINAL A:  Recent gastroenteritis Acid prophylaxis P:   PPI  HEMATOLOGIC A:  DVT prophylaxis P:  Heparin   INFECTIOUS A:  GPC bacteremia > enterococcus P:   Cefepime + vanco, dosed for renal failure. At some risk for VRE although afebrile and improving sepsis. Will not change to linezolid at this time, await cx data Will arrange for TEE   ENDOCRINE A:  DM Hypothyroidism  > TSH 6.38, improved from 2 weeks ago but still elevated Random Cortisol 20 P:   ssi protocol Continue same synthroid dose and follow TSH for normalization  NEUROLOGIC A:  Mild encephalopathy, suspect metabolic. [facility] P:   Supportive care  TODAY'S SUMMARY:  Treating enterococcal bacteremia, supposed source is HD. Reassess after HD today to see if appropriate for floor bed. Arranging for TEE to better eval for endocarditis  I  have personally obtained a history, examined the patient, evaluated laboratory and imaging results, formulated the assessment and plan and placed orders.   Levy Pupaobert Byrum, MD, PhD 11/26/2013, 8:47 AM [place] [facility] [phone] or if no answer [phone]

## 2013-11-26 NOTE — Progress Notes (Signed)
PCCM Interval Note  I reviewed the data and the case with Dr Hyman HopesWebb who knows Ms Chestine SporeClark well. Best strategy here is probably to treat for presumed endocarditis with abx tailored to the sensitivities of the enterococcus. Vanco at HD would be the simplest regimen. If it is VRE then linezolid PO would be the choice. I will cancel the TEE and we will plan to treat w prolonged abx.   Levy Pupaobert Byrum, MD, PhD 11/26/2013, 9:43 AM  Pulmonary [facility] Critical Care [phone] or if no answer [phone]

## 2013-11-26 NOTE — Procedures (Signed)
I have seen and examined this patient and agree with the plan of care . Seen on dialysis no complaints  WEBB,MARTIN W 11/26/2013, 10:25 AM

## 2013-11-27 DIAGNOSIS — R1012 Left upper quadrant pain: Secondary | ICD-10-CM

## 2013-11-27 DIAGNOSIS — B952 Enterococcus as the cause of diseases classified elsewhere: Secondary | ICD-10-CM

## 2013-11-27 DIAGNOSIS — R7881 Bacteremia: Secondary | ICD-10-CM

## 2013-11-27 DIAGNOSIS — Z95810 Presence of automatic (implantable) cardiac defibrillator: Secondary | ICD-10-CM | POA: Diagnosis present

## 2013-11-27 DIAGNOSIS — F341 Dysthymic disorder: Secondary | ICD-10-CM

## 2013-11-27 LAB — GLUCOSE, CAPILLARY
GLUCOSE-CAPILLARY: 79 mg/dL (ref 70–99)
GLUCOSE-CAPILLARY: 104 mg/dL — AB (ref 70–99)
Glucose-Capillary: 81 mg/dL (ref 70–99)
Glucose-Capillary: 93 mg/dL (ref 70–99)

## 2013-11-27 LAB — BASIC METABOLIC PANEL
BUN: 22 mg/dL (ref 6–23)
CO2: 27 mEq/L (ref 19–32)
Calcium: 10.1 mg/dL (ref 8.4–10.5)
Chloride: 99 mEq/L (ref 96–112)
Creatinine, Ser: 3.06 mg/dL — ABNORMAL HIGH (ref 0.50–1.10)
GFR calc non Af Amer: 13 mL/min — ABNORMAL LOW (ref >90)
GFR, EST AFRICAN AMERICAN: 16 mL/min — AB (ref >90)
GLUCOSE: 76 mg/dL (ref 70–99)
POTASSIUM: 4.4 meq/L (ref 3.7–5.3)
SODIUM: 139 meq/L (ref 137–147)

## 2013-11-27 LAB — PROTIME-INR
INR: 1.03 (ref 0.00–1.49)
PROTHROMBIN TIME: 13.5 s (ref 11.6–15.2)

## 2013-11-27 LAB — MAGNESIUM: MAGNESIUM: 1.7 mg/dL (ref 1.5–2.5)

## 2013-11-27 LAB — PROCALCITONIN: Procalcitonin: 54.3 ng/mL

## 2013-11-27 LAB — PHOSPHORUS: PHOSPHORUS: 3 mg/dL (ref 2.3–4.6)

## 2013-11-27 MED ORDER — DARBEPOETIN ALFA-POLYSORBATE 100 MCG/0.5ML IJ SOLN
100.0000 ug | INTRAMUSCULAR | Status: DC
  Administered 2013-11-29: 100 ug via INTRAVENOUS
  Filled 2013-11-27: qty 0.5

## 2013-11-27 MED ORDER — SORBITOL 70 % SOLN
30.0000 mL | Freq: Every day | Status: DC | PRN
  Administered 2013-11-30 – 2013-12-01 (×2): 30 mL via ORAL
  Filled 2013-11-27 (×2): qty 30

## 2013-11-27 MED ORDER — GENTAMICIN SULFATE 40 MG/ML IJ SOLN
40.0000 mg | INTRAMUSCULAR | Status: DC
  Administered 2013-11-29: 40 mg via INTRAVENOUS
  Filled 2013-11-27: qty 1

## 2013-11-27 MED ORDER — DOCUSATE SODIUM 100 MG PO CAPS
200.0000 mg | ORAL_CAPSULE | Freq: Two times a day (BID) | ORAL | Status: DC
  Administered 2013-11-27 – 2013-12-02 (×9): 200 mg via ORAL
  Filled 2013-11-27 (×15): qty 2

## 2013-11-27 MED ORDER — OXYCODONE-ACETAMINOPHEN 5-325 MG PO TABS
1.0000 | ORAL_TABLET | Freq: Three times a day (TID) | ORAL | Status: DC | PRN
  Administered 2013-11-27 – 2013-12-03 (×11): 1 via ORAL
  Filled 2013-11-27 (×11): qty 1

## 2013-11-27 MED ORDER — GENTAMICIN IN SALINE 1.6-0.9 MG/ML-% IV SOLN
80.0000 mg | Freq: Once | INTRAVENOUS | Status: AC
  Administered 2013-11-27: 80 mg via INTRAVENOUS
  Filled 2013-11-27: qty 50

## 2013-11-27 NOTE — Consult Note (Signed)
[facility]    Date of Admission:  11/24/2013           Day 5 vancomycin        Day 5 ceftriaxone       Reason for Consult: Automatic infectious disease consultation for enterococcal bacteremia      Principal Problem:   Bacteremia due to Enterococcus Active Problems:   AICD (automatic cardioverter/defibrillator) present   Coronary artery disease   COPD (chronic obstructive pulmonary disease)   ESRD (end stage renal disease) on dialysis   Unspecified hypothyroidism   Shock   Abdominal pain, left upper quadrant   . antiseptic oral rinse  15 mL Mouth Rinse BID  . aspirin EC  81 mg Oral Daily  . atorvastatin  80 mg Oral Daily  . ceFEPime (MAXIPIME) IV  2 g Intravenous Q M,W,F-1800  . cinacalcet  60 mg Oral Q breakfast  . [START ON 11/29/2013] darbepoetin (ARANESP) injection - DIALYSIS  100 mcg Intravenous Q Mon-HD  . docusate sodium  200 mg Oral BID  . feeding supplement (RESOURCE BREEZE)  1 Container Oral BID BM  . folic acid  0.5 mg Oral Daily  . heparin  5,000 Units Subcutaneous 3 times per day  . insulin aspart  0-5 Units Subcutaneous QHS  . insulin aspart  0-9 Units Subcutaneous TID WC  . ipratropium-albuterol  3 mL Nebulization Q6H  . levothyroxine  150 mcg Oral QAC breakfast  . pantoprazole  40 mg Oral Daily  . vancomycin  500 mg Intravenous Q M,W,F-HD    Recommendations: 1. Continue vancomycin 2. Start synergistic dosing of gentamicin 3. Discontinue ceftriaxone 4. Repeat blood cultures   Assessment: [name] has enterococcal bacteremia and what appears to be no, severe tricuspid regurgitation on a limited quality TTE. I suspect that she has right-sided endocarditis that may involve her AICD leads. Given that this is likely to be difficult to cure without removal of the AICD I would recommend to drug therapy with vancomycin and gentamycin dose after hemodialysis.    HPI: [name] is a 80 y.o. female who recently  developed leg pain after hemodialysis and [facility]. She was found to have hypotension and transferred here. Both sets of blood cultures done today her have grown fully sensitive Enterococcus faecalis. One of 2 blood cultures here is also positive. Her leg pain is resolved and she is feeling better. She denies having any recent fever, chills or sweats. She is very eager to go home. She is not very interested in being examined today.   Review of Systems: Review of systems not obtained due to patient factors.  Past Medical History  Diagnosis Date  . Carotid artery occlusion     Bilateral carotid artery disease  . Peripheral artery disease   . Arrhythmia     ? hx of atrial fibrillation  . CHF (congestive heart failure)     EF 25-30%.   . Hypertension   . Hyperlipidemia   . ESRD on hemodialysis 06/20/11    M, W, F; [address], [place].  Started HD in Sept 2008  . AAA (abdominal aortic aneurysm)   . Stroke ` 2007  . Duodenal ulcer 2008  . S/P CABG (coronary artery bypass graft)     CABG in 2008, had 3 MI's prior  . Depression   . Pneumonia   . COPD, severe   . Hypothyroidism   .  Anemia   . Pacemaker     History  Substance Use Topics  . Smoking status: Current Every Day Smoker -- 0.50 packs/day for 40 years    Types: Cigarettes  . Smokeless tobacco: Never Used  . Alcohol Use: No    No family history on file. Allergies  Allergen Reactions  . Ivp Dye [Iodinated Diagnostic Agents] Swelling  . Lisinopril Swelling  . Iohexol Itching and Swelling         OBJECTIVE: Blood pressure 124/62, pulse 75, temperature 98.5 F (36.9 C), temperature source Oral, resp. rate 16, height 5\' 3"  (1.6 m), weight 50.213 kg (110 lb 11.2 oz), SpO2 99.00%. General: She is upset about being in the hospital but otherwise in no distress Skin: Scattered ecchymoses on her arms and legs. No splinter or conjunctival hemorrhages noted Lungs: Clear Cor: Regular  S1 and S2. 2/6 systolic murmur heard at left lower sternal border Chest wall: No abnormality noted around right anterior chest AICD Abdomen: Soft and nontender Joints and extremities: No acute abnormalities noted  Lab Results Lab Results  Component Value Date   WBC 7.7 11/26/2013   HGB 8.6* 11/26/2013   HCT 27.4* 11/26/2013   MCV 103.4* 11/26/2013   PLT 130* 11/26/2013    Lab Results  Component Value Date   CREATININE 3.06* 11/27/2013   BUN 22 11/27/2013   NA 139 11/27/2013   K 4.4 11/27/2013   CL 99 11/27/2013   CO2 27 11/27/2013    Lab Results  Component Value Date   ALT 32 11/24/2013   AST 43* 11/24/2013   ALKPHOS 150* 11/24/2013   BILITOT 0.3 11/24/2013     Microbiology: Recent Results (from the past 240 hour(s))  MRSA PCR SCREENING     Status: None   Collection Time    11/24/13 12:53 AM      Result Value Ref Range Status   MRSA by PCR NEGATIVE  NEGATIVE Final   Comment:            The GeneXpert MRSA Assay (FDA     approved for NASAL specimens     only), is one component of a     comprehensive MRSA colonization     surveillance program. It is not     intended to diagnose MRSA     infection nor to guide or     monitor treatment for     MRSA infections.  CULTURE, BLOOD (ROUTINE X 2)     Status: None   Collection Time    11/24/13  2:00 AM      Result Value Ref Range Status   Specimen Description BLOOD RIGHT HAND   Final   Special Requests BOTTLES DRAWN AEROBIC ONLY 1CC   Final   Culture  Setup Time     Final   Value: 11/24/2013 08:19     Performed at [facility]   Culture     Final   Value: ENTEROCOCCUS SPECIES     Note: Gram Stain Report Called to,Read Back By and Verified With: SARA GROCE 11/26/13 0900 BY SMITHERSJ     Performed at [facility]   Report Status PENDING   Incomplete  CULTURE, BLOOD (ROUTINE X 2)     Status: None   Collection Time    11/24/13  2:31 AM      Result Value Ref Range Status   Specimen Description BLOOD RIGHT THUMB   Final    Special Requests BOTTLES DRAWN AEROBIC ONLY 3CC  Final   Culture  Setup Time     Final   Value: 11/24/2013 08:20     Performed at [facility]   Culture     Final   Value:        BLOOD CULTURE RECEIVED NO GROWTH TO DATE CULTURE WILL BE HELD FOR 5 DAYS BEFORE ISSUING A FINAL NEGATIVE REPORT     Performed at [facility]   Report Status PENDING   Incomplete   TTE Study Conclusions 11/25/2013   Left ventricle: There is a systolic jet just below the AV in the parasternal long axis which most likely represents off axis severe TR but cannot rule out a VSD. The cavity size was normal.  Carolanne Grumblingracy Turner M.D.   Cliffton AstersJohn Campbell, MD [facility] [phone] pager   [phone] cell 11/27/2013, 1:50 PM

## 2013-11-27 NOTE — Plan of Care (Signed)
Problem: Phase I Progression Outcomes Goal: Voiding-avoid urinary catheter unless indicated Outcome: Not Applicable Date Met:  88/33/74 Patient is anuric.

## 2013-11-27 NOTE — Progress Notes (Signed)
I have seen and examined this patient and agree with the plan of care  [facility] 11/27/2013, 11:51 AM

## 2013-11-27 NOTE — Progress Notes (Signed)
PULMONARY / CRITICAL CARE MEDICINE   Name: [name] MRN: [id number] DOB: February 06, 1933    ADMISSION DATE:  11/24/2013  REFERRING MD :  [facility] PRIMARY SERVICE: PCCM  CHIEF COMPLAINT:  B LE pain  BRIEF PATIENT DESCRIPTION: 80 yo smoker w CAD/CABG, ischemic CM / CHF, ESRD, DM, severe COPD. Was well until developed B thigh and pelvis pain after HD on 6/23.  [facility].    SIGNIFICANT EVENTS / STUDIES:  CT head 6/23 >> no acute abnormalities CT abd/pelvis 6/23 >> renal atrophy, rectal wall thickening ? Significance, extensive athlerosclerosis CXR 6/23 [facility]) >> cardiomegaly, pacer, minimal perihilar infiltrates w little change from priors TTE 6/25 >> LVH, mod-to-severe decrease LV fxn, severe TR and moderate MR, no clear-cut vegetations  LINES / TUBES: L femoral CVC 6/23 >>   CULTURES: Blood 6/23 [facility]([place]) >> GPC's >> enterococcus >>  Blood 6/24 ([facility]) >> GPC chains >>   ANTIBIOTICS: vanco 6/24 >>  Cefepime 6/24 >>   HISTORY OF PRESENT ILLNESS:  80 yo smoker w CAD/CABG, ischemic CM / CHF, ESRD, DM, severe COPD. She has been experiencing nausea, vomiting since middle of last week, was initially constipated but then had diarrhea through the weekend. Her Po intake has been decreased and she missed HD once last week. She felt well before going to HD on 6/23, but then experienced severe LE pain and cramping after she got home. This prompted her to go to the ED. She was found to be hypotensive, received ABX + IVF, moved to [facility] for further care.   SUBJECTIVE:  Complains of L upper and lower quadrant abd pain, unrelieved by Tylenol. Uses percocet for this at home. Not new pain. Said she wanted to go home" I could lie there and hurt as good as here"  VITAL SIGNS: Temp:  [97.3 F (36.3 C)-98.7 F (37.1 C)] 98.5 F (36.9 C) (06/27 0837) Pulse Rate:  [74-81] 75 (06/27 0837) Resp:  [9-18] 16 (06/27 0837) BP: (116-137)/(46-70) 124/62 mmHg (06/27  0837) SpO2:  [95 %-100 %] 96 % (06/27 0837) Weight:  [49 kg (108 lb 0.4 oz)-50.213 kg (110 lb 11.2 oz)] 50.213 kg (110 lb 11.2 oz) (06/26 2022) HEMODYNAMICS:   VENTILATOR SETTINGS:   INTAKE / OUTPUT: Intake/Output     06/26 0701 - 06/27 0700 06/27 0701 - 06/28 0700   P.O. 480 120   I.V. (mL/kg)     IV Piggyback 100    Total Intake(mL/kg) 580 (11.6) 120 (2.4)   Other 1390    Total Output 1390     Net -810 +120          PHYSICAL EXAMINATION: General:  Ill appearing elderly woman, NAD Neuro:  Awake, alert, oriented to self and place, follows commands HEENT:  Op clear, no icterus,  Cardiovascular:  Regular 70, distant, no M, paced  Lungs:  Very decreased, few basilar insp crackles Abdomen: mild diffuse tenderness, bowel sounds present, no guarding Musculoskeletal:  No lesions. Shunt access LUA Skin:  No rash  LABS:  CBC  Recent Labs Lab 11/24/13 0200 11/25/13 0434 11/26/13 0722  WBC 14.1* 8.3 7.7  HGB 7.9* 8.3* 8.6*  HCT 25.0* 26.4* 27.4*  PLT 119* 127* 130*   Coag's  Recent Labs Lab 11/24/13 0200 11/27/13 0430  INR 1.17 1.03   BMET  Recent Labs Lab 11/25/13 0434 11/26/13 0722 11/27/13 0430  NA 136* 135* 139  K 4.6 5.3 4.4  CL 99 97 99  CO2 23  22 27  BUN 38* 48* 22  CREATININE 4.20* 5.01* 3.06*  GLUCOSE 81 75 76   Electrolytes  Recent Labs Lab 11/24/13 0200 11/25/13 0434 11/26/13 0722 11/27/13 0430  CALCIUM 7.4* 8.8 10.1 10.1  MG 1.6 1.7  --  1.7  PHOS 3.8 4.8* 5.3* 3.0   Sepsis Markers  Recent Labs Lab 11/24/13 0200 11/27/13 0430  PROCALCITON 59.14 54.30   ABG No results found for this basename: PHART, PCO2ART, PO2ART,  in the last 168 hours Liver Enzymes  Recent Labs Lab 11/24/13 0200 11/26/13 0722  AST 43*  --   ALT 32  --   ALKPHOS 150*  --   BILITOT 0.3  --   ALBUMIN 1.9* 2.1*   Cardiac Enzymes  Recent Labs Lab 11/24/13 0200 11/24/13 0731 11/24/13 1315  TROPONINI <0.30 <0.30 <0.30   Glucose  Recent  Labs Lab 11/26/13 0804 11/26/13 1244 11/26/13 1552 11/26/13 1700 11/26/13 2021 11/27/13 0736  GLUCAP 88 74 92 77 105* 81    Imaging Dg Chest Port 1 View  11/25/2013   CLINICAL DATA:  Hypoxemia  EXAM: PORTABLE CHEST - 1 VIEW  COMPARISON:  11/23/2013  FINDINGS: Diffuse mild bilateral interstitial thickening. No pleural effusion or pneumothorax. Enlargement of the central pulmonary vasculature. Stable cardiomegaly. Prior CABG. Dual lead AICD. Unremarkable osseous structures.  The osseous structures are unremarkable.  IMPRESSION: Mild pulmonary venous congestion.   Electronically Signed   By: Elige KoHetal  Patel   On: 11/25/2013 18:56     ASSESSMENT / PLAN:  PULMONARY A: severe copd, without evidence AE; no current wheezing hypoxemia P:   Hold spiriva (unclear whether she was actually taking), may decide to convert back to spiriva before discharge Scheduled nebs No steroids at this time Will benefit from volume removal   CARDIOVASCULAR A: shock, hypovolemic and septic CAD HTN Chronic Ischemic CHF Pacemaker hyperlipidemia P:  Pressors now off BP regimen held, restart when stable to do so > normotensive now off meds  RENAL A:  CKD on HD P:   HD on 6/26 per renal plans.  Discussed plans with Dr Hyman HopesWebb, she will likely need extended course outpt abx at HD Follow electrolytes  GASTROINTESTINAL A:  Recent gastroenteritis Acid prophylaxis A: Left abdominal pain- Abd xr showed stool, atherosclerosis. Chronic Percocet for this. Possibly ischemic pain. BP should support return of percocet. P:   PPI Percocet  HEMATOLOGIC A:  DVT prophylaxis P:  Heparin   INFECTIOUS A:  GPC bacteremia > enterococcus. WBC coming down P:   Cefepime + vanco, dosed for renal failure. At some risk for VRE although afebrile and improving sepsis. Will not change to linezolid at this time, await cx data Will arrange for TEE   ENDOCRINE A:  DM Hypothyroidism  > TSH 6.38, improved from 2 weeks ago  but still elevated Random Cortisol 20 P:   ssi protocol Continue same synthroid dose and follow TSH for normalization  NEUROLOGIC A:  Mild encephalopathy, suspect metabolic. Head Ct [facility] P:   Supportive care  TODAY'S SUMMARY:  Treating enterococcal bacteremia, supposed source is HD.  Arranging for TEE to better eval for endocarditis. Returning home percocet for chronic abd pain.  I have personally obtained a history, examined the patient, evaluated laboratory and imaging results, formulated the assessment and plan and placed orders.   CD Young, MD  11/27/2013, 8:45 AM [facility] [phone] or if no answer [phone]

## 2013-11-27 NOTE — Progress Notes (Signed)
[facility] Progress Note  Subjective:   Abdominal pain and nausea today. Poor PO tolerance. Last BM 2 days ago.  Objective Filed Vitals:   11/26/13 2022 11/27/13 0521 11/27/13 0837 11/27/13 0857  BP: 116/68 119/63 124/62   Pulse: 76 80 75   Temp: 98.1 F (36.7 C) 98.7 F (37.1 C) 98.5 F (36.9 C)   TempSrc:   Oral   Resp: 16 17 16    Height:      Weight: 50.213 kg (110 lb 11.2 oz)     SpO2: 99% 100% 96% 99%   Physical Exam General: Alert, cooperative, NAD Heart: RRR Lungs: Diminished at based, faint crackles, no wheezes or rhonchi Abdomen: Soft, mildly tender LUQ, +BS Extremities: No LE edema Dialysis Access: large LUA AVF + bruit  Dialysis Orders: MWF @ [facility]  3:30 44.5 kg 2K/2Ca 350/A1.5 Profile 2 No Heparin AVF @ LUA  No Hectorol Aranesp 40 mcg & Venofer 50 mg on Wed  Assessment/Plan: 1. Enterococcal bacteremia - Mgmt per primary. On Cefepime & Vancomycin pending sensitivities. TEE cancelled 2. ESRD - HD on MWF, K+ 4.4. Next HD Monday  3. Hypotension/volume - SBPs 110s-120s. Net UF ~1.4L yesterday. Inpatient wgts variable. 4. Anemia - Hgb 8.6 (normally in the 10's outpatient). Aranesp 100 q Mon ordered here. Continue Weekly Fe 5. Metabolic bone disease - Ca 10.1 (11.6 corrected), P 3.0, iPTH 225 on Sensipar 60 mg qd. Hold Phoslo 5 and Vit D3  for hypercalcemia and low phos. Continue low Ca bath and follow labs. 6. Nutrition - Alb 2.1, renal carb-mod diet, vitamin. 7. DM - insulin per primary 8. Abdominal pain - On chronic percocet for atherosclerosis and possible ischemic pain per primary. Will adjust bowel regimen and add sorbitol prn for moderate constipation.    Scot JunKaren E. Thad RangerWarren, PA-C [facility] Pager [phone] 11/27/2013,11:41 AM  LOS: 3 days    Additional Objective Labs: Basic Metabolic Panel:  Recent Labs Lab 11/25/13 0434 11/26/13 0722 11/27/13 0430  NA 136* 135* 139  K 4.6 5.3 4.4  CL 99 97 99  CO2 23 22 27   GLUCOSE  81 75 76  BUN 38* 48* 22  CREATININE 4.20* 5.01* 3.06*  CALCIUM 8.8 10.1 10.1  PHOS 4.8* 5.3* 3.0   Liver Function Tests:  Recent Labs Lab 11/24/13 0200 11/26/13 0722  AST 43*  --   ALT 32  --   ALKPHOS 150*  --   BILITOT 0.3  --   PROT 4.7*  --   ALBUMIN 1.9* 2.1*   CBC:  Recent Labs Lab 11/24/13 0200 11/25/13 0434 11/26/13 0722  WBC 14.1* 8.3 7.7  HGB 7.9* 8.3* 8.6*  HCT 25.0* 26.4* 27.4*  MCV 102.0* 101.5* 103.4*  PLT 119* 127* 130*   Blood Culture    Component Value Date/Time   SDES BLOOD RIGHT THUMB 11/24/2013 0231   SPECREQUEST BOTTLES DRAWN AEROBIC ONLY 3CC 11/24/2013 0231   CULT  Value:        BLOOD CULTURE RECEIVED NO GROWTH TO DATE CULTURE WILL BE HELD FOR 5 DAYS BEFORE ISSUING A FINAL NEGATIVE REPORT Performed at [facility] 11/24/2013 0231   REPTSTATUS PENDING 11/24/2013 0231    Cardiac Enzymes:  Recent Labs Lab 11/24/13 0200 11/24/13 0731 11/24/13 1315  TROPONINI <0.30 <0.30 <0.30   CBG:  Recent Labs Lab 11/26/13 1552 11/26/13 1700 11/26/13 2021 11/27/13 0736 11/27/13 1119  GLUCAP 92 77 105* 81 79   Studies/Results: Dg Chest Port 1 View  11/25/2013  CLINICAL DATA:  Hypoxemia  EXAM: PORTABLE CHEST - 1 VIEW  COMPARISON:  11/23/2013  FINDINGS: Diffuse mild bilateral interstitial thickening. No pleural effusion or pneumothorax. Enlargement of the central pulmonary vasculature. Stable cardiomegaly. Prior CABG. Dual lead AICD. Unremarkable osseous structures.  The osseous structures are unremarkable.  IMPRESSION: Mild pulmonary venous congestion.   Electronically Signed   By: Elige KoHetal  Patel   On: 11/25/2013 18:56   Medications: . sodium chloride Stopped (11/27/13 0746)   . antiseptic oral rinse  15 mL Mouth Rinse BID  . aspirin EC  81 mg Oral Daily  . atorvastatin  80 mg Oral Daily  . ceFEPime (MAXIPIME) IV  2 g Intravenous Q M,W,F-1800  . cinacalcet  60 mg Oral Q breakfast  . [START ON 11/29/2013] darbepoetin (ARANESP) injection -  DIALYSIS  100 mcg Intravenous Q Mon-HD  . docusate sodium  200 mg Oral BID  . feeding supplement (RESOURCE BREEZE)  1 Container Oral BID BM  . folic acid  0.5 mg Oral Daily  . heparin  5,000 Units Subcutaneous 3 times per day  . insulin aspart  0-5 Units Subcutaneous QHS  . insulin aspart  0-9 Units Subcutaneous TID WC  . ipratropium-albuterol  3 mL Nebulization Q6H  . levothyroxine  150 mcg Oral QAC breakfast  . pantoprazole  40 mg Oral Daily  . vancomycin  500 mg Intravenous Q M,W,F-HD

## 2013-11-27 NOTE — Progress Notes (Signed)
Patient continues to feel nauseated. Stated the zofran was partially effective, but feels like she's still unable to take any of her other PO meds at this time. Will monitor.  Leanna BattlesEckelmann, Cybill Eileen, RN.

## 2013-11-27 NOTE — Progress Notes (Signed)
ANTIBIOTIC CONSULT NOTE - INITIAL  Pharmacy Consult for Vancomycin/Gentamicin Indication: Bacteremia/endocarditis  Allergies  Allergen Reactions  . Ivp Dye [Iodinated Diagnostic Agents] Swelling  . Lisinopril Swelling  . Iohexol Itching and Swelling        Patient Measurements: ~44 kg  Vital Signs: Temp: 98.5 F (36.9 C) (06/27 0837) Temp src: Oral (06/27 0837) BP: 124/62 mmHg (06/27 0837) Pulse Rate: 75 (06/27 0837)  L[facility]  WBC 13.8 Scr 3.5 (HD patient)  Medical History: Past Medical History  Diagnosis Date  . Carotid artery occlusion     Bilateral carotid artery disease  . Peripheral artery disease   . Arrhythmia     ? hx of atrial fibrillation  . CHF (congestive heart failure)     EF 25-30%.   . Hypertension   . Hyperlipidemia   . ESRD on hemodialysis 06/20/11    M, W, F; [address], [place].  Started HD in Sept 2008  . AAA (abdominal aortic aneurysm)   . Stroke ` 2007  . Duodenal ulcer 2008  . S/P CABG (coronary artery bypass graft)     CABG in 2008, had 3 MI's prior  . Depression   . Pneumonia   . COPD, severe   . Hypothyroidism   . Anemia   . Pacemaker     Assessment: 80 y/o F [facility] with [facility], starting antibiotics for r/o sepsis, now with [facility] blood cultures growing vancomycin sensitive enterococcus. Patient is now on day #4 IV abx, will continue vancomycin, discontinue cefepime, and add synergistic gentamicin for presumed endocarditis since patient at risk with AICD.  Patient is currently afeb, WBC wnl on MWF HD. Last HD on 6/26 x 2-3 hrs.   Vanc 6/24 >> Cefepime 6/24 >>6/27 Synergistic gen 6/27>>  6/24 BC >>1/2 enterococcus [place] cultures - Blood >> vanc sens enterococcus   Goal of Therapy:  Pre-HD vancomycin level 15-25 mg/L Pre-HD gentamicin level 3-4 mg/L  Plan:  - Discontinue cefepime  - Vancomycin  500 mg qHD  - Will consider pre-HD VR on Monday since did not tolerate full HD  session on 6/26 - Start Gentamicin 80 mg IV x 1 (Vd x Cp), then continue with 40 mg IV (slightly less than 1 mg/kg) qHD  -Trend WBC, temp, HD tolerance, pre-HD levels prn   Margie BilletErika K. von Vajna, PharmD Clinical Pharmacist - Resident Pager: [phone] Pharmacy: [phone] 11/27/2013 2:46 PM

## 2013-11-28 DIAGNOSIS — I079 Rheumatic tricuspid valve disease, unspecified: Secondary | ICD-10-CM

## 2013-11-28 DIAGNOSIS — E43 Unspecified severe protein-calorie malnutrition: Secondary | ICD-10-CM

## 2013-11-28 DIAGNOSIS — B999 Unspecified infectious disease: Secondary | ICD-10-CM

## 2013-11-28 DIAGNOSIS — I330 Acute and subacute infective endocarditis: Secondary | ICD-10-CM

## 2013-11-28 DIAGNOSIS — I071 Rheumatic tricuspid insufficiency: Secondary | ICD-10-CM

## 2013-11-28 LAB — PROCALCITONIN: PROCALCITONIN: 41.35 ng/mL

## 2013-11-28 LAB — GLUCOSE, CAPILLARY
GLUCOSE-CAPILLARY: 84 mg/dL (ref 70–99)
Glucose-Capillary: 71 mg/dL (ref 70–99)
Glucose-Capillary: 98 mg/dL (ref 70–99)
Glucose-Capillary: 88 mg/dL (ref 70–99)

## 2013-11-28 LAB — CULTURE, BLOOD (ROUTINE X 2)

## 2013-11-28 NOTE — Progress Notes (Signed)
Patient ID: Maria LefevreVera M Fiorentino, female   DOB: 09-09-1932, 80 y.o.   MRN: [id number]         [facility]    Date of Admission:  11/24/2013           Day 6 vancomycin        Day 2 gentamicin  Principal Problem:   Bacteremia due to Enterococcus Active Problems:   AICD (automatic cardioverter/defibrillator) present   Coronary artery disease   COPD (chronic obstructive pulmonary disease)   ESRD (end stage renal disease) on dialysis   Unspecified hypothyroidism   Shock   Abdominal pain, left upper quadrant   Tricuspid valve regurgitation due to infection   Bacterial endocarditis   Protein-calorie malnutrition, severe   . antiseptic oral rinse  15 mL Mouth Rinse BID  . aspirin EC  81 mg Oral Daily  . atorvastatin  80 mg Oral Daily  . cinacalcet  60 mg Oral Q breakfast  . [START ON 11/29/2013] darbepoetin (ARANESP) injection - DIALYSIS  100 mcg Intravenous Q Mon-HD  . docusate sodium  200 mg Oral BID  . feeding supplement (RESOURCE BREEZE)  1 Container Oral BID BM  . folic acid  0.5 mg Oral Daily  . [START ON 11/29/2013] gentamicin  40 mg Intravenous Q M,W,F-HD  . heparin  5,000 Units Subcutaneous 3 times per day  . insulin aspart  0-5 Units Subcutaneous QHS  . insulin aspart  0-9 Units Subcutaneous TID WC  . ipratropium-albuterol  3 mL Nebulization Q6H  . levothyroxine  150 mcg Oral QAC breakfast  . pantoprazole  40 mg Oral Daily  . vancomycin  500 mg Intravenous Q M,W,F-HD    Subjective: She is visiting with family. She is less upset about being in the hospital.  Objective: Temp:  [97.8 F (36.6 C)-98.6 F (37 C)] 98.4 F (36.9 C) (06/28 0918) Pulse Rate:  [75-80] 80 (06/28 0918) Resp:  [16-17] 16 (06/28 0918) BP: (112-122)/(61-64) 115/61 mmHg (06/28 0918) SpO2:  [99 %-100 %] 100 % (06/28 0918) Weight:  [50.848 kg (112 lb 1.6 oz)] 50.848 kg (112 lb 1.6 oz) (06/27 2132)  General: She alert and calm Skin: Scattered ecchymoses on her arms and legs.  No splinter or conjunctival hemorrhages noted  Lungs: Clear  Cor: Regular S1 and S2. 2/6 systolic murmur heard at left lower sternal border  Chest wall: No abnormality noted around right anterior chest AICD  Abdomen: Soft and nontender  Joints and extremities: No acute abnormalities noted   Lab Results Lab Results  Component Value Date   WBC 7.7 11/26/2013   HGB 8.6* 11/26/2013   HCT 27.4* 11/26/2013   MCV 103.4* 11/26/2013   PLT 130* 11/26/2013    Lab Results  Component Value Date   CREATININE 3.06* 11/27/2013   BUN 22 11/27/2013   NA 139 11/27/2013   K 4.4 11/27/2013   CL 99 11/27/2013   CO2 27 11/27/2013    Lab Results  Component Value Date   ALT 32 11/24/2013   AST 43* 11/24/2013   ALKPHOS 150* 11/24/2013   BILITOT 0.3 11/24/2013      Microbiology: Recent Results (from the past 240 hour(s))  MRSA PCR SCREENING     Status: None   Collection Time    11/24/13 12:53 AM      Result Value Ref Range Status   MRSA by PCR NEGATIVE  NEGATIVE Final   Comment:  The GeneXpert MRSA Assay (FDA     approved for NASAL specimens     only), is one component of a     comprehensive MRSA colonization     surveillance program. It is not     intended to diagnose MRSA     infection nor to guide or     monitor treatment for     MRSA infections.  CULTURE, BLOOD (ROUTINE X 2)     Status: None   Collection Time    11/24/13  2:00 AM      Result Value Ref Range Status   Specimen Description BLOOD RIGHT HAND   Final   Special Requests BOTTLES DRAWN AEROBIC ONLY 1CC   Final   Culture  Setup Time     Final   Value: 11/24/2013 08:19     Performed at [facility]   Culture     Final   Value: ENTEROCOCCUS SPECIES     Note: COMBINATION THERAPY OF HIGH DOSE AMPICILLIN OR VANCOMYCIN, PLUS AN AMINOGLYCOSIDE, IS USUALLY INDICATED FOR SERIOUS ENTEROCOCCAL INFECTIONS.     Note: Gram Stain Report Called to,Read Back By and Verified With: SARA GROCE 11/26/13 0900 BY SMITHERSJ     Performed at  [facility]   Report Status 11/28/2013 FINAL   Final   Organism ID, Bacteria ENTEROCOCCUS SPECIES   Final  CULTURE, BLOOD (ROUTINE X 2)     Status: None   Collection Time    11/24/13  2:31 AM      Result Value Ref Range Status   Specimen Description BLOOD RIGHT THUMB   Final   Special Requests BOTTLES DRAWN AEROBIC ONLY 3CC   Final   Culture  Setup Time     Final   Value: 11/24/2013 08:20     Performed at [facility]   Culture     Final   Value:        BLOOD CULTURE RECEIVED NO GROWTH TO DATE CULTURE WILL BE HELD FOR 5 DAYS BEFORE ISSUING A FINAL NEGATIVE REPORT     Performed at [facility]   Report Status PENDING   Incomplete    Studies/Results: No results found.  Assessment: I will continue vancomycin and gentamicin after hemodialysis for enterococcal bacteremia and presumed right-sided endocarditis involving her AICD.  Plan: 1. Continue vancomycin and gentamicin  Cliffton AstersJohn Terianne Thaker, MD [facility] [phone] pager   [phone] cell 11/28/2013, 2:04 PM

## 2013-11-28 NOTE — Progress Notes (Signed)
PULMONARY / CRITICAL CARE MEDICINE   Name: Maria LefevreVera M [name] MRN: [id number] DOB: 03-May-1933    ADMISSION DATE:  11/24/2013  REFERRING MD :  [facility] PRIMARY SERVICE: PCCM  CHIEF COMPLAINT:  B LE pain  BRIEF PATIENT DESCRIPTION: 80 yo smoker w CAD/CABG, ischemic CM / CHF, ESRD, DM, severe COPD. Was well until developed B thigh and pelvis pain after HD on 6/23.  Transferred from [facility] ED with hypotension.    SIGNIFICANT EVENTS / STUDIES:  CT head 6/23 >> no acute abnormalities CT abd/pelvis 6/23 >> renal atrophy, rectal wall thickening ? Significance, extensive athlerosclerosis CXR 6/23 [facility]([place]) >> cardiomegaly, pacer, minimal perihilar infiltrates w little change from priors TTE 6/25 >> LVH, mod-to-severe decrease LV fxn, severe TR and moderate MR, no clear-cut vegetations  LINES / TUBES: L femoral CVC 6/23 >>   CULTURES: Blood 6/23 [facility]([place]) >> GPC's >> enterococcus >>  Blood 6/24 ([facility]) >> GPC chains >>   ANTIBIOTICS: vanco 6/24 >>  Cefepime 6/24 >> 6/27 Gent 6/29>>  HISTORY OF PRESENT ILLNESS:  80 yo smoker w CAD/CABG, ischemic CM / CHF, ESRD, DM, severe COPD. She has been experiencing nausea, vomiting since middle of last week, was initially constipated but then had diarrhea through the weekend. Her Po intake has been decreased and she missed HD once last week. She felt well before going to HD on 6/23, but then experienced severe LE pain and cramping after she got home. This prompted her to go to the ED. She was found to be hypotensive, received ABX + IVF, [facility]   SUBJECTIVE:  Pain eased with percocet and interpolated NSAID. Nurse reported serous ooze from venipuncture sites R volar forearm- now apparently stopped.   VITAL SIGNS: Temp:  [97.8 F (36.6 C)-98.6 F (37 C)] 97.8 F (36.6 C) (06/28 0433) Pulse Rate:  [75-79] 79 (06/28 0433) Resp:  [16-17] 16 (06/28 0433) BP: (112-122)/(63-64) 115/63 mmHg (06/28 0433) SpO2:  [99 %-100 %] 100 %  (06/28 0759) Weight:  [50.848 kg (112 lb 1.6 oz)] 50.848 kg (112 lb 1.6 oz) (06/27 2132) HEMODYNAMICS:   VENTILATOR SETTINGS:   INTAKE / OUTPUT: Intake/Output     06/27 0701 - 06/28 0700 06/28 0701 - 06/29 0700   P.O. 480 100   IV Piggyback     Total Intake(mL/kg) 480 (9.4) 100 (2)   Other     Total Output       Net +480 +100          PHYSICAL EXAMINATION: General:  Ill appearing elderly woman, NAD, better mood Neuro:  Awake, alert, oriented to self and place, follows commands HEENT:  Op clear, no icterus,  Cardiovascular:  Regular 70, distant, 2-3 syst M  LUSB-TR, paced  Lungs:  Very decreased, few basilar insp crackles Abdomen: mild diffuse tenderness, bowel sounds present, no guarding Musculoskeletal:  No lesions. Shunt access LUA. Poor tissue turgor, muscle wasting Skin:  No rash  LABS:  CBC  Recent Labs Lab 11/24/13 0200 11/25/13 0434 11/26/13 0722  WBC 14.1* 8.3 7.7  HGB 7.9* 8.3* 8.6*  HCT 25.0* 26.4* 27.4*  PLT 119* 127* 130*   Coag's  Recent Labs Lab 11/24/13 0200 11/27/13 0430  INR 1.17 1.03   BMET  Recent Labs Lab 11/25/13 0434 11/26/13 0722 11/27/13 0430  NA 136* 135* 139  K 4.6 5.3 4.4  CL 99 97 99  CO2 23 22 27   BUN 38* 48* 22  CREATININE 4.20* 5.01* 3.06*  GLUCOSE  81 75 76   Electrolytes  Recent Labs Lab 11/24/13 0200 11/25/13 0434 11/26/13 0722 11/27/13 0430  CALCIUM 7.4* 8.8 10.1 10.1  MG 1.6 1.7  --  1.7  PHOS 3.8 4.8* 5.3* 3.0   Sepsis Markers  Recent Labs Lab 11/24/13 0200 11/27/13 0430 11/28/13 0530  PROCALCITON 59.14 54.30 41.35   ABG No results found for this basename: PHART, PCO2ART, PO2ART,  in the last 168 hours Liver Enzymes  Recent Labs Lab 11/24/13 0200 11/26/13 0722  AST 43*  --   ALT 32  --   ALKPHOS 150*  --   BILITOT 0.3  --   ALBUMIN 1.9* 2.1*   Cardiac Enzymes  Recent Labs Lab 11/24/13 0200 11/24/13 0731 11/24/13 1315  TROPONINI <0.30 <0.30 <0.30   Glucose  Recent  Labs Lab 11/26/13 2021 11/27/13 0736 11/27/13 1119 11/27/13 1620 11/27/13 2131 11/28/13 0739  GLUCAP 105* 81 79 104* 93 71    Imaging No results found.   ASSESSMENT / PLAN:  PULMONARY A: severe copd, without evidence AE; no current wheezing hypoxemia P:   Hold spiriva (unclear whether she was actually taking), may decide to convert back to spiriva before discharge Scheduled nebs No steroids at this time    CARDIOVASCULAR A: shock, hypovolemic and septic CAD HTN Chronic Ischemic CHF Pacemaker Hyperlipidemia Systolic murmur c/w tricuspid regurgitation 2-3/6 - severe by Echo Poor venous access, with Shunt L arm. Want to avoid more iv foreign bodies, but may need cvl for access P:  Pressors now off BP regimen held, restart when stable to do so > normotensive now off meds  RENAL A:  CKD on HD P:   HD on 6/26 per renal plans.  Discussed plans with Dr Hyman HopesWebb, she will likely need extended course outpt abx at HD Follow electrolytes  GASTROINTESTINAL A:  Recent gastroenteritis Acid prophylaxis A: Left abdominal pain- Abd xr showed stool, atherosclerosis. Chronic Percocet for this. Possibly ischemic pain. BP should support return of percocet. P:   PPI Percocet  HEMATOLOGIC A:  DVT prophylaxis P:  Heparin   INFECTIOUS A:  GPC bacteremia > enterococcus. ID seeing for enterococcal bacteremia with TR c/w R sided bacterial endocarditis, likely involving AICD P:   Gent+ vanco, dosed for renal failure. At some risk for VRE although afebrile and improving sepsis.    ENDOCRINE A:  DM Hypothyroidism  > TSH 6.38, improved from 2 weeks ago but still elevated Random Cortisol 20 P:   ssi protocol Continue same synthroid dose and follow TSH for normalization  NEUROLOGIC A:  Mild encephalopathy, suspect metabolic. [facility] P:   Supportive care  PROTEIN CALORIE MALNUTRITION- poor po intake, cachectic with poor skin and muscle turgor P-  dietician   TODAY'S SUMMARY:  Treating enterococcal bacteremia, supposed source is HD.  ID support asppreciated. Severe TR c/w bacterial endocarditis.  Resumed home percocet for chronic abd pain. Trying to limit sticks, but likely to need better venous access.  I have personally obtained a history, examined the patient, evaluated laboratory and imaging results, formulated the assessment and plan and placed orders.   CD Maree Ainley, MD  11/28/2013, 8:50 AM [facility] [phone] or if no answer [phone]

## 2013-11-28 NOTE — Progress Notes (Signed)
[facility] Progress Note  Subjective:   Weeping drainage to RUE. Wants a bath.   Objective Filed Vitals:   11/27/13 2132 11/28/13 0433 11/28/13 0759 11/28/13 0918  BP: 112/63 115/63  115/61  Pulse: 78 79  80  Temp: 97.9 F (36.6 C) 97.8 F (36.6 C)  98.4 F (36.9 C)  TempSrc: Oral Oral  Oral  Resp: 17 16  16   Height:      Weight: 50.848 kg (112 lb 1.6 oz)     SpO2: 100% 100% 100% 100%   Physical Exam General: Alert, cooperative, NAD Heart: RRR Lungs: Diminished at bases, no wheezes or rhonchi Abdomen: Soft, NT, +BS Extremities: no LE edema Dialysis Access: Large L AVF + bruit  Dialysis Orders: MWF @ [facility]  3:30 44.5 kg 2K/2Ca 350/A1.5 Profile 2 No Heparin AVF @ LUA  No Hectorol Aranesp 40 mcg & Venofer 50 mg on Wed   Assessment/Plan: 1. Enterococcal bacteremia - Mgmt per primary/ID. Echo with TR and suspected rt sided endocarditis involving  AICD leads. Now on vanc and gent. Afebrile. 2. ESRD - HD on MWF, K+ 4.4. Next HD tomorrow 3. Hypotension/volume - SBPs 110s. Net UF ~1.4L on 6/26. Inpatient wgts variable. UF as tolerated 4. Anemia - Hgb 8.6 (normally in the 10's outpatient). Aranesp 100 q Mon ordered here. Continue Weekly Fe 5. Metabolic bone disease - Ca 10.1 (11.6 corrected), P 3.0, iPTH 225 on Sensipar 60 mg qd. Hold Phoslo 5 and Vit D3 for hypercalcemia and low phos. Continue low Ca bath and follow labs. 6. Nutrition - Alb 2.1, renal carb-mod diet, vitamin. 7. DM - insulin per primary 8. Abdominal pain - On chronic percocet for atherosclerosis and possible ischemic pain per primary. Will adjust bowel regimen and add sorbitol prn for moderate constipation.    Scot JunKaren E. Thad RangerWarren, PA-C [facility] Pager [phone] 11/28/2013,11:02 AM  LOS: 4 days    Additional Objective Labs: Basic Metabolic Panel:  Recent Labs Lab 11/25/13 0434 11/26/13 0722 11/27/13 0430  NA 136* 135* 139  K 4.6 5.3 4.4  CL 99 97 99  CO2 23 22 27   GLUCOSE  81 75 76  BUN 38* 48* 22  CREATININE 4.20* 5.01* 3.06*  CALCIUM 8.8 10.1 10.1  PHOS 4.8* 5.3* 3.0   Liver Function Tests:  Recent Labs Lab 11/24/13 0200 11/26/13 0722  AST 43*  --   ALT 32  --   ALKPHOS 150*  --   BILITOT 0.3  --   PROT 4.7*  --   ALBUMIN 1.9* 2.1*   CBC:  Recent Labs Lab 11/24/13 0200 11/25/13 0434 11/26/13 0722  WBC 14.1* 8.3 7.7  HGB 7.9* 8.3* 8.6*  HCT 25.0* 26.4* 27.4*  MCV 102.0* 101.5* 103.4*  PLT 119* 127* 130*   Blood Culture    Component Value Date/Time   SDES BLOOD RIGHT THUMB 11/24/2013 0231   SPECREQUEST BOTTLES DRAWN AEROBIC ONLY 3CC 11/24/2013 0231   CULT  Value:        BLOOD CULTURE RECEIVED NO GROWTH TO DATE CULTURE WILL BE HELD FOR 5 DAYS BEFORE ISSUING A FINAL NEGATIVE REPORT Performed at [facility] 11/24/2013 0231   REPTSTATUS PENDING 11/24/2013 0231    Cardiac Enzymes:  Recent Labs Lab 11/24/13 0200 11/24/13 0731 11/24/13 1315  TROPONINI <0.30 <0.30 <0.30   CBG:  Recent Labs Lab 11/27/13 0736 11/27/13 1119 11/27/13 1620 11/27/13 2131 11/28/13 0739  GLUCAP 81 79 104* 93 71   Studies/Results: No results found. Medications: .  sodium chloride Stopped (11/27/13 0746)   . antiseptic oral rinse  15 mL Mouth Rinse BID  . aspirin EC  81 mg Oral Daily  . atorvastatin  80 mg Oral Daily  . cinacalcet  60 mg Oral Q breakfast  . [START ON 11/29/2013] darbepoetin (ARANESP) injection - DIALYSIS  100 mcg Intravenous Q Mon-HD  . docusate sodium  200 mg Oral BID  . feeding supplement (RESOURCE BREEZE)  1 Container Oral BID BM  . folic acid  0.5 mg Oral Daily  . [START ON 11/29/2013] gentamicin  40 mg Intravenous Q M,W,F-HD  . heparin  5,000 Units Subcutaneous 3 times per day  . insulin aspart  0-5 Units Subcutaneous QHS  . insulin aspart  0-9 Units Subcutaneous TID WC  . ipratropium-albuterol  3 mL Nebulization Q6H  . levothyroxine  150 mcg Oral QAC breakfast  . pantoprazole  40 mg Oral Daily  . vancomycin  500 mg  Intravenous Q M,W,F-HD

## 2013-11-28 NOTE — Plan of Care (Signed)
Problem: Phase III Progression Outcomes Goal: Voiding independently Outcome: Not Applicable Date Met:  07/46/00 Patient is anuric.

## 2013-11-28 NOTE — Progress Notes (Addendum)
Spoke with MD on call concerning right arm weeping clear fluid since day shift. Right arm elevated on pillow.  Weeping had soaked through abd pad and gauze within 2 hours.Per MD, keep elevated and apply dressing.

## 2013-11-28 NOTE — Progress Notes (Signed)
Patient's RAC area began draining serous fluid again. Gauze and ABD applied and wrapped with kerlix dressing. Arm elevated on pillow. Will monitor.  Leanna BattlesEckelmann, Cybill Eileen, RN.

## 2013-11-28 NOTE — Progress Notes (Signed)
I have seen and examined this patient and agree with the plan of care. Treatment empiric awaiting sensitivities. Recommend 6 weeks  WEBB,MARTIN W 11/28/2013, 11:24 AM

## 2013-11-28 NOTE — Progress Notes (Signed)
Dr. Maple HudsonYoung evaluated patient's RUE swelling/draining. Dressing was removed and it appears to have stopped draining at this time. Instructed to keep dressing off, keep arm elevated, and continue to monitor for increasing swelling. Patient instructed to inform RN if draining is noted. Will monitor.  Leanna BattlesEckelmann, Cybill Eileen, RN.

## 2013-11-29 DIAGNOSIS — J189 Pneumonia, unspecified organism: Secondary | ICD-10-CM

## 2013-11-29 DIAGNOSIS — I330 Acute and subacute infective endocarditis: Secondary | ICD-10-CM

## 2013-11-29 DIAGNOSIS — J438 Other emphysema: Secondary | ICD-10-CM

## 2013-11-29 LAB — RENAL FUNCTION PANEL
Albumin: 2.1 g/dL — ABNORMAL LOW (ref 3.5–5.2)
BUN: 38 mg/dL — ABNORMAL HIGH (ref 6–23)
CO2: 26 mEq/L (ref 19–32)
CREATININE: 5.07 mg/dL — AB (ref 0.50–1.10)
Calcium: 9.5 mg/dL (ref 8.4–10.5)
Chloride: 95 mEq/L — ABNORMAL LOW (ref 96–112)
GFR calc Af Amer: 8 mL/min — ABNORMAL LOW (ref >90)
GFR calc non Af Amer: 7 mL/min — ABNORMAL LOW (ref >90)
Glucose, Bld: 83 mg/dL (ref 70–99)
Phosphorus: 4.4 mg/dL (ref 2.3–4.6)
Potassium: 5.3 mEq/L (ref 3.7–5.3)
Sodium: 136 mEq/L — ABNORMAL LOW (ref 137–147)

## 2013-11-29 LAB — CBC
HCT: 28.5 % — ABNORMAL LOW (ref 36.0–46.0)
HEMOGLOBIN: 8.9 g/dL — AB (ref 12.0–15.0)
MCH: 32.6 pg (ref 26.0–34.0)
MCHC: 31.2 g/dL (ref 30.0–36.0)
MCV: 104.4 fL — AB (ref 78.0–100.0)
Platelets: 174 10*3/uL (ref 150–400)
RBC: 2.73 MIL/uL — AB (ref 3.87–5.11)
RDW: 16.8 % — ABNORMAL HIGH (ref 11.5–15.5)
WBC: 5.4 10*3/uL (ref 4.0–10.5)

## 2013-11-29 LAB — GLUCOSE, CAPILLARY
Glucose-Capillary: 69 mg/dL — ABNORMAL LOW (ref 70–99)
Glucose-Capillary: 94 mg/dL (ref 70–99)
Glucose-Capillary: 100 mg/dL — ABNORMAL HIGH (ref 70–99)
Glucose-Capillary: 126 mg/dL — ABNORMAL HIGH (ref 70–99)

## 2013-11-29 MED ORDER — DARBEPOETIN ALFA-POLYSORBATE 100 MCG/0.5ML IJ SOLN
INTRAMUSCULAR | Status: AC
  Filled 2013-11-29: qty 0.5

## 2013-11-29 MED ORDER — GENTAMICIN IN SALINE 1.2-0.9 MG/ML-% IV SOLN
60.0000 mg | INTRAVENOUS | Status: DC
  Administered 2013-12-01 – 2013-12-03 (×2): 60 mg via INTRAVENOUS
  Filled 2013-11-29 (×3): qty 50

## 2013-11-29 MED ORDER — DIPHENHYDRAMINE HCL 12.5 MG/5ML PO ELIX
12.5000 mg | ORAL_SOLUTION | Freq: Once | ORAL | Status: AC
  Administered 2013-11-30: 12.5 mg via ORAL
  Filled 2013-11-29: qty 5

## 2013-11-29 NOTE — Progress Notes (Signed)
ANTIBIOTIC CONSULT NOTE - FOLLOW UP  Pharmacy Consult for vancomycin and gentamicin Indication: Bacteremia/endocarditis   Allergies  Allergen Reactions  . Ivp Dye [Iodinated Diagnostic Agents] Swelling  . Lisinopril Swelling  . Iohexol Itching and Swelling         Patient Measurements: Height: 5\' 3"  (160 cm) Weight: 111 lb 5.3 oz (50.5 kg) IBW/kg (Calculated) : 52.4  Vital Signs: Temp: 97.9 F (36.6 C) (06/29 1242) Temp src: Oral (06/29 1242) BP: 120/46 mmHg (06/29 1242) Pulse Rate: 78 (06/29 1242) Intake/Output from previous day: 06/28 0701 - 06/29 0700 In: 320 [P.O.:320] Out: -  Intake/Output from this shift: Total I/O In: -  Out: 1900 [Other:1900]  Labs:  Recent Labs  11/27/13 0430 11/29/13 0720  WBC  --  5.4  HGB  --  8.9*  PLT  --  174  CREATININE 3.06* 5.07*   Estimated Creatinine Clearance: 7.2 ml/min (by C-G formula based on Cr of 5.07). No results found for this basename: VANCOTROUGH, Leodis Binet, VANCORANDOM, GENTTROUGH, GENTPEAK, GENTRANDOM, TOBRATROUGH, TOBRAPEAK, TOBRARND, AMIKACINPEAK, AMIKACINTROU, AMIKACIN,  in the last 72 hours   Microbiology: Recent Results (from the past 720 hour(s))  MRSA PCR SCREENING     Status: None   Collection Time    11/24/13 12:53 AM      Result Value Ref Range Status   MRSA by PCR NEGATIVE  NEGATIVE Final   Comment:            The GeneXpert MRSA Assay (FDA     approved for NASAL specimens     only), is one component of a     comprehensive MRSA colonization     surveillance program. It is not     intended to diagnose MRSA     infection nor to guide or     monitor treatment for     MRSA infections.  CULTURE, BLOOD (ROUTINE X 2)     Status: None   Collection Time    11/24/13  2:00 AM      Result Value Ref Range Status   Specimen Description BLOOD RIGHT HAND   Final   Special Requests BOTTLES DRAWN AEROBIC ONLY 1CC   Final   Culture  Setup Time     Final   Value: 11/24/2013 08:19     Performed at [facility]   Culture     Final   Value: ENTEROCOCCUS SPECIES     Note: COMBINATION THERAPY OF HIGH DOSE AMPICILLIN OR VANCOMYCIN, PLUS AN AMINOGLYCOSIDE, IS USUALLY INDICATED FOR SERIOUS ENTEROCOCCAL INFECTIONS.     Note: Gram Stain Report Called to,Read Back By and Verified With: SARA GROCE 11/26/13 0900 BY SMITHERSJ     Performed at [facility]   Report Status 11/28/2013 FINAL   Final   Organism ID, Bacteria ENTEROCOCCUS SPECIES   Final  CULTURE, BLOOD (ROUTINE X 2)     Status: None   Collection Time    11/24/13  2:31 AM      Result Value Ref Range Status   Specimen Description BLOOD RIGHT THUMB   Final   Special Requests BOTTLES DRAWN AEROBIC ONLY 3CC   Final   Culture  Setup Time     Final   Value: 11/24/2013 08:20     Performed at [facility]   Culture     Final   Value:        BLOOD CULTURE RECEIVED NO GROWTH TO DATE CULTURE WILL BE HELD FOR 5 DAYS BEFORE ISSUING  A FINAL NEGATIVE REPORT     [facility]formed at [facility]   Report Status PENDING   Incomplete  CULTURE, BLOOD (ROUTINE X 2)     Status: None   Collection Time    11/27/13  4:30 PM      Result Value Ref Range Status   Specimen Description BLOOD RIGHT HAND   Final   Special Requests BOTTLES DRAWN AEROBIC ONLY 10 CC   Final   Culture  Setup Time     Final   Value: 11/28/2013 02:23     Performed at [facility]   Culture     Final   Value:        BLOOD CULTURE RECEIVED NO GROWTH TO DATE CULTURE WILL BE HELD FOR 5 DAYS BEFORE ISSUING A FINAL NEGATIVE REPORT     Performed at [facility]   Report Status PENDING   Incomplete  CULTURE, BLOOD (ROUTINE X 2)     Status: None   Collection Time    11/27/13  4:30 PM      Result Value Ref Range Status   Specimen Description BLOOD RIGHT ARM   Final   Special Requests BOTTLES DRAWN AEROBIC ONLY 9.5 CC   Final   Culture  Setup Time     Final   Value: 11/28/2013 02:23     Performed at [facility]   Culture     Final   Value:         BLOOD CULTURE RECEIVED NO GROWTH TO DATE CULTURE WILL BE HELD FOR 5 DAYS BEFORE ISSUING A FINAL NEGATIVE REPORT     Performed at [facility]   Report Status PENDING   Incomplete    Anti-infectives   Start     Dose/Rate Route Frequency Ordered Stop   11/29/13 1200  gentamicin (GARAMYCIN) 40 mg in dextrose 5 % 50 mL IVPB     40 mg 102 mL/hr over 30 Minutes Intravenous Every M-W-F (Hemodialysis) 11/27/13 1453     11/27/13 1600  gentamicin (GARAMYCIN) IVPB 80 mg     80 mg 100 mL/hr over 30 Minutes Intravenous  Once 11/27/13 1452 11/27/13 1617   11/26/13 1800  ceFEPIme (MAXIPIME) 2 g in dextrose 5 % 50 mL IVPB  Status:  Discontinued     2 g 100 mL/hr over 30 Minutes Intravenous Every M-W-F (1800) 11/25/13 1154 11/27/13 1400   11/26/13 1200  vancomycin (VANCOCIN) 500 mg in sodium chloride 0.9 % 100 mL IVPB     500 mg 100 mL/hr over 60 Minutes Intravenous Every M-W-F (Hemodialysis) 11/25/13 1154     11/24/13 0230  vancomycin (VANCOCIN) IVPB 1000 mg/200 mL premix     1,000 mg 200 mL/hr over 60 Minutes Intravenous  Once 11/24/13 0222 11/24/13 0419   11/24/13 0230  ceFEPIme (MAXIPIME) 2 g in dextrose 5 % 50 mL IVPB     2 g 100 mL/hr over 30 Minutes Intravenous  Once 11/24/13 0222 11/24/13 0349      Assessment: Patient is a 80 y.o F with ESRD on HD currently on vancomycin and gentamicin for enterococcus bacteremia and suspected endocarditis.  Patient has received 2 HD sessions since admission with vancomycin and gentamycin doses given this morning.  Vanc 6/24 >> Cefepime 6/24 >>6/27 Synergistic gen 6/27>>  6/27 BCx2>>ngtd 6/24 BC >>1/2 enterococcus (S vanc) [place] cultures - Blood >> vanc sens enterococcus  Goal of Therapy:  Pre-HD vancomycin level 15-25 Pre-HD gentamicin level ~4  Plan:  1)  cont vanc 500mg  qHD-- need to check pre-HD level on wed 2) change gent to 60mg  IV QHD (~80% of LD)-- next dose due at next HD session   Pham, Anh P 11/29/2013,12:45 PM

## 2013-11-29 NOTE — Progress Notes (Signed)
Subjective:   Seen on dialysis, mid-back pain, wants to go home  Objective: Vital signs in last 24 hours: Temp:  [98 F (36.7 C)-98.6 F (37 C)] 98.4 F (36.9 C) (06/29 0709) Pulse Rate:  [73-87] 77 (06/29 0800) Resp:  [15-20] 15 (06/29 0709) BP: (115-149)/(52-67) 120/63 mmHg (06/29 0800) SpO2:  [96 %-100 %] 100 % (06/29 0830) Weight:  [51.6 kg (113 lb 12.1 oz)-52.5 kg (115 lb 11.9 oz)] 52.5 kg (115 lb 11.9 oz) (06/29 0709) Weight change: 0.752 kg (1 lb 10.5 oz)  Intake/Output from previous day: 06/28 0701 - 06/29 0700 In: 320 [P.O.:320] Out: -    Lab Results:  Recent Labs  11/29/13 0720  WBC 5.4  HGB 8.9*  HCT 28.5*  PLT 174   BMET:  Recent Labs  11/27/13 0430 11/29/13 0720  NA 139 136*  K 4.4 5.3  CL 99 95*  CO2 27 26  GLUCOSE 76 83  BUN 22 38*  CREATININE 3.06* 5.07*  CALCIUM 10.1 9.5  ALBUMIN  --  2.1*   No results found for this basename: PTH,  in the last 72 hours Iron Studies: No results found for this basename: IRON, TIBC, TRANSFERRIN, FERRITIN,  in the last 72 hours  Studies/Results: No results found.  EXAM: General appearance:  Alert, in no apparent distress Resp:  Decreased breath sounds, but clear Cardio:  RRR without murmur or rub GI: + BS, soft and nontender Extremities:  No edema Access: AVF @ LUA with BFR 350  Dialysis Orders: MWF @ [facility]  3:30 44.5 kg 2K/2Ca 350/A1.5 Profile 2 No Heparin AVF @ LUA  No Hectorol Aranesp 40 mcg & Venofer 50 mg on Wed  Assessment/Plan: 1. Enterococcal bacteremia - per BCs, echo 6/25 with TR and presumed right-side endocarditis involving AICD; Vancomycin & Gentamicin per ID. 2. ESRD - HD on MWF @ [facility], K 5.3.  HD today. 3. Hypotension/volume - 122/49, no meds; EDW was lowered from 47.5 kg to 44.5 kg after last admission for pulmonary edema, wts variable this admission, UF as tolerated. 4. Anemia - Hgb 8.9, Aranesp 100 mcg today, Venofer on Wed.  5. Metabolic bone disease - Ca 9.5 (11 corrected), P 4.4, iPTH  225; Vitamin D3 1000 U qd, sesnipar 60 mg qd, Phoslo 5 with meals.  6. Nutrition - Alb 2.1, renal carb-mod diet, vitamin.  7. DM - insulin per primary.   LOS: 5 days   [name],[name] 11/29/2013,8:59 AM  Pt seen, examined and agree w A/P as above.  Maria Moselleob Tamaj Jurgens MD pager [phone]    cell [phone] 11/29/2013, 2:08 PM

## 2013-11-29 NOTE — Progress Notes (Signed)
PULMONARY / CRITICAL CARE MEDICINE   Name: [name] MRN: [id number] DOB: 05/16/1933    ADMISSION DATE:  11/24/2013  REFERRING MD :  [facility] PRIMARY SERVICE: PCCM  CHIEF COMPLAINT:  B LE pain  BRIEF PATIENT DESCRIPTION: 80 yo smoker w CAD/CABG, ischemic CM / CHF, ESRD, DM, severe COPD. Was well until developed B thigh and pelvis pain after HD on 6/23.  [facility].    SIGNIFICANT EVENTS / STUDIES:  CT head 6/23 >> no acute abnormalities CT abd/pelvis 6/23 >> renal atrophy, rectal wall thickening ? Significance, extensive athlerosclerosis CXR 6/23 [facility]([place]) >> cardiomegaly, pacer, minimal perihilar infiltrates w little change from priors TTE 6/25 >> LVH, mod-to-severe decrease LV fxn, severe TR and moderate MR, no clear-cut vegetations  LINES / TUBES: L femoral CVC 6/23 >> out piv  CULTURES: Blood 6/23 [facility]([place]) >> GPC's >> enterococcus >>  Blood 6/24 ([facility]) >> GPC chains >> enterococcus ss ampiillin  ANTIBIOTICS: Per ID vanco 6/24 >>  Cefepime 6/24 >> 6/27 Gent 6/29>>  HISTORY OF PRESENT ILLNESS:  80 yo smoker w CAD/CABG, ischemic CM / CHF, ESRD, DM, severe COPD. She has been experiencing nausea, vomiting since middle of last week, was initially constipated but then had diarrhea through the weekend. Her Po intake has been decreased and she missed HD once last week. She felt well before going to HD on 6/23, but then experienced severe LE pain and cramping after she got home. This prompted her to go to the ED. She was found to be hypotensive, received ABX + IVF, [facility].   SUBJECTIVE:  Dos not feel well. VITAL SIGNS: Temp:  [98 F (36.7 C)-98.6 F (37 C)] 98.4 F (36.9 C) (06/29 0709) Pulse Rate:  [73-87] 78 (06/29 0900) Resp:  [15-20] 15 (06/29 0709) BP: (120-149)/(49-67) 122/49 mmHg (06/29 0900) SpO2:  [96 %-100 %] 100 % (06/29 0830) Weight:  [113 lb 12.1 oz (51.6 kg)-115 lb 11.9 oz (52.5 kg)] 115 lb 11.9  oz (52.5 kg) (06/29 0709) HEMODYNAMICS:   VENTILATOR SETTINGS:   INTAKE / OUTPUT: Intake/Output     06/28 0701 - 06/29 0700 06/29 0701 - 06/30 0700   P.O. 320    Total Intake(mL/kg) 320 (6.2)    Net +320            PHYSICAL EXAMINATION: General:  Ill appearing elderly woman, NAD Neuro:  Awake, alert, oriented to self and place, follows commands HEENT:  Op clear, no icterus,  Cardiovascular:  Regular 70, distant, 2-3 syst M  LUSB-TR Lungs:  Very decreased, few basilar insp crackles Abdomen: mild diffuse tenderness, bowel sounds present, no guarding Musculoskeletal:  No lesions. Shunt access LUA. Poor tissue turgor, muscle wasting Skin:  No rash  LABS:  CBC  Recent Labs Lab 11/25/13 0434 11/26/13 0722 11/29/13 0720  WBC 8.3 7.7 5.4  HGB 8.3* 8.6* 8.9*  HCT 26.4* 27.4* 28.5*  PLT 127* 130* 174   Coag's  Recent Labs Lab 11/24/13 0200 11/27/13 0430  INR 1.17 1.03   BMET  Recent Labs Lab 11/26/13 0722 11/27/13 0430 11/29/13 0720  NA 135* 139 136*  K 5.3 4.4 5.3  CL 97 99 95*  CO2 22 27 26   BUN 48* 22 38*  CREATININE 5.01* 3.06* 5.07*  GLUCOSE 75 76 83   Electrolytes  Recent Labs Lab 11/24/13 0200 11/25/13 0434 11/26/13 0722 11/27/13 0430 11/29/13 0720  CALCIUM 7.4* 8.8 10.1 10.1 9.5  MG 1.6 1.7  --  1.7  --   PHOS 3.8 4.8* 5.3* 3.0 4.4   Sepsis Markers  Recent Labs Lab 11/24/13 0200 11/27/13 0430 11/28/13 0530  PROCALCITON 59.14 54.30 41.35   ABG No results found for this basename: PHART, PCO2ART, PO2ART,  in the last 168 hours Liver Enzymes  Recent Labs Lab 11/24/13 0200 11/26/13 0722 11/29/13 0720  AST 43*  --   --   ALT 32  --   --   ALKPHOS 150*  --   --   BILITOT 0.3  --   --   ALBUMIN 1.9* 2.1* 2.1*   Cardiac Enzymes  Recent Labs Lab 11/24/13 0200 11/24/13 0731 11/24/13 1315  TROPONINI <0.30 <0.30 <0.30   Glucose  Recent Labs Lab 11/27/13 1620 11/27/13 2131 11/28/13 0739 11/28/13 1121 11/28/13 1722  11/28/13 2107  GLUCAP 104* 93 71 84 98 88    Imaging No results found.   ASSESSMENT / PLAN:  PULMONARY A: severe copd, without evidence AE; no current wheezing hypoxemia P:   Hold spiriva (unclear whether she was actually taking), may decide to convert back to spiriva before discharge Scheduled nebs No steroids at this time    CARDIOVASCULAR A: shock, hypovolemic and septic CAD HTN Chronic Ischemic CHF Pacemaker Hyperlipidemia Systolic murmur c/w tricuspid regurgitation 2-3/6 - severe by Echo Poor venous access, with Shunt L arm. Want to avoid more iv foreign bodies, but may need cvl for access P:  Pressors now off BP regimen held, restart when stable to do so > normotensive now off meds  RENAL A:  CKD on HD P:   HD on 6/26 per renal plans.  Discussed plans with Dr Hyman HopesWebb, she will likely need extended course outpt abx at HD Follow electrolytes  GASTROINTESTINAL A:  Recent gastroenteritis Acid prophylaxis A: Left abdominal pain- Abd xr showed stool, atherosclerosis. Chronic Percocet for this. Possibly ischemic pain. BP should support return of percocet. P:   PPI Percocet  HEMATOLOGIC A:  DVT prophylaxis P:  Heparin   INFECTIOUS A:  GPC bacteremia > enterococcus. ID seeing for enterococcal bacteremia with TR c/w R sided bacterial endocarditis, likely involving AICD P:   Gent+ vanco, dosed for renal failure. At some risk for VRE although afebrile and improving sepsis.    ENDOCRINE A:  DM Hypothyroidism  > TSH 6.38, improved from 2 weeks ago but still elevated Random Cortisol 20 P:   ssi protocol Continue same synthroid dose and follow TSH for normalization  NEUROLOGIC A:  Mild encephalopathy, suspect metabolic. [facility] P:   Supportive care  PROTEIN CALORIE MALNUTRITION- poor po intake, cachectic with poor skin and muscle turgor P- dietician   TODAY'S SUMMARY:  Treating enterococcal bacteremia, supposed source is HD.  ID  support asppreciated. Severe TR c/w bacterial endocarditis.  Resumed home percocet for chronic abd pain.   PCCM will ask [facility] to assume care 6/30. [facility] called and agreed. NP A. Rennis HardingEllis confirmed via text.  Brett CanalesSteve Minor ACNP [facility] Pager [phone] till 3 pm If no answer page [phone] 11/29/2013, 9:30 AM  Will continue abx for now, needs additional rehab.  [facility] PCCM will sign off, please call back if needed.  Patient seen and examined, agree with above note.  I dictated the care and orders written for this patient under my direction.  Alyson ReedyWesam G Yacoub, MD [phone]

## 2013-11-30 LAB — CULTURE, BLOOD (ROUTINE X 2): CULTURE: NO GROWTH

## 2013-11-30 LAB — GLUCOSE, CAPILLARY
GLUCOSE-CAPILLARY: 81 mg/dL (ref 70–99)
GLUCOSE-CAPILLARY: 113 mg/dL — AB (ref 70–99)
Glucose-Capillary: 101 mg/dL — ABNORMAL HIGH (ref 70–99)
Glucose-Capillary: 98 mg/dL (ref 70–99)

## 2013-11-30 LAB — BASIC METABOLIC PANEL
BUN: 21 mg/dL (ref 6–23)
CO2: 28 mEq/L (ref 19–32)
Calcium: 8.7 mg/dL (ref 8.4–10.5)
Chloride: 97 mEq/L (ref 96–112)
Creatinine, Ser: 3.37 mg/dL — ABNORMAL HIGH (ref 0.50–1.10)
GFR calc Af Amer: 14 mL/min — ABNORMAL LOW (ref >90)
GFR calc non Af Amer: 12 mL/min — ABNORMAL LOW (ref >90)
Glucose, Bld: 78 mg/dL (ref 70–99)
Potassium: 4.6 mEq/L (ref 3.7–5.3)
Sodium: 139 mEq/L (ref 137–147)

## 2013-11-30 MED ORDER — NA FERRIC GLUC CPLX IN SUCROSE 12.5 MG/ML IV SOLN
62.5000 mg | INTRAVENOUS | Status: DC
  Administered 2013-12-01: 62.5 mg via INTRAVENOUS
  Filled 2013-11-30: qty 5

## 2013-11-30 MED ORDER — LORAZEPAM 0.5 MG PO TABS
0.5000 mg | ORAL_TABLET | Freq: Once | ORAL | Status: AC
  Administered 2013-11-30: 0.5 mg via ORAL
  Filled 2013-11-30: qty 1

## 2013-11-30 MED ORDER — ALPRAZOLAM 0.25 MG PO TABS
0.2500 mg | ORAL_TABLET | Freq: Three times a day (TID) | ORAL | Status: DC | PRN
  Administered 2013-11-30 – 2013-12-03 (×5): 0.25 mg via ORAL
  Filled 2013-11-30 (×5): qty 1

## 2013-11-30 NOTE — Evaluation (Signed)
Occupational Therapy Evaluation Patient Details Name: [name] MRN: [id number] DOB: 06-18-1932 Today's Date: 11/30/2013    History of Present Illness Pt admitted with severe LE pain which has since resolved and hypotension following HD.  Found to have bacteremia due to enterococcus, septic shock.  PMH includes:  CAD, advanced COPD, ESRD.  Pt was recently admitted with pulmonary edema and gastroenteritis per chart review.   Clinical Impression   Pt was assisted for all personal care prior to admission, was supervised for ambulation with her RW, and caregiver also performed all housekeeping and meal prep for pt.  All equipment needs are met in the home.  Pt currently requires min guard assist for standing ADL and set up if seated.  No further OT needs.    Follow Up Recommendations  No OT follow up;Supervision/Assistance - 24 hour    Equipment Recommendations  None recommended by OT    Recommendations for Other Services       Precautions / Restrictions Precautions Precautions: Fall Restrictions Weight Bearing Restrictions: No      Mobility Bed Mobility Overal bed mobility: Modified Independent             General bed mobility comments: used rail, HOB flat  Transfers Overall transfer level: Needs assistance Equipment used: 1 person hand held assist Transfers: Sit to/from Stand Sit to Stand: Min guard         General transfer comment: mild unsteadiness     Balance                                            ADL Overall ADL's : At baseline                                       General ADL Comments: Pt demonstrated ability to perform dressing with min guard to set up assist. She self feeds and can groom at the sink with min guard assist.     Vision                     Perception     Praxis      Pertinent Vitals/Pain No pain, 02 at 96% on 2L     Hand Dominance Right   Extremity/Trunk Assessment Upper  Extremity Assessment Upper Extremity Assessment: Overall WFL for tasks assessed   Lower Extremity Assessment Lower Extremity Assessment: Defer to PT evaluation       Communication Communication Communication: No difficulties   Cognition Arousal/Alertness: Awake/alert Behavior During Therapy: WFL for tasks assessed/performed Overall Cognitive Status: Within Functional Limits for tasks assessed                     General Comments       Exercises       Shoulder Instructions      Home Living Family/patient expects to be discharged to:: Private residence Living Arrangements: Alone Available Help at Discharge: Personal care attendant;Family;Available 24 hours/day Type of Home: House             Bathroom Shower/Tub: Teacher, early years/pre: Standard     Home Equipment: Shower seat;Grab bars - tub/shower;Hand held Tourist information centre manager - 2 wheels;Bedside commode          Prior Functioning/Environment Level  of Independence: Needs assistance  Gait / Transfers Assistance Needed: uses a RW with supervision ADL's / Homemaking Assistance Needed: Caregiver helps with housekeeping, meals, and personal care.   Comments: daughter lives next door    OT Diagnosis:     OT Problem List:     OT Treatment/Interventions:      OT Goals(Current goals can be found in the care plan section) Acute Rehab OT Goals Patient Stated Goal: go home  OT Frequency:     Barriers to D/C:            Co-evaluation              End of Session    Activity Tolerance: Patient tolerated treatment well Patient left: in bed;with call bell/phone within reach;with family/visitor present (RT in room)   Time: 3559-7416 OT Time Calculation (min): 17 min Charges:  OT General Charges $OT Visit: 1 Procedure OT Evaluation $Initial OT Evaluation Tier I: 1 Procedure OT Treatments $Self Care/Home Management : 8-22 mins G-Codes:    Malka So 11/30/2013, 2:45  PM [phone]

## 2013-11-30 NOTE — Progress Notes (Signed)
[facility] PROGRESS NOTE  Maria LefevreVera M [name] WUJ:[id number]:[id number] DOB: 1932/07/13 DOA: 11/24/2013 PCP: Galvin ProfferHAGUE, IMRAN P, MD  Assessment/Plan: Principal Problem:   Bacteremia due to Enterococcus Active Problems:   Coronary artery disease   COPD (chronic obstructive pulmonary disease)   ESRD (end stage renal disease) on dialysis   Unspecified hypothyroidism   Shock   Abdominal pain, left upper quadrant   AICD (automatic cardioverter/defibrillator) present   Tricuspid valve regurgitation due to infection   Bacterial endocarditis   Protein-calorie malnutrition, severe   80 y.o. female with a history of hypertension, CAD, COPD, and ESRD on dialysis at the [facility], recently at [facility] 6/13-16 for pulmonary edema secondary to noncompliance with dialysis, who returned home from her treatment yesterday and had sudden onset of severe lower extremity pain, bilaterally from her pelvis to her knees, requiring transfer by EMS to [facility]. In the ED her CT showed only extensive atherosclerosis, and chest x-ray showed minimal perihilar infiltrates, unchanged from prior imaging, but she was found to be hypotensive with blood pressure as low as 60/15 and was [facility]. She was transferred to ICU and started on Levophed,given antibiotics for possible septic shock. She missed dialysis on Monday 6/22 secondary to temporary diarrhea, Both sets of blood cultures done today her have grown fully sensitive Enterococcus faecalis. One of 2 blood cultures here is also positive. Initially started on vancomycin and cefepime, cefepime discontinued 6/27. Non-vancomycin and gentamicin. Her leg pain is resolved and she is feeling better   Septic/hypovolemic shock/enterococcal bacteremia presumed right-sided endocarditis involving her AICD Infectious disease following, continue vancomycin gentamicin with hemodialysis echo 6/25 with TR and presumed right-side endocarditis involving AICD;   Off vasopressors, transferred to telemetry on 6/30 Duration of antibiotics to be specified by infectious disease   Advanced COPD Home oxygen dependent on 2 L Continue nebulizer treatments   End-stage renal disease Hemodialysis Monday Wednesday Friday @ [facility],   Hypertension Normotensive currently  Chronic Ischemic CHF  Pacemaker Systolic murmur c/w tricuspid regurgitation 2-3/6 - severe by Echo BP regimen held, restart when stable to do so > normotensive now off meds   Diabetes mellitus Continue sliding scale insulin   Hypothyroidism > TSH 6.38, improved from 2 weeks ago but still elevated  Random Cortisol 20  Encephalopathy Improved,  Consultants:  PC CM  Nephrology  Infectious disease  SIGNIFICANT EVENTS / STUDIES:  CT head 6/23 >> no acute abnormalities  CT abd/pelvis 6/23 >> renal atrophy, rectal wall thickening ? Significance, extensive athlerosclerosis  CXR 6/23 [facility]([place]) >> cardiomegaly, pacer, minimal perihilar infiltrates w little change from priors  TTE 6/25 >> LVH, mod-to-severe decrease LV fxn, severe TR and moderate MR, no clear-cut vegetations  LINES / TUBES:  L femoral CVC 6/23 >> out  piv  CULTURES:  Blood 6/23 [facility]([place]) >> GPC's >> enterococcus >>  Blood 6/24 ([facility]) >> GPC chains >> enterococcus ss ampiillin  ANTIBIOTICS: Per ID  vanco 6/24 >>  Cefepime 6/24 >> 6/27  Gent 6/29>>   HPI/Subjective: No complaints this morning, hemodynamically stable  Objective: Filed Vitals:   11/29/13 2015 11/30/13 0124 11/30/13 0430 11/30/13 0838  BP: 126/53  128/61   Pulse: 77  80   Temp: 98.2 F (36.8 C)  98 F (36.7 C)   TempSrc:      Resp: 18  18   Height:      Weight: 49.9 kg (110 lb 0.2 oz)     SpO2: 99% 100% 99% 100%  Intake/Output Summary (Last 24 hours) at 11/30/13 0851 Last data filed at 11/30/13 1610  Gross per 24 hour  Intake    720 ml  Output   1900 ml  Net  -1180 ml    Exam:  General: She is upset about being in  the hospital but otherwise in no distress  Skin: Scattered ecchymoses on her arms and legs. No splinter or conjunctival hemorrhages noted  Lungs: Clear  Cor: Regular S1 and S2. 2/6 systolic murmur heard at left lower sternal border  Chest wall: No abnormality noted around right anterior chest AICD  Abdomen: Soft and nontender  Joints and extremities: No acute abnormalities noted    Data Reviewed: Basic Metabolic Panel:  Recent Labs Lab 11/24/13 0200 11/25/13 0434 11/26/13 0722 11/27/13 0430 11/29/13 0720 11/30/13 0523  NA 136* 136* 135* 139 136* 139  K 3.9 4.6 5.3 4.4 5.3 4.6  CL 96 99 97 99 95* 97  CO2 25 23 22 27 26 28   GLUCOSE 109* 81 75 76 83 78  BUN 31* 38* 48* 22 38* 21  CREATININE 3.51* 4.20* 5.01* 3.06* 5.07* 3.37*  CALCIUM 7.4* 8.8 10.1 10.1 9.5 8.7  MG 1.6 1.7  --  1.7  --   --   PHOS 3.8 4.8* 5.3* 3.0 4.4  --     Liver Function Tests:  Recent Labs Lab 11/24/13 0200 11/26/13 0722 11/29/13 0720  AST 43*  --   --   ALT 32  --   --   ALKPHOS 150*  --   --   BILITOT 0.3  --   --   PROT 4.7*  --   --   ALBUMIN 1.9* 2.1* 2.1*   No results found for this basename: LIPASE, AMYLASE,  in the last 168 hours No results found for this basename: AMMONIA,  in the last 168 hours  CBC:  Recent Labs Lab 11/24/13 0200 11/25/13 0434 11/26/13 0722 11/29/13 0720  WBC 14.1* 8.3 7.7 5.4  HGB 7.9* 8.3* 8.6* 8.9*  HCT 25.0* 26.4* 27.4* 28.5*  MCV 102.0* 101.5* 103.4* 104.4*  PLT 119* 127* 130* 174    Cardiac Enzymes:  Recent Labs Lab 11/24/13 0200 11/24/13 0731 11/24/13 1315  TROPONINI <0.30 <0.30 <0.30   BNP (last 3 results) No results found for this basename: PROBNP,  in the last 8760 hours   CBG:  Recent Labs Lab 11/29/13 1144 11/29/13 1301 11/29/13 1730 11/29/13 2017 11/30/13 0750  GLUCAP 69* 94 100* 126* 81    Recent Results (from the past 240 hour(s))  MRSA PCR SCREENING     Status: None   Collection Time    11/24/13 12:53 AM       Result Value Ref Range Status   MRSA by PCR NEGATIVE  NEGATIVE Final   Comment:            The GeneXpert MRSA Assay (FDA     approved for NASAL specimens     only), is one component of a     comprehensive MRSA colonization     surveillance program. It is not     intended to diagnose MRSA     infection nor to guide or     monitor treatment for     MRSA infections.  CULTURE, BLOOD (ROUTINE X 2)     Status: None   Collection Time    11/24/13  2:00 AM      Result Value Ref Range Status   Specimen Description BLOOD RIGHT  HAND   Final   Special Requests BOTTLES DRAWN AEROBIC ONLY 1CC   Final   Culture  Setup Time     Final   Value: 11/24/2013 08:19     Performed at [facility]   Culture     Final   Value: ENTEROCOCCUS SPECIES     Note: COMBINATION THERAPY OF HIGH DOSE AMPICILLIN OR VANCOMYCIN, PLUS AN AMINOGLYCOSIDE, IS USUALLY INDICATED FOR SERIOUS ENTEROCOCCAL INFECTIONS.     Note: Gram Stain Report Called to,Read Back By and Verified With: SARA GROCE 11/26/13 0900 BY SMITHERSJ     Performed at [facility]   Report Status 11/28/2013 FINAL   Final   Organism ID, Bacteria ENTEROCOCCUS SPECIES   Final  CULTURE, BLOOD (ROUTINE X 2)     Status: None   Collection Time    11/24/13  2:31 AM      Result Value Ref Range Status   Specimen Description BLOOD RIGHT THUMB   Final   Special Requests BOTTLES DRAWN AEROBIC ONLY 3CC   Final   Culture  Setup Time     Final   Value: 11/24/2013 08:20     Performed at [facility]   Culture     Final   Value:        BLOOD CULTURE RECEIVED NO GROWTH TO DATE CULTURE WILL BE HELD FOR 5 DAYS BEFORE ISSUING A FINAL NEGATIVE REPORT     Performed at [facility]   Report Status PENDING   Incomplete  CULTURE, BLOOD (ROUTINE X 2)     Status: None   Collection Time    11/27/13  4:30 PM      Result Value Ref Range Status   Specimen Description BLOOD RIGHT HAND   Final   Special Requests BOTTLES DRAWN AEROBIC ONLY 10 CC    Final   Culture  Setup Time     Final   Value: 11/28/2013 02:23     Performed at [facility]   Culture     Final   Value:        BLOOD CULTURE RECEIVED NO GROWTH TO DATE CULTURE WILL BE HELD FOR 5 DAYS BEFORE ISSUING A FINAL NEGATIVE REPORT     Performed at [facility]   Report Status PENDING   Incomplete  CULTURE, BLOOD (ROUTINE X 2)     Status: None   Collection Time    11/27/13  4:30 PM      Result Value Ref Range Status   Specimen Description BLOOD RIGHT ARM   Final   Special Requests BOTTLES DRAWN AEROBIC ONLY 9.5 CC   Final   Culture  Setup Time     Final   Value: 11/28/2013 02:23     Performed at [facility]   Culture     Final   Value:        BLOOD CULTURE RECEIVED NO GROWTH TO DATE CULTURE WILL BE HELD FOR 5 DAYS BEFORE ISSUING A FINAL NEGATIVE REPORT     Performed at [facility]   Report Status PENDING   Incomplete     Studies: Ir Pta Venous Left  11/16/2013   CLINICAL DATA:  80 year old female with end-stage renal disease on hemodialysis via a left upper extremity brachiocephalic arteriovenous fistula. The fistula was created approximately 12 years ago. She has been using this access for the last 4- 5 years. She has been suffering with prolonged (45 min) bleeding times following dialysis concerning for  stenosis of the draining or central veins.  EXAM: IR LEFT SHUNTOGRAM/FISTULAGRAM; PTA VENOUS  Date: 11/16/2013  PROCEDURE: 1. Diagnostic fistulogram 2. Angioplasty of tandem stenoses of the draining vein 3. Completion fistulogram Interventional Radiologist:  Sterling Big, MD  ANESTHESIA/SEDATION: None required  FLUOROSCOPY TIME:  1 minute 24 seconds  CONTRAST:  50mL OMNIPAQUE IOHEXOL 300 MG/ML  SOLN  TECHNIQUE: Informed consent was obtained from the patient following explanation of the procedure, risks, benefits and alternatives. The patient understands, agrees and consents for the procedure. All questions were addressed. A time out was  performed.  Maximal barrier sterile technique utilized including caps, mask, sterile gowns, sterile gloves, large sterile drape, hand hygiene, and Betadine skin prep.  The fistula was accessed percutaneously adjacent to the arterial anastomosis with an 18 gauge angiocatheter. A diagnostic fistulogram was performed. 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 Chest central to this in the mid arm and there is a segment of a peripherally calcified vein with tandem stenoses. The more peripheral stenosis is moderately high grade (60- 70%) the second stenosis is more web-like and approximately 50% diameter stenosis. The cephalic arch and central veins are widely patent. The arterial anastomosis is widely patent.  The risks, benefits and alternatives to angioplasty of the tandem stenotic venous segment were described to the patient. She understands and wishes to proceed. Therefore, a Bentson wire was advanced into the central veins. The angiocatheter was exchanged over the wire for a working 6 [place] vascular sheath. A 6 x 40 mm Conquest balloon was positioned across the tandem stenoses and inflated to 20 atmospheres with full effacement of the balloon. The inflation was held for 2 min.  The balloon was deflated and removed. Follow-up fistulogram demonstrates near complete resolution of the tandem stenoses. The more severe stenosis demonstrates an approximate 10% residual narrowing. The more moderate stenosis demonstrates no residual narrowing. There is brisk flow.  The vascular sheath was removed and hemostasis was attained with the assistance of a 2-0 nylon pursestring suture. The patient tolerated the procedure very well.  IMPRESSION: 1. Diagnostic fistulogram demonstrates tandem stenoses in the mid aspect of the draining cephalic vein of the left upper extremity brachiocephalic arteriovenous fistula. 2. Angioplasty of both stenoses to 6 mm with  excellent angiographic result. Signed,  Sterling Big, MD  [facility]  [facility]   Electronically Signed   By: Malachy Moan M.D.   On: 11/16/2013 17:07   Dg Chest Port 1 View  11/25/2013   CLINICAL DATA:  Hypoxemia  EXAM: PORTABLE CHEST - 1 VIEW  COMPARISON:  11/23/2013  FINDINGS: Diffuse mild bilateral interstitial thickening. No pleural effusion or pneumothorax. Enlargement of the central pulmonary vasculature. Stable cardiomegaly. Prior CABG. Dual lead AICD. Unremarkable osseous structures.  The osseous structures are unremarkable.  IMPRESSION: Mild pulmonary venous congestion.   Electronically Signed   By: Elige Ko   On: 11/25/2013 18:56   Dg Chest Port 1 View  11/14/2013   CLINICAL DATA:  Shortness of breath.  Cough.  EXAM: PORTABLE CHEST - 1 VIEW  COMPARISON:  Chest x-ray 11/13/2013.  FINDINGS: Cardiomegaly with pulmonary vascular prominence and interstitial prominence present consistent with congestive heart failure with pulmonary interstitial edema. Pneumonitis cannot be excluded. Cardiac pacer with lead tips in right atrium and right ventricle. Prior CABG. No pneumothorax. No acute bony abnormality.  IMPRESSION: Congestive heart failure with pulmonary interstitial edema.   Electronically Signed  ByMaisie Fus  Register   On: 11/14/2013 14:47   Dg Abd Portable 2v  11/14/2013   CLINICAL DATA:  Abdominal pain.  Constipation .  EXAM: PORTABLE ABDOMEN - 2 VIEW  COMPARISON:  CT 06/29/2013  FINDINGS: Soft tissue structures are unremarkable. Moderate amount of stool noted: Marland Kitchen The gas pattern is nonspecific. Surgical clips right upper quadrant. Aortoiliac and visceral atherosclerotic vascular disease. Bilateral iliac stents are noted. Pelvic phleboliths. Degenerative changes lumbar spine and both hips. Cardiomegaly. Cardiac pacer.  IMPRESSION: 1. Moderate amount of stool in colon.  No bowel distention. 2. Aortoiliac atherosclerotic vascular  disease. Bilateral iliac stents are present. Visceral atherosclerotic vascular disease. 3. Cholecystectomy. 4. Cardiomegaly.  Cardiac pacer.   Electronically Signed   By: Maisie Fus  Register   On: 11/14/2013 08:04   Ir Shuntogram/ Fistulagram Left Mod Sed  11/16/2013   CLINICAL DATA:  80 year old female with end-stage renal disease on hemodialysis via a left upper extremity brachiocephalic arteriovenous fistula. The fistula was created approximately 12 years ago. She has been using this access for the last 4- 5 years. She has been suffering with prolonged (45 min) bleeding times following dialysis concerning for stenosis of the draining or central veins.  EXAM: IR LEFT SHUNTOGRAM/FISTULAGRAM; PTA VENOUS  Date: 11/16/2013  PROCEDURE: 1. Diagnostic fistulogram 2. Angioplasty of tandem stenoses of the draining vein 3. Completion fistulogram Interventional Radiologist:  Sterling Big, MD  ANESTHESIA/SEDATION: None required  FLUOROSCOPY TIME:  1 minute 24 seconds  CONTRAST:  50mL OMNIPAQUE IOHEXOL 300 MG/ML  SOLN  TECHNIQUE: Informed consent was obtained from the patient following explanation of the procedure, risks, benefits and alternatives. The patient understands, agrees and consents for the procedure. All questions were addressed. A time out was performed.  Maximal barrier sterile technique utilized including caps, mask, sterile gowns, sterile gloves, large sterile drape, hand hygiene, and Betadine skin prep.  The fistula was accessed percutaneously adjacent to the arterial anastomosis with an 18 gauge angiocatheter. A diagnostic fistulogram was performed. 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 Chest central to this in the mid arm and there is a segment of a peripherally calcified vein with tandem stenoses. The more peripheral stenosis is moderately high grade (60- 70%) the second stenosis is more web-like and approximately 50% diameter  stenosis. The cephalic arch and central veins are widely patent. The arterial anastomosis is widely patent.  The risks, benefits and alternatives to angioplasty of the tandem stenotic venous segment were described to the patient. She understands and wishes to proceed. Therefore, a Bentson wire was advanced into the central veins. The angiocatheter was exchanged over the wire for a working 6 [place] vascular sheath. A 6 x 40 mm Conquest balloon was positioned across the tandem stenoses and inflated to 20 atmospheres with full effacement of the balloon. The inflation was held for 2 min.  The balloon was deflated and removed. Follow-up fistulogram demonstrates near complete resolution of the tandem stenoses. The more severe stenosis demonstrates an approximate 10% residual narrowing. The more moderate stenosis demonstrates no residual narrowing. There is brisk flow.  The vascular sheath was removed and hemostasis was attained with the assistance of a 2-0 nylon pursestring suture. The patient tolerated the procedure very well.  IMPRESSION: 1. Diagnostic fistulogram demonstrates tandem stenoses in the mid aspect of the draining cephalic vein of the left upper extremity brachiocephalic arteriovenous fistula. 2. Angioplasty of both stenoses to 6 mm with excellent angiographic result. Signed,  Sterling BigHeath K. McCullough, MD  [facility]  [facility]   Electronically Signed   By: Malachy MoanHeath  McCullough M.D.   On: 11/16/2013 17:07    Scheduled Meds: . antiseptic oral rinse  15 mL Mouth Rinse BID  . aspirin EC  81 mg Oral Daily  . atorvastatin  80 mg Oral Daily  . cinacalcet  60 mg Oral Q breakfast  . darbepoetin (ARANESP) injection - DIALYSIS  100 mcg Intravenous Q Mon-HD  . docusate sodium  200 mg Oral BID  . feeding supplement (RESOURCE BREEZE)  1 Container Oral BID BM  . folic acid  0.5 mg Oral Daily  . [START ON 12/01/2013] gentamicin  60 mg Intravenous Q M,W,F-HD  . heparin   5,000 Units Subcutaneous 3 times per day  . insulin aspart  0-5 Units Subcutaneous QHS  . insulin aspart  0-9 Units Subcutaneous TID WC  . ipratropium-albuterol  3 mL Nebulization Q6H  . levothyroxine  150 mcg Oral QAC breakfast  . pantoprazole  40 mg Oral Daily  . vancomycin  500 mg Intravenous Q M,W,F-HD   Continuous Infusions: . sodium chloride Stopped (11/27/13 0746)    Principal Problem:   Bacteremia due to Enterococcus Active Problems:   Coronary artery disease   COPD (chronic obstructive pulmonary disease)   ESRD (end stage renal disease) on dialysis   Unspecified hypothyroidism   Shock   Abdominal pain, left upper quadrant   AICD (automatic cardioverter/defibrillator) present   Tricuspid valve regurgitation due to infection   Bacterial endocarditis   Protein-calorie malnutrition, severe    Time spent: 40 minutes   [facility],NAYANA  [facility] Pager [phone]. If 8PM-8AM, please contact night-coverage at [email or web address], password [email or web address] 11/30/2013, 8:51 AM  LOS: 6 days

## 2013-11-30 NOTE — Progress Notes (Signed)
[facility] Progress Note  Subjective:   No sleeping well. No other emerging complaints  Objective Filed Vitals:   11/29/13 2015 11/30/13 0124 11/30/13 0430 11/30/13 0838  BP: 126/53  128/61   Pulse: 77  80   Temp: 98.2 F (36.8 C)  98 F (36.7 C)   TempSrc:      Resp: 18  18   Height:      Weight: 49.9 kg (110 lb 0.2 oz)     SpO2: 99% 100% 99% 100%   Physical Exam General: Alert, cooperative, NAD Heart: RRR Lungs: Diminished at bases. No wheezes/rales Abdomen: Soft, non-tender, +BS Extremities: Weeping clear drainage from RUE. No LE edema Dialysis Access: L AVF, + bruit  Dialysis Orders: MWF @ [facility]  3:30 44.5 kg 2K/2Ca 350/A1.5 Profile 2 No Heparin AVF @ [facility]  No Hectorol Aranesp 40 mcg & Venofer 50 mg on Wed   Assessment/Plan: 1. Enterococcal bacteremia - Mgmt per primary/ID. Echo with TR and suspected R-sided endocarditis involving AICD leads. Now on vanc and gent. Afebrile. 2. ESRD - HD on MWF, K+ 4.6. Next HD tomorrow 3. Hypotension/volume - SBPs 120s. No meds. Inpatient wgts variable. UF as tolerated 4. Anemia - Hgb 8.9 on Aranesp 100 q Mon. Continue Weekly Fe 5. Metabolic bone disease - Hypercalcemia resolving. Ca 8.7 (10.2 corrected), P 4.4, iPTH 225 on Sensipar 60 mg qd. Continue holding Phoslo 5 and Vit D3. Continue low Ca bath and follow labs. Adjust binders at d/c (non-Calcium vs lower dose of phoslo.) 6. Nutrition - Alb 2.1, renal carb-mod diet, vitamin. 7. DM - insulin per primary  Scot JunKaren E. Broadus JohnWarren, PA-C [facility] Pager [phone] 11/30/2013,9:28 AM  LOS: 6 days   Pt seen, examined and agree w A/P as above. No new problems from a renal standpoint.  Volume is up some, not sure if BP problems on HD vs we have not been aggressive enough with UF goals.   Vinson Moselleob Schertz MD pager [phone]    cell [phone] 11/30/2013, 11:35 AM      Additional Objective Labs: Basic Metabolic Panel:  Recent Labs Lab 11/26/13 0722  11/27/13 0430 11/29/13 0720 11/30/13 0523  NA 135* 139 136* 139  K 5.3 4.4 5.3 4.6  CL 97 99 95* 97  CO2 22 27 26 28   GLUCOSE 75 76 83 78  BUN 48* 22 38* 21  CREATININE 5.01* 3.06* 5.07* 3.37*  CALCIUM 10.1 10.1 9.5 8.7  PHOS 5.3* 3.0 4.4  --    Liver Function Tests:  Recent Labs Lab 11/24/13 0200 11/26/13 0722 11/29/13 0720  AST 43*  --   --   ALT 32  --   --   ALKPHOS 150*  --   --   BILITOT 0.3  --   --   PROT 4.7*  --   --   ALBUMIN 1.9* 2.1* 2.1*   CBC:  Recent Labs Lab 11/24/13 0200 11/25/13 0434 11/26/13 0722 11/29/13 0720  WBC 14.1* 8.3 7.7 5.4  HGB 7.9* 8.3* 8.6* 8.9*  HCT 25.0* 26.4* 27.4* 28.5*  MCV 102.0* 101.5* 103.4* 104.4*  PLT 119* 127* 130* 174   Blood Culture    Component Value Date/Time   SDES BLOOD RIGHT HAND 11/27/2013 1630   SDES BLOOD RIGHT ARM 11/27/2013 1630   SPECREQUEST BOTTLES DRAWN AEROBIC ONLY 10 CC 11/27/2013 1630   SPECREQUEST BOTTLES DRAWN AEROBIC ONLY 9.5 CC 11/27/2013 1630   CULT  Value:        BLOOD CULTURE  RECEIVED NO GROWTH TO DATE CULTURE WILL BE HELD FOR 5 DAYS BEFORE ISSUING A FINAL NEGATIVE REPORT [facility] 11/27/2013 1630   CULT  Value:        BLOOD CULTURE RECEIVED NO GROWTH TO DATE CULTURE WILL BE HELD FOR 5 DAYS BEFORE ISSUING A FINAL NEGATIVE REPORT Performed at [facility] 11/27/2013 1630   REPTSTATUS PENDING 11/27/2013 1630   REPTSTATUS PENDING 11/27/2013 1630    Cardiac Enzymes:  Recent Labs Lab 11/24/13 0200 11/24/13 0731 11/24/13 1315  TROPONINI <0.30 <0.30 <0.30   CBG:  Recent Labs Lab 11/29/13 1144 11/29/13 1301 11/29/13 1730 11/29/13 2017 11/30/13 0750  GLUCAP 69* 94 100* 126* 81    Studies/Results: No results found. Medications: . sodium chloride Stopped (11/27/13 0746)   . antiseptic oral rinse  15 mL Mouth Rinse BID  . aspirin EC  81 mg Oral Daily  . atorvastatin  80 mg Oral Daily  . cinacalcet  60 mg Oral Q breakfast  . darbepoetin (ARANESP)  injection - DIALYSIS  100 mcg Intravenous Q Mon-HD  . docusate sodium  200 mg Oral BID  . feeding supplement (RESOURCE BREEZE)  1 Container Oral BID BM  . [START ON 12/01/2013] ferric gluconate (FERRLECIT/NULECIT) IV  62.5 mg Intravenous Q Wed-HD  . folic acid  0.5 mg Oral Daily  . [START ON 12/01/2013] gentamicin  60 mg Intravenous Q M,W,F-HD  . heparin  5,000 Units Subcutaneous 3 times per day  . insulin aspart  0-5 Units Subcutaneous QHS  . insulin aspart  0-9 Units Subcutaneous TID WC  . ipratropium-albuterol  3 mL Nebulization Q6H  . levothyroxine  150 mcg Oral QAC breakfast  . pantoprazole  40 mg Oral Daily  . vancomycin  500 mg Intravenous Q M,W,F-HD

## 2013-11-30 NOTE — Progress Notes (Addendum)
Patient ID: Maria LefevreVera M Masley, female   DOB: 02-Sep-1932, 80 y.o.   MRN: [id number]         [facility]    Date of Admission:  11/24/2013           Day 8 vancomycin        Day 4 gentamicin  Principal Problem:   Bacteremia due to Enterococcus Active Problems:   Coronary artery disease   COPD (chronic obstructive pulmonary disease)   ESRD (end stage renal disease) on dialysis   Unspecified hypothyroidism   Shock   Abdominal pain, left upper quadrant   AICD (automatic cardioverter/defibrillator) present   Tricuspid valve regurgitation due to infection   Bacterial endocarditis   Protein-calorie malnutrition, severe   . antiseptic oral rinse  15 mL Mouth Rinse BID  . aspirin EC  81 mg Oral Daily  . atorvastatin  80 mg Oral Daily  . cinacalcet  60 mg Oral Q breakfast  . darbepoetin (ARANESP) injection - DIALYSIS  100 mcg Intravenous Q Mon-HD  . docusate sodium  200 mg Oral BID  . feeding supplement (RESOURCE BREEZE)  1 Container Oral BID BM  . [START ON 12/01/2013] ferric gluconate (FERRLECIT/NULECIT) IV  62.5 mg Intravenous Q Wed-HD  . folic acid  0.5 mg Oral Daily  . [START ON 12/01/2013] gentamicin  60 mg Intravenous Q M,W,F-HD  . heparin  5,000 Units Subcutaneous 3 times per day  . insulin aspart  0-5 Units Subcutaneous QHS  . insulin aspart  0-9 Units Subcutaneous TID WC  . ipratropium-albuterol  3 mL Nebulization Q6H  . levothyroxine  150 mcg Oral QAC breakfast  . pantoprazole  40 mg Oral Daily  . vancomycin  500 mg Intravenous Q M,W,F-HD    Subjective: Afebrile  Had device and leads changed in dec 2013, has had it initially x 14 yr. No hx of having TEE in the past.  Objective: Temp:  [98 F (36.7 C)-98.8 F (37.1 C)] 98.2 F (36.8 C) (06/30 0959) Pulse Rate:  [77-84] 84 (06/30 0959) Resp:  [18] 18 (06/30 0959) BP: (112-128)/(53-64) 126/62 mmHg (06/30 0959) SpO2:  [96 %-100 %] 96 % (06/30 1424) Weight:  [110 lb 0.2 oz (49.9 kg)] 110 lb 0.2 oz  (49.9 kg) (06/29 2015)  General: She alert and oriented, appears stated age, wearing nasal cannula Lungs: Clear  Cor: Regular S1 and S2. 2/6 systolic murmur heard at left lower sternal border  Chest wall: No abnormality noted around right anterior chest AICD slight echymosis superior to it, no warmth or fluctuance. Abdomen: Soft and nontender  Skin: Scattered ecchymoses on her arms and legs. No splinter or conjunctival hemorrhages noted   Lab Results Lab Results  Component Value Date   WBC 5.4 11/29/2013   HGB 8.9* 11/29/2013   HCT 28.5* 11/29/2013   MCV 104.4* 11/29/2013   PLT 174 11/29/2013    Lab Results  Component Value Date   CREATININE 3.37* 11/30/2013   BUN 21 11/30/2013   NA 139 11/30/2013   K 4.6 11/30/2013   CL 97 11/30/2013   CO2 28 11/30/2013    Lab Results  Component Value Date   ALT 32 11/24/2013   AST 43* 11/24/2013   ALKPHOS 150* 11/24/2013   BILITOT 0.3 11/24/2013      Microbiology: Recent Results (from the past 240 hour(s))  MRSA PCR SCREENING     Status: None   Collection Time    11/24/13 12:53 AM  Result Value Ref Range Status   MRSA by PCR NEGATIVE  NEGATIVE Final   Comment:            The GeneXpert MRSA Assay (FDA     approved for NASAL specimens     only), is one component of a     comprehensive MRSA colonization     surveillance program. It is not     intended to diagnose MRSA     infection nor to guide or     monitor treatment for     MRSA infections.  CULTURE, BLOOD (ROUTINE X 2)     Status: None   Collection Time    11/24/13  2:00 AM      Result Value Ref Range Status   Specimen Description BLOOD RIGHT HAND   Final   Special Requests BOTTLES DRAWN AEROBIC ONLY 1CC   Final   Culture  Setup Time     Final   Value: 11/24/2013 08:19     Performed at [facility]   Culture     Final   Value: ENTEROCOCCUS SPECIES     Note: COMBINATION THERAPY OF HIGH DOSE AMPICILLIN OR VANCOMYCIN, PLUS AN AMINOGLYCOSIDE, IS USUALLY INDICATED FOR SERIOUS  ENTEROCOCCAL INFECTIONS.     Note: Gram Stain Report Called to,Read Back By and Verified With: SARA GROCE 11/26/13 0900 BY SMITHERSJ     Performed at [facility]   Report Status 11/28/2013 FINAL   Final   Organism ID, Bacteria ENTEROCOCCUS SPECIES   Final  CULTURE, BLOOD (ROUTINE X 2)     Status: None   Collection Time    11/24/13  2:31 AM      Result Value Ref Range Status   Specimen Description BLOOD RIGHT THUMB   Final   Special Requests BOTTLES DRAWN AEROBIC ONLY 3CC   Final   Culture  Setup Time     Final   Value: 11/24/2013 08:20     Performed at [facility]   Culture     Final   Value: NO GROWTH 5 DAYS     Performed at [facility]   Report Status 11/30/2013 FINAL   Final  CULTURE, BLOOD (ROUTINE X 2)     Status: None   Collection Time    11/27/13  4:30 PM      Result Value Ref Range Status   Specimen Description BLOOD RIGHT HAND   Final   Special Requests BOTTLES DRAWN AEROBIC ONLY 10 CC   Final   Culture  Setup Time     Final   Value: 11/28/2013 02:23     Performed at [facility]   Culture     Final   Value:        BLOOD CULTURE RECEIVED NO GROWTH TO DATE CULTURE WILL BE HELD FOR 5 DAYS BEFORE ISSUING A FINAL NEGATIVE REPORT     Performed at [facility]   Report Status PENDING   Incomplete  CULTURE, BLOOD (ROUTINE X 2)     Status: None   Collection Time    11/27/13  4:30 PM      Result Value Ref Range Status   Specimen Description BLOOD RIGHT ARM   Final   Special Requests BOTTLES DRAWN AEROBIC ONLY 9.5 CC   Final   Culture  Setup Time     Final   Value: 11/28/2013 02:23     Performed at [facility]   Culture  Final   Value:        BLOOD CULTURE RECEIVED NO GROWTH TO DATE CULTURE WILL BE HELD FOR 5 DAYS BEFORE ISSUING A FINAL NEGATIVE REPORT     Performed at [facility]   Report Status PENDING   Incomplete    Studies/Results: No results found.  Assessment: I will continue vancomycin and  gentamicin after hemodialysis for enterococcal (amp S) bacteremia and presumed right-sided endocarditis involving her AICD.  Plan: 1. Will need to discuss with renal if patient can tolerate doing continuous infusion of ampicillin instead of vancomycin 2. Will treat with vanco plus gentamicin for now, but aim for ampicillin plus gentamicin for probable right sided cardiac device related endocarditis with enterococcus 3. Please have cardiology evaluate and assess patient for AICD related endocarditis and possibility of device removal 4. Bacteremia has cleared  Judyann Munson, MD [facility] [phone] pager   [phone] cell 11/30/2013, 4:34 PM

## 2013-11-30 NOTE — Progress Notes (Signed)
[place] stained. RT changed 

## 2013-11-30 NOTE — Evaluation (Signed)
Physical Therapy Evaluation Patient Details Name: Maria LefevreVera M [name] MRN: [id number] DOB: 10-11-1932 Today's Date: 11/30/2013   History of Present Illness  Pt admitted with severe LE pain which has since resolved and hypotension following HD.  Found to have bacteremia due to enterococcus, septic shock.  PMH includes:  CAD, advanced COPD, ESRD.  Pt was recently admitted with pulmonary edema and gastroenteritis per chart review.  Clinical Impression   Pt admitted with above. Pt currently with functional limitations due to the deficits listed below (see PT Problem List).  Pt will benefit from skilled PT to increase their independence and safety with mobility to allow discharge to the venue listed below.       Follow Up Recommendations Home health PT;Other (comment) (continue HHRN and Aide)    Equipment Recommendations  None recommended by PT    Recommendations for Other Services OT consult (already done)     Precautions / Restrictions Precautions Precautions: Fall Restrictions Weight Bearing Restrictions: No      Mobility  Bed Mobility Overal bed mobility: Modified Independent             General bed mobility comments: used rail, HOB flat  Transfers Overall transfer level: Needs assistance Equipment used: Rolling walker (2 wheeled) Transfers: Sit to/from Stand Sit to Stand: Min guard         General transfer comment: mild unsteadiness   Ambulation/Gait Ambulation/Gait assistance: Min guard Ambulation Distance (Feet): 75 Feet Assistive device: Rolling walker (2 wheeled) Gait Pattern/deviations: Step-through pattern Gait velocity: decr   General Gait Details: Good use of RW for support; cues to self-monitor for activity tolerance  Stairs            Wheelchair Mobility    Modified Rankin (Stroke Patients Only)       Balance                                             Pertinent Vitals/Pain Ambulated on 2 L O2; sats stayed at or above  93%    Home Living Family/patient expects to be discharged to:: Private residence Living Arrangements: Alone Available Help at Discharge: Personal care attendant;Family;Available 24 hours/day Type of Home: House Home Access: Ramped entrance     Home Layout: One level Home Equipment: Shower seat;Grab bars - tub/shower;Hand held shower head;Walker - 2 wheels;Bedside commode      Prior Function Level of Independence: Needs assistance   Gait / Transfers Assistance Needed: uses a RW with supervision  ADL's / Homemaking Assistance Needed: Caregiver helps with housekeeping, meals, and personal care.  Comments: daughter lives next door     Hand Dominance   Dominant Hand: Right    Extremity/Trunk Assessment   Upper Extremity Assessment: Overall WFL for tasks assessed           Lower Extremity Assessment: Generalized weakness         Communication   Communication: No difficulties  Cognition Arousal/Alertness: Awake/alert Behavior During Therapy: WFL for tasks assessed/performed Overall Cognitive Status: Within Functional Limits for tasks assessed                      General Comments      Exercises        Assessment/Plan    PT Assessment Patient needs continued PT services  PT Diagnosis Difficulty walking   PT Problem List Decreased strength;Decreased  activity tolerance;Decreased balance;Cardiopulmonary status limiting activity  PT Treatment Interventions DME instruction;Gait training;Functional mobility training;Therapeutic activities;Therapeutic exercise;Patient/family education   PT Goals (Current goals can be found in the Care Plan section) Acute Rehab PT Goals Patient Stated Goal: go home PT Goal Formulation: With patient Time For Goal Achievement: 12/14/13 Potential to Achieve Goals: Good    Frequency Min 3X/week   Barriers to discharge        Co-evaluation               End of Session Equipment Utilized During Treatment: Gait  belt;Oxygen Activity Tolerance: Patient tolerated treatment well Patient left: in bed;with call bell/phone within reach;Other (comment) (preparing to work with OT) Nurse Communication: Mobility status         Time: 1345-1410 PT Time Calculation (min): 25 min   Charges:   PT Evaluation $Initial PT Evaluation Tier I: 1 Procedure PT Treatments $Gait Training: 8-22 mins   PT G Codes:          Olen PelGarrigan, Holly Hamff 11/30/2013, 3:39 PM Van ClinesHolly Garrigan, [place]  Acute Rehabilitation Services Pager [phone] Office [phone]

## 2013-12-01 ENCOUNTER — Encounter (HOSPITAL_COMMUNITY): Payer: Self-pay | Admitting: *Deleted

## 2013-12-01 DIAGNOSIS — I4891 Unspecified atrial fibrillation: Secondary | ICD-10-CM

## 2013-12-01 DIAGNOSIS — Z1159 Encounter for screening for other viral diseases: Secondary | ICD-10-CM

## 2013-12-01 DIAGNOSIS — I442 Atrioventricular block, complete: Secondary | ICD-10-CM

## 2013-12-01 DIAGNOSIS — T827XXA Infection and inflammatory reaction due to other cardiac and vascular devices, implants and grafts, initial encounter: Principal | ICD-10-CM

## 2013-12-01 LAB — CBC
HEMATOCRIT: 30.5 % — AB (ref 36.0–46.0)
HEMOGLOBIN: 9.4 g/dL — AB (ref 12.0–15.0)
MCH: 32.9 pg (ref 26.0–34.0)
MCHC: 30.8 g/dL (ref 30.0–36.0)
MCV: 106.6 fL — ABNORMAL HIGH (ref 78.0–100.0)
Platelets: 204 10*3/uL (ref 150–400)
RBC: 2.86 MIL/uL — ABNORMAL LOW (ref 3.87–5.11)
RDW: 17.6 % — ABNORMAL HIGH (ref 11.5–15.5)
WBC: 6.7 10*3/uL (ref 4.0–10.5)

## 2013-12-01 LAB — GLUCOSE, CAPILLARY
GLUCOSE-CAPILLARY: 74 mg/dL (ref 70–99)
Glucose-Capillary: 101 mg/dL — ABNORMAL HIGH (ref 70–99)

## 2013-12-01 LAB — RENAL FUNCTION PANEL
Albumin: 2.2 g/dL — ABNORMAL LOW (ref 3.5–5.2)
BUN: 30 mg/dL — AB (ref 6–23)
CO2: 24 mEq/L (ref 19–32)
Calcium: 8.8 mg/dL (ref 8.4–10.5)
Chloride: 101 mEq/L (ref 96–112)
Creatinine, Ser: 4.75 mg/dL — ABNORMAL HIGH (ref 0.50–1.10)
GFR calc Af Amer: 9 mL/min — ABNORMAL LOW (ref >90)
GFR calc non Af Amer: 8 mL/min — ABNORMAL LOW (ref >90)
GLUCOSE: 90 mg/dL (ref 70–99)
PHOSPHORUS: 4.2 mg/dL (ref 2.3–4.6)
Potassium: 5 mEq/L (ref 3.7–5.3)
Sodium: 140 mEq/L (ref 137–147)

## 2013-12-01 LAB — VANCOMYCIN, RANDOM: Vancomycin Rm: 9.6 ug/mL

## 2013-12-01 MED ORDER — CALCIUM ACETATE 667 MG PO CAPS: 3335.0000 mg | ORAL_CAPSULE | Freq: Three times a day (TID) | ORAL | Status: DC

## 2013-12-01 MED ORDER — TIOTROPIUM BROMIDE MONOHYDRATE 18 MCG IN CAPS
18.0000 ug | ORAL_CAPSULE | Freq: Every day | RESPIRATORY_TRACT | Status: DC
  Filled 2013-12-01: qty 5

## 2013-12-01 MED ORDER — TEMAZEPAM 15 MG PO CAPS
30.0000 mg | ORAL_CAPSULE | Freq: Every evening | ORAL | Status: DC | PRN
  Administered 2013-12-01 – 2013-12-02 (×2): 30 mg via ORAL
  Filled 2013-12-01 (×2): qty 2

## 2013-12-01 MED ORDER — IPRATROPIUM-ALBUTEROL 0.5-2.5 (3) MG/3ML IN SOLN: 3.0000 mL | Freq: Four times a day (QID) | RESPIRATORY_TRACT | Status: DC | PRN

## 2013-12-01 MED ORDER — VANCOMYCIN HCL IN DEXTROSE 750-5 MG/150ML-% IV SOLN
750.0000 mg | INTRAVENOUS | Status: DC
  Administered 2013-12-01: 750 mg via INTRAVENOUS
  Filled 2013-12-01: qty 150

## 2013-12-01 MED ORDER — SODIUM CHLORIDE 0.9 % IV SOLN
2.0000 g | Freq: Two times a day (BID) | INTRAVENOUS | Status: DC
  Administered 2013-12-01 – 2013-12-03 (×5): 2 g via INTRAVENOUS
  Filled 2013-12-01 (×6): qty 2000

## 2013-12-01 MED ORDER — OLOPATADINE HCL 0.1 % OP SOLN
1.0000 [drp] | Freq: Two times a day (BID) | OPHTHALMIC | Status: DC
  Administered 2013-12-01 – 2013-12-03 (×4): 1 [drp] via OPHTHALMIC
  Filled 2013-12-01: qty 5

## 2013-12-01 NOTE — Progress Notes (Addendum)
[facility] PROGRESS NOTE  Jacalyn LefevreVera M Earnshaw ZOX:[id number] DOB: 02-Apr-1933 DOA: 11/24/2013 PCP: Galvin ProfferHAGUE, IMRAN P, MD   Brief Narrative: 80 y.o. female with a history of hypertension, CAD, COPD, and ESRD on dialysis at the [facility], recently at [facility] 6/13-16 for pulmonary edema secondary to noncompliance with dialysis, who returned home from her treatment yesterday and had sudden onset of severe lower extremity pain, bilaterally from her pelvis to her knees, requiring transfer by EMS to [facility]. In the ED her CT showed only extensive atherosclerosis, and chest x-ray showed minimal perihilar infiltrates, unchanged from prior imaging, but she was found to be hypotensive with blood pressure as low as 60/15 and was transferred to [facility]. She was transferred to ICU and started on Levophed,given antibiotics for possible septic shock. She missed dialysis on Monday 6/22 secondary to temporary diarrhea, Both sets of blood cultures done today her have grown fully sensitive Enterococcus faecalis. One of 2 blood cultures here is also positive. Initially started on vancomycin and cefepime, cefepime discontinued 6/27. Non-vancomycin and gentamicin. 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 - not on medication at home- sugars normal -d/c sliding scale insulin  Hypothyroidism > TSH 6.38, improved from 2 weeks ago but still elevated - likely has sick euthyroid syndrome- no need to titrate medications yet  Encephalopathy Improved,  Consultants:  PC CM  Nephrology  Infectious disease  SIGNIFICANT EVENTS / STUDIES:  CT head 6/23 >> no acute abnormalities  CT abd/pelvis 6/23 >> renal atrophy, rectal wall thickening ? Significance, extensive athlerosclerosis  CXR 6/23 [facility]([place]) >> cardiomegaly, pacer, minimal perihilar infiltrates w little change from priors  TTE 6/25 >> LVH, mod-to-severe decrease LV fxn, severe TR and moderate MR, no clear-cut vegetations   LINES / TUBES:  L femoral CVC 6/23 >> out    CULTURES:  Blood 6/23 [facility]([place]) >> GPC's >> enterococcus >>  Blood 6/24 ([facility]) >> GPC chains >> enterococcus ss ampiillin   Antibiotics: Anti-infectives   Start     Dose/Rate Route Frequency Ordered Stop   12/01/13 1430  ampicillin (OMNIPEN) 2 g in sodium chloride 0.9 % 50 mL IVPB     2 g 150 mL/hr over 20 Minutes Intravenous Every 12 hours 12/01/13 1315     12/01/13 1200  gentamicin (GARAMYCIN) IVPB 60 mg     60 mg 100 mL/hr over 30 Minutes Intravenous Every M-W-F (Hemodialysis) 11/29/13 1250     12/01/13 1200  vancomycin (VANCOCIN) IVPB 750 mg/150 ml premix  Status:  Discontinued     750 mg 150 mL/hr over 60 Minutes Intravenous Every M-W-F (Hemodialysis) 12/01/13 0820 12/01/13 1315   11/29/13 1200  gentamicin (GARAMYCIN) 40 mg in dextrose 5 % 50 mL IVPB  Status:  Discontinued     40 mg 102 mL/hr over 30 Minutes Intravenous Every M-W-F (Hemodialysis) 11/27/13 1453 11/29/13 1250  11/27/13 1600  gentamicin (GARAMYCIN) IVPB 80 mg     80 mg 100 mL/hr over 30 Minutes Intravenous  Once 11/27/13 1452 11/27/13 1617   11/26/13 1800  ceFEPIme (MAXIPIME) 2 g in dextrose 5 % 50 mL IVPB  Status:   Discontinued     2 g 100 mL/hr over 30 Minutes Intravenous Every M-W-F (1800) 11/25/13 1154 11/27/13 1400   11/26/13 1200  vancomycin (VANCOCIN) 500 mg in sodium chloride 0.9 % 100 mL IVPB  Status:  Discontinued     500 mg 100 mL/hr over 60 Minutes Intravenous Every M-W-F (Hemodialysis) 11/25/13 1154 12/01/13 0820   11/24/13 0230  vancomycin (VANCOCIN) IVPB 1000 mg/200 mL premix     1,000 mg 200 mL/hr over 60 Minutes Intravenous  Once 11/24/13 0222 11/24/13 0419   11/24/13 0230  ceFEPIme (MAXIPIME) 2 g in dextrose 5 % 50 mL IVPB     2 g 100 mL/hr over 30 Minutes Intravenous  Once 11/24/13 0222 11/24/13 0349         Objective: Filed Vitals:   12/01/13 0930 12/01/13 1018 12/01/13 1106 12/01/13 1451  BP: 90/48 108/51 121/48   Pulse: 75 75    Temp:  98.3 F (36.8 C) 97.8 F (36.6 C)   TempSrc:  Oral Oral   Resp:  23 22   Height:      Weight:  47.6 kg (104 lb 15 oz)    SpO2:  100%  98%    Intake/Output Summary (Last 24 hours) at 12/01/13 1624 Last data filed at 12/01/13 1300  Gross per 24 hour  Intake    120 ml  Output   3301 ml  Net  -3181 ml    Exam:  General: She is upset about being in the hospital but otherwise in no distress  Skin: Scattered ecchymoses on her arms and legs. No splinter or conjunctival hemorrhages noted  Lungs: Clear  Cor: Regular S1 and S2. 2/6 systolic murmur heard at left lower sternal border  Chest wall: No abnormality noted around right anterior chest AICD  Abdomen: Soft and nontender  Joints and extremities: No acute abnormalities noted    Data Reviewed: Basic Metabolic Panel:  Recent Labs Lab 11/25/13 0434 11/26/13 0722 11/27/13 0430 11/29/13 0720 11/30/13 0523 12/01/13 0733  NA 136* 135* 139 136* 139 140  K 4.6 5.3 4.4 5.3 4.6 5.0  CL 99 97 99 95* 97 101  CO2 23 22 27 26 28 24   GLUCOSE 81 75 76 83 78 90  BUN 38* 48* 22 38* 21 30*  CREATININE 4.20* 5.01* 3.06* 5.07* 3.37* 4.75*  CALCIUM 8.8 10.1 10.1 9.5 8.7 8.8  MG 1.7   --  1.7  --   --   --   PHOS 4.8* 5.3* 3.0 4.4  --  4.2    Liver Function Tests:  Recent Labs Lab 11/26/13 0722 11/29/13 0720 12/01/13 0733  ALBUMIN 2.1* 2.1* 2.2*   No results found for this basename: LIPASE, AMYLASE,  in the last 168 hours No results found for this basename: AMMONIA,  in the last 168 hours  CBC:  Recent Labs Lab 11/25/13 0434 11/26/13 0722 11/29/13 0720 12/01/13 0733  WBC 8.3 7.7 5.4 6.7  HGB 8.3* 8.6* 8.9* 9.4*  HCT 26.4* 27.4* 28.5* 30.5*  MCV 101.5* 103.4* 104.4* 106.6*  PLT 127* 130* 174 204    Cardiac Enzymes: No results found for this basename: CKTOTAL, CKMB, CKMBINDEX, TROPONINI,  in the last 168 hours BNP (last 3 results)  No results found for this basename: PROBNP,  in the last 8760 hours   CBG:  Recent Labs Lab 11/30/13 0750 11/30/13 1147 11/30/13 1734 11/30/13 2107 12/01/13 1124  GLUCAP 81 101* 98 113* 74    Recent Results (from the past 240 hour(s))  MRSA PCR SCREENING     Status: None   Collection Time    11/24/13 12:53 AM      Result Value Ref Range Status   MRSA by PCR NEGATIVE  NEGATIVE Final   Comment:            The GeneXpert MRSA Assay (FDA     approved for NASAL specimens     only), is one component of a     comprehensive MRSA colonization     surveillance program. It is not     intended to diagnose MRSA     infection nor to guide or     monitor treatment for     MRSA infections.  CULTURE, BLOOD (ROUTINE X 2)     Status: None   Collection Time    11/24/13  2:00 AM      Result Value Ref Range Status   Specimen Description BLOOD RIGHT HAND   Final   Special Requests BOTTLES DRAWN AEROBIC ONLY 1CC   Final   Culture  Setup Time     Final   Value: 11/24/2013 08:19     Performed at [facility]   Culture     Final   Value: ENTEROCOCCUS SPECIES     Note: COMBINATION THERAPY OF HIGH DOSE AMPICILLIN OR VANCOMYCIN, PLUS AN AMINOGLYCOSIDE, IS USUALLY INDICATED FOR SERIOUS ENTEROCOCCAL INFECTIONS.     Note:  Gram Stain Report Called to,Read Back By and Verified With: SARA GROCE 11/26/13 0900 BY SMITHERSJ     Performed at [facility]   Report Status 11/28/2013 FINAL   Final   Organism ID, Bacteria ENTEROCOCCUS SPECIES   Final  CULTURE, BLOOD (ROUTINE X 2)     Status: None   Collection Time    11/24/13  2:31 AM      Result Value Ref Range Status   Specimen Description BLOOD RIGHT THUMB   Final   Special Requests BOTTLES DRAWN AEROBIC ONLY 3CC   Final   Culture  Setup Time     Final   Value: 11/24/2013 08:20     Performed at [facility]   Culture     Final   Value: NO GROWTH 5 DAYS     Performed at [facility]   Report Status 11/30/2013 FINAL   Final  CULTURE, BLOOD (ROUTINE X 2)     Status: None   Collection Time    11/27/13  4:30 PM      Result Value Ref Range Status   Specimen Description BLOOD RIGHT HAND   Final   Special Requests BOTTLES DRAWN AEROBIC ONLY 10 CC   Final   Culture  Setup Time     Final   Value: 11/28/2013 02:23     Performed at [facility]   Culture     Final   Value:        BLOOD CULTURE RECEIVED NO GROWTH TO DATE CULTURE WILL BE HELD FOR 5 DAYS BEFORE ISSUING A FINAL NEGATIVE REPORT     Performed at [facility]   Report Status PENDING   Incomplete  CULTURE, BLOOD (ROUTINE X 2)     Status: None   Collection Time  11/27/13  4:30 PM      Result Value Ref Range Status   Specimen Description BLOOD RIGHT ARM   Final   Special Requests BOTTLES DRAWN AEROBIC ONLY 9.5 CC   Final   Culture  Setup Time     Final   Value: 11/28/2013 02:23     Performed at [facility]   Culture     Final   Value:        BLOOD CULTURE RECEIVED NO GROWTH TO DATE CULTURE WILL BE HELD FOR 5 DAYS BEFORE ISSUING A FINAL NEGATIVE REPORT     Performed at [facility]   Report Status PENDING   Incomplete     Studies: Ir Pta Venous Left  11/16/2013   CLINICAL DATA:  80 year old female with end-stage renal disease on hemodialysis  via a left upper extremity brachiocephalic arteriovenous fistula. The fistula was created approximately 12 years ago. She has been using this access for the last 4- 5 years. She has been suffering with prolonged (45 min) bleeding times following dialysis concerning for stenosis of the draining or central veins.  EXAM: IR LEFT SHUNTOGRAM/FISTULAGRAM; PTA VENOUS  Date: 11/16/2013  PROCEDURE: 1. Diagnostic fistulogram 2. Angioplasty of tandem stenoses of the draining vein 3. Completion fistulogram Interventional Radiologist:  Sterling BigHeath K. McCullough, MD  ANESTHESIA/SEDATION: None required  FLUOROSCOPY TIME:  1 minute 24 seconds  CONTRAST:  50mL OMNIPAQUE IOHEXOL 300 MG/ML  SOLN  TECHNIQUE: Informed consent was obtained from the patient following explanation of the procedure, risks, benefits and alternatives. The patient understands, agrees and consents for the procedure. All questions were addressed. A time out was performed.  Maximal barrier sterile technique utilized including caps, mask, sterile gowns, sterile gloves, large sterile drape, hand hygiene, and Betadine skin prep.  The fistula was accessed percutaneously adjacent to the arterial anastomosis with an 18 gauge angiocatheter. A diagnostic fistulogram was performed. 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 Chest central to this in the mid arm and there is a segment of a peripherally calcified vein with tandem stenoses. The more peripheral stenosis is moderately high grade (60- 70%) the second stenosis is more web-like and approximately 50% diameter stenosis. The cephalic arch and central veins are widely patent. The arterial anastomosis is widely patent.  The risks, benefits and alternatives to angioplasty of the tandem stenotic venous segment were described to the patient. She understands and wishes to proceed. Therefore, a Bentson wire was advanced into the central veins. The angiocatheter was  exchanged over the wire for a working 6 [place] vascular sheath. A 6 x 40 mm Conquest balloon was positioned across the tandem stenoses and inflated to 20 atmospheres with full effacement of the balloon. The inflation was held for 2 min.  The balloon was deflated and removed. Follow-up fistulogram demonstrates near complete resolution of the tandem stenoses. The more severe stenosis demonstrates an approximate 10% residual narrowing. The more moderate stenosis demonstrates no residual narrowing. There is brisk flow.  The vascular sheath was removed and hemostasis was attained with the assistance of a 2-0 nylon pursestring suture. The patient tolerated the procedure very well.  IMPRESSION: 1. Diagnostic fistulogram demonstrates tandem stenoses in the mid aspect of the draining cephalic vein of the left upper extremity brachiocephalic arteriovenous fistula. 2. Angioplasty of both stenoses to 6 mm with excellent angiographic result. Signed,  Sterling BigHeath K. McCullough, MD  [facility]  [facility]   Electronically  Signed   By: Malachy Moan M.D.   On: 11/16/2013 17:07   Dg Chest Port 1 View  11/25/2013   CLINICAL DATA:  Hypoxemia  EXAM: PORTABLE CHEST - 1 VIEW  COMPARISON:  11/23/2013  FINDINGS: Diffuse mild bilateral interstitial thickening. No pleural effusion or pneumothorax. Enlargement of the central pulmonary vasculature. Stable cardiomegaly. Prior CABG. Dual lead AICD. Unremarkable osseous structures.  The osseous structures are unremarkable.  IMPRESSION: Mild pulmonary venous congestion.   Electronically Signed   By: Elige Ko   On: 11/25/2013 18:56   Dg Chest Port 1 View  11/14/2013   CLINICAL DATA:  Shortness of breath.  Cough.  EXAM: PORTABLE CHEST - 1 VIEW  COMPARISON:  Chest x-ray 11/13/2013.  FINDINGS: Cardiomegaly with pulmonary vascular prominence and interstitial prominence present consistent with congestive heart failure with pulmonary interstitial  edema. Pneumonitis cannot be excluded. Cardiac pacer with lead tips in right atrium and right ventricle. Prior CABG. No pneumothorax. No acute bony abnormality.  IMPRESSION: Congestive heart failure with pulmonary interstitial edema.   Electronically Signed   By: Maisie Fus  Register   On: 11/14/2013 14:47   Dg Abd Portable 2v  11/14/2013   CLINICAL DATA:  Abdominal pain.  Constipation .  EXAM: PORTABLE ABDOMEN - 2 VIEW  COMPARISON:  CT 06/29/2013  FINDINGS: Soft tissue structures are unremarkable. Moderate amount of stool noted: Marland Kitchen The gas pattern is nonspecific. Surgical clips right upper quadrant. Aortoiliac and visceral atherosclerotic vascular disease. Bilateral iliac stents are noted. Pelvic phleboliths. Degenerative changes lumbar spine and both hips. Cardiomegaly. Cardiac pacer.  IMPRESSION: 1. Moderate amount of stool in colon.  No bowel distention. 2. Aortoiliac atherosclerotic vascular disease. Bilateral iliac stents are present. Visceral atherosclerotic vascular disease. 3. Cholecystectomy. 4. Cardiomegaly.  Cardiac pacer.   Electronically Signed   By: Maisie Fus  Register   On: 11/14/2013 08:04   Ir Shuntogram/ Fistulagram Left Mod Sed  11/16/2013   CLINICAL DATA:  80 year old female with end-stage renal disease on hemodialysis via a left upper extremity brachiocephalic arteriovenous fistula. The fistula was created approximately 12 years ago. She has been using this access for the last 4- 5 years. She has been suffering with prolonged (45 min) bleeding times following dialysis concerning for stenosis of the draining or central veins.  EXAM: IR LEFT SHUNTOGRAM/FISTULAGRAM; PTA VENOUS  Date: 11/16/2013  PROCEDURE: 1. Diagnostic fistulogram 2. Angioplasty of tandem stenoses of the draining vein 3. Completion fistulogram Interventional Radiologist:  Sterling Big, MD  ANESTHESIA/SEDATION: None required  FLUOROSCOPY TIME:  1 minute 24 seconds  CONTRAST:  50mL OMNIPAQUE IOHEXOL 300 MG/ML  SOLN  TECHNIQUE:  Informed consent was obtained from the patient following explanation of the procedure, risks, benefits and alternatives. The patient understands, agrees and consents for the procedure. All questions were addressed. A time out was performed.  Maximal barrier sterile technique utilized including caps, mask, sterile gowns, sterile gloves, large sterile drape, hand hygiene, and Betadine skin prep.  The fistula was accessed percutaneously adjacent to the arterial anastomosis with an 18 gauge angiocatheter. A diagnostic fistulogram was performed. 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 Chest central to this in the mid arm and there is a segment of a peripherally calcified vein with tandem stenoses. The more peripheral stenosis is moderately high grade (60- 70%) the second stenosis is more web-like and approximately 50% diameter stenosis. The cephalic arch and central veins are widely patent. The arterial anastomosis is widely patent.  The risks, benefits and alternatives to angioplasty of the tandem stenotic venous segment were described to the patient. She understands and wishes to proceed. Therefore, a Bentson wire was advanced into the central veins. The angiocatheter was exchanged over the wire for a working 6 [place] vascular sheath. A 6 x 40 mm Conquest balloon was positioned across the tandem stenoses and inflated to 20 atmospheres with full effacement of the balloon. The inflation was held for 2 min.  The balloon was deflated and removed. Follow-up fistulogram demonstrates near complete resolution of the tandem stenoses. The more severe stenosis demonstrates an approximate 10% residual narrowing. The more moderate stenosis demonstrates no residual narrowing. There is brisk flow.  The vascular sheath was removed and hemostasis was attained with the assistance of a 2-0 nylon pursestring suture. The patient tolerated the procedure very well.   IMPRESSION: 1. Diagnostic fistulogram demonstrates tandem stenoses in the mid aspect of the draining cephalic vein of the left upper extremity brachiocephalic arteriovenous fistula. 2. Angioplasty of both stenoses to 6 mm with excellent angiographic result. Signed,  Sterling Big, MD  [facility]  [facility]   Electronically Signed   By: Malachy Moan M.D.   On: 11/16/2013 17:07    Scheduled Meds: . ampicillin (OMNIPEN) IV  2 g Intravenous Q12H  . antiseptic oral rinse  15 mL Mouth Rinse BID  . aspirin EC  81 mg Oral Daily  . atorvastatin  80 mg Oral Daily  . cinacalcet  60 mg Oral Q breakfast  . darbepoetin (ARANESP) injection - DIALYSIS  100 mcg Intravenous Q Mon-HD  . docusate sodium  200 mg Oral BID  . feeding supplement (RESOURCE BREEZE)  1 Container Oral BID BM  . ferric gluconate (FERRLECIT/NULECIT) IV  62.5 mg Intravenous Q Wed-HD  . folic acid  0.5 mg Oral Daily  . gentamicin  60 mg Intravenous Q M,W,F-HD  . heparin  5,000 Units Subcutaneous 3 times per day  . insulin aspart  0-5 Units Subcutaneous QHS  . insulin aspart  0-9 Units Subcutaneous TID WC  . ipratropium-albuterol  3 mL Nebulization Q6H  . levothyroxine  150 mcg Oral QAC breakfast  . pantoprazole  40 mg Oral Daily   Continuous Infusions: . sodium chloride Stopped (11/27/13 0746)       Time spent: 40 minutes   [facility]  [facility] Pager [email or web address], password [facility] 12/01/2013, 4:24 PM  LOS: 7 days

## 2013-12-01 NOTE — Progress Notes (Signed)
[facility] Progress Note  Subjective:   No complaints  Objective Filed Vitals:   12/01/13 0900 12/01/13 0930 12/01/13 1018 12/01/13 1106  BP: 104/55 90/48 108/51 121/48  Pulse: 74 75 75   Temp:   98.3 F (36.8 C) 97.8 F (36.6 C)  TempSrc:   Oral Oral  Resp:   23 22  Height:      Weight:   47.6 kg (104 lb 15 oz)   SpO2:   100%    Physical Exam General: Alert, cooperative, NAD Heart: RRR Lungs: Diminished at bases. No wheezes/rales Abdomen: Soft, non-tender, +BS Extremities: Weeping clear drainage from RUE. No LE edema Dialysis Access: L AVF, + bruit Neuro: alert, nf, ox 3  Dialysis Orders: MWF @ [facility]  3.5h   44.5kg   2K/2Ca    350/A1.5 P2  Heparin- none  AVF @ [facility]  Aranesp 40 mcg & Venofer 50 mg on Wed   Assessment: 1. Enterococcal bacteremia / severe TR- presumed TV endocarditis, and concern for possible AICD device infection; Cardiology to see. On IV AB per ID  2. ESRD on HD 3. Hypotension/volume - good UF today , 3kg off, still vol excess 4. Anemia - Hgb 8.9 on Aranesp 100 q Mon. Continue Weekly Fe 5. HPTH- ^Ca resolving. Cont low Ca bath, sensipar. Holding phoslo and vit D, consider change to non-Ca binder at dc 6. Nutrition - Alb 2.1, renal carb-mod diet, vitamin. 7. DM - insulin per primary  Plan- next HD Friday  Vinson Moselleob Tiare Rohlman MD (pgr) [phone]    ([phone]) [phone] 12/01/2013, 2:31 PM        Additional Objective Labs: Basic Metabolic Panel:  Recent Labs Lab 11/27/13 0430 11/29/13 0720 11/30/13 0523 12/01/13 0733  NA 139 136* 139 140  K 4.4 5.3 4.6 5.0  CL 99 95* 97 101  CO2 27 26 28 24   GLUCOSE 76 83 78 90  BUN 22 38* 21 30*  CREATININE 3.06* 5.07* 3.37* 4.75*  CALCIUM 10.1 9.5 8.7 8.8  PHOS 3.0 4.4  --  4.2   Liver Function Tests:  Recent Labs Lab 11/26/13 0722 11/29/13 0720 12/01/13 0733  ALBUMIN 2.1* 2.1* 2.2*   CBC:  Recent Labs Lab 11/25/13 0434 11/26/13 0722 11/29/13 0720 12/01/13 0733  WBC 8.3 7.7 5.4  6.7  HGB 8.3* 8.6* 8.9* 9.4*  HCT 26.4* 27.4* 28.5* 30.5*  MCV 101.5* 103.4* 104.4* 106.6*  PLT 127* 130* 174 204   Blood Culture    Component Value Date/Time   SDES BLOOD RIGHT HAND 11/27/2013 1630   SDES BLOOD RIGHT ARM 11/27/2013 1630   SPECREQUEST BOTTLES DRAWN AEROBIC ONLY 10 CC 11/27/2013 1630   SPECREQUEST BOTTLES DRAWN AEROBIC ONLY 9.5 CC 11/27/2013 1630   CULT  Value:        BLOOD CULTURE RECEIVED NO GROWTH TO DATE CULTURE WILL BE HELD FOR 5 DAYS BEFORE ISSUING A FINAL NEGATIVE REPORT [facility]formed at [facility] 11/27/2013 1630   CULT  Value:        BLOOD CULTURE RECEIVED NO GROWTH TO DATE CULTURE WILL BE HELD FOR 5 DAYS BEFORE ISSUING A FINAL NEGATIVE REPORT Performed at [facility] 11/27/2013 1630   REPTSTATUS PENDING 11/27/2013 1630   REPTSTATUS PENDING 11/27/2013 1630    Cardiac Enzymes: No results found for this basename: CKTOTAL, CKMB, CKMBINDEX, TROPONINI,  in the last 168 hours CBG:  Recent Labs Lab 11/30/13 0750 11/30/13 1147 11/30/13 1734 11/30/13 2107 12/01/13 1124  GLUCAP 81 101* 98 113* 74  Studies/Results: No results found. Medications: . sodium chloride Stopped (11/27/13 0746)   . ampicillin (OMNIPEN) IV  2 g Intravenous Q12H  . antiseptic oral rinse  15 mL Mouth Rinse BID  . aspirin EC  81 mg Oral Daily  . atorvastatin  80 mg Oral Daily  . cinacalcet  60 mg Oral Q breakfast  . darbepoetin (ARANESP) injection - DIALYSIS  100 mcg Intravenous Q Mon-HD  . docusate sodium  200 mg Oral BID  . feeding supplement (RESOURCE BREEZE)  1 Container Oral BID BM  . ferric gluconate (FERRLECIT/NULECIT) IV  62.5 mg Intravenous Q Wed-HD  . folic acid  0.5 mg Oral Daily  . gentamicin  60 mg Intravenous Q M,W,F-HD  . heparin  5,000 Units Subcutaneous 3 times per day  . insulin aspart  0-5 Units Subcutaneous QHS  . insulin aspart  0-9 Units Subcutaneous TID WC  . ipratropium-albuterol  3 mL Nebulization Q6H  . levothyroxine  150 mcg Oral QAC breakfast   . pantoprazole  40 mg Oral Daily

## 2013-12-01 NOTE — Progress Notes (Signed)
ANTIBIOTIC CONSULT NOTE - FOLLOW UP  Pharmacy Consult for vancomycin and gentamicin Indication: Bacteremia/endocarditis   Allergies  Allergen Reactions  . Ivp Dye [Iodinated Diagnostic Agents] Swelling  . Lisinopril Swelling  . Iohexol Itching and Swelling         Patient Measurements: Height: 5\' 3"  (160 cm) Weight: 111 lb 9.6 oz (50.621 kg) IBW/kg (Calculated) : 52.4  Vital Signs: Temp: 98 F (36.7 C) (07/01 0453) Temp src: Oral (07/01 0453) BP: 143/68 mmHg (07/01 0714) Pulse Rate: 68 (07/01 0714) Intake/Output from previous day: 06/30 0701 - 07/01 0700 In: 240 [P.O.:240] Out: 300 [Urine:300] Intake/Output from this shift:    Labs:  Recent Labs  11/29/13 0720 11/30/13 0523  WBC 5.4  --   HGB 8.9*  --   PLT 174  --   CREATININE 5.07* 3.37*   Estimated Creatinine Clearance: 10.8 ml/min (by C-G formula based on Cr of 3.37). No results found for this basename: VANCOTROUGH, Leodis Binet, VANCORANDOM, GENTTROUGH, GENTPEAK, GENTRANDOM, TOBRATROUGH, TOBRAPEAK, TOBRARND, AMIKACINPEAK, AMIKACINTROU, AMIKACIN,  in the last 72 hours   Microbiology: Recent Results (from the past 720 hour(s))  MRSA PCR SCREENING     Status: None   Collection Time    11/24/13 12:53 AM      Result Value Ref Range Status   MRSA by PCR NEGATIVE  NEGATIVE Final   Comment:            The GeneXpert MRSA Assay (FDA     approved for NASAL specimens     only), is one component of a     comprehensive MRSA colonization     surveillance program. It is not     intended to diagnose MRSA     infection nor to guide or     monitor treatment for     MRSA infections.  CULTURE, BLOOD (ROUTINE X 2)     Status: None   Collection Time    11/24/13  2:00 AM      Result Value Ref Range Status   Specimen Description BLOOD RIGHT HAND   Final   Special Requests BOTTLES DRAWN AEROBIC ONLY 1CC   Final   Culture  Setup Time     Final   Value: 11/24/2013 08:19     Performed at [facility]   Culture      Final   Value: ENTEROCOCCUS SPECIES     Note: COMBINATION THERAPY OF HIGH DOSE AMPICILLIN OR VANCOMYCIN, PLUS AN AMINOGLYCOSIDE, IS USUALLY INDICATED FOR SERIOUS ENTEROCOCCAL INFECTIONS.     Note: Gram Stain Report Called to,Read Back By and Verified With: SARA GROCE 11/26/13 0900 BY [email or web address]     Performed at [facility]   Report Status 11/28/2013 FINAL   Final   Organism ID, Bacteria ENTEROCOCCUS SPECIES   Final  CULTURE, BLOOD (ROUTINE X 2)     Status: None   Collection Time    11/24/13  2:31 AM      Result Value Ref Range Status   Specimen Description BLOOD RIGHT THUMB   Final   Special Requests BOTTLES DRAWN AEROBIC ONLY 3CC   Final   Culture  Setup Time     Final   Value: 11/24/2013 08:20     Performed at [facility]   Culture     Final   Value: NO GROWTH 5 DAYS     Performed at [facility]   Report Status 11/30/2013 FINAL   Final  CULTURE, BLOOD (ROUTINE X 2)  Status: None   Collection Time    11/27/13  4:30 PM      Result Value Ref Range Status   Specimen Description BLOOD RIGHT HAND   Final   Special Requests BOTTLES DRAWN AEROBIC ONLY 10 CC   Final   Culture  Setup Time     Final   Value: 11/28/2013 02:23     [facility]   Culture     Final   Value:        BLOOD CULTURE RECEIVED NO GROWTH TO DATE CULTURE WILL BE HELD FOR 5 DAYS BEFORE ISSUING A FINAL NEGATIVE REPORT     [facility]   Report Status PENDING   Incomplete  CULTURE, BLOOD (ROUTINE X 2)     Status: None   Collection Time    11/27/13  4:30 PM      Result Value Ref Range Status   Specimen Description BLOOD RIGHT ARM   Final   Special Requests BOTTLES DRAWN AEROBIC ONLY 9.5 CC   Final   Culture  Setup Time     Final   Value: 11/28/2013 02:23     [facility]   Culture     Final   Value:        BLOOD CULTURE RECEIVED NO GROWTH TO DATE CULTURE WILL BE HELD FOR 5 DAYS BEFORE ISSUING A FINAL NEGATIVE REPORT      [facility]   Report Status PENDING   Incomplete    Anti-infectives   Start     Dose/Rate Route Frequency Ordered Stop   12/01/13 1200  gentamicin (GARAMYCIN) IVPB 60 mg     60 mg 100 mL/hr over 30 Minutes Intravenous Every M-W-F (Hemodialysis) 11/29/13 1250     11/29/13 1200  gentamicin (GARAMYCIN) 40 mg in dextrose 5 % 50 mL IVPB  Status:  Discontinued     40 mg 102 mL/hr over 30 Minutes Intravenous Every M-W-F (Hemodialysis) 11/27/13 1453 11/29/13 1250   11/27/13 1600  gentamicin (GARAMYCIN) IVPB 80 mg     80 mg 100 mL/hr over 30 Minutes Intravenous  Once 11/27/13 1452 11/27/13 1617   11/26/13 1800  ceFEPIme (MAXIPIME) 2 g in dextrose 5 % 50 mL IVPB  Status:  Discontinued     2 g 100 mL/hr over 30 Minutes Intravenous Every M-W-F (1800) 11/25/13 1154 11/27/13 1400   11/26/13 1200  vancomycin (VANCOCIN) 500 mg in sodium chloride 0.9 % 100 mL IVPB     500 mg 100 mL/hr over 60 Minutes Intravenous Every M-W-F (Hemodialysis) 11/25/13 1154     11/24/13 0230  vancomycin (VANCOCIN) IVPB 1000 mg/200 mL premix     1,000 mg 200 mL/hr over 60 Minutes Intravenous  Once 11/24/13 0222 11/24/13 0419   11/24/13 0230  ceFEPIme (MAXIPIME) 2 g in dextrose 5 % 50 mL IVPB     2 g 100 mL/hr over 30 Minutes Intravenous  Once 11/24/13 0222 11/24/13 0349      Assessment: Patient is a 80 y.o F on vancomycin and gentamicin (for synergy) for enterococcus bacteremia/endocarditis.  Patient's currently in HD.  Pre-HD vancomycin level drawn this morning now back sub-therapeutic at 9.6.   Vanc 6/24 >> Cefepime 6/24 >>6/27 Synergistic gen 6/27>>  6/27 BCx x 2 - ngtd 6/24 BC >>1/2 enterococcus (MIC to Vanc is 1) [place] cultures - Blood >> vanc sens enterococcus   Goal of Therapy:  Pre-HD vanc level= 15-25  Plan:  1) increase vancomycin dose to 750mg  IV qHD 2) cont gentamicin 60mg  qHD  Ahuva Poynor P 12/01/2013,8:01 AM

## 2013-12-01 NOTE — Progress Notes (Signed)
Attempted to have patient sign consent form for TEE. Patient reports that she does not have adequate information to sign consent at this time. Will report off to oncoming shift. Gilman Schmidtembrina, Benzion Mesta J

## 2013-12-01 NOTE — Progress Notes (Signed)
PT Cancellation Note  Patient Details Name: [name] MRN: [id number] DOB: 02/19/1933   Cancelled Treatment:    Reason Eval/Treat Not Completed: Other (comment) Was in HD earlier today, and fatigued in the afternoon  Will follow-up tomorrow  Van ClinesHolly Pauleen Goleman, PT  Acute Rehabilitation Services Pager [phone] Office [phone]    Van ClinesGarrigan, Marijean Montanye [facility] 12/01/2013, 4:01 PM

## 2013-12-01 NOTE — Progress Notes (Signed)
[facility]  Day # 9 vancomycin  Day 5 gentamicin     Subjective: No new complaints   Antibiotics:  Anti-infectives   Start     Dose/Rate Route Frequency Ordered Stop   12/01/13 1430  ampicillin (OMNIPEN) 2 g in sodium chloride 0.9 % 50 mL IVPB     2 g 150 mL/hr over 20 Minutes Intravenous Every 12 hours 12/01/13 1315     12/01/13 1200  gentamicin (GARAMYCIN) IVPB 60 mg     60 mg 100 mL/hr over 30 Minutes Intravenous Every M-W-F (Hemodialysis) 11/29/13 1250     12/01/13 1200  vancomycin (VANCOCIN) IVPB 750 mg/150 ml premix  Status:  Discontinued     750 mg 150 mL/hr over 60 Minutes Intravenous Every M-W-F (Hemodialysis) 12/01/13 0820 12/01/13 1315   11/29/13 1200  gentamicin (GARAMYCIN) 40 mg in dextrose 5 % 50 mL IVPB  Status:  Discontinued     40 mg 102 mL/hr over 30 Minutes Intravenous Every M-W-F (Hemodialysis) 11/27/13 1453 11/29/13 1250   11/27/13 1600  gentamicin (GARAMYCIN) IVPB 80 mg     80 mg 100 mL/hr over 30 Minutes Intravenous  Once 11/27/13 1452 11/27/13 1617   11/26/13 1800  ceFEPIme (MAXIPIME) 2 g in dextrose 5 % 50 mL IVPB  Status:  Discontinued     2 g 100 mL/hr over 30 Minutes Intravenous Every M-W-F (1800) 11/25/13 1154 11/27/13 1400   11/26/13 1200  vancomycin (VANCOCIN) 500 mg in sodium chloride 0.9 % 100 mL IVPB  Status:  Discontinued     500 mg 100 mL/hr over 60 Minutes Intravenous Every M-W-F (Hemodialysis) 11/25/13 1154 12/01/13 0820   11/24/13 0230  vancomycin (VANCOCIN) IVPB 1000 mg/200 mL premix     1,000 mg 200 mL/hr over 60 Minutes Intravenous  Once 11/24/13 0222 11/24/13 0419   11/24/13 0230  ceFEPIme (MAXIPIME) 2 g in dextrose 5 % 50 mL IVPB     2 g 100 mL/hr over 30 Minutes Intravenous  Once 11/24/13 0222 11/24/13 0349      Medications: Scheduled Meds: . ampicillin (OMNIPEN) IV  2 g Intravenous Q12H  . antiseptic oral rinse  15 mL Mouth Rinse BID  . aspirin EC  81 mg Oral Daily  . atorvastatin  80 mg  Oral Daily  . cinacalcet  60 mg Oral Q breakfast  . darbepoetin (ARANESP) injection - DIALYSIS  100 mcg Intravenous Q Mon-HD  . docusate sodium  200 mg Oral BID  . feeding supplement (RESOURCE BREEZE)  1 Container Oral BID BM  . ferric gluconate (FERRLECIT/NULECIT) IV  62.5 mg Intravenous Q Wed-HD  . folic acid  0.5 mg Oral Daily  . gentamicin  60 mg Intravenous Q M,W,F-HD  . heparin  5,000 Units Subcutaneous 3 times per day  . insulin aspart  0-5 Units Subcutaneous QHS  . insulin aspart  0-9 Units Subcutaneous TID WC  . ipratropium-albuterol  3 mL Nebulization Q6H  . levothyroxine  150 mcg Oral QAC breakfast  . pantoprazole  40 mg Oral Daily   Continuous Infusions: . sodium chloride Stopped (11/27/13 0746)   PRN Meds:.sodium chloride, acetaminophen, albuterol, ALPRAZolam, dextromethorphan, ibuprofen, nitroGLYCERIN, ondansetron (ZOFRAN) IV, oxyCODONE-acetaminophen, pramoxine-mineral oil-zinc, sorbitol    Objective: Weight change: -4 lb 2.3 oz (-1.878 kg)  Intake/Output Summary (Last 24 hours) at 12/01/13 1510 Last data filed at 12/01/13 1300  Gross per 24 hour  Intake    120 ml  Output   3301 ml  Net  -  3181 ml   Blood pressure 121/48, pulse 75, temperature 97.8 F (36.6 C), temperature source Oral, resp. rate 22, height 5\' 3"  (1.6 m), weight 104 lb 15 oz (47.6 kg), SpO2 98.00%. Temp:  [97.8 F (36.6 C)-98.3 F (36.8 C)] 97.8 F (36.6 C) (07/01 1106) Pulse Rate:  [68-81] 75 (07/01 1018) Resp:  [17-26] 22 (07/01 1106) BP: (90-143)/(48-68) 121/48 mmHg (07/01 1106) SpO2:  [97 %-100 %] 98 % (07/01 1451) Weight:  [104 lb 15 oz (47.6 kg)-111 lb 9.6 oz (50.621 kg)] 104 lb 15 oz (47.6 kg) (07/01 1018)  Physical Exam: General: She alert and oriented, appears stated age, wearing nasal cannula  Lungs: Clear  Cor: Regular S1 and S2. 2/6 systolic murmur heard at left lower sternal border  Chest wall: No abnormality noted around right anterior chest AICD slight echymosis superior to  it, no warmth or fluctuance.  Abdomen: Soft and nontender  Skin: Scattered ecchymoses on her arms and legs. No splinter or conjunctival hemorrhages noted,    CBC:  Recent Labs Lab 11/25/13 0434 11/26/13 0722 11/27/13 0430 11/29/13 0720 12/01/13 0733  HGB 8.3* 8.6*  --  8.9* 9.4*  HCT 26.4* 27.4*  --  28.5* 30.5*  PLT 127* 130*  --  174 204  INR  --   --  1.03  --   --      BMET  Recent Labs  11/30/13 0523 12/01/13 0733  NA 139 140  K 4.6 5.0  CL 97 101  CO2 28 24  GLUCOSE 78 90  BUN 21 30*  CREATININE 3.37* 4.75*  CALCIUM 8.7 8.8     Liver Panel   Recent Labs  11/29/13 0720 12/01/13 0733  ALBUMIN 2.1* 2.2*       Sedimentation Rate No results found for this basename: ESRSEDRATE,  in the last 72 hours C-Reactive Protein No results found for this basename: CRP,  in the last 72 hours  Micro Results: Recent Results (from the past 240 hour(s))  MRSA PCR SCREENING     Status: None   Collection Time    11/24/13 12:53 AM      Result Value Ref Range Status   MRSA by PCR NEGATIVE  NEGATIVE Final   Comment:            The GeneXpert MRSA Assay (FDA     approved for NASAL specimens     only), is one component of a     comprehensive MRSA colonization     surveillance program. It is not     intended to diagnose MRSA     infection nor to guide or     monitor treatment for     MRSA infections.  CULTURE, BLOOD (ROUTINE X 2)     Status: None   Collection Time    11/24/13  2:00 AM      Result Value Ref Range Status   Specimen Description BLOOD RIGHT HAND   Final   Special Requests BOTTLES DRAWN AEROBIC ONLY 1CC   Final   Culture  Setup Time     Final   Value: 11/24/2013 08:19     Performed [facility]   Culture     Final   Value: ENTEROCOCCUS SPECIES     Note: COMBINATION THERAPY OF HIGH DOSE AMPICILLIN OR VANCOMYCIN, PLUS AN AMINOGLYCOSIDE, IS USUALLY INDICATED FOR SERIOUS ENTEROCOCCAL INFECTIONS.     Note: Gram Stain Report Called to,Read  Back By and Verified With: SARA GROCE 11/26/13 0900 BY  [email or web address]     [facility]formed at [facility]   Report Status 11/28/2013 FINAL   Final   Organism ID, Bacteria ENTEROCOCCUS SPECIES   Final  CULTURE, BLOOD (ROUTINE X 2)     Status: None   Collection Time    11/24/13  2:31 AM      Result Value Ref Range Status   Specimen Description BLOOD RIGHT THUMB   Final   Special Requests BOTTLES DRAWN AEROBIC ONLY 3CC   Final   Culture  Setup Time     Final   Value: 11/24/2013 08:20     [facility]   Culture     Final   Value: NO GROWTH 5 DAYS     [facility] at [facility]   Report Status 11/30/2013 FINAL   Final  CULTURE, BLOOD (ROUTINE X 2)     Status: None   Collection Time    11/27/13  4:30 PM      Result Value Ref Range Status   Specimen Description BLOOD RIGHT HAND   Final   Special Requests BOTTLES DRAWN AEROBIC ONLY 10 CC   Final   Culture  Setup Time     Final   Value: 11/28/2013 02:23     [facility]formed [facility]   Culture     Final   Value:        BLOOD CULTURE RECEIVED NO GROWTH TO DATE CULTURE WILL BE HELD FOR 5 DAYS BEFORE ISSUING A FINAL NEGATIVE REPORT     [facility]formed at [facility]   Report Status PENDING   Incomplete  CULTURE, BLOOD (ROUTINE X 2)     Status: None   Collection Time    11/27/13  4:30 PM      Result Value Ref Range Status   Specimen Description BLOOD RIGHT ARM   Final   Special Requests BOTTLES DRAWN AEROBIC ONLY 9.5 CC   Final   Culture  Setup Time     Final   Value: 11/28/2013 02:23     [facility]formed at [facility]   Culture     Final   Value:        BLOOD CULTURE RECEIVED NO GROWTH TO DATE CULTURE WILL BE HELD FOR 5 DAYS BEFORE ISSUING A FINAL NEGATIVE REPORT     [facility]formed at [facility]   Report Status PENDING   Incomplete    Studies/Results: No results found.    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 female with  ampicillin sensitive enterococcal bacteremia and ICD.  #1 certainly enterococcal bacteremia in the setting of ICD equals by definition ICD infection.  Case discussed with Dr. Ladona Ridgelaylor he will be seeing the patient. Plan is for the patient undergo transesophageal echocardiogram to elucidate pathology on her leads and/or valves  After this is been done ICD will be removed  I am changing her to more bactericidal regmimen of AMP and Gent, though she may need more fluid removed with HD in this case AND she will ultimately require a  central line to administer her BID AMP  I would plan on giving her FOUR weeks of DUAL AMP and GENT, and IF pOSSIBLE delay new ICD implantation until AFter she has completed her therapy  #2 Screening: will check HIV and rest of Hep panel  Dr Claudie Fisherman is covering for 4th of July weekend starting tomorrow.    LOS: 7 days   Acey Lav 12/01/2013, 3:10 PM

## 2013-12-01 NOTE — Consult Note (Signed)
ELECTROPHYSIOLOGY CONSULT NOTE    Patient ID: [name] MRN: [id number], DOB/AGE: [birth year] 80 y.o.  Admit date: 11/24/2013 Date of Consult: 12-01-13  Primary Physician: Galvin Proffer, MD Primary Cardiologist: [facility] in [facility] - seen in [place]  Reason for Consultation: bacteremia/evalaute for device extraction  HPI:  [name] is a 80 y.o. female with a past medical history significant for hypertension, atrial fibrillation, hyperlipidemia, diabetes, PAD, PVD, ESRD on dialysis, CAD (s/p PCI to LAD 2001, CABG 2008, last cath 2012 demonstrated patent grafts EF 45%) and heart block status post pacemaker implantation.  Her pacemaker was originally implanted in 2002 with generator change 2012.  She presented on the day of admission with lower extremity pain following dialysis.  She was found to be hypotensive and subsequent work up has demonstrated enterococcus bacteremia.  ID has recommended device extraction.  EP has been asked to evaluate.  Echocardiogram 11-25-13 demonstrated EF 30-35%, akinesis of basalinferospetal myocardium, hypokinesis of entire apical myocardium, moderate MR, severe TR, PA pressure 68.  TEE has not yet been done.  Vegetation unable to be excluded from TTE.    She has atrial fibrillation by device interrogation today but is not on anti-coagulation. She has underlying CHB. She reports that this was discontinued by her primary cardiologist due to concerns about bleeding.   She currently feels like she is improving.  She denies chest pain.  Her shortness of breath is stable.  No fevers or chills.  ROS is otherwise negative.   Past Medical History  Diagnosis Date  . Carotid artery occlusion     Bilateral carotid artery disease  . Peripheral artery disease   . Arrhythmia     ? hx of atrial fibrillation  . CHF (congestive heart failure)     EF 25-30%.   . Hypertension   . Hyperlipidemia   . ESRD on hemodialysis 06/20/11    M, W, F; [address]  [place].  Started HD in Sept 2008  . AAA (abdominal aortic aneurysm)   . Stroke ` 2007  . Duodenal ulcer 2008  . S/P CABG (coronary artery bypass graft)     CABG in 2008, had 3 MI's prior  . Depression   . Pneumonia   . COPD, severe   . Hypothyroidism   . Anemia   . Pacemaker      Surgical History:  Past Surgical History  Procedure Laterality Date  . Thyroid surgery  2004  . Dg av dialysis  shunt access exist*l* or  03/2000    left upper arm  . Vaginal hysterectomy  1976  . Hemorrhoid surgery  1971  . Insert / replace / remove pacemaker  1990's    initial placement  . Insert / replace / remove pacemaker  05/2011    "changed out"  . Incisional hernia repair  10/2010    incarcerated  . Common iliac  06/2009    bilateral kissing stents  . Pseudoaneurysm repair  09/1999    right groin  . Av fistula repair  04/2007    left upper arm  . Cardiac catheterization  02/10, 03/12    Most recent showed 3 vessel CAD with patent grafts: SVG to LAD, SVG to PDA and SVG to OM  . Coronary angioplasty with stent placement  09/1999    "1"  . Coronary artery bypass graft  02/2007    CABG X3     Prescriptions prior to admission  Medication Sig Dispense Refill  .  ALPRAZolam (XANAX) 0.5 MG tablet Take 0.5 mg by mouth 3 (three) times daily as needed for anxiety.      Marland Kitchen aspirin EC 81 MG tablet Take 81 mg by mouth daily.      Marland Kitchen atorvastatin (LIPITOR) 80 MG tablet Take 80 mg by mouth daily.      . calcium acetate (PHOSLO) 667 MG capsule Take 3,335 mg by mouth 3 (three) times daily with meals.       . cholecalciferol (VITAMIN D) 1000 UNITS tablet Take 1,000 Units by mouth daily.      . cinacalcet (SENSIPAR) 60 MG tablet Take 60 mg by mouth daily.      Marland Kitchen dextromethorphan (DELSYM) 30 MG/5ML liquid Take 30 mg by mouth every 12 (twelve) hours as needed for cough.       . docusate sodium (COLACE) 100 MG capsule Take 100 mg by mouth daily.       . feeding supplement, RESOURCE BREEZE, (RESOURCE BREEZE)  LIQD Take 1 Container by mouth 2 (two) times daily between meals.  60 Container  1  . Fiber, Guar Gum, CHEW Chew 1 each by mouth daily.      . folic acid (FOLVITE) 400 MCG tablet Take 400 mcg by mouth daily.      . furosemide (LASIX) 80 MG tablet Take 80 mg by mouth 2 (two) times daily.       . hydrALAZINE (APRESOLINE) 25 MG tablet Take 25 mg by mouth 3 (three) times daily.      Marland Kitchen ipratropium (ATROVENT) 0.02 % nebulizer solution Take 0.5 mg by nebulization 2 (two) times daily as needed for wheezing or shortness of breath.      . levothyroxine (SYNTHROID, LEVOTHROID) 150 MCG tablet Take 150 mcg by mouth daily before breakfast.      . nitroGLYCERIN (NITROSTAT) 0.4 MG SL tablet Place 0.4 mg under the tongue every 5 (five) minutes as needed for chest pain.       Marland Kitchen olopatadine (PATANOL) 0.1 % ophthalmic solution Place 1 drop into both eyes 2 (two) times daily. Affected eye      . oxyCODONE-acetaminophen (PERCOCET) 7.5-325 MG per tablet Take 1 tablet by mouth every 8 (eight) hours as needed for pain.       . pramoxine-mineral oil-zinc (TUCKS) 1-12.5 % rectal ointment Place rectally 2 (two) times daily as needed for itching or hemorrhoids.  30 g  0  . promethazine (PHENERGAN) 25 MG tablet Take 25 mg by mouth every 6 (six) hours as needed for nausea or vomiting.       . temazepam (RESTORIL) 30 MG capsule Take 30 mg by mouth at bedtime as needed for sleep.       Marland Kitchen tiotropium (SPIRIVA) 18 MCG inhalation capsule Place 1 capsule (18 mcg total) into inhaler and inhale daily.  30 capsule  0    Inpatient Medications:  . antiseptic oral rinse  15 mL Mouth Rinse BID  . aspirin EC  81 mg Oral Daily  . atorvastatin  80 mg Oral Daily  . cinacalcet  60 mg Oral Q breakfast  . darbepoetin (ARANESP) injection - DIALYSIS  100 mcg Intravenous Q Mon-HD  . docusate sodium  200 mg Oral BID  . feeding supplement (RESOURCE BREEZE)  1 Container Oral BID BM  . ferric gluconate (FERRLECIT/NULECIT) IV  62.5 mg Intravenous Q  Wed-HD  . folic acid  0.5 mg Oral Daily  . gentamicin  60 mg Intravenous Q M,W,F-HD  . heparin  5,000 Units  Subcutaneous 3 times per day  . insulin aspart  0-5 Units Subcutaneous QHS  . insulin aspart  0-9 Units Subcutaneous TID WC  . ipratropium-albuterol  3 mL Nebulization Q6H  . levothyroxine  150 mcg Oral QAC breakfast  . pantoprazole  40 mg Oral Daily  . vancomycin  750 mg Intravenous Q M,W,F-HD    Allergies:  Allergies  Allergen Reactions  . Ivp Dye [Iodinated Diagnostic Agents] Swelling  . Lisinopril Swelling  . Iohexol Itching and Swelling         History   Social History  . Marital Status: Widowed    Spouse Name: N/A    Number of Children: N/A  . Years of Education: N/A   Occupational History  . Not on file.   Social History Main Topics  . Smoking status: Current Every Day Smoker -- 0.50 packs/day for 40 years    Types: Cigarettes  . Smokeless tobacco: Never Used  . Alcohol Use: No  . Drug Use: No  . Sexual Activity: No   Other Topics Concern  . Not on file   Social History Narrative  . No narrative on file     Family History - diabetes, hypertension.  Denies premature CAD  Physical Exam  BP 101/55  Pulse 75  Temp(Src) 98 F (36.7 C) (Oral)  Resp 26  Ht 5\' 3"  (1.6 m)  Wt 111 lb 9.6 oz (50.621 kg)  BMI 19.77 kg/m2  SpO2 100%  Chronically ill appearing NAD HEENT: Unremarkable,[place], AT Neck:  8 JVD, no thyromegally Back:  No CVA tenderness Lungs:  Scattered basilar rales with no wheezes or rhonchi HEART:  Regular rate rhythm, 1/6 systolic murmur, no rubs, no clicks Abd:  soft, positive bowel sounds, no organomegally, no rebound, no guarding Ext:  2 plus pulses, trace peripheral edema, no cyanosis, no clubbing left arm fistula Skin:  No rashes no nodules Neuro:  CN II through XII intact, motor grossly intact  Labs:   Lab Results  Component Value Date   WBC 5.4 11/29/2013   HGB 8.9* 11/29/2013   HCT 28.5* 11/29/2013   MCV 104.4* 11/29/2013    PLT 174 11/29/2013    Recent Labs Lab 11/30/13 0523  NA 139  K 4.6  CL 97  CO2 28  BUN 21  CREATININE 3.37*  CALCIUM 8.7  GLUCOSE 78    Radiology/Studies: Dg Chest Port 1 View 11/25/2013   CLINICAL DATA:  Hypoxemia  EXAM: PORTABLE CHEST - 1 VIEW  COMPARISON:  11/23/2013  FINDINGS: Diffuse mild bilateral interstitial thickening. No pleural effusion or pneumothorax. Enlargement of the central pulmonary vasculature. Stable cardiomegaly. Prior CABG. Dual lead AICD. Unremarkable osseous structures.  The osseous structures are unremarkable.  IMPRESSION: Mild pulmonary venous congestion.   Electronically Signed   By: Elige KoHetal  Patel   On: 11/25/2013 18:56   AVW:[id number]:atrial fibrillation with V pacing  TELEMETRY: atrial fibrillation with V pacing  DEVICE HISTORY: MDT dual chamber pacemaker implanted 2002 for symptomatic bradycardia/heart block.  Gen change 2012.  Right sided device.  Biotronik leads. PPM dependent with no escape rhythm.  A/P 1. Enterococcal bacteremia 2. Indwelling PPM, pocket benign 3. Chronic atrial fib, not a coumadin candidate 4. Complete heart block with no ventricular escape 5. ICM, EF 30% 6. ESRD on HD Rec: A very difficult situation. She feels much better with antibiotic therapy. The patient has 80 year old right sided PPM leads and no escape rhythm. In addition, she has no good remaining access options for PPM  re-implantation. Her TEE does not demonstrate endocarditis on her PM leads. Considering all aspects of her care, I think chronic long term suppressive anti-biotics would be her least bad option. For her, PM re-implantation would require a femoral PM which is not ever a good. Her left side would not be suitable for PPM as she has her dialysis access in the upper arm. Epicardial pacing not likely a good option as patient's chronic and multitude of medical problems makes her a poor candidate for thoracotomy. I would be glad to see the patient in followup or she can  followup with her cardiologists in [place].  Leonia ReevesGregg Renji Berwick,M.D.

## 2013-12-02 ENCOUNTER — Encounter (HOSPITAL_COMMUNITY)
Admission: AD | Disposition: A | Discharge status: Home Health | Payer: Self-pay | Source: Other Acute Inpatient Hospital | Attending: Emergency Medicine

## 2013-12-02 ENCOUNTER — Encounter (HOSPITAL_COMMUNITY): Payer: Self-pay | Admitting: Gastroenterology

## 2013-12-02 DIAGNOSIS — I059 Rheumatic mitral valve disease, unspecified: Secondary | ICD-10-CM

## 2013-12-02 HISTORY — PX: TEE WITHOUT CARDIOVERSION: SHX5443

## 2013-12-02 LAB — GLUCOSE, CAPILLARY
Glucose-Capillary: 91 mg/dL (ref 70–99)
Glucose-Capillary: 81 mg/dL (ref 70–99)
Glucose-Capillary: 81 mg/dL (ref 70–99)
Glucose-Capillary: 96 mg/dL (ref 70–99)
Glucose-Capillary: 106 mg/dL — ABNORMAL HIGH (ref 70–99)

## 2013-12-02 LAB — HEPATITIS PANEL, ACUTE
HCV Ab: NEGATIVE
HEP B C IGM: NONREACTIVE
HEP B S AG: NEGATIVE
Hep A IgM: REACTIVE — AB

## 2013-12-02 SURGERY — ECHOCARDIOGRAM, TRANSESOPHAGEAL
Anesthesia: Moderate Sedation

## 2013-12-02 MED ORDER — MIDAZOLAM HCL 10 MG/2ML IJ SOLN
INTRAMUSCULAR | Status: DC | PRN
  Administered 2013-12-02 (×2): 1 mg via INTRAVENOUS

## 2013-12-02 MED ORDER — HYDRALAZINE HCL 25 MG PO TABS
25.0000 mg | ORAL_TABLET | Freq: Three times a day (TID) | ORAL | Status: DC
  Administered 2013-12-02 – 2013-12-03 (×4): 25 mg via ORAL
  Filled 2013-12-02 (×5): qty 1

## 2013-12-02 MED ORDER — FENTANYL CITRATE 0.05 MG/ML IJ SOLN
INTRAMUSCULAR | Status: AC
  Filled 2013-12-02: qty 2

## 2013-12-02 MED ORDER — MIDAZOLAM HCL 5 MG/ML IJ SOLN
INTRAMUSCULAR | Status: AC
Start: 2013-12-02 — End: 2013-12-02
  Filled 2013-12-02: qty 1

## 2013-12-02 MED ORDER — BUTAMBEN-TETRACAINE-BENZOCAINE 2-2-14 % EX AERO
INHALATION_SPRAY | CUTANEOUS | Status: DC | PRN
  Administered 2013-12-02: 2 via TOPICAL

## 2013-12-02 MED ORDER — SODIUM CHLORIDE 0.9 % IV SOLN
INTRAVENOUS | Status: DC
  Administered 2013-12-02: 08:00:00 via INTRAVENOUS

## 2013-12-02 NOTE — Interval H&P Note (Signed)
History and Physical Interval Note:  12/02/2013 8:46 AM  Maria LefevreVera M Guinta  has presented today for surgery, with the diagnosis of pacer infection  The various methods of treatment have been discussed with the patient and family. After consideration of risks, benefits and other options for treatment, the patient has consented to  Procedure(s): TRANSESOPHAGEAL ECHOCARDIOGRAM (TEE) (N/A) as a surgical intervention .  The patient's history has been reviewed, patient examined, no change in status, stable for surgery.  I have reviewed the patient's chart and labs.  Questions were answered to the patient's satisfaction.     Olga MillersBrian Crenshaw

## 2013-12-02 NOTE — Progress Notes (Signed)
Physical Therapy Treatment Patient Details Name: Maria LefevreVera M Melecio MRN: [id number] DOB: Sep 17, 1932 Today's Date: 12/02/2013    History of Present Illness Pt admitted with severe LE pain which has since resolved and hypotension following HD.  Found to have bacteremia due to enterococcus, septic shock.  PMH includes:  CAD, advanced COPD, ESRD.  Pt was recently admitted with pulmonary edema and gastroenteritis per chart review.    PT Comments    Pt. was able to ambulate short distance with sats remaining within acceptable limits.  Fatigues quickly and needs short standing rest breaks.  Pt. With c/o soreness on plantar surface of right foot.  No redness or irritation present on examination.  Skin intact with good coloration.  Follow Up Recommendations  Home health PT;Other (comment) (continue HHRN and aide 8 hours per day 6 days per week)     Equipment Recommendations  None recommended by PT    Recommendations for Other Services       Precautions / Restrictions Precautions Precautions: Fall Restrictions Weight Bearing Restrictions: No    Mobility  Bed Mobility Overal bed mobility: Needs Assistance Bed Mobility: Sidelying to Sit   Sidelying to sit: Min guard       General bed mobility comments: uses rail and HOB elevated; pt. would not let me lower the head of the bed to imitate home, stating she would get dizzy  Transfers Overall transfer level: Needs assistance Equipment used: Rolling walker (2 wheeled) Transfers: Sit to/from Stand Sit to Stand: Min guard         General transfer comment: god technique; slight unsteadiness.  Pt. rose to stand after having sat at EOB several minutes to adjust to being upright  Ambulation/Gait Ambulation/Gait assistance: Min guard Ambulation Distance (Feet): 75 Feet Assistive device: Rolling walker (2 wheeled) Gait Pattern/deviations: Step-through pattern Gait velocity: decr   General Gait Details: no overt LOB noted with RW, min guard  assist for safety   Stairs            Wheelchair Mobility    Modified Rankin (Stroke Patients Only)       Balance                                    Cognition Arousal/Alertness: Awake/alert Behavior During Therapy: WFL for tasks assessed/performed Overall Cognitive Status: Within Functional Limits for tasks assessed                      Exercises      General Comments        Pertinent Vitals/Pain See vitals tab Sats remained 92% or greater throughout PT session, on 2 L O2.    Home Living                      Prior Function            PT Goals (current goals can now be found in the care plan section) Progress towards PT goals: Progressing toward goals    Frequency  Min 3X/week    PT Plan Current plan remains appropriate    Co-evaluation             End of Session Equipment Utilized During Treatment: Gait belt;Oxygen Activity Tolerance: Patient tolerated treatment well Patient left: in bed;with call bell/phone within reach;with bed alarm set;with family/visitor present     Time: 1340-1406 PT Time Calculation (min): 26  min  Charges:  $Gait Training: 23-37 mins                    G Codes:      Ferman HammingBlankenship, Naela Nodal B 12/02/2013, 2:16 PM Weldon PickingSusan Omolara Carol PT Acute Rehab Services [phone] Beeper [phone]

## 2013-12-02 NOTE — Progress Notes (Signed)
[facility] Progress Note  Subjective:   Sore throat s/p TEE this a.m.  Tired of being in the bed. Wants to go home.  Objective Filed Vitals:   12/02/13 0908 12/02/13 0916 12/02/13 0924 12/02/13 0928  BP: 151/53 153/68 167/71 155/73  Pulse: 74 78  75  Temp:  98.3 F (36.8 C)    TempSrc:  Oral    Resp: 15 14  14   Height:      Weight:      SpO2: 100% 97%  99%   Physical Exam General: Thin, elderly, NAD Heart: RRR Lungs: Faint bibasilar crackles. No wheezes/rales Abdomen: Soft, NT, +BS Extremities: No LE edema Dialysis Access: Large L AVF + bruit  Dialysis Orders: MWF @ [facility]  3.5h 44.5kg 2K/2Ca 350/A1.5 P2 Heparin- none AVF @ [facility]  Aranesp 40 mcg & Venofer 50 mg on Wed  Assessment/Plan: 1. 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 TEE neg for vegetation. Cardiology has seen. She is now on IV amp and gent.  The gent can be given at OP dialysis but the ampicillin is BID and will require home administration meaning there will be a need for a PICC line most likely (central PICC for ESRD)  2. ESRD - MWF, K+5. Next HD tomorrow, plan first shift 3. Hypotension/volume - BP elevated. On hydralazine TID. Up 3kg by wgts with crackles on exam. Try for UF goal to EDW tomorrow I recommended an extra HD today for volume but patient refusing 4. Anemia - Hgb 9.4, improving on Aranesp 100 q Mon. Continue Weekly Fe 5. HPTH- Ca 8.8 (10.2 corrected). Cont low Ca bath, sensipar. Holding phoslo and vit D, consider change to non-Ca binder at dc 6. Nutrition - Alb 2.2, diet liberalized, vitamin, supplements. Appreciate RD assistance. 7. DM - insulin per primary   Scot JunKaren E. Broadus JohnWarren, PA-C [facility] Pager [phone] 12/02/2013,1:00 PM  LOS: 8 days   Pt seen, examined, agree w assess/plan as above with additions as indicated.  Vinson Moselleob Korina Tretter MD pager [phone]    cell [phone] 12/02/2013,  1:26 PM     Additional Objective Labs: Basic Metabolic Panel:  Recent Labs Lab 11/27/13 0430 11/29/13 0720 11/30/13 0523 12/01/13 0733  NA 139 136* 139 140  K 4.4 5.3 4.6 5.0  CL 99 95* 97 101  CO2 27 26 28 24   GLUCOSE 76 83 78 90  BUN 22 38* 21 30*  CREATININE 3.06* 5.07* 3.37* 4.75*  CALCIUM 10.1 9.5 8.7 8.8  PHOS 3.0 4.4  --  4.2   Liver Function Tests:  Recent Labs Lab 11/26/13 0722 11/29/13 0720 12/01/13 0733  ALBUMIN 2.1* 2.1* 2.2*   No results found for this basename: LIPASE, AMYLASE,  in the last 168 hours CBC:  Recent Labs Lab 11/26/13 0722 11/29/13 0720 12/01/13 0733  WBC 7.7 5.4 6.7  HGB 8.6* 8.9* 9.4*  HCT 27.4* 28.5* 30.5*  MCV 103.4* 104.4* 106.6*  PLT 130* 174 204   Blood Culture    Component Value Date/Time   SDES BLOOD RIGHT HAND 11/27/2013 1630   SDES BLOOD RIGHT ARM 11/27/2013 1630   SPECREQUEST BOTTLES DRAWN AEROBIC ONLY 10 CC 11/27/2013 1630   SPECREQUEST BOTTLES DRAWN AEROBIC ONLY 9.5 CC 11/27/2013 1630   CULT  Value:        BLOOD CULTURE RECEIVED NO GROWTH TO DATE CULTURE WILL BE HELD FOR 5 DAYS BEFORE ISSUING A FINAL NEGATIVE REPORT Performed  at [facility] 11/27/2013 1630   CULT  Value:        BLOOD CULTURE RECEIVED NO GROWTH TO DATE CULTURE WILL BE HELD FOR 5 DAYS BEFORE ISSUING A FINAL NEGATIVE REPORT [facility]formed at [facility] 11/27/2013 1630   REPTSTATUS PENDING 11/27/2013 1630   REPTSTATUS PENDING 11/27/2013 1630    CBG:  Recent Labs Lab 12/01/13 1124 12/01/13 1617 12/01/13 2224 12/02/13 0740 12/02/13 1203  GLUCAP 74 101* 91 81 81    Studies/Results: No results found. Medications: . sodium chloride Stopped (11/27/13 0746)   . ampicillin (OMNIPEN) IV  2 g Intravenous Q12H  . antiseptic oral rinse  15 mL Mouth Rinse BID  . aspirin EC  81 mg Oral Daily  . atorvastatin  80 mg Oral Daily  . cinacalcet  60 mg Oral Q breakfast  . darbepoetin (ARANESP) injection - DIALYSIS  100 mcg Intravenous Q Mon-HD   . docusate sodium  200 mg Oral BID  . ferric gluconate (FERRLECIT/NULECIT) IV  62.5 mg Intravenous Q Wed-HD  . folic acid  0.5 mg Oral Daily  . gentamicin  60 mg Intravenous Q M,W,F-HD  . heparin  5,000 Units Subcutaneous 3 times per day  . hydrALAZINE  25 mg Oral TID  . insulin aspart  0-5 Units Subcutaneous QHS  . insulin aspart  0-9 Units Subcutaneous TID WC  . levothyroxine  150 mcg Oral QAC breakfast  . olopatadine  1 drop Both Eyes BID  . pantoprazole  40 mg Oral Daily  . tiotropium  18 mcg Inhalation Daily

## 2013-12-02 NOTE — Progress Notes (Signed)
[facility]  Date of Admission:  11/24/2013  Antibiotics: Ampicillin gentamicin  Subjective: Ready to go home  Objective: Temp:  [97.7 F (36.5 C)-98.3 F (36.8 C)] 98.2 F (36.8 C) (07/02 1707) Pulse Rate:  [74-84] 78 (07/02 1707) Resp:  [14-21] 16 (07/02 1707) BP: (105-179)/(45-73) 117/60 mmHg (07/02 1707) SpO2:  [92 %-100 %] 98 % (07/02 1707) Weight:  [105 lb (47.628 kg)] 105 lb (47.628 kg) (07/01 2247)  General: awake, alert nad Skin: no rashes Lungs: CTA Cor: RRR Abdomen: soft, nt, nd   Lab Results Lab Results  Component Value Date   WBC 6.7 12/01/2013   HGB 9.4* 12/01/2013   HCT 30.5* 12/01/2013   MCV 106.6* 12/01/2013   PLT 204 12/01/2013    Lab Results  Component Value Date   CREATININE 4.75* 12/01/2013   BUN 30* 12/01/2013   NA 140 12/01/2013   K 5.0 12/01/2013   CL 101 12/01/2013   CO2 24 12/01/2013    Lab Results  Component Value Date   ALT 32 11/24/2013   AST 43* 11/24/2013   ALKPHOS 150* 11/24/2013   BILITOT 0.3 11/24/2013      Microbiology: Recent Results (from the past 240 hour(s))  MRSA PCR SCREENING     Status: None   Collection Time    11/24/13 12:53 AM      Result Value Ref Range Status   MRSA by PCR NEGATIVE  NEGATIVE Final   Comment:            The GeneXpert MRSA Assay (FDA     approved for NASAL specimens     only), is one component of a     comprehensive MRSA colonization     surveillance program. It is not     intended to diagnose MRSA     infection nor to guide or     monitor treatment for     MRSA infections.  CULTURE, BLOOD (ROUTINE X 2)     Status: None   Collection Time    11/24/13  2:00 AM      Result Value Ref Range Status   Specimen Description BLOOD RIGHT HAND   Final   Special Requests BOTTLES DRAWN AEROBIC ONLY 1CC   Final   Culture  Setup Time     Final   Value: 11/24/2013 08:19     Performed at [facility]   Culture     Final   Value: ENTEROCOCCUS SPECIES     Note: COMBINATION THERAPY  OF HIGH DOSE AMPICILLIN OR VANCOMYCIN, PLUS AN AMINOGLYCOSIDE, IS USUALLY INDICATED FOR SERIOUS ENTEROCOCCAL INFECTIONS.     Note: Gram Stain Report Called to,Read Back By and Verified With: SARA GROCE 11/26/13 0900 BY SMITHERSJ     Performed at [facility]   Report Status 11/28/2013 FINAL   Final   Organism ID, Bacteria ENTEROCOCCUS SPECIES   Final  CULTURE, BLOOD (ROUTINE X 2)     Status: None   Collection Time    11/24/13  2:31 AM      Result Value Ref Range Status   Specimen Description BLOOD RIGHT THUMB   Final   Special Requests BOTTLES DRAWN AEROBIC ONLY 3CC   Final   Culture  Setup Time     Final   Value: 11/24/2013 08:20     Performed at [facility]   Culture     Final   Value: NO GROWTH 5 DAYS     Performed  at [facility]   Report Status 11/30/2013 FINAL   Final  CULTURE, BLOOD (ROUTINE X 2)     Status: None   Collection Time    11/27/13  4:30 PM      Result Value Ref Range Status   Specimen Description BLOOD RIGHT HAND   Final   Special Requests BOTTLES DRAWN AEROBIC ONLY 10 CC   Final   Culture  Setup Time     Final   Value: 11/28/2013 02:23     Performed at [facility]   Culture     Final   Value:        BLOOD CULTURE RECEIVED NO GROWTH TO DATE CULTURE WILL BE HELD FOR 5 DAYS BEFORE ISSUING A FINAL NEGATIVE REPORT     Performed at [facility]   Report Status PENDING   Incomplete  CULTURE, BLOOD (ROUTINE X 2)     Status: None   Collection Time    11/27/13  4:30 PM      Result Value Ref Range Status   Specimen Description BLOOD RIGHT ARM   Final   Special Requests BOTTLES DRAWN AEROBIC ONLY 9.5 CC   Final   Culture  Setup Time     Final   Value: 11/28/2013 02:23     Performed at [facility]   Culture     Final   Value:        BLOOD CULTURE RECEIVED NO GROWTH TO DATE CULTURE WILL BE HELD FOR 5 DAYS BEFORE ISSUING A FINAL NEGATIVE REPORT     Performed at [facility]   Report Status PENDING   Incomplete      Studies/Results: No results found.  Assessment/Plan: 1) Enterococcal bacteremia - TEE negative for vegetation.  Repeat cultures remain negative.  Has been seen by electrophysiology and not an ideal candidate for removal.   I recommend treatment as outlined with 4 weeks of antibiotics. I do not feel suppressive antibiotics are absolutely indicated after treatment completion She can go out on vancomycin with dialysis (since ampicillin would require central line placement) along with gentamicin and treat through July 24th We will [facility]  Jasie Meleski, MD [email or web address] [phone] pager   [phone] cell 12/02/2013, 5:15 PM

## 2013-12-02 NOTE — Progress Notes (Addendum)
[facility] PROGRESS NOTE  [name] ZOX:[id number] DOB: 01/16/1933 DOA: 11/24/2013 PCP: Galvin Proffer, MD   Brief Narrative: 80 y.o. female with a history of hypertension, CAD, COPD, and ESRD on dialysis at the [facility], recently at [facility] 6/13-16 for pulmonary edema secondary to noncompliance with dialysis, who returned home from her treatment yesterday and had sudden onset of severe lower extremity pain, bilaterally from her pelvis to her knees, requiring transfer by EMS to [facility]. In the ED her CT showed only extensive atherosclerosis, and chest x-ray showed minimal perihilar infiltrates, unchanged from prior imaging, but she was found to be hypotensive with blood pressure as low as 60/15 and was transferred to [facility]. She was transferred to ICU and started on Levophed,given antibiotics for possible septic shock. She missed dialysis on Monday 6/22 secondary to temporary diarrhea, Both sets of blood cultures done today her have grown fully sensitive Enterococcus faecalis. One of 2 blood cultures here is also positive. Initially started on vancomycin and cefepime, cefepime discontinued 6/27. Non-vancomycin and gentamicin. 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 involving her AICD Infectious disease managing antibiotics- hopefully can have antibiotics that can be given during dialysis treatments- not sure if she is a good candidate to do her own antibiotics at home echo 6/25 with TR and presumed right-side endocarditis involving AICD- TEE negative for vegetations Off vasopressors, transferred to telemetry on 6/30  Advanced COPD Home oxygen dependent on 2 L Continue nebulizer treatments  End-stage renal disease Hemodialysis Monday Wednesday  Friday  Hypertension BP elevated this AM- will resume Hydralazine today  Chronic Ischemic CHF  Pacemaker Systolic murmur c/w tricuspid regurgitation 2-3/6 - severe by Echo  Diabetes mellitus?? - not on medication at home- sugars normal -d/c sliding scale insulin  Hypothyroidism > TSH 6.38, improved from 2 weeks ago but still elevated - likely has sick euthyroid syndrome- no need to titrate medications yet  Encephalopathy Improved,  Consultants:  PCCM  Nephrology  Infectious disease  SIGNIFICANT EVENTS / STUDIES:  CT head 6/23 >> no acute abnormalities  CT abd/pelvis 6/23 >> renal atrophy, rectal wall thickening ? Significance, extensive athlerosclerosis  CXR 6/23 [facility]) >> cardiomegaly, pacer, minimal perihilar infiltrates w little change from priors  TTE 6/25 >> LVH, mod-to-severe decrease LV fxn, severe TR and moderate MR, no clear-cut vegetations   LINES / TUBES:  L femoral CVC 6/23 >> out    CULTURES:  Blood 6/23 [facility]) >> GPC's >> enterococcus >>  Blood 6/24 ([facility]) >> GPC chains >> enterococcus ss ampiillin   Antibiotics: Anti-infectives   Start     Dose/Rate Route Frequency Ordered Stop   12/01/13 1430  ampicillin (OMNIPEN) 2 g in sodium chloride 0.9 % 50 mL IVPB     2 g 150 mL/hr over 20 Minutes Intravenous Every 12 hours 12/01/13 1315     12/01/13 1200  gentamicin (GARAMYCIN) IVPB 60 mg     60 mg 100 mL/hr over 30 Minutes Intravenous Every M-W-F (Hemodialysis) 11/29/13 1250     12/01/13 1200  vancomycin (VANCOCIN) IVPB 750 mg/150 ml premix  Status:  Discontinued     750 mg 150 mL/hr over 60 Minutes Intravenous Every M-W-F (Hemodialysis) 12/01/13 0820 12/01/13 1315   11/29/13 1200  gentamicin (GARAMYCIN) 40 mg in dextrose 5 % 50 mL IVPB  Status:  Discontinued  40 mg 102 mL/hr over 30 Minutes Intravenous Every M-W-F (Hemodialysis) 11/27/13 1453 11/29/13 1250   11/27/13 1600  gentamicin (GARAMYCIN) IVPB 80 mg     80 mg 100 mL/hr over 30  Minutes Intravenous  Once 11/27/13 1452 11/27/13 1617   11/26/13 1800  ceFEPIme (MAXIPIME) 2 g in dextrose 5 % 50 mL IVPB  Status:  Discontinued     2 g 100 mL/hr over 30 Minutes Intravenous Every M-W-F (1800) 11/25/13 1154 11/27/13 1400   11/26/13 1200  vancomycin (VANCOCIN) 500 mg in sodium chloride 0.9 % 100 mL IVPB  Status:  Discontinued     500 mg 100 mL/hr over 60 Minutes Intravenous Every M-W-F (Hemodialysis) 11/25/13 1154 12/01/13 0820   11/24/13 0230  vancomycin (VANCOCIN) IVPB 1000 mg/200 mL premix     1,000 mg 200 mL/hr over 60 Minutes Intravenous  Once 11/24/13 0222 11/24/13 0419   11/24/13 0230  ceFEPIme (MAXIPIME) 2 g in dextrose 5 % 50 mL IVPB     2 g 100 mL/hr over 30 Minutes Intravenous  Once 11/24/13 0222 11/24/13 0349         Objective: Filed Vitals:   12/02/13 0908 12/02/13 0916 12/02/13 0924 12/02/13 0928  BP: 151/53 153/68 167/71 155/73  Pulse: 74 78  75  Temp:  98.3 F (36.8 C)    TempSrc:  Oral    Resp: 15 14  14   Height:      Weight:      SpO2: 100% 97%  99%    Intake/Output Summary (Last 24 hours) at 12/02/13 1244 Last data filed at 12/02/13 0931  Gross per 24 hour  Intake    610 ml  Output    201 ml  Net    409 ml    Exam:  General: sleepy Skin: Scattered ecchymoses on her arms and legs. No splinter or conjunctival hemorrhages noted  Lungs: Clear  CVS: Regular S1 and S2. 2/6 systolic murmur heard at left lower sternal border  Chest wall: No abnormality noted around right anterior chest AICD  Abdomen: Soft and nontender      Data Reviewed: Basic Metabolic Panel:  Recent Labs Lab 11/26/13 0722 11/27/13 0430 11/29/13 0720 11/30/13 0523 12/01/13 0733  NA 135* 139 136* 139 140  K 5.3 4.4 5.3 4.6 5.0  CL 97 99 95* 97 101  CO2 22 27 26 28 24   GLUCOSE 75 76 83 78 90  BUN 48* 22 38* 21 30*  CREATININE 5.01* 3.06* 5.07* 3.37* 4.75*  CALCIUM 10.1 10.1 9.5 8.7 8.8  MG  --  1.7  --   --   --   PHOS 5.3* 3.0 4.4  --  4.2    Liver  Function Tests:  Recent Labs Lab 11/26/13 0722 11/29/13 0720 12/01/13 0733  ALBUMIN 2.1* 2.1* 2.2*   No results found for this basename: LIPASE, AMYLASE,  in the last 168 hours No results found for this basename: AMMONIA,  in the last 168 hours  CBC:  Recent Labs Lab 11/26/13 0722 11/29/13 0720 12/01/13 0733  WBC 7.7 5.4 6.7  HGB 8.6* 8.9* 9.4*  HCT 27.4* 28.5* 30.5*  MCV 103.4* 104.4* 106.6*  PLT 130* 174 204    Cardiac Enzymes: No results found for this basename: CKTOTAL, CKMB, CKMBINDEX, TROPONINI,  in the last 168 hours BNP (last 3 results) No results found for this basename: PROBNP,  in the last 8760 hours   CBG:  Recent Labs Lab 12/01/13 1124 12/01/13 1617 12/01/13 2224  12/02/13 0740 12/02/13 1203  GLUCAP 74 101* 91 81 81    Recent Results (from the past 240 hour(s))  MRSA PCR SCREENING     Status: None   Collection Time    11/24/13 12:53 AM      Result Value Ref Range Status   MRSA by PCR NEGATIVE  NEGATIVE Final   Comment:            The GeneXpert MRSA Assay (FDA     approved for NASAL specimens     only), is one component of a     comprehensive MRSA colonization     surveillance program. It is not     intended to diagnose MRSA     infection nor to guide or     monitor treatment for     MRSA infections.  CULTURE, BLOOD (ROUTINE X 2)     Status: None   Collection Time    11/24/13  2:00 AM      Result Value Ref Range Status   Specimen Description BLOOD RIGHT HAND   Final   Special Requests BOTTLES DRAWN AEROBIC ONLY 1CC   Final   Culture  Setup Time     Final   Value: 11/24/2013 08:19     Performed at [facility]   Culture     Final   Value: ENTEROCOCCUS SPECIES     Note: COMBINATION THERAPY OF HIGH DOSE AMPICILLIN OR VANCOMYCIN, PLUS AN AMINOGLYCOSIDE, IS USUALLY INDICATED FOR SERIOUS ENTEROCOCCAL INFECTIONS.     Note: Gram Stain Report Called to,Read Back By and Verified With: SARA GROCE 11/26/13 0900 BY SMITHERSJ     Performed  at [facility]   Report Status 11/28/2013 FINAL   Final   Organism ID, Bacteria ENTEROCOCCUS SPECIES   Final  CULTURE, BLOOD (ROUTINE X 2)     Status: None   Collection Time    11/24/13  2:31 AM      Result Value Ref Range Status   Specimen Description BLOOD RIGHT THUMB   Final   Special Requests BOTTLES DRAWN AEROBIC ONLY 3CC   Final   Culture  Setup Time     Final   Value: 11/24/2013 08:20     Performed at [facility]   Culture     Final   Value: NO GROWTH 5 DAYS     Performed at [facility]   Report Status 11/30/2013 FINAL   Final  CULTURE, BLOOD (ROUTINE X 2)     Status: None   Collection Time    11/27/13  4:30 PM      Result Value Ref Range Status   Specimen Description BLOOD RIGHT HAND   Final   Special Requests BOTTLES DRAWN AEROBIC ONLY 10 CC   Final   Culture  Setup Time     Final   Value: 11/28/2013 02:23     Performed at [facility]   Culture     Final   Value:        BLOOD CULTURE RECEIVED NO GROWTH TO DATE CULTURE WILL BE HELD FOR 5 DAYS BEFORE ISSUING A FINAL NEGATIVE REPORT     Performed at [facility]   Report Status PENDING   Incomplete  CULTURE, BLOOD (ROUTINE X 2)     Status: None   Collection Time    11/27/13  4:30 PM      Result Value Ref Range Status   Specimen Description BLOOD RIGHT ARM  Final   Special Requests BOTTLES DRAWN AEROBIC ONLY 9.5 CC   Final   Culture  Setup Time     Final   Value: 11/28/2013 02:23     Performed at [facility]   Culture     Final   Value:        BLOOD CULTURE RECEIVED NO GROWTH TO DATE CULTURE WILL BE HELD FOR 5 DAYS BEFORE ISSUING A FINAL NEGATIVE REPORT     Performed at [facility]   Report Status PENDING   Incomplete     Studies: Ir Pta Venous Left  11/16/2013   CLINICAL DATA:  80 year old female with end-stage renal disease on hemodialysis via a left upper extremity brachiocephalic arteriovenous fistula. The fistula was created approximately 12 years  ago. She has been using this access for the last 4- 5 years. She has been suffering with prolonged (45 min) bleeding times following dialysis concerning for stenosis of the draining or central veins.  EXAM: IR LEFT SHUNTOGRAM/FISTULAGRAM; PTA VENOUS  Date: 11/16/2013  PROCEDURE: 1. Diagnostic fistulogram 2. Angioplasty of tandem stenoses of the draining vein 3. Completion fistulogram Interventional Radiologist:  Sterling Big, MD  ANESTHESIA/SEDATION: None required  FLUOROSCOPY TIME:  1 minute 24 seconds  CONTRAST:  50mL OMNIPAQUE IOHEXOL 300 MG/ML  SOLN  TECHNIQUE: Informed consent was obtained from the patient following explanation of the procedure, risks, benefits and alternatives. The patient understands, agrees and consents for the procedure. All questions were addressed. A time out was performed.  Maximal barrier sterile technique utilized including caps, mask, sterile gowns, sterile gloves, large sterile drape, hand hygiene, and Betadine skin prep.  The fistula was accessed percutaneously adjacent to the arterial anastomosis with an 18 gauge angiocatheter. A diagnostic fistulogram was performed. 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 Chest central to this in the mid arm and there is a segment of a peripherally calcified vein with tandem stenoses. The more peripheral stenosis is moderately high grade (60- 70%) the second stenosis is more web-like and approximately 50% diameter stenosis. The cephalic arch and central veins are widely patent. The arterial anastomosis is widely patent.  The risks, benefits and alternatives to angioplasty of the tandem stenotic venous segment were described to the patient. She understands and wishes to proceed. Therefore, a Bentson wire was advanced into the central veins. The angiocatheter was exchanged over the wire for a working 6 [place] vascular sheath. A 6 x 40 mm Conquest balloon was positioned  across the tandem stenoses and inflated to 20 atmospheres with full effacement of the balloon. The inflation was held for 2 min.  The balloon was deflated and removed. Follow-up fistulogram demonstrates near complete resolution of the tandem stenoses. The more severe stenosis demonstrates an approximate 10% residual narrowing. The more moderate stenosis demonstrates no residual narrowing. There is brisk flow.  The vascular sheath was removed and hemostasis was attained with the assistance of a 2-0 nylon pursestring suture. The patient tolerated the procedure very well.  IMPRESSION: 1. Diagnostic fistulogram demonstrates tandem stenoses in the mid aspect of the draining cephalic vein of the left upper extremity brachiocephalic arteriovenous fistula. 2. Angioplasty of both stenoses to 6 mm with excellent angiographic result. Signed,  Sterling Big, MD  [facility]  [facility]   Electronically Signed   By: Malachy Moan M.D.   On: 11/16/2013 17:07   Dg Chest Port 1 View  11/25/2013  CLINICAL DATA:  Hypoxemia  EXAM: PORTABLE CHEST - 1 VIEW  COMPARISON:  11/23/2013  FINDINGS: Diffuse mild bilateral interstitial thickening. No pleural effusion or pneumothorax. Enlargement of the central pulmonary vasculature. Stable cardiomegaly. Prior CABG. Dual lead AICD. Unremarkable osseous structures.  The osseous structures are unremarkable.  IMPRESSION: Mild pulmonary venous congestion.   Electronically Signed   By: Elige Ko   On: 11/25/2013 18:56   Dg Chest Port 1 View  11/14/2013   CLINICAL DATA:  Shortness of breath.  Cough.  EXAM: PORTABLE CHEST - 1 VIEW  COMPARISON:  Chest x-ray 11/13/2013.  FINDINGS: Cardiomegaly with pulmonary vascular prominence and interstitial prominence present consistent with congestive heart failure with pulmonary interstitial edema. Pneumonitis cannot be excluded. Cardiac pacer with lead tips in right atrium and right ventricle.  Prior CABG. No pneumothorax. No acute bony abnormality.  IMPRESSION: Congestive heart failure with pulmonary interstitial edema.   Electronically Signed   By: Maisie Fus  Register   On: 11/14/2013 14:47   Dg Abd Portable 2v  11/14/2013   CLINICAL DATA:  Abdominal pain.  Constipation .  EXAM: PORTABLE ABDOMEN - 2 VIEW  COMPARISON:  CT 06/29/2013  FINDINGS: Soft tissue structures are unremarkable. Moderate amount of stool noted: Marland Kitchen The gas pattern is nonspecific. Surgical clips right upper quadrant. Aortoiliac and visceral atherosclerotic vascular disease. Bilateral iliac stents are noted. Pelvic phleboliths. Degenerative changes lumbar spine and both hips. Cardiomegaly. Cardiac pacer.  IMPRESSION: 1. Moderate amount of stool in colon.  No bowel distention. 2. Aortoiliac atherosclerotic vascular disease. Bilateral iliac stents are present. Visceral atherosclerotic vascular disease. 3. Cholecystectomy. 4. Cardiomegaly.  Cardiac pacer.   Electronically Signed   By: Maisie Fus  Register   On: 11/14/2013 08:04   Ir Shuntogram/ Fistulagram Left Mod Sed  11/16/2013   CLINICAL DATA:  80 year old female with end-stage renal disease on hemodialysis via a left upper extremity brachiocephalic arteriovenous fistula. The fistula was created approximately 12 years ago. She has been using this access for the last 4- 5 years. She has been suffering with prolonged (45 min) bleeding times following dialysis concerning for stenosis of the draining or central veins.  EXAM: IR LEFT SHUNTOGRAM/FISTULAGRAM; PTA VENOUS  Date: 11/16/2013  PROCEDURE: 1. Diagnostic fistulogram 2. Angioplasty of tandem stenoses of the draining vein 3. Completion fistulogram Interventional Radiologist:  Sterling Big, MD  ANESTHESIA/SEDATION: None required  FLUOROSCOPY TIME:  1 minute 24 seconds  CONTRAST:  50mL OMNIPAQUE IOHEXOL 300 MG/ML  SOLN  TECHNIQUE: Informed consent was obtained from the patient following explanation of the procedure, risks, benefits  and alternatives. The patient understands, agrees and consents for the procedure. All questions were addressed. A time out was performed.  Maximal barrier sterile technique utilized including caps, mask, sterile gowns, sterile gloves, large sterile drape, hand hygiene, and Betadine skin prep.  The fistula was accessed percutaneously adjacent to the arterial anastomosis with an 18 gauge angiocatheter. A diagnostic fistulogram was performed. 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 Chest central to this in the mid arm and there is a segment of a peripherally calcified vein with tandem stenoses. The more peripheral stenosis is moderately high grade (60- 70%) the second stenosis is more web-like and approximately 50% diameter stenosis. The cephalic arch and central veins are widely patent. The arterial anastomosis is widely patent.  The risks, benefits and alternatives to angioplasty of the tandem stenotic venous segment were described to the patient. She understands and wishes to  proceed. Therefore, a Bentson wire was advanced into the central veins. The angiocatheter was exchanged over the wire for a working 6 [place] vascular sheath. A 6 x 40 mm Conquest balloon was positioned across the tandem stenoses and inflated to 20 atmospheres with full effacement of the balloon. The inflation was held for 2 min.  The balloon was deflated and removed. Follow-up fistulogram demonstrates near complete resolution of the tandem stenoses. The more severe stenosis demonstrates an approximate 10% residual narrowing. The more moderate stenosis demonstrates no residual narrowing. There is brisk flow.  The vascular sheath was removed and hemostasis was attained with the assistance of a 2-0 nylon pursestring suture. The patient tolerated the procedure very well.  IMPRESSION: 1. Diagnostic fistulogram demonstrates tandem stenoses in the mid aspect of the draining  cephalic vein of the left upper extremity brachiocephalic arteriovenous fistula. 2. Angioplasty of both stenoses to 6 mm with excellent angiographic result. Signed,  Sterling BigHeath K. McCullough, MD  [facility]  [facility]   Electronically Signed   By: Malachy MoanHeath  McCullough M.D.   On: 11/16/2013 17:07    Scheduled Meds: . ampicillin (OMNIPEN) IV  2 g Intravenous Q12H  . antiseptic oral rinse  15 mL Mouth Rinse BID  . aspirin EC  81 mg Oral Daily  . atorvastatin  80 mg Oral Daily  . cinacalcet  60 mg Oral Q breakfast  . darbepoetin (ARANESP) injection - DIALYSIS  100 mcg Intravenous Q Mon-HD  . docusate sodium  200 mg Oral BID  . ferric gluconate (FERRLECIT/NULECIT) IV  62.5 mg Intravenous Q Wed-HD  . folic acid  0.5 mg Oral Daily  . gentamicin  60 mg Intravenous Q M,W,F-HD  . heparin  5,000 Units Subcutaneous 3 times per day  . insulin aspart  0-5 Units Subcutaneous QHS  . insulin aspart  0-9 Units Subcutaneous TID WC  . levothyroxine  150 mcg Oral QAC breakfast  . olopatadine  1 drop Both Eyes BID  . pantoprazole  40 mg Oral Daily  . tiotropium  18 mcg Inhalation Daily   Continuous Infusions: . sodium chloride Stopped (11/27/13 0746)       Time spent: 30 minutes   Darlene Bartelt, MD  [facility] Pager [email or web address], password [email or web address] 12/02/2013, 12:44 PM  LOS: 8 days

## 2013-12-02 NOTE — CV Procedure (Signed)
See full TEE report in [employer]. No vegetations identified. Maria MillersBrian Crenshaw

## 2013-12-02 NOTE — Progress Notes (Signed)
INITIAL NUTRITION ASSESSMENT  DOCUMENTATION CODES Per approved criteria  -Severe malnutrition in the context of chronic illness   INTERVENTION: LexicographerDiscontinue Resource Breeze. Recommend liberalized diet - discussed with Renal PA-C, Claud KelpKaren Warren. Recommend renal MVI daily. RD to continue to follow nutrition care plan.  NUTRITION DIAGNOSIS: Inadequate oral intake related to poor appetite as evidenced by patient report.   Goal: Intake to meet >90% of estimated nutrition needs.  Monitor:  weight trends, lab trends, I/O's, PO intake, supplement tolerance  Reason for Assessment: Malnutrition Screening Tool  80 y.o. female  Admitting Dx: AICD infection  ASSESSMENT: PMHx significant for ESRD (on HD MWF), CHF, HTN, CABG, COPD. Per renal note, pt frequently misses HD and rarely runs her full tx. Admitted with bilateral thigh and pelvis pain s/p HD on 6/23. Work-up reveals AICD infection.  Underwent TEE on 7/2. Negative for vegetation. Pt currently ordered for Renal/CHO Modified diet, was ordered for Regular previously. On average, intake has been around 50% since admission. Ordered for Resource Breeze PO BID, but is refusing per chart.  Nutrition Focused Physical Exam:  Subcutaneous Fat:  Orbital Region: moderate depletion Upper Arm Region: severe depletion Thoracic and Lumbar Region: severe depletion  Muscle:  Temple Region: moderate depletion Clavicle Bone Region: severe depletion Clavicle and Acromion Bone Region: severe depletion Scapular Bone Region: n/a Dorsal Hand: moderate to severe depletion Patellar Region: severe depletion Anterior Thigh Region: severe depletion Posterior Calf Region: severe depletion  Edema: none  Pt meets criteria for severe MALNUTRITION in the context of chronic illness as evidenced by severe fat and muscle mass loss.   EDW per renal: 44.5 kg  Potassium WNL Phosphorus WNL   Height: Ht Readings from Last 1 Encounters:  11/25/13 5\' 3"  (1.6  m)    Weight: Wt Readings from Last 1 Encounters:  12/01/13 105 lb (47.628 kg)    Ideal Body Weight: 115 lb  % Ideal Body Weight: 91%  Wt Readings from Last 10 Encounters:  12/01/13 105 lb (47.628 kg)  12/01/13 105 lb (47.628 kg)  11/15/13 97 lb (43.999 kg)  06/11/13 108 lb (48.988 kg)  06/07/13 118 lb 6.2 oz (53.7 kg)  07/06/12 107 lb 9.4 oz (48.8 kg)  07/06/12 107 lb 9.4 oz (48.8 kg)  06/26/11 117 lb 11.6 oz (53.4 kg)  09/03/10 119 lb 6.4 oz (54.159 kg)    Usual Body Weight: 115 lb  % Usual Body Weight: 91%  BMI:  Body mass index is 18.6 kg/(m^2). WNL  Estimated Nutritional Needs: Kcal: 1500 - 1800 kcal Protein: at least 63 g protein Fluid: 1.2 liters  Skin: stage I to coccyx  Diet Order: Diabetic and Renal w/ 1200 ml fluid restriction  EDUCATION NEEDS: -No education needs identified at this time   Intake/Output Summary (Last 24 hours) at 12/02/13 1036 Last data filed at 12/02/13 0931  Gross per 24 hour  Intake    660 ml  Output    201 ml  Net    459 ml    Last BM: 6/26  Labs:   Recent Labs Lab 11/27/13 0430 11/29/13 0720 11/30/13 0523 12/01/13 0733  NA 139 136* 139 140  K 4.4 5.3 4.6 5.0  CL 99 95* 97 101  CO2 27 26 28 24   BUN 22 38* 21 30*  CREATININE 3.06* 5.07* 3.37* 4.75*  CALCIUM 10.1 9.5 8.7 8.8  MG 1.7  --   --   --   PHOS 3.0 4.4  --  4.2  GLUCOSE 76 83  78 90    CBG (last 3)   Recent Labs  12/01/13 1617 12/01/13 2224 12/02/13 0740  GLUCAP 101* 91 81    Scheduled Meds: . ampicillin (OMNIPEN) IV  2 g Intravenous Q12H  . antiseptic oral rinse  15 mL Mouth Rinse BID  . aspirin EC  81 mg Oral Daily  . atorvastatin  80 mg Oral Daily  . cinacalcet  60 mg Oral Q breakfast  . darbepoetin (ARANESP) injection - DIALYSIS  100 mcg Intravenous Q Mon-HD  . docusate sodium  200 mg Oral BID  . feeding supplement (RESOURCE BREEZE)  1 Container Oral BID BM  . ferric gluconate (FERRLECIT/NULECIT) IV  62.5 mg Intravenous Q Wed-HD  .  folic acid  0.5 mg Oral Daily  . gentamicin  60 mg Intravenous Q M,W,F-HD  . heparin  5,000 Units Subcutaneous 3 times per day  . insulin aspart  0-5 Units Subcutaneous QHS  . insulin aspart  0-9 Units Subcutaneous TID WC  . levothyroxine  150 mcg Oral QAC breakfast  . olopatadine  1 drop Both Eyes BID  . pantoprazole  40 mg Oral Daily  . tiotropium  18 mcg Inhalation Daily    Continuous Infusions: . sodium chloride Stopped (11/27/13 0746)    Past Medical History  Diagnosis Date  . Carotid artery occlusion     Bilateral carotid artery disease  . Peripheral artery disease   . CHF (congestive heart failure)     EF 25-30%.   . Hypertension   . Hyperlipidemia   . ESRD on hemodialysis 06/20/11    M[address] W[address] F; [address] [place].  Started HD in Sept 2008  . AAA (abdominal aortic aneurysm)   . Stroke ` 2007  . Duodenal ulcer 2008  . S/P CABG (coronary artery bypass graft)     CABG in 2008, had 3 MI's prior  . Depression   . Pneumonia   . COPD, severe   . Hypothyroidism   . Anemia   . Pacemaker   . Atrial fibrillation   . Complete heart block     Past Surgical History  Procedure Laterality Date  . Thyroid surgery  2004  . Dg av dialysis  shunt access exist*l* or  03/2000    left upper arm  . Vaginal hysterectomy  1976  . Hemorrhoid surgery  1971  . Insert / replace / remove pacemaker  1990's    initial placement  . Insert / replace / remove pacemaker  05/2011    MDT ADDRL1 pacemaker implanted by Dr Dulce SellarMunley  . Incisional hernia repair  10/2010    incarcerated  . Common iliac  06/2009    bilateral kissing stents  . Pseudoaneurysm repair  09/1999    right groin  . Av fistula repair  04/2007    left upper arm  . Cardiac catheterization  02/10, 03/12    Most recent showed 3 vessel CAD with patent grafts: SVG to LAD, SVG to PDA and SVG to OM  . Coronary angioplasty with stent placement  09/1999    "1"  . Coronary artery bypass graft  02/2007    CABG X3    Jarold MottoSamantha Chrystian Ressler  MS, RD, LDN Inpatient Registered Dietitian Pager: [phone] After-hours pager: [phone]

## 2013-12-02 NOTE — Care Management Note (Signed)
CARE MANAGEMENT NOTE 12/02/2013  Patient:  [name], [name]   Account Number:  [id number]  Date Initiated:  11/24/2013  Documentation initiated by:  [facility]  Subjective/Objective Assessment:   Admitted with shock - on pressors     Action/Plan:   12/01/2013 Met with pt and validated that she has [place] services with Alvis Lemmings for [facility] and [place] aide. Have asked MD to resume current services and reorder HHPT, per pt request.   Anticipated DC Date:  12/01/2013   Anticipated DC Plan:  [place]  CM consult      Choice offered to / List presented to:  C-1 Patient        [place] arranged  HH-1 RN  [place]      [place] agency  [place]   Status of service:  Completed, signed off Medicare Important Message given?   (If response is "NO", the following Medicare IM given date fields will be blank) Date Medicare IM given:   Medicare IM given by:   Date Additional Medicare IM given:   Additional Medicare IM given by:    Discharge Disposition:  [place]  Per UR Regulation:  Reviewed for med. necessity/level of care/duration of stay  If discussed at [place] of Stay Meetings, dates discussed:    Comments:  12/02/2013 Spoke with Alvis Lemmings of [place] yesterday 12/01/13 and orders faxed to that agency this pm to [phone] phone number is [phone]. CRoyal RN MPH  12/01/13 Met with pt who wishes to continue services with [facility]. Alvis Lemmings of [place] notified and will fax orders as soon as available. Jasmine Pang RN MPH, case manager, [phone]   Contact:  [place] Daughter [phone]   Fore,Tim Relative     [phone]   Jeanette Caprice     [phone]   Jetty Duhamel     [phone]

## 2013-12-02 NOTE — Progress Notes (Signed)
  Echocardiogram Echocardiogram Transesophageal has been performed.  [name], Sharod Petsch 12/02/2013, 9:14 AM

## 2013-12-02 NOTE — H&P (View-Only) (Signed)
[facility] PROGRESS NOTE  Maria LefevreVera [name] ZOX:[id number] DOB: 02-Apr-1933 DOA: 11/24/2013 PCP: Galvin ProfferHAGUE, IMRAN P, MD   Brief Narrative: 80 y.o. female with a history of hypertension, CAD, COPD, and ESRD on dialysis at the [facility], recently at [facility] 6/13-16 for pulmonary edema secondary to noncompliance with dialysis, who returned home from her treatment yesterday and had sudden onset of severe lower extremity pain, bilaterally from her pelvis to her knees, requiring transfer by EMS to [facility]. In the ED her CT showed only extensive atherosclerosis, and chest x-ray showed minimal perihilar infiltrates, unchanged from prior imaging, but she was found to be hypotensive with blood pressure as low as 60/15 and was transferred to [facility]. She was transferred to ICU and started on Levophed,given antibiotics for possible septic shock. She missed dialysis on Monday 6/22 secondary to temporary diarrhea, Both sets of blood cultures done today her have grown fully sensitive Enterococcus faecalis. One of 2 blood cultures here is also positive. Initially started on vancomycin and cefepime, cefepime discontinued 6/27. Non-vancomycin and gentamicin. 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 - not on medication at home- sugars normal -d/c sliding scale insulin  Hypothyroidism > TSH 6.38, improved from 2 weeks ago but still elevated - likely has sick euthyroid syndrome- no need to titrate medications yet  Encephalopathy Improved,  Consultants:  PC CM  Nephrology  Infectious disease  SIGNIFICANT EVENTS / STUDIES:  CT head 6/23 >> no acute abnormalities  CT abd/pelvis 6/23 >> renal atrophy, rectal wall thickening ? Significance, extensive athlerosclerosis  CXR 6/23 [facility]([place]) >> cardiomegaly, pacer, minimal perihilar infiltrates w little change from priors  TTE 6/25 >> LVH, mod-to-severe decrease LV fxn, severe TR and moderate MR, no clear-cut vegetations   LINES / TUBES:  L femoral CVC 6/23 >> out    CULTURES:  Blood 6/23 [facility]([place]) >> GPC's >> enterococcus >>  Blood 6/24 ([facility]) >> GPC chains >> enterococcus ss ampiillin   Antibiotics: Anti-infectives   Start     Dose/Rate Route Frequency Ordered Stop   12/01/13 1430  ampicillin (OMNIPEN) 2 g in sodium chloride 0.9 % 50 mL IVPB     2 g 150 mL/hr over 20 Minutes Intravenous Every 12 hours 12/01/13 1315     12/01/13 1200  gentamicin (GARAMYCIN) IVPB 60 mg     60 mg 100 mL/hr over 30 Minutes Intravenous Every Maria-W-F (Hemodialysis) 11/29/13 1250     12/01/13 1200  vancomycin (VANCOCIN) IVPB 750 mg/150 ml premix  Status:  Discontinued     750 mg 150 mL/hr over 60 Minutes Intravenous Every Maria-W-F (Hemodialysis) 12/01/13 0820 12/01/13 1315   11/29/13 1200  gentamicin (GARAMYCIN) 40 mg in dextrose 5 % 50 mL IVPB  Status:  Discontinued     40 mg 102 mL/hr over 30 Minutes Intravenous Every Maria-W-F (Hemodialysis) 11/27/13 1453 11/29/13 1250  11/27/13 1600  gentamicin (GARAMYCIN) IVPB 80 mg     80 mg 100 mL/hr over 30 Minutes Intravenous  Once 11/27/13 1452 11/27/13 1617   11/26/13 1800  ceFEPIme (MAXIPIME) 2 g in dextrose 5 % 50 mL IVPB  Status:   Discontinued     2 g 100 mL/hr over 30 Minutes Intravenous Every Maria-W-F (1800) 11/25/13 1154 11/27/13 1400   11/26/13 1200  vancomycin (VANCOCIN) 500 mg in sodium chloride 0.9 % 100 mL IVPB  Status:  Discontinued     500 mg 100 mL/hr over 60 Minutes Intravenous Every Maria-W-F (Hemodialysis) 11/25/13 1154 12/01/13 0820   11/24/13 0230  vancomycin (VANCOCIN) IVPB 1000 mg/200 mL premix     1,000 mg 200 mL/hr over 60 Minutes Intravenous  Once 11/24/13 0222 11/24/13 0419   11/24/13 0230  ceFEPIme (MAXIPIME) 2 g in dextrose 5 % 50 mL IVPB     2 g 100 mL/hr over 30 Minutes Intravenous  Once 11/24/13 0222 11/24/13 0349         Objective: Filed Vitals:   12/01/13 0930 12/01/13 1018 12/01/13 1106 12/01/13 1451  BP: 90/48 108/51 121/48   Pulse: 75 75    Temp:  98.3 F (36.8 C) 97.8 F (36.6 C)   TempSrc:  Oral Oral   Resp:  23 22   Height:      Weight:  47.6 kg (104 lb 15 oz)    SpO2:  100%  98%    Intake/Output Summary (Last 24 hours) at 12/01/13 1624 Last data filed at 12/01/13 1300  Gross per 24 hour  Intake    120 ml  Output   3301 ml  Net  -3181 ml    Exam:  General: She is upset about being in the hospital but otherwise in no distress  Skin: Scattered ecchymoses on her arms and legs. No splinter or conjunctival hemorrhages noted  Lungs: Clear  Cor: Regular S1 and S2. 2/6 systolic murmur heard at left lower sternal border  Chest wall: No abnormality noted around right anterior chest AICD  Abdomen: Soft and nontender  Joints and extremities: No acute abnormalities noted    Data Reviewed: Basic Metabolic Panel:  Recent Labs Lab 11/25/13 0434 11/26/13 0722 11/27/13 0430 11/29/13 0720 11/30/13 0523 12/01/13 0733  NA 136* 135* 139 136* 139 140  K 4.6 5.3 4.4 5.3 4.6 5.0  CL 99 97 99 95* 97 101  CO2 23 22 27 26 28 24   GLUCOSE 81 75 76 83 78 90  BUN 38* 48* 22 38* 21 30*  CREATININE 4.20* 5.01* 3.06* 5.07* 3.37* 4.75*  CALCIUM 8.8 10.1 10.1 9.5 8.7 8.8  MG 1.7   --  1.7  --   --   --   PHOS 4.8* 5.3* 3.0 4.4  --  4.2    Liver Function Tests:  Recent Labs Lab 11/26/13 0722 11/29/13 0720 12/01/13 0733  ALBUMIN 2.1* 2.1* 2.2*   No results found for this basename: LIPASE, AMYLASE,  in the last 168 hours No results found for this basename: AMMONIA,  in the last 168 hours  CBC:  Recent Labs Lab 11/25/13 0434 11/26/13 0722 11/29/13 0720 12/01/13 0733  WBC 8.3 7.7 5.4 6.7  HGB 8.3* 8.6* 8.9* 9.4*  HCT 26.4* 27.4* 28.5* 30.5*  MCV 101.5* 103.4* 104.4* 106.6*  PLT 127* 130* 174 204    Cardiac Enzymes: No results found for this basename: CKTOTAL, CKMB, CKMBINDEX, TROPONINI,  in the last 168 hours BNP (last 3 results)  No results found for this basename: PROBNP,  in the last 8760 hours   CBG:  Recent Labs Lab 11/30/13 0750 11/30/13 1147 11/30/13 1734 11/30/13 2107 12/01/13 1124  GLUCAP 81 101* 98 113* 74    Recent Results (from the past 240 hour(s))  MRSA PCR SCREENING     Status: None   Collection Time    11/24/13 12:53 AM      Result Value Ref Range Status   MRSA by PCR NEGATIVE  NEGATIVE Final   Comment:            The GeneXpert MRSA Assay (FDA     approved for NASAL specimens     only), is one component of a     comprehensive MRSA colonization     surveillance program. It is not     intended to diagnose MRSA     infection nor to guide or     monitor treatment for     MRSA infections.  CULTURE, BLOOD (ROUTINE X 2)     Status: None   Collection Time    11/24/13  2:00 AM      Result Value Ref Range Status   Specimen Description BLOOD RIGHT HAND   Final   Special Requests BOTTLES DRAWN AEROBIC ONLY 1CC   Final   Culture  Setup Time     Final   Value: 11/24/2013 08:19     Performed at [facility]   Culture     Final   Value: ENTEROCOCCUS SPECIES     Note: COMBINATION THERAPY OF HIGH DOSE AMPICILLIN OR VANCOMYCIN, PLUS AN AMINOGLYCOSIDE, IS USUALLY INDICATED FOR SERIOUS ENTEROCOCCAL INFECTIONS.     Note:  Gram Stain Report Called to,Read Back By and Verified With: SARA GROCE 11/26/13 0900 BY SMITHERSJ     Performed at [facility]   Report Status 11/28/2013 FINAL   Final   Organism ID, Bacteria ENTEROCOCCUS SPECIES   Final  CULTURE, BLOOD (ROUTINE X 2)     Status: None   Collection Time    11/24/13  2:31 AM      Result Value Ref Range Status   Specimen Description BLOOD RIGHT THUMB   Final   Special Requests BOTTLES DRAWN AEROBIC ONLY 3CC   Final   Culture  Setup Time     Final   Value: 11/24/2013 08:20     Performed at [facility]   Culture     Final   Value: NO GROWTH 5 DAYS     Performed at [facility]   Report Status 11/30/2013 FINAL   Final  CULTURE, BLOOD (ROUTINE X 2)     Status: None   Collection Time    11/27/13  4:30 PM      Result Value Ref Range Status   Specimen Description BLOOD RIGHT HAND   Final   Special Requests BOTTLES DRAWN AEROBIC ONLY 10 CC   Final   Culture  Setup Time     Final   Value: 11/28/2013 02:23     Performed at [facility]   Culture     Final   Value:        BLOOD CULTURE RECEIVED NO GROWTH TO DATE CULTURE WILL BE HELD FOR 5 DAYS BEFORE ISSUING A FINAL NEGATIVE REPORT     Performed at [facility]   Report Status PENDING   Incomplete  CULTURE, BLOOD (ROUTINE X 2)     Status: None   Collection Time  11/27/13  4:30 PM      Result Value Ref Range Status   Specimen Description BLOOD RIGHT ARM   Final   Special Requests BOTTLES DRAWN AEROBIC ONLY 9.5 CC   Final   Culture  Setup Time     Final   Value: 11/28/2013 02:23     Performed at [facility]   Culture     Final   Value:        BLOOD CULTURE RECEIVED NO GROWTH TO DATE CULTURE WILL BE HELD FOR 5 DAYS BEFORE ISSUING A FINAL NEGATIVE REPORT     Performed at [facility]   Report Status PENDING   Incomplete     Studies: Ir Pta Venous Left  11/16/2013   CLINICAL DATA:  80 year old female with end-stage renal disease on hemodialysis  via a left upper extremity brachiocephalic arteriovenous fistula. The fistula was created approximately 12 years ago. She has been using this access for the last 4- 5 years. She has been suffering with prolonged (45 min) bleeding times following dialysis concerning for stenosis of the draining or central veins.  EXAM: IR LEFT SHUNTOGRAM/FISTULAGRAM; PTA VENOUS  Date: 11/16/2013  PROCEDURE: 1. Diagnostic fistulogram 2. Angioplasty of tandem stenoses of the draining vein 3. Completion fistulogram Interventional Radiologist:  Sterling BigHeath K. McCullough, MD  ANESTHESIA/SEDATION: None required  FLUOROSCOPY TIME:  1 minute 24 seconds  CONTRAST:  50mL OMNIPAQUE IOHEXOL 300 MG/ML  SOLN  TECHNIQUE: Informed consent was obtained from the patient following explanation of the procedure, risks, benefits and alternatives. The patient understands, agrees and consents for the procedure. All questions were addressed. A time out was performed.  Maximal barrier sterile technique utilized including caps, mask, sterile gowns, sterile gloves, large sterile drape, hand hygiene, and Betadine skin prep.  The fistula was accessed percutaneously adjacent to the arterial anastomosis with an 18 gauge angiocatheter. A diagnostic fistulogram was performed. 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 Chest central to this in the mid arm and there is a segment of a peripherally calcified vein with tandem stenoses. The more peripheral stenosis is moderately high grade (60- 70%) the second stenosis is more web-like and approximately 50% diameter stenosis. The cephalic arch and central veins are widely patent. The arterial anastomosis is widely patent.  The risks, benefits and alternatives to angioplasty of the tandem stenotic venous segment were described to the patient. She understands and wishes to proceed. Therefore, a Bentson wire was advanced into the central veins. The angiocatheter was  exchanged over the wire for a working 6 [place] vascular sheath. A 6 x 40 mm Conquest balloon was positioned across the tandem stenoses and inflated to 20 atmospheres with full effacement of the balloon. The inflation was held for 2 min.  The balloon was deflated and removed. Follow-up fistulogram demonstrates near complete resolution of the tandem stenoses. The more severe stenosis demonstrates an approximate 10% residual narrowing. The more moderate stenosis demonstrates no residual narrowing. There is brisk flow.  The vascular sheath was removed and hemostasis was attained with the assistance of a 2-0 nylon pursestring suture. The patient tolerated the procedure very well.  IMPRESSION: 1. Diagnostic fistulogram demonstrates tandem stenoses in the mid aspect of the draining cephalic vein of the left upper extremity brachiocephalic arteriovenous fistula. 2. Angioplasty of both stenoses to 6 mm with excellent angiographic result. Signed,  Sterling BigHeath K. McCullough, MD  [facility]  [facility]   Electronically  Signed   By: Malachy Moan Maria.D.   On: 11/16/2013 17:07   Dg Chest Port 1 View  11/25/2013   CLINICAL DATA:  Hypoxemia  EXAM: PORTABLE CHEST - 1 VIEW  COMPARISON:  11/23/2013  FINDINGS: Diffuse mild bilateral interstitial thickening. No pleural effusion or pneumothorax. Enlargement of the central pulmonary vasculature. Stable cardiomegaly. Prior CABG. Dual lead AICD. Unremarkable osseous structures.  The osseous structures are unremarkable.  IMPRESSION: Mild pulmonary venous congestion.   Electronically Signed   By: Elige Ko   On: 11/25/2013 18:56   Dg Chest Port 1 View  11/14/2013   CLINICAL DATA:  Shortness of breath.  Cough.  EXAM: PORTABLE CHEST - 1 VIEW  COMPARISON:  Chest x-ray 11/13/2013.  FINDINGS: Cardiomegaly with pulmonary vascular prominence and interstitial prominence present consistent with congestive heart failure with pulmonary interstitial  edema. Pneumonitis cannot be excluded. Cardiac pacer with lead tips in right atrium and right ventricle. Prior CABG. No pneumothorax. No acute bony abnormality.  IMPRESSION: Congestive heart failure with pulmonary interstitial edema.   Electronically Signed   By: Maisie Fus  Register   On: 11/14/2013 14:47   Dg Abd Portable 2v  11/14/2013   CLINICAL DATA:  Abdominal pain.  Constipation .  EXAM: PORTABLE ABDOMEN - 2 VIEW  COMPARISON:  CT 06/29/2013  FINDINGS: Soft tissue structures are unremarkable. Moderate amount of stool noted: Marland Kitchen The gas pattern is nonspecific. Surgical clips right upper quadrant. Aortoiliac and visceral atherosclerotic vascular disease. Bilateral iliac stents are noted. Pelvic phleboliths. Degenerative changes lumbar spine and both hips. Cardiomegaly. Cardiac pacer.  IMPRESSION: 1. Moderate amount of stool in colon.  No bowel distention. 2. Aortoiliac atherosclerotic vascular disease. Bilateral iliac stents are present. Visceral atherosclerotic vascular disease. 3. Cholecystectomy. 4. Cardiomegaly.  Cardiac pacer.   Electronically Signed   By: Maisie Fus  Register   On: 11/14/2013 08:04   Ir Shuntogram/ Fistulagram Left Mod Sed  11/16/2013   CLINICAL DATA:  80 year old female with end-stage renal disease on hemodialysis via a left upper extremity brachiocephalic arteriovenous fistula. The fistula was created approximately 12 years ago. She has been using this access for the last 4- 5 years. She has been suffering with prolonged (45 min) bleeding times following dialysis concerning for stenosis of the draining or central veins.  EXAM: IR LEFT SHUNTOGRAM/FISTULAGRAM; PTA VENOUS  Date: 11/16/2013  PROCEDURE: 1. Diagnostic fistulogram 2. Angioplasty of tandem stenoses of the draining vein 3. Completion fistulogram Interventional Radiologist:  Sterling Big, MD  ANESTHESIA/SEDATION: None required  FLUOROSCOPY TIME:  1 minute 24 seconds  CONTRAST:  50mL OMNIPAQUE IOHEXOL 300 MG/ML  SOLN  TECHNIQUE:  Informed consent was obtained from the patient following explanation of the procedure, risks, benefits and alternatives. The patient understands, agrees and consents for the procedure. All questions were addressed. A time out was performed.  Maximal barrier sterile technique utilized including caps, mask, sterile gowns, sterile gloves, large sterile drape, hand hygiene, and Betadine skin prep.  The fistula was accessed percutaneously adjacent to the arterial anastomosis with an 18 gauge angiocatheter. A diagnostic fistulogram was performed. 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 Chest central to this in the mid arm and there is a segment of a peripherally calcified vein with tandem stenoses. The more peripheral stenosis is moderately high grade (60- 70%) the second stenosis is more web-like and approximately 50% diameter stenosis. The cephalic arch and central veins are widely patent. The arterial anastomosis is widely patent.  The risks, benefits and alternatives to angioplasty of the tandem stenotic venous segment were described to the patient. She understands and wishes to proceed. Therefore, a Bentson wire was advanced into the central veins. The angiocatheter was exchanged over the wire for a working 6 [place] vascular sheath. A 6 x 40 mm Conquest balloon was positioned across the tandem stenoses and inflated to 20 atmospheres with full effacement of the balloon. The inflation was held for 2 min.  The balloon was deflated and removed. Follow-up fistulogram demonstrates near complete resolution of the tandem stenoses. The more severe stenosis demonstrates an approximate 10% residual narrowing. The more moderate stenosis demonstrates no residual narrowing. There is brisk flow.  The vascular sheath was removed and hemostasis was attained with the assistance of a 2-0 nylon pursestring suture. The patient tolerated the procedure very well.   IMPRESSION: 1. Diagnostic fistulogram demonstrates tandem stenoses in the mid aspect of the draining cephalic vein of the left upper extremity brachiocephalic arteriovenous fistula. 2. Angioplasty of both stenoses to 6 mm with excellent angiographic result. Signed,  Sterling Big, MD  [facility]  [facility]   Electronically Signed   By: Malachy Moan Maria.D.   On: 11/16/2013 17:07    Scheduled Meds: . ampicillin (OMNIPEN) IV  2 g Intravenous Q12H  . antiseptic oral rinse  15 mL Mouth Rinse BID  . aspirin EC  81 mg Oral Daily  . atorvastatin  80 mg Oral Daily  . cinacalcet  60 mg Oral Q breakfast  . darbepoetin (ARANESP) injection - DIALYSIS  100 mcg Intravenous Q Mon-HD  . docusate sodium  200 mg Oral BID  . feeding supplement (RESOURCE BREEZE)  1 Container Oral BID BM  . ferric gluconate (FERRLECIT/NULECIT) IV  62.5 mg Intravenous Q Wed-HD  . folic acid  0.5 mg Oral Daily  . gentamicin  60 mg Intravenous Q Maria,W,F-HD  . heparin  5,000 Units Subcutaneous 3 times per day  . insulin aspart  0-5 Units Subcutaneous QHS  . insulin aspart  0-9 Units Subcutaneous TID WC  . ipratropium-albuterol  3 mL Nebulization Q6H  . levothyroxine  150 mcg Oral QAC breakfast  . pantoprazole  40 mg Oral Daily   Continuous Infusions: . sodium chloride Stopped (11/27/13 0746)       Time spent: 40 minutes   [facility]  [facility] Pager [email or web address], password [facility] 12/01/2013, 4:24 PM  LOS: 7 days

## 2013-12-03 ENCOUNTER — Encounter (HOSPITAL_COMMUNITY): Payer: Self-pay | Admitting: Cardiology

## 2013-12-03 DIAGNOSIS — Z5189 Encounter for other specified aftercare: Secondary | ICD-10-CM

## 2013-12-03 DIAGNOSIS — R6521 Severe sepsis with septic shock: Secondary | ICD-10-CM

## 2013-12-03 DIAGNOSIS — R6520 Severe sepsis without septic shock: Secondary | ICD-10-CM

## 2013-12-03 DIAGNOSIS — A419 Sepsis, unspecified organism: Secondary | ICD-10-CM

## 2013-12-03 LAB — HIV-1 RNA QUANT-NO REFLEX-BLD

## 2013-12-03 LAB — GLUCOSE, CAPILLARY: GLUCOSE-CAPILLARY: 77 mg/dL (ref 70–99)

## 2013-12-03 MED ORDER — ACETAMINOPHEN 325 MG PO TABS: 650.0000 mg | ORAL_TABLET | Freq: Four times a day (QID) | ORAL | Status: AC | PRN

## 2013-12-03 MED ORDER — VANCOMYCIN HCL IN DEXTROSE 750-5 MG/150ML-% IV SOLN: 750.0000 mg | INTRAVENOUS | Status: DC

## 2013-12-03 MED ORDER — GENTAMICIN IN SALINE 1.2-0.9 MG/ML-% IV SOLN: 60.0000 mg | INTRAVENOUS | Status: DC

## 2013-12-03 MED ORDER — VANCOMYCIN HCL 1000 MG IV SOLR: 750.0000 mg | INTRAVENOUS | Status: AC

## 2013-12-03 MED ORDER — GENTAMICIN IN SALINE 1.2-0.9 MG/ML-% IV SOLN: 60.0000 mg | INTRAVENOUS | Status: AC

## 2013-12-03 NOTE — Progress Notes (Signed)
Pt arrived at HD unit at 0650am. I noticed wet blood on her sheets, gown and lift pad on bed. There was a skin tear on her right elbow. I notified Rella Larvemmanuel, her floor nurse on 6E.  Placed a pad over the skin tear.

## 2013-12-03 NOTE — Progress Notes (Signed)
80yo female on vancomycin for bacteremia/endocarditis.  Pre-HD vanc level on 7/1 was subtherapeutic at 9.6 and dose was increased to 750mg  IV after each HD.  Would recommend continuing vancomycin 750mg  IV after each HD.  Pt will be due for random level (pre-HD) ~7/8 to ensure this dose remains appropriate.  Vernard GamblesVeronda Lequisha Cammack, PharmD, BCPS 12/03/2013 7:47 AM

## 2013-12-03 NOTE — Progress Notes (Signed)
12/03/13 Contacted [facility],  [place] office, spoke with Rosalita ChessmanSuzanne, who stated that they will provide an aide for the patient and that the RN and PT will come from their [facility], tel # [phone]. She will fax patient info to that office. Contacted [facility], spoke with Eber Jonesarolyn who stated that they will provide HHPT and HHRN starting  12/06/13. Jacquelynn CreeMary Khalid Lacko RN, [name], CCM

## 2013-12-03 NOTE — Progress Notes (Signed)
PT Cancellation Note  Patient Details Name: Maria LefevreVera M Tsang MRN: [id number] DOB: 1932-06-22   Cancelled Treatment:    Reason Eval/Treat Not Completed: Patient declined, no reason specified;Other (comment) (pt. expects to be leaving the hospital shortly)   Ferman HammingBlankenship, Lavita Pontius B 12/03/2013, 4:05 PM Weldon PickingSusan Brunette Lavalle PT Acute Rehab Services [phone] Bee[place] [phone]

## 2013-12-03 NOTE — Discharge Summary (Addendum)
Physician Discharge Summary  Maria LefevreVera M [name] WUJ:[id number] DOB: Nov 16, 1932 DOA: 11/24/2013  PCP: Galvin ProfferHAGUE, IMRAN P, MD  Admit date: 11/24/2013 Discharge date: 12/03/2013  Time spent: >45 minutes  Recommendations for Outpatient Follow-up:  1. Vancomycin random level on 7/8 2. Home with home health PT  Discharge Diagnoses:  Principal Problem:   Bacteremia due to Enterococcus feacalis  Active Problems:   Septic shock   Coronary artery disease   COPD (chronic obstructive pulmonary disease)   ESRD (end stage renal disease) on dialysis   Unspecified hypothyroidism   Abdominal pain, left upper quadrant   AICD (automatic cardioverter/defibrillator) present   Tricuspid valve regurgitation due to infection   Bacterial endocarditis   Protein-calorie malnutrition, severe   Discharge Condition: stable  Diet recommendation: renal diet  Filed Weights   12/02/13 1950 12/03/13 0657 12/03/13 0954  Weight: 48.444 kg (106 lb 12.8 oz) 49.2 kg (108 lb 7.5 oz) 48.1 kg (106 lb 0.7 oz)    History of present illness:  80 y.o. female with a history of hypertension, CAD, COPD, and ESRD on dialysis [facility], [facility] at [facility]16 for pulmonary edema secondary to noncompliance with dialysis, who returned home from her treatment yesterday and had sudden onset of severe lower extremity pain, bilaterally from her pelvis to her knees, requiring [facility]. In the ED her CT showed only extensive atherosclerosis, and chest x-ray showed minimal perihilar infiltrates, unchanged from prior imaging, but she was found to be hypotensive with blood pressure as low as 60/15 and was transferred to [facility]. She was transferred to ICU and started on Levophed,given antibiotics for possible septic shock. She missed dialysis on Monday 6/22 secondary to temporary diarrhea,  Blood cx from 6/23- revealed E faecalis and One of 2 blood cultures from 6/24 growing pan-sensitive E.  Faecalis.  SIGNIFICANT EVENTS / STUDIES:  CT head 6/23 >> no acute abnormalities  CT abd/pelvis 6/23 >> renal atrophy, rectal wall thickening ? Significance, extensive athlerosclerosis  CXR 6/23 [facility] >> cardiomegaly, pacer, minimal perihilar infiltrates w little change from priors  TTE 6/25 >> LVH, mod-to-severe decrease LV fxn, severe TR and moderate MR, no clear-cut vegetations   CULTURES:  Blood 6/23 [facility]([place]) >> GPC's >> enterococcus >>  Blood 6/24 ([facility]) >> GPC chains >> enterococcus    Hospital Course:  Septic/hypovolemic shock/enterococcal bacteremia  presumed right-sided endocarditis involving her AICD  Infectious disease managing antibiotics- will need Vanc and Gent with dialysis through 7/24 Pre-HD vanc level on 7/1 was subtherapeutic at 9.6 and dose was increased to 750mg  IV after each HD- repeat level on 7/8 Echo 6/25 with TR and presumed right-side endocarditis involving AICD- TEE negative for vegetations  Off vasopressors, transferred to telemetry on 6/30  - it was decided not to remove her AICD and continue to treat with antibiotics for 4 wks total - repeat blood cx from 6/27 are negative  Advanced COPD  Home oxygen dependent on 2 L  Continue nebulizer treatments   End-stage renal disease - [facility] Hemodialysis Monday Wednesday Friday - Vit D and Phoslo on hold per Dr Arlean HoppingSchertz-    Hypertension  BP elevated this AM- will resume Hydralazine today   Chronic Ischemic CHF  Pacemaker  Systolic murmur c/w tricuspid regurgitation 2-3/6 - severe by Echo   Diabetes mellitus??  - not on medication at home- sugars normal  -d/c sliding scale insulin   Hypothyroidism  > TSH 6.38, improved from 2 weeks ago but still  elevated - likely has sick euthyroid syndrome- no need to titrate medications yet   Severe TR and mod MR - fluid management with dialysis  Procedures: L femoral CVC 6/23   Consultations:  ID  Nephrology  Pulm critical care  Discharge  Exam: Filed Vitals:   12/03/13 1153  BP: 123/68  Pulse: 76  Temp: 97.8 F (36.6 C)  Resp: 15    General: alert, no distress, oriented x 3 Skin: Scattered ecchymoses on her arms and legs.  CVS: Regular S1 and S2. 2/6 systolic murmur heard at left lower sternal border  Chest wall: No abnormality noted around right anterior chest AICD  Abdomen: Soft and nontender  Lungs: CTA b/l    Discharge Instructions You were cared for by a hospitalist during your hospital stay. If you have any questions about your discharge medications or the care you received while you were in the hospital after you are discharged, you can call the unit and asked to speak with the hospitalist on call if the hospitalist that took care of you is not available. Once you are discharged, your primary care physician will handle any further medical issues. Please note that NO REFILLS for any discharge medications will be authorized once you are discharged, as it is imperative that you return to your primary care physician (or establish a relationship with a primary care physician if you do not have one) for your aftercare needs so that they can reassess your need for medications and monitor your lab values.     Medication List    STOP taking these medications       cholecalciferol 1000 UNITS tablet  Commonly known as:  VITAMIN D     PHOSLO 667 MG capsule  Generic drug:  calcium acetate      TAKE these medications       acetaminophen 325 MG tablet  Commonly known as:  TYLENOL  Take 2 tablets (650 mg total) by mouth every 6 (six) hours as needed for mild pain, fever or headache.     ALPRAZolam 0.5 MG tablet  Commonly known as:  XANAX  Take 0.5 mg by mouth 3 (three) times daily as needed for anxiety.     aspirin EC 81 MG tablet  Take 81 mg by mouth daily.     atorvastatin 80 MG tablet  Commonly known as:  LIPITOR  Take 80 mg by mouth daily.     cinacalcet 60 MG tablet  Commonly known as:  SENSIPAR  Take  60 mg by mouth daily.     dextromethorphan 30 MG/5ML liquid  Commonly known as:  DELSYM  Take 30 mg by mouth every 12 (twelve) hours as needed for cough.     docusate sodium 100 MG capsule  Commonly known as:  COLACE  Take 100 mg by mouth daily.     feeding supplement (RESOURCE BREEZE) Liqd  Take 1 Container by mouth 2 (two) times daily between meals.     Fiber (Guar Gum) Chew  Chew 1 each by mouth daily.     folic acid 400 MCG tablet  Commonly known as:  FOLVITE  Take 400 mcg by mouth daily.     furosemide 80 MG tablet  Commonly known as:  LASIX  Take 80 mg by mouth 2 (two) times daily.     gentamicin 1.2-0.9 MG/ML-%  Commonly known as:  GARAMYCIN  Inject 50 mLs (60 mg total) into the vein every Monday, Wednesday, and Friday with hemodialysis.  hydrALAZINE 25 MG tablet  Commonly known as:  APRESOLINE  Take 25 mg by mouth 3 (three) times daily.     ipratropium 0.02 % nebulizer solution  Commonly known as:  ATROVENT  Take 0.5 mg by nebulization 2 (two) times daily as needed for wheezing or shortness of breath.     levothyroxine 150 MCG tablet  Commonly known as:  SYNTHROID, LEVOTHROID  Take 150 mcg by mouth daily before breakfast.     nitroGLYCERIN 0.4 MG SL tablet  Commonly known as:  NITROSTAT  Place 0.4 mg under the tongue every 5 (five) minutes as needed for chest pain.     olopatadine 0.1 % ophthalmic solution  Commonly known as:  PATANOL  Place 1 drop into both eyes 2 (two) times daily. Affected eye     oxyCODONE-acetaminophen 7.5-325 MG per tablet  Commonly known as:  PERCOCET  Take 1 tablet by mouth every 8 (eight) hours as needed for pain.     pramoxine-mineral oil-zinc 1-12.5 % rectal ointment  Commonly known as:  TUCKS  Place rectally 2 (two) times daily as needed for itching or hemorrhoids.     promethazine 25 MG tablet  Commonly known as:  PHENERGAN  Take 25 mg by mouth every 6 (six) hours as needed for nausea or vomiting.     temazepam 30 MG  capsule  Commonly known as:  RESTORIL  Take 30 mg by mouth at bedtime as needed for sleep.     tiotropium 18 MCG inhalation capsule  Commonly known as:  SPIRIVA  Place 1 capsule (18 mcg total) into inhaler and inhale daily.     vancomycin 750 mg in sodium chloride 0.9 % 150 mL  Inject 750 mg into the vein every Monday, Wednesday, and Friday with hemodialysis.       Allergies  Allergen Reactions  . Ivp Dye [Iodinated Diagnostic Agents] Swelling  . Lisinopril Swelling  . Iohexol Itching and Swelling           The results of significant diagnostics from this hospitalization (including imaging, microbiology, ancillary and laboratory) are listed below for reference.    Significant Diagnostic Studies: Ir Pta Venous Left  11/16/2013   CLINICAL DATA:  80 year old female with end-stage renal disease on hemodialysis via a left upper extremity brachiocephalic arteriovenous fistula. The fistula was created approximately 12 years ago. She has been using this access for the last 4- 5 years. She has been suffering with prolonged (45 min) bleeding times following dialysis concerning for stenosis of the draining or central veins.  EXAM: IR LEFT SHUNTOGRAM/FISTULAGRAM; PTA VENOUS  Date: 11/16/2013  PROCEDURE: 1. Diagnostic fistulogram 2. Angioplasty of tandem stenoses of the draining vein 3. Completion fistulogram Interventional Radiologist:  Sterling Big, MD  ANESTHESIA/SEDATION: None required  FLUOROSCOPY TIME:  1 minute 24 seconds  CONTRAST:  50mL OMNIPAQUE IOHEXOL 300 MG/ML  SOLN  TECHNIQUE: Informed consent was obtained from the patient following explanation of the procedure, risks, benefits and alternatives. The patient understands, agrees and consents for the procedure. All questions were addressed. A time out was performed.  Maximal barrier sterile technique utilized including caps, mask, sterile gowns, sterile gloves, large sterile drape, hand hygiene, and Betadine skin prep.  The fistula  was accessed percutaneously adjacent to the arterial anastomosis with an 18 gauge angiocatheter. A diagnostic fistulogram was performed. 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 Chest central to this in the mid arm and there is  a segment of a peripherally calcified vein with tandem stenoses. The more peripheral stenosis is moderately high grade (60- 70%) the second stenosis is more web-like and approximately 50% diameter stenosis. The cephalic arch and central veins are widely patent. The arterial anastomosis is widely patent.  The risks, benefits and alternatives to angioplasty of the tandem stenotic venous segment were described to the patient. She understands and wishes to proceed. Therefore, a Bentson wire was advanced into the central veins. The angiocatheter was exchanged over the wire for a working 6 [place] vascular sheath. A 6 x 40 mm Conquest balloon was positioned across the tandem stenoses and inflated to 20 atmospheres with full effacement of the balloon. The inflation was held for 2 min.  The balloon was deflated and removed. Follow-up fistulogram demonstrates near complete resolution of the tandem stenoses. The more severe stenosis demonstrates an approximate 10% residual narrowing. The more moderate stenosis demonstrates no residual narrowing. There is brisk flow.  The vascular sheath was removed and hemostasis was attained with the assistance of a 2-0 nylon pursestring suture. The patient tolerated the procedure very well.  IMPRESSION: 1. Diagnostic fistulogram demonstrates tandem stenoses in the mid aspect of the draining cephalic vein of the left upper extremity brachiocephalic arteriovenous fistula. 2. Angioplasty of both stenoses to 6 mm with excellent angiographic result. Signed,  Sterling Big, MD  [facility]  [facility]   Electronically Signed   By: Malachy Moan  M.D.   On: 11/16/2013 17:07   Dg Chest Port 1 View  11/25/2013   CLINICAL DATA:  Hypoxemia  EXAM: PORTABLE CHEST - 1 VIEW  COMPARISON:  11/23/2013  FINDINGS: Diffuse mild bilateral interstitial thickening. No pleural effusion or pneumothorax. Enlargement of the central pulmonary vasculature. Stable cardiomegaly. Prior CABG. Dual lead AICD. Unremarkable osseous structures.  The osseous structures are unremarkable.  IMPRESSION: Mild pulmonary venous congestion.   Electronically Signed   By: Elige Ko   On: 11/25/2013 18:56   Dg Chest Port 1 View  11/14/2013   CLINICAL DATA:  Shortness of breath.  Cough.  EXAM: PORTABLE CHEST - 1 VIEW  COMPARISON:  Chest x-ray 11/13/2013.  FINDINGS: Cardiomegaly with pulmonary vascular prominence and interstitial prominence present consistent with congestive heart failure with pulmonary interstitial edema. Pneumonitis cannot be excluded. Cardiac pacer with lead tips in right atrium and right ventricle. Prior CABG. No pneumothorax. No acute bony abnormality.  IMPRESSION: Congestive heart failure with pulmonary interstitial edema.   Electronically Signed   By: Maisie Fus  Register   On: 11/14/2013 14:47   Dg Abd Portable 2v  11/14/2013   CLINICAL DATA:  Abdominal pain.  Constipation .  EXAM: PORTABLE ABDOMEN - 2 VIEW  COMPARISON:  CT 06/29/2013  FINDINGS: Soft tissue structures are unremarkable. Moderate amount of stool noted: Marland Kitchen The gas pattern is nonspecific. Surgical clips right upper quadrant. Aortoiliac and visceral atherosclerotic vascular disease. Bilateral iliac stents are noted. Pelvic phleboliths. Degenerative changes lumbar spine and both hips. Cardiomegaly. Cardiac pacer.  IMPRESSION: 1. Moderate amount of stool in colon.  No bowel distention. 2. Aortoiliac atherosclerotic vascular disease. Bilateral iliac stents are present. Visceral atherosclerotic vascular disease. 3. Cholecystectomy. 4. Cardiomegaly.  Cardiac pacer.   Electronically Signed   By: Maisie Fus  Register    On: 11/14/2013 08:04   Ir Shuntogram/ Fistulagram Left Mod Sed  11/16/2013   CLINICAL DATA:  80 year old female with end-stage renal disease on hemodialysis via a left upper extremity brachiocephalic arteriovenous fistula. The fistula was  created approximately 12 years ago. She has been using this access for the last 4- 5 years. She has been suffering with prolonged (45 min) bleeding times following dialysis concerning for stenosis of the draining or central veins.  EXAM: IR LEFT SHUNTOGRAM/FISTULAGRAM; PTA VENOUS  Date: 11/16/2013  PROCEDURE: 1. Diagnostic fistulogram 2. Angioplasty of tandem stenoses of the draining vein 3. Completion fistulogram Interventional Radiologist:  Sterling Big, MD  ANESTHESIA/SEDATION: None required  FLUOROSCOPY TIME:  1 minute 24 seconds  CONTRAST:  50mL OMNIPAQUE IOHEXOL 300 MG/ML  SOLN  TECHNIQUE: Informed consent was obtained from the patient following explanation of the procedure, risks, benefits and alternatives. The patient understands, agrees and consents for the procedure. All questions were addressed. A time out was performed.  Maximal barrier sterile technique utilized including caps, mask, sterile gowns, sterile gloves, large sterile drape, hand hygiene, and Betadine skin prep.  The fistula was accessed percutaneously adjacent to the arterial anastomosis with an 18 gauge angiocatheter. A diagnostic fistulogram was performed. 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 Chest central to this in the mid arm and there is a segment of a peripherally calcified vein with tandem stenoses. The more peripheral stenosis is moderately high grade (60- 70%) the second stenosis is more web-like and approximately 50% diameter stenosis. The cephalic arch and central veins are widely patent. The arterial anastomosis is widely patent.  The risks, benefits and alternatives to angioplasty of the tandem stenotic venous  segment were described to the patient. She understands and wishes to proceed. Therefore, a Bentson wire was advanced into the central veins. The angiocatheter was exchanged over the wire for a working 6 [place] vascular sheath. A 6 x 40 mm Conquest balloon was positioned across the tandem stenoses and inflated to 20 atmospheres with full effacement of the balloon. The inflation was held for 2 min.  The balloon was deflated and removed. Follow-up fistulogram demonstrates near complete resolution of the tandem stenoses. The more severe stenosis demonstrates an approximate 10% residual narrowing. The more moderate stenosis demonstrates no residual narrowing. There is brisk flow.  The vascular sheath was removed and hemostasis was attained with the assistance of a 2-0 nylon pursestring suture. The patient tolerated the procedure very well.  IMPRESSION: 1. Diagnostic fistulogram demonstrates tandem stenoses in the mid aspect of the draining cephalic vein of the left upper extremity brachiocephalic arteriovenous fistula. 2. Angioplasty of both stenoses to 6 mm with excellent angiographic result. Signed,  Sterling Big, MD  [facility]  [facility]   Electronically Signed   By: Malachy Moan M.D.   On: 11/16/2013 17:07    Microbiology: Recent Results (from the past 240 hour(s))  MRSA PCR SCREENING     Status: None   Collection Time    11/24/13 12:53 AM      Result Value Ref Range Status   MRSA by PCR NEGATIVE  NEGATIVE Final   Comment:            The GeneXpert MRSA Assay (FDA     approved for NASAL specimens     only), is one component of a     comprehensive MRSA colonization     surveillance program. It is not     intended to diagnose MRSA     infection nor to guide or     monitor treatment for     MRSA infections.  CULTURE, BLOOD (ROUTINE X 2)  Status: None   Collection Time    11/24/13  2:00 AM      Result Value Ref Range Status    Specimen Description BLOOD RIGHT HAND   Final   Special Requests BOTTLES DRAWN AEROBIC ONLY 1CC   Final   Culture  Setup Time     Final   Value: 11/24/2013 08:19     Performed at [facility]   Culture     Final   Value: ENTEROCOCCUS SPECIES     Note: COMBINATION THERAPY OF HIGH DOSE AMPICILLIN OR VANCOMYCIN, PLUS AN AMINOGLYCOSIDE, IS USUALLY INDICATED FOR SERIOUS ENTEROCOCCAL INFECTIONS.     Note: Gram Stain Report Called to,Read Back By and Verified With: SARA GROCE 11/26/13 0900 BY SMITHERSJ     Performed at [facility]   Report Status 11/28/2013 FINAL   Final   Organism ID, Bacteria ENTEROCOCCUS SPECIES   Final  CULTURE, BLOOD (ROUTINE X 2)     Status: None   Collection Time    11/24/13  2:31 AM      Result Value Ref Range Status   Specimen Description BLOOD RIGHT THUMB   Final   Special Requests BOTTLES DRAWN AEROBIC ONLY 3CC   Final   Culture  Setup Time     Final   Value: 11/24/2013 08:20     Performed at [facility]   Culture     Final   Value: NO GROWTH 5 DAYS     Performed at [facility]   Report Status 11/30/2013 FINAL   Final  CULTURE, BLOOD (ROUTINE X 2)     Status: None   Collection Time    11/27/13  4:30 PM      Result Value Ref Range Status   Specimen Description BLOOD RIGHT HAND   Final   Special Requests BOTTLES DRAWN AEROBIC ONLY 10 CC   Final   Culture  Setup Time     Final   Value: 11/28/2013 02:23     Performed at [facility]   Culture     Final   Value:        BLOOD CULTURE RECEIVED NO GROWTH TO DATE CULTURE WILL BE HELD FOR 5 DAYS BEFORE ISSUING A FINAL NEGATIVE REPORT     Performed at [facility]   Report Status PENDING   Incomplete  CULTURE, BLOOD (ROUTINE X 2)     Status: None   Collection Time    11/27/13  4:30 PM      Result Value Ref Range Status   Specimen Description BLOOD RIGHT ARM   Final   Special Requests BOTTLES DRAWN AEROBIC ONLY 9.5 CC   Final   Culture  Setup Time     Final    Value: 11/28/2013 02:23     Performed at [facility]   Culture     Final   Value:        BLOOD CULTURE RECEIVED NO GROWTH TO DATE CULTURE WILL BE HELD FOR 5 DAYS BEFORE ISSUING A FINAL NEGATIVE REPORT     Performed at [facility]   Report Status PENDING   Incomplete     Labs: Basic Metabolic Panel:  Recent Labs Lab 11/27/13 0430 11/29/13 0720 11/30/13 0523 12/01/13 0733  NA 139 136* 139 140  K 4.4 5.3 4.6 5.0  CL 99 95* 97 101  CO2 27 26 28 24   GLUCOSE 76 83 78 90  BUN 22 38* 21 30*  CREATININE  3.06* 5.07* 3.37* 4.75*  CALCIUM 10.1 9.5 8.7 8.8  MG 1.7  --   --   --   PHOS 3.0 4.4  --  4.2   Liver Function Tests:  Recent Labs Lab 11/29/13 0720 12/01/13 0733  ALBUMIN 2.1* 2.2*   No results found for this basename: LIPASE, AMYLASE,  in the last 168 hours No results found for this basename: AMMONIA,  in the last 168 hours CBC:  Recent Labs Lab 11/29/13 0720 12/01/13 0733  WBC 5.4 6.7  HGB 8.9* 9.4*  HCT 28.5* 30.5*  MCV 104.4* 106.6*  PLT 174 204   Cardiac Enzymes: No results found for this basename: CKTOTAL, CKMB, CKMBINDEX, TROPONINI,  in the last 168 hours BNP: BNP (last 3 results) No results found for this basename: PROBNP,  in the last 8760 hours CBG:  Recent Labs Lab 12/02/13 0740 12/02/13 1203 12/02/13 1705 12/02/13 1953 12/03/13 1149  GLUCAP 81 81 96 106* 77       SignedCalvert Cantor:  Amarise Lillo, MD [facility] 12/03/2013, 1:35 PM

## 2013-12-03 NOTE — Progress Notes (Signed)
Patient c/o fatigue and back pain post dialysis.  Stated she was anxious to go home today. Medicated with Xanax 0.25 and Percocet 1 tab with relief. Vital signs stable. Reviewed discharge instructions with patient, who verbalized understanding about medications, follow-up visit, and antibiotics during HD.  IV D/C'd, Telemetry D/C'd. Escorted to hospital entrance by staff where she was released into the care of her family. Quincey Nored K

## 2013-12-03 NOTE — Progress Notes (Signed)
[facility] Progress Note  Assessment/Plan: 1. 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 TEE neg for vegetation. Cardiology has seen. ID said OK to change to Vanc and Natasha BenceGent so she won't need a PICC line.  Pharm dosing recommending 750 mg Vanc after each HD, have ordered 2. ESRD - MWF -  3. Hypotension/volume - BP not high today. On hydralazine TID. Up goal 3 L with 2.2 off and she informed the RN that she is not running her full tmt.  Still has crackles, She is almost 5 kg above edw Refused to let goal be set higher than 3 L  4. Anemia - Hgb 9.4, improving on Aranesp 100 q Mon. Continue Weekly Fe 5. HPTH- Ca 8.8 (10.2 corrected). Cont low Ca bath, sensipar. Holding phoslo and vit D 6. Nutrition - Alb 2.2, diet liberalized, vitamin, supplements. Appreciate RD assistance. 7. DM - insulin per primary 8. Disp - needs central PICC for ampicilin - IF she agrees, would benefit by an extra HD tmt for UF on Saturday.   Sheffield SliderMartha B Bergman, PA-C [facility] Beeper [phone] 12/03/2013,9:30 AM  LOS: 9 days   Pt seen, examined, agree w assess/plan as above with additions as indicated.  Vinson Moselleob Keoni Havey MD pager [phone]    cell [phone] 12/03/2013, 1:45 PM     Subjective:   If I don't go home, I'm going to go crazy  Objective Filed Vitals:   12/03/13 0800 12/03/13 0830 12/03/13 0900 12/03/13 0929  BP: 117/65 101/56 100/58 102/51  Pulse: 85 75 75 73  Temp:      TempSrc:      Resp: 19 20 12 11   Height:      Weight:      SpO2: 100% 97% 100% 100%   Physical Exam General: chronically ill appearing elderly lady Heart: RRR Lungs: bilateral crackles Abdomen: soft NT Extremities: no LE edema; skin tear right elbow Dialysis Access: left AVF  Dialysis Orders: MWF @ [facility]  3.5h 44.5kg 2K/2Ca 350/A1.5 P2 Heparin- none AVF @ [facility]  Aranesp 40 mcg & Venofer 50 mg on Wed   Additional  Objective Labs: Basic Metabolic Panel:  Recent Labs Lab 11/27/13 0430 11/29/13 0720 11/30/13 0523 12/01/13 0733  NA 139 136* 139 140  K 4.4 5.3 4.6 5.0  CL 99 95* 97 101  CO2 27 26 28 24   GLUCOSE 76 83 78 90  BUN 22 38* 21 30*  CREATININE 3.06* 5.07* 3.37* 4.75*  CALCIUM 10.1 9.5 8.7 8.8  PHOS 3.0 4.4  --  4.2   Liver Function Tests:  Recent Labs Lab 11/29/13 0720 12/01/13 0733  ALBUMIN 2.1* 2.2*   CBC:  Recent Labs Lab 11/29/13 0720 12/01/13 0733  WBC 5.4 6.7  HGB 8.9* 9.4*  HCT 28.5* 30.5*  MCV 104.4* 106.6*  PLT 174 204   Blood Culture    Component Value Date/Time   SDES BLOOD RIGHT HAND 11/27/2013 1630   SDES BLOOD RIGHT ARM 11/27/2013 1630   SPECREQUEST BOTTLES DRAWN AEROBIC ONLY 10 CC 11/27/2013 1630   SPECREQUEST BOTTLES DRAWN AEROBIC ONLY 9.5 CC 11/27/2013 1630   CULT  Value:        BLOOD CULTURE RECEIVED NO GROWTH TO DATE CULTURE WILL BE HELD FOR 5 DAYS BEFORE ISSUING A FINAL NEGATIVE REPORT [facility] 11/27/2013 1630   CULT  Value:        BLOOD CULTURE RECEIVED  NO GROWTH TO DATE CULTURE WILL BE HELD FOR 5 DAYS BEFORE ISSUING A FINAL NEGATIVE REPORT Performed at [facility] 11/27/2013 1630   REPTSTATUS PENDING 11/27/2013 1630   REPTSTATUS PENDING 11/27/2013 1630   CBG:  Recent Labs Lab 12/01/13 2224 12/02/13 0740 12/02/13 1203 12/02/13 1705 12/02/13 1953  GLUCAP 91 81 81 96 106*   Medications:   . ampicillin (OMNIPEN) IV  2 g Intravenous Q12H  . antiseptic oral rinse  15 mL Mouth Rinse BID  . aspirin EC  81 mg Oral Daily  . atorvastatin  80 mg Oral Daily  . cinacalcet  60 mg Oral Q breakfast  . darbepoetin (ARANESP) injection - DIALYSIS  100 mcg Intravenous Q Mon-HD  . docusate sodium  200 mg Oral BID  . ferric gluconate (FERRLECIT/NULECIT) IV  62.5 mg Intravenous Q Wed-HD  . folic acid  0.5 mg Oral Daily  . gentamicin  60 mg Intravenous Q M,W,F-HD  . heparin  5,000 Units Subcutaneous 3 times per day  .  hydrALAZINE  25 mg Oral TID  . insulin aspart  0-5 Units Subcutaneous QHS  . insulin aspart  0-9 Units Subcutaneous TID WC  . levothyroxine  150 mcg Oral QAC breakfast  . olopatadine  1 drop Both Eyes BID  . pantoprazole  40 mg Oral Daily  . tiotropium  18 mcg Inhalation Daily

## 2013-12-04 LAB — CULTURE, BLOOD (ROUTINE X 2)
CULTURE: NO GROWTH
Culture: NO GROWTH

## 2014-01-05 ENCOUNTER — Inpatient Hospital Stay: Payer: Medicare HMO | Admitting: Internal Medicine

## 2014-02-09 ENCOUNTER — Inpatient Hospital Stay: Payer: Medicare HMO | Admitting: Internal Medicine

## 2014-03-15 ENCOUNTER — Encounter (HOSPITAL_COMMUNITY): Payer: Self-pay | Admitting: Emergency Medicine

## 2014-03-15 ENCOUNTER — Inpatient Hospital Stay (HOSPITAL_COMMUNITY)
Admission: EM | Admit: 2014-03-15 | Discharge: 2014-03-19 | DRG: 291 | Disposition: A | Discharge status: Home / Self Care | Payer: Medicare Other | Attending: Internal Medicine | Admitting: Internal Medicine

## 2014-03-15 ENCOUNTER — Emergency Department (HOSPITAL_COMMUNITY): Payer: Medicare Other

## 2014-03-15 DIAGNOSIS — I2510 Atherosclerotic heart disease of native coronary artery without angina pectoris: Secondary | ICD-10-CM | POA: Diagnosis present

## 2014-03-15 DIAGNOSIS — Z7982 Long term (current) use of aspirin: Secondary | ICD-10-CM

## 2014-03-15 DIAGNOSIS — Z888 Allergy status to other drugs, medicaments and biological substances status: Secondary | ICD-10-CM | POA: Diagnosis not present

## 2014-03-15 DIAGNOSIS — N186 End stage renal disease: Secondary | ICD-10-CM

## 2014-03-15 DIAGNOSIS — R1012 Left upper quadrant pain: Secondary | ICD-10-CM

## 2014-03-15 DIAGNOSIS — Z8711 Personal history of peptic ulcer disease: Secondary | ICD-10-CM | POA: Diagnosis not present

## 2014-03-15 DIAGNOSIS — I255 Ischemic cardiomyopathy: Secondary | ICD-10-CM | POA: Diagnosis present

## 2014-03-15 DIAGNOSIS — Z951 Presence of aortocoronary bypass graft: Secondary | ICD-10-CM | POA: Diagnosis not present

## 2014-03-15 DIAGNOSIS — J189 Pneumonia, unspecified organism: Secondary | ICD-10-CM

## 2014-03-15 DIAGNOSIS — J441 Chronic obstructive pulmonary disease with (acute) exacerbation: Secondary | ICD-10-CM | POA: Diagnosis present

## 2014-03-15 DIAGNOSIS — I5022 Chronic systolic (congestive) heart failure: Secondary | ICD-10-CM | POA: Diagnosis present

## 2014-03-15 DIAGNOSIS — Z992 Dependence on renal dialysis: Secondary | ICD-10-CM | POA: Diagnosis not present

## 2014-03-15 DIAGNOSIS — Z8673 Personal history of transient ischemic attack (TIA), and cerebral infarction without residual deficits: Secondary | ICD-10-CM | POA: Diagnosis not present

## 2014-03-15 DIAGNOSIS — Z9115 Patient's noncompliance with renal dialysis: Secondary | ICD-10-CM | POA: Diagnosis present

## 2014-03-15 DIAGNOSIS — Z90710 Acquired absence of both cervix and uterus: Secondary | ICD-10-CM

## 2014-03-15 DIAGNOSIS — Z91199 Patient's noncompliance with other medical treatment and regimen due to unspecified reason: Secondary | ICD-10-CM

## 2014-03-15 DIAGNOSIS — F419 Anxiety disorder, unspecified: Secondary | ICD-10-CM

## 2014-03-15 DIAGNOSIS — J810 Acute pulmonary edema: Secondary | ICD-10-CM

## 2014-03-15 DIAGNOSIS — I5023 Acute on chronic systolic (congestive) heart failure: Secondary | ICD-10-CM

## 2014-03-15 DIAGNOSIS — Z950 Presence of cardiac pacemaker: Secondary | ICD-10-CM

## 2014-03-15 DIAGNOSIS — I4891 Unspecified atrial fibrillation: Secondary | ICD-10-CM | POA: Diagnosis present

## 2014-03-15 DIAGNOSIS — E785 Hyperlipidemia, unspecified: Secondary | ICD-10-CM | POA: Diagnosis present

## 2014-03-15 DIAGNOSIS — R7881 Bacteremia: Secondary | ICD-10-CM

## 2014-03-15 DIAGNOSIS — J811 Chronic pulmonary edema: Secondary | ICD-10-CM | POA: Diagnosis present

## 2014-03-15 DIAGNOSIS — I330 Acute and subacute infective endocarditis: Secondary | ICD-10-CM

## 2014-03-15 DIAGNOSIS — R0602 Shortness of breath: Secondary | ICD-10-CM

## 2014-03-15 DIAGNOSIS — E875 Hyperkalemia: Secondary | ICD-10-CM | POA: Diagnosis present

## 2014-03-15 DIAGNOSIS — Z720 Tobacco use: Secondary | ICD-10-CM

## 2014-03-15 DIAGNOSIS — I120 Hypertensive chronic kidney disease with stage 5 chronic kidney disease or end stage renal disease: Secondary | ICD-10-CM | POA: Diagnosis present

## 2014-03-15 DIAGNOSIS — R6521 Severe sepsis with septic shock: Secondary | ICD-10-CM

## 2014-03-15 DIAGNOSIS — E039 Hypothyroidism, unspecified: Secondary | ICD-10-CM | POA: Diagnosis present

## 2014-03-15 DIAGNOSIS — J9620 Acute and chronic respiratory failure, unspecified whether with hypoxia or hypercapnia: Secondary | ICD-10-CM | POA: Diagnosis present

## 2014-03-15 DIAGNOSIS — A419 Sepsis, unspecified organism: Secondary | ICD-10-CM

## 2014-03-15 DIAGNOSIS — D649 Anemia, unspecified: Secondary | ICD-10-CM | POA: Diagnosis present

## 2014-03-15 DIAGNOSIS — R791 Abnormal coagulation profile: Secondary | ICD-10-CM | POA: Diagnosis present

## 2014-03-15 DIAGNOSIS — K5900 Constipation, unspecified: Secondary | ICD-10-CM | POA: Diagnosis present

## 2014-03-15 DIAGNOSIS — B952 Enterococcus as the cause of diseases classified elsewhere: Secondary | ICD-10-CM

## 2014-03-15 DIAGNOSIS — R531 Weakness: Secondary | ICD-10-CM

## 2014-03-15 DIAGNOSIS — E43 Unspecified severe protein-calorie malnutrition: Secondary | ICD-10-CM

## 2014-03-15 DIAGNOSIS — R0789 Other chest pain: Secondary | ICD-10-CM

## 2014-03-15 DIAGNOSIS — I739 Peripheral vascular disease, unspecified: Secondary | ICD-10-CM | POA: Diagnosis present

## 2014-03-15 DIAGNOSIS — I071 Rheumatic tricuspid insufficiency: Secondary | ICD-10-CM

## 2014-03-15 DIAGNOSIS — Z9119 Patient's noncompliance with other medical treatment and regimen: Secondary | ICD-10-CM

## 2014-03-15 DIAGNOSIS — F17210 Nicotine dependence, cigarettes, uncomplicated: Secondary | ICD-10-CM | POA: Diagnosis present

## 2014-03-15 DIAGNOSIS — I38 Endocarditis, valve unspecified: Secondary | ICD-10-CM | POA: Diagnosis present

## 2014-03-15 DIAGNOSIS — Z95810 Presence of automatic (implantable) cardiac defibrillator: Secondary | ICD-10-CM

## 2014-03-15 DIAGNOSIS — F329 Major depressive disorder, single episode, unspecified: Secondary | ICD-10-CM | POA: Diagnosis present

## 2014-03-15 LAB — CBC WITH DIFFERENTIAL/PLATELET
Basophils Absolute: 0 10*3/uL (ref 0.0–0.1)
Basophils Relative: 0 % (ref 0–1)
Eosinophils Absolute: 0.1 10*3/uL (ref 0.0–0.7)
Eosinophils Relative: 2 % (ref 0–5)
HEMATOCRIT: 40.9 % (ref 36.0–46.0)
HEMOGLOBIN: 12.7 g/dL (ref 12.0–15.0)
LYMPHS ABS: 0.5 10*3/uL — AB (ref 0.7–4.0)
Lymphocytes Relative: 8 % — ABNORMAL LOW (ref 12–46)
MCH: 34 pg (ref 26.0–34.0)
MCHC: 31.1 g/dL (ref 30.0–36.0)
MCV: 109.4 fL — ABNORMAL HIGH (ref 78.0–100.0)
MONOS PCT: 7 % (ref 3–12)
Monocytes Absolute: 0.5 10*3/uL (ref 0.1–1.0)
NEUTROS ABS: 5.3 10*3/uL (ref 1.7–7.7)
Neutrophils Relative %: 83 % — ABNORMAL HIGH (ref 43–77)
Platelets: 126 10*3/uL — ABNORMAL LOW (ref 150–400)
RBC: 3.74 MIL/uL — AB (ref 3.87–5.11)
RDW: 17.4 % — ABNORMAL HIGH (ref 11.5–15.5)
WBC: 6.5 10*3/uL (ref 4.0–10.5)

## 2014-03-15 LAB — COMPREHENSIVE METABOLIC PANEL
ALT: 21 U/L (ref 0–35)
ANION GAP: 19 — AB (ref 5–15)
AST: 33 U/L (ref 0–37)
Albumin: 3.3 g/dL — ABNORMAL LOW (ref 3.5–5.2)
Alkaline Phosphatase: 165 U/L — ABNORMAL HIGH (ref 39–117)
BUN: 57 mg/dL — ABNORMAL HIGH (ref 6–23)
CALCIUM: 8.4 mg/dL (ref 8.4–10.5)
CO2: 25 mEq/L (ref 19–32)
Chloride: 91 mEq/L — ABNORMAL LOW (ref 96–112)
Creatinine, Ser: 7.68 mg/dL — ABNORMAL HIGH (ref 0.50–1.10)
GFR calc Af Amer: 5 mL/min — ABNORMAL LOW (ref >90)
GFR calc non Af Amer: 4 mL/min — ABNORMAL LOW (ref >90)
Glucose, Bld: 100 mg/dL — ABNORMAL HIGH (ref 70–99)
Potassium: 7 mEq/L (ref 3.7–5.3)
SODIUM: 135 meq/L — AB (ref 137–147)
TOTAL PROTEIN: 7.2 g/dL (ref 6.0–8.3)
Total Bilirubin: 0.3 mg/dL (ref 0.3–1.2)

## 2014-03-15 LAB — I-STAT TROPONIN, ED: Troponin i, poc: 0.03 ng/mL (ref 0.00–0.08)

## 2014-03-15 MED ORDER — FOLIC ACID 0.5 MG HALF TAB
400.0000 ug | ORAL_TABLET | Freq: Every day | ORAL | Status: DC
  Administered 2014-03-16 – 2014-03-19 (×4): 0.5 mg via ORAL
  Filled 2014-03-15 (×4): qty 1

## 2014-03-15 MED ORDER — ACETAMINOPHEN 325 MG PO TABS: 650.0000 mg | ORAL_TABLET | Freq: Four times a day (QID) | ORAL | Status: DC | PRN

## 2014-03-15 MED ORDER — HEPARIN SODIUM (PORCINE) 5000 UNIT/ML IJ SOLN
5000.0000 [IU] | Freq: Three times a day (TID) | INTRAMUSCULAR | Status: DC
  Administered 2014-03-16 – 2014-03-19 (×11): 5000 [IU] via SUBCUTANEOUS
  Filled 2014-03-15 (×13): qty 1

## 2014-03-15 MED ORDER — OLOPATADINE HCL 0.1 % OP SOLN
1.0000 [drp] | Freq: Two times a day (BID) | OPHTHALMIC | Status: DC
  Administered 2014-03-16 – 2014-03-19 (×8): 1 [drp] via OPHTHALMIC
  Filled 2014-03-15: qty 5

## 2014-03-15 MED ORDER — LEVOTHYROXINE SODIUM 150 MCG PO TABS
150.0000 ug | ORAL_TABLET | Freq: Every day | ORAL | Status: DC
  Administered 2014-03-16 – 2014-03-19 (×4): 150 ug via ORAL
  Filled 2014-03-15 (×5): qty 1

## 2014-03-15 MED ORDER — DOCUSATE SODIUM 100 MG PO CAPS
100.0000 mg | ORAL_CAPSULE | Freq: Every day | ORAL | Status: DC
  Administered 2014-03-16 – 2014-03-19 (×4): 100 mg via ORAL
  Filled 2014-03-15 (×4): qty 1

## 2014-03-15 MED ORDER — FUROSEMIDE 80 MG PO TABS
80.0000 mg | ORAL_TABLET | Freq: Two times a day (BID) | ORAL | Status: DC
  Administered 2014-03-16 (×2): 80 mg via ORAL
  Filled 2014-03-15 (×5): qty 1

## 2014-03-15 MED ORDER — ASPIRIN EC 81 MG PO TBEC
81.0000 mg | DELAYED_RELEASE_TABLET | Freq: Every day | ORAL | Status: DC
  Administered 2014-03-16 – 2014-03-19 (×4): 81 mg via ORAL
  Filled 2014-03-15 (×4): qty 1

## 2014-03-15 MED ORDER — OXYCODONE-ACETAMINOPHEN 7.5-325 MG PO TABS: 1.0000 | ORAL_TABLET | Freq: Three times a day (TID) | ORAL | Status: DC | PRN

## 2014-03-15 MED ORDER — SODIUM CHLORIDE 0.9 % IJ SOLN
3.0000 mL | Freq: Two times a day (BID) | INTRAMUSCULAR | Status: DC
  Administered 2014-03-16 – 2014-03-19 (×7): 3 mL via INTRAVENOUS

## 2014-03-15 MED ORDER — MEGESTROL ACETATE 40 MG/ML PO SUSP
1800.0000 mg | Freq: Every day | ORAL | Status: DC
  Filled 2014-03-15 (×2): qty 45

## 2014-03-15 MED ORDER — DEXTROSE 50 % IV SOLN
1.0000 | Freq: Once | INTRAVENOUS | Status: AC
  Administered 2014-03-15: 50 mL via INTRAVENOUS
  Filled 2014-03-15: qty 50

## 2014-03-15 MED ORDER — CINACALCET HCL 30 MG PO TABS
60.0000 mg | ORAL_TABLET | Freq: Every day | ORAL | Status: DC
  Administered 2014-03-16 – 2014-03-19 (×4): 60 mg via ORAL
  Filled 2014-03-15 (×5): qty 2

## 2014-03-15 MED ORDER — PRAMOXINE-ZINC OXIDE IN MO 1-12.5 % RE OINT
TOPICAL_OINTMENT | Freq: Two times a day (BID) | RECTAL | Status: DC | PRN
  Filled 2014-03-15: qty 28.3

## 2014-03-15 MED ORDER — IPRATROPIUM BROMIDE 0.02 % IN SOLN
0.5000 mg | Freq: Two times a day (BID) | RESPIRATORY_TRACT | Status: DC | PRN
  Administered 2014-03-16 – 2014-03-17 (×2): 0.5 mg via RESPIRATORY_TRACT
  Filled 2014-03-15 (×3): qty 2.5

## 2014-03-15 MED ORDER — CALCIUM GLUCONATE 10 % IV SOLN
1.0000 g | INTRAVENOUS | Status: AC
Start: 2014-03-15 — End: 2014-03-15
  Administered 2014-03-15: 1 g via INTRAVENOUS
  Filled 2014-03-15: qty 10

## 2014-03-15 MED ORDER — METHYLPREDNISOLONE SODIUM SUCC 125 MG IJ SOLR
125.0000 mg | Freq: Once | INTRAMUSCULAR | Status: AC
  Administered 2014-03-15: 125 mg via INTRAVENOUS
  Filled 2014-03-15: qty 2

## 2014-03-15 MED ORDER — DOXYCYCLINE HYCLATE 100 MG PO TABS
100.0000 mg | ORAL_TABLET | Freq: Two times a day (BID) | ORAL | Status: DC
  Administered 2014-03-16 – 2014-03-19 (×7): 100 mg via ORAL
  Filled 2014-03-15 (×8): qty 1

## 2014-03-15 MED ORDER — HYDRALAZINE HCL 25 MG PO TABS
25.0000 mg | ORAL_TABLET | Freq: Three times a day (TID) | ORAL | Status: DC
  Administered 2014-03-16 – 2014-03-19 (×11): 25 mg via ORAL
  Filled 2014-03-15 (×13): qty 1

## 2014-03-15 MED ORDER — IPRATROPIUM BROMIDE 0.02 % IN SOLN
RESPIRATORY_TRACT | Status: AC
  Administered 2014-03-15: 0.5 mg
  Filled 2014-03-15: qty 2.5

## 2014-03-15 MED ORDER — METHYLPREDNISOLONE SODIUM SUCC 125 MG IJ SOLR
60.0000 mg | Freq: Two times a day (BID) | INTRAMUSCULAR | Status: DC
  Administered 2014-03-16 – 2014-03-18 (×5): 60 mg via INTRAVENOUS
  Filled 2014-03-15: qty 2
  Filled 2014-03-15 (×4): qty 0.96
  Filled 2014-03-15: qty 2
  Filled 2014-03-15 (×3): qty 0.96

## 2014-03-15 MED ORDER — INSULIN ASPART 100 UNIT/ML ~~LOC~~ SOLN
5.0000 [IU] | Freq: Once | SUBCUTANEOUS | Status: AC
  Administered 2014-03-15: 5 [IU] via SUBCUTANEOUS
  Filled 2014-03-15: qty 1

## 2014-03-15 MED ORDER — NITROGLYCERIN 0.4 MG SL SUBL: 0.4000 mg | SUBLINGUAL_TABLET | SUBLINGUAL | Status: DC | PRN

## 2014-03-15 MED ORDER — ALPRAZOLAM 0.5 MG PO TABS
0.5000 mg | ORAL_TABLET | Freq: Three times a day (TID) | ORAL | Status: DC | PRN
  Administered 2014-03-16 – 2014-03-19 (×6): 0.5 mg via ORAL
  Filled 2014-03-15 (×6): qty 1

## 2014-03-15 MED ORDER — CALCIUM POLYCARBOPHIL 625 MG PO TABS
625.0000 mg | ORAL_TABLET | Freq: Every day | ORAL | Status: DC
  Administered 2014-03-16 – 2014-03-19 (×4): 625 mg via ORAL
  Filled 2014-03-15 (×4): qty 1

## 2014-03-15 MED ORDER — ATORVASTATIN CALCIUM 80 MG PO TABS
80.0000 mg | ORAL_TABLET | Freq: Every day | ORAL | Status: DC
  Administered 2014-03-16 – 2014-03-19 (×4): 80 mg via ORAL
  Filled 2014-03-15 (×4): qty 1

## 2014-03-15 MED ORDER — ALBUTEROL SULFATE (2.5 MG/3ML) 0.083% IN NEBU
INHALATION_SOLUTION | RESPIRATORY_TRACT | Status: AC
  Administered 2014-03-15: 5 mg
  Filled 2014-03-15: qty 6

## 2014-03-15 NOTE — ED Notes (Signed)
Critical Potassium of 7.0, EDP notified.

## 2014-03-15 NOTE — ED Notes (Signed)
Pt is a dialysis patient and has been having a cough for the past two days. Non-productive cough, the last time she had dialysis was Friday because she hasn't felt well enough to go. Has been wheezing and was given a breathing treatment by her home aid. Also reports abd and back pain

## 2014-03-15 NOTE — Procedures (Signed)
I have personally attended this patient's dialysis session.   Agrees to 3 hours only 1K bath for 1 hour, 2K remainder Unable to weigh - pull 3-4 liters No heparin  [name], MD [facility] [phone] Pager 03/15/2014, 8:33 PM

## 2014-03-15 NOTE — H&P (Signed)
[facility] History and Physical  [name] BJY:[id number] DOB: Sep 04, 1932 DOA: 03/15/2014  Referring physician: EDP PCP: Galvin ProfferHAGUE, IMRAN P, MD   Chief Complaint: SOB   HPI: Maria LefevreVera M Mederos is a 80 y.o. female with ESRD, COPD, ICM EF 15-20%, missed her dialysis session yesterday due to being SOB with wheezing.  Breathing treatment at home provided no relief.  Unfortunatly today her SOB became worse with wet sounding breath sounds and cough.  As a result she is brought in to ED by family.  Review of Systems: Systems reviewed.  As above, otherwise negative  Past Medical History  Diagnosis Date  . Carotid artery occlusion     Bilateral carotid artery disease  . Peripheral artery disease   . CHF (congestive heart failure)     EF 15-20% on TEE 12/15/13  . Hypertension   . Hyperlipidemia   . ESRD on hemodialysis 06/20/11    M, W, F; [address], [place].  Started HD in Sept 2008  . AAA (abdominal aortic aneurysm)   . Stroke 2007  . Duodenal ulcer 2008  . S/P CABG (coronary artery bypass graft)     CABG in 2008, had 3 MI's prior  . Depression   . Pneumonia   . COPD, severe   . Hypothyroidism   . Anemia   . Pacemaker   . Atrial fibrillation   . Complete heart block    Past Surgical History  Procedure Laterality Date  . Thyroid surgery  2004  . Dg av dialysis  shunt access exist*l* or  03/2000    left upper arm  . Vaginal hysterectomy  1976  . Hemorrhoid surgery  1971  . Insert / replace / remove pacemaker  1990's    initial placement  . Insert / replace / remove pacemaker  05/2011    MDT ADDRL1 pacemaker implanted by Dr Dulce SellarMunley  . Incisional hernia repair  10/2010    incarcerated  . Common iliac  06/2009    bilateral kissing stents  . Pseudoaneurysm repair  09/1999    right groin  . Av fistula repair  04/2007    left upper arm  . Cardiac catheterization  02/10, 03/12    Most recent showed 3 vessel CAD with patent grafts: SVG to LAD, SVG to PDA and SVG to OM  .  Coronary angioplasty with stent placement  09/1999    "1"  . Coronary artery bypass graft  02/2007    CABG X3  . Tee without cardioversion N/A 12/02/2013    Procedure: TRANSESOPHAGEAL ECHOCARDIOGRAM (TEE);  Surgeon: Lewayne BuntingBrian S Crenshaw, MD;  [facility];  Service: Cardiovascular;  Laterality: N/A;   Social History:  reports that she has been smoking Cigarettes.  She has a 20 pack-year smoking history. She has never used smokeless tobacco. She reports that she does not drink alcohol or use illicit drugs.  Allergies  Allergen Reactions  . Ivp Dye [Iodinated Diagnostic Agents] Swelling  . Lisinopril Swelling  . Iohexol Itching and Swelling         No family history on file.   Prior to Admission medications   Medication Sig Start Date End Date Taking? Authorizing Provider  acetaminophen (TYLENOL) 325 MG tablet Take 2 tablets (650 mg total) by mouth every 6 (six) hours as needed for mild pain, fever or headache. 12/03/13  Yes Calvert CantorSaima Rizwan, MD  ALPRAZolam Prudy Feeler(XANAX) 0.5 MG tablet Take 0.5 mg by mouth 3 (three) times daily as needed for anxiety.   Yes  Historical Provider, MD  aspirin EC 81 MG tablet Take 81 mg by mouth daily.   Yes Historical Provider, MD  atorvastatin (LIPITOR) 80 MG tablet Take 80 mg by mouth daily.   Yes Historical Provider, MD  cinacalcet (SENSIPAR) 60 MG tablet Take 60 mg by mouth daily.   Yes Historical Provider, MD  dextromethorphan (DELSYM) 30 MG/5ML liquid Take 30 mg by mouth every 12 (twelve) hours as needed for cough.    Yes Historical Provider, MD  docusate sodium (COLACE) 100 MG capsule Take 100 mg by mouth daily.    Yes Historical Provider, MD  Fiber, Guar Gum, CHEW Chew 1 each by mouth daily.   Yes Historical Provider, MD  folic acid (FOLVITE) 400 MCG tablet Take 400 mcg by mouth daily.   Yes Historical Provider, MD  furosemide (LASIX) 80 MG tablet Take 80 mg by mouth 2 (two) times daily.    Yes Historical Provider, MD  hydrALAZINE (APRESOLINE) 25 MG tablet  Take 25 mg by mouth 3 (three) times daily.   Yes Historical Provider, MD  ipratropium (ATROVENT) 0.02 % nebulizer solution Take 0.5 mg by nebulization 2 (two) times daily as needed for wheezing or shortness of breath.   Yes Historical Provider, MD  levothyroxine (SYNTHROID, LEVOTHROID) 150 MCG tablet Take 150 mcg by mouth daily before breakfast.   Yes Historical Provider, MD  megestrol (MEGACE) 40 MG/ML suspension Take 45 mLs by mouth daily. 03/11/14  Yes Historical Provider, MD  nitroGLYCERIN (NITROSTAT) 0.4 MG SL tablet Place 0.4 mg under the tongue every 5 (five) minutes as needed for chest pain.    Yes Historical Provider, MD  olopatadine (PATANOL) 0.1 % ophthalmic solution Place 1 drop into both eyes 2 (two) times daily. Affected eye   Yes Historical Provider, MD  oxyCODONE-acetaminophen (PERCOCET) 7.5-325 MG per tablet Take 1 tablet by mouth every 8 (eight) hours as needed for pain.    Yes Historical Provider, MD  pramoxine-mineral oil-zinc (TUCKS) 1-12.5 % rectal ointment Place rectally 2 (two) times daily as needed for itching or hemorrhoids. 11/16/13  Yes Hollice EspySendil K Krishnan, MD   Physical Exam: Filed Vitals:   03/15/14 1945  BP: 132/53  Pulse: 77  Temp:   Resp: 14    BP 132/53  Pulse 77  Temp(Src) 98 F (36.7 C) (Oral)  Resp 14  Ht 5\' 3"  (1.6 m)  Wt 45.36 kg (100 lb)  BMI 17.72 kg/m2  SpO2 96%  General Appearance:    Somnolent, oriented, no distress, appears stated age  Head:    Normocephalic, atraumatic  Eyes:    PERRL, EOMI, sclera non-icteric        Nose:   Nares without drainage or epistaxis. Mucosa, turbinates normal  Throat:   Moist mucous membranes. Oropharynx without erythema or exudate.  Neck:   Supple. No carotid bruits.  No thyromegaly.  No lymphadenopathy.   Back:     No CVA tenderness, no spinal tenderness  Lungs:     Diffuse rhonchi and wheezes.  Chest wall:    No tenderness to palpitation  Heart:    Regular rate and rhythm without murmurs, gallops, rubs   Abdomen:     Soft, non-tender, nondistended, normal bowel sounds, no organomegaly  Genitalia:    deferred  Rectal:    deferred  Extremities:   No clubbing, cyanosis or edema.  Pulses:   2+ and symmetric all extremities  Skin:   Skin color, texture, turgor normal, no rashes or lesions  Lymph nodes:  Cervical, supraclavicular, and axillary nodes normal  Neurologic:   CNII-XII intact. Normal strength, sensation and reflexes      throughout    Labs on Admission:  Basic Metabolic Panel:  Recent Labs Lab 03/15/14 1655  NA 135*  K 7.0*  CL 91*  CO2 25  GLUCOSE 100*  BUN 57*  CREATININE 7.68*  CALCIUM 8.4   Liver Function Tests:  Recent Labs Lab 03/15/14 1655  AST 33  ALT 21  ALKPHOS 165*  BILITOT 0.3  PROT 7.2  ALBUMIN 3.3*   No results found for this basename: LIPASE, AMYLASE,  in the last 168 hours No results found for this basename: AMMONIA,  in the last 168 hours CBC:  Recent Labs Lab 03/15/14 1655  WBC 6.5  NEUTROABS 5.3  HGB 12.7  HCT 40.9  MCV 109.4*  PLT 126*   Cardiac Enzymes: No results found for this basename: CKTOTAL, CKMB, CKMBINDEX, TROPONINI,  in the last 168 hours  BNP (last 3 results) No results found for this basename: PROBNP,  in the last 8760 hours CBG: No results found for this basename: GLUCAP,  in the last 168 hours  Radiological Exams on Admission: Dg Chest 2 View  03/15/2014   CLINICAL DATA:  COUGH ABDOMINAL PAIN cough.  Shortness of breath.  EXAM: CHEST  2 VIEW  COMPARISON:  CT abdomen 01/31/2014. Multiple chest radiographs dating back to 06/07/2013.  FINDINGS: Cardiomegaly. Median sternotomy/ CABG. Pulmonary vascular congestion is present. Diffuse interstitial prominence likely rib relates to chronic congestive heart failure. Bilateral pleural apical thickening. Thoracic compression fractures present. Coronary artery stents. Two lead RIGHT subclavian cardiac pacemaker leads are unchanged. Calcified av fistula is present in the LEFT  arm.  IMPRESSION: Chronic cardiomegaly and pulmonary vascular congestion. No interval change or acute abnormality.   Electronically Signed   By: Andreas Newport M.D.   On: 03/15/2014 18:47    EKG: Independently reviewed.  Assessment/Plan Principal Problem:   Noncompliance with renal dialysis Active Problems:   ESRD (end stage renal disease) on dialysis   Pulmonary edema   Patient nonadherence   Hyperkalemia   Acute on chronic systolic CHF (congestive heart failure)   Chronic systolic CHF (congestive heart failure)   COPD exacerbation   1. COPD exacerbation 1. Adult wheeze protocol 2. Solumedrol 3. Doxycycline 4. Continuous pulse ox 2. Non-complaince with renal dialysis leading to exacerbation of CHF (baseline EF of only 15-20%) and hyperkalemia of 7.0 - 1. Dialysis tonight 2. Tele monitor 3. Repeat labs in AM   Code Status: Full Code  Family Communication: Family at bedside Disposition Plan: Admit to inpatient   Time spent: 70 min  Raif Chachere M. [facility] Pager [phone]  If 7AM-7PM, please contact the day team taking care of the patient [email or web address] Password [email or web address] 03/15/2014, 8:05 PM

## 2014-03-15 NOTE — Progress Notes (Signed)
Attempted to call Tobi Bastosnna the ED nurse to get report on pt,ED secretary could not locate Tobi Bastosnna so [facility] who located Tobi Bastosnna in the ED.Tobi Bastosnna did not give me report because the pt went to hemodialysis.Artemio Alyheryl Shekera Beavers RN

## 2014-03-15 NOTE — ED Notes (Signed)
Pt transported to xray; family at bedside

## 2014-03-15 NOTE — Progress Notes (Signed)
Attepted to call report on pt no one can locate the ED nurse Tobi Bastosnna will call back.Shanon Payorheryl Marrshall RN

## 2014-03-15 NOTE — ED Notes (Signed)
Warm blanket given

## 2014-03-15 NOTE — ED Provider Notes (Signed)
CSN: [id number]     Arrival date & time 03/15/14  1614 History   First MD Initiated Contact with Patient 03/15/14 1730     Chief Complaint  Patient presents with  . Cough  . Abdominal Pain   Patient is a [age] y.o. female presenting with cough. The history is provided by the patient and a relative.  Cough Cough characteristics: Wet. Severity:  Moderate Onset quality:  Gradual Duration:  3 days Timing:  Constant Progression:  Worsening Chronicity:  Recurrent Smoker: yes   Context: smoke exposure   Relieved by:  Nothing Ineffective treatments:  Beta-agonist inhaler (Oxygen) Associated symptoms: shortness of breath   Associated symptoms: no chest pain, no fever, no headaches, no rash, no rhinorrhea and no sore throat     80 year old female with history of hypertension, hyperlipidemia, end-stage renal disease on Monday Wednesday Friday dialysis who presents with cough and abdominal pain. Patient did not go to dialysis yesterday because she is feeling ill. Reports a wet cough with audible wheezing. She denies fevers chills or night sweats. Patient states she was seen by her PCP yesterday and received a steroid shot and an antibiotic shot. Symptoms have not improved with home breathing treatment. Patient continues to smoke 2 cigarettes a day. Past Medical History  Diagnosis Date  . Carotid artery occlusion     Bilateral carotid artery disease  . Peripheral artery disease   . CHF (congestive heart failure)     EF 15-20% on TEE 12/15/13  . Hypertension   . Hyperlipidemia   . ESRD on hemodialysis 06/20/11    M[address] W[address] F[address], [place].  Started HD in Sept 2008  . AAA (abdominal aortic aneurysm)   . Stroke 2007  . Duodenal ulcer 2008  . S/P CABG (coronary artery bypass graft)     CABG in 2008, had 3 MI's prior  . Depression   . Pneumonia   . COPD, severe   . Hypothyroidism   . Anemia   . Pacemaker   . Atrial fibrillation   . Complete heart block    Past Surgical History   Procedure Laterality Date  . Thyroid surgery  2004  . Dg av dialysis  shunt access exist*l* or  03/2000    left upper arm  . Vaginal hysterectomy  1976  . Hemorrhoid surgery  1971  . Insert / replace / remove pacemaker  1990's    initial placement  . Insert / replace / remove pacemaker  05/2011    MDT ADDRL1 pacemaker implanted by Dr Dulce SellarMunley  . Incisional hernia repair  10/2010    incarcerated  . Common iliac  06/2009    bilateral kissing stents  . Pseudoaneurysm repair  09/1999    right groin  . Av fistula repair  04/2007    left upper arm  . Cardiac catheterization  02/10, 03/12    Most recent showed 3 vessel CAD with patent grafts: SVG to LAD, SVG to PDA and SVG to OM  . Coronary angioplasty with stent placement  09/1999    "1"  . Coronary artery bypass graft  02/2007    CABG X3  . Tee without cardioversion N/A 12/02/2013    Procedure: TRANSESOPHAGEAL ECHOCARDIOGRAM (TEE);  Surgeon: Lewayne BuntingBrian S Crenshaw, MD;  [facility];  Service: Cardiovascular;  Laterality: N/A;   No family history on file. History  Substance Use Topics  . Smoking status: Current Every Day Smoker -- 0.50 packs/day for 40 years    Types: Cigarettes  .  Smokeless tobacco: Never Used  . Alcohol Use: No   OB History   Grav Para Term Preterm Abortions TAB SAB Ect Mult Living                 Review of Systems  Constitutional: Negative for fever.  HENT: Negative for rhinorrhea and sore throat.   Eyes: Negative for visual disturbance.  Respiratory: Positive for cough and shortness of breath. Negative for chest tightness.   Cardiovascular: Negative for chest pain and palpitations.  Gastrointestinal: Positive for abdominal pain. Negative for nausea, vomiting and constipation.  Genitourinary: Negative for dysuria and hematuria.  Musculoskeletal: Negative for back pain and neck pain.  Skin: Negative for rash.  Neurological: Negative for dizziness and headaches.  Psychiatric/Behavioral: Negative for  confusion.  All other systems reviewed and are negative.  Allergies  Ivp dye; Lisinopril; and Iohexol  Home Medications   Prior to Admission medications   Medication Sig Start Date End Date Taking? Authorizing Provider  acetaminophen (TYLENOL) 325 MG tablet Take 2 tablets (650 mg total) by mouth every 6 (six) hours as needed for mild pain, fever or headache. 12/03/13  Yes Calvert Cantor, MD  ALPRAZolam Prudy Feeler) 0.5 MG tablet Take 0.5 mg by mouth 3 (three) times daily as needed for anxiety.   Yes Historical Provider, MD  aspirin EC 81 MG tablet Take 81 mg by mouth daily.   Yes Historical Provider, MD  atorvastatin (LIPITOR) 80 MG tablet Take 80 mg by mouth daily.   Yes Historical Provider, MD  cinacalcet (SENSIPAR) 60 MG tablet Take 60 mg by mouth daily.   Yes Historical Provider, MD  dextromethorphan (DELSYM) 30 MG/5ML liquid Take 30 mg by mouth every 12 (twelve) hours as needed for cough.    Yes Historical Provider, MD  docusate sodium (COLACE) 100 MG capsule Take 100 mg by mouth daily.    Yes Historical Provider, MD  Fiber, Guar Gum, CHEW Chew 1 each by mouth daily.   Yes Historical Provider, MD  folic acid (FOLVITE) 400 MCG tablet Take 400 mcg by mouth daily.   Yes Historical Provider, MD  furosemide (LASIX) 80 MG tablet Take 80 mg by mouth 2 (two) times daily.    Yes Historical Provider, MD  hydrALAZINE (APRESOLINE) 25 MG tablet Take 25 mg by mouth 3 (three) times daily.   Yes Historical Provider, MD  ipratropium (ATROVENT) 0.02 % nebulizer solution Take 0.5 mg by nebulization 2 (two) times daily as needed for wheezing or shortness of breath.   Yes Historical Provider, MD  levothyroxine (SYNTHROID, LEVOTHROID) 150 MCG tablet Take 150 mcg by mouth daily before breakfast.   Yes Historical Provider, MD  megestrol (MEGACE) 40 MG/ML suspension Take 45 mLs by mouth daily. 03/11/14  Yes Historical Provider, MD  nitroGLYCERIN (NITROSTAT) 0.4 MG SL tablet Place 0.4 mg under the tongue every 5 (five)  minutes as needed for chest pain.    Yes Historical Provider, MD  olopatadine (PATANOL) 0.1 % ophthalmic solution Place 1 drop into both eyes 2 (two) times daily. Affected eye   Yes Historical Provider, MD  oxyCODONE-acetaminophen (PERCOCET) 7.5-325 MG per tablet Take 1 tablet by mouth every 8 (eight) hours as needed for pain.    Yes Historical Provider, MD  pramoxine-mineral oil-zinc (TUCKS) 1-12.5 % rectal ointment Place rectally 2 (two) times daily as needed for itching or hemorrhoids. 11/16/13  Yes Hollice Espy, MD   BP 120/62  Pulse 77  Temp(Src) 98 F (36.7 C) (Oral)  Resp 20  Ht 5\' 3"  (1.6 m)  Wt 100 lb (45.36 kg)  BMI 17.72 kg/m2  SpO2 91% Physical Exam  Constitutional: She is oriented to person, place, and time. She appears well-developed and well-nourished. No distress.  HENT:  Head: Normocephalic and atraumatic.  Mouth/Throat: Oropharynx is clear and moist.  Eyes: EOM are normal. Pupils are equal, round, and reactive to light.  Neck: Neck supple. No JVD present.  Cardiovascular: Normal rate, regular rhythm, normal heart sounds and intact distal pulses.  Exam reveals no gallop.   No murmur heard. Pulmonary/Chest: Effort normal and breath sounds normal. She has no wheezes. She has no rales.  Abdominal: Soft. She exhibits no distension. There is no tenderness.  Musculoskeletal: Normal range of motion. She exhibits no tenderness.  Neurological: She is alert and oriented to person, place, and time. No cranial nerve deficit. She exhibits normal muscle tone.  Skin: Skin is warm and dry. No rash noted.  Psychiatric: Her behavior is normal.    ED Course  Procedures none  Labs Review Labs Reviewed  CBC WITH DIFFERENTIAL - Abnormal; Notable for the following:    RBC 3.74 (*)    MCV 109.4 (*)    RDW 17.4 (*)    Platelets 126 (*)    Neutrophils Relative % 83 (*)    Lymphocytes Relative 8 (*)    Lymphs Abs 0.5 (*)    All other components within normal limits   COMPREHENSIVE METABOLIC PANEL - Abnormal; Notable for the following:    Sodium 135 (*)    Potassium 7.0 (*)    Chloride 91 (*)    Glucose, Bld 100 (*)    BUN 57 (*)    Creatinine, Ser 7.68 (*)    Albumin 3.3 (*)    Alkaline Phosphatase 165 (*)    GFR calc non Af Amer 4 (*)    GFR calc Af Amer 5 (*)    Anion gap 19 (*)    All other components within normal limits  RENAL FUNCTION PANEL  CBC  HEPATITIS B SURFACE ANTIGEN  I-STAT TROPOININ, ED    Imaging Review Dg Chest 2 View  03/15/2014   CLINICAL DATA:  COUGH ABDOMINAL PAIN cough.  Shortness of breath.  EXAM: CHEST  2 VIEW  COMPARISON:  CT abdomen 01/31/2014. Multiple chest radiographs dating back to 06/07/2013.  FINDINGS: Cardiomegaly. Median sternotomy/ CABG. Pulmonary vascular congestion is present. Diffuse interstitial prominence likely rib relates to chronic congestive heart failure. Bilateral pleural apical thickening. Thoracic compression fractures present. Coronary artery stents. Two lead RIGHT subclavian cardiac pacemaker leads are unchanged. Calcified av fistula is present in the LEFT arm.  IMPRESSION: Chronic cardiomegaly and pulmonary vascular congestion. No interval change or acute abnormality.   Electronically Signed   By: Andreas Newport M.D.   On: 03/15/2014 18:47    MDM   Final diagnoses:  Hyperkalemia  Acute on chronic systolic CHF (congestive heart failure)  COPD exacerbation  ESRD (end stage renal disease) on dialysis  Noncompliance with renal dialysis  Patient nonadherence  Acute pulmonary edema    80 year old female with ESRD, CHF, COPD presents with cough and wheezing. She has diffuse wheezing on exam. She is obtaining adequate sats on her home 2 L of oxygen. Chest x-ray shows no focal consolidation suggestive of pneumonia. EKG, wide QRS complex, no new significant changes from prior EKG. Stable rhythm. Her potassium is 7 and will need urgent dialysis. Patient given insulin with D50, and calcium gluconate.  Nephrology consult did and will  take to dialysis tonight. Patient will be admitted to the hospitalist service for COPD exacerbation. Patient given steroids and duo nebs in the ED. Patient is stable for transfer to the floor.  Case discussed with Dr. Fayrene FearingJames.   Maris BergerJonah Philena Obey, MD 03/16/14 [id number]  Maris BergerJonah Kaysea Raya, MD 03/16/14 [id number]

## 2014-03-15 NOTE — ED Notes (Signed)
Pt remains in xray.

## 2014-03-15 NOTE — Progress Notes (Signed)
Patient received 5mg  of albuterol and .5mg  of atrovent. Patient wheezing throughout all bases. Family at bedside. RT will continue to monitor.

## 2014-03-15 NOTE — Consult Note (Signed)
Requesting Physician:  EDP Reason for Consult:  Hyperkalemia in dialysis pt who missed treatment. Provision of dialysis and ESRD related services. HPI: The patient is a 80 y.o. year-old WF with ESRD ([facility] MWF), ischemic CM, prior CABG, COPD, h/o enterococcal bacteremia with septic shock/right sided endocarditis 12/2013.  She presented to the ED tonight with cough symptoms, wheezing. Onset of dry non-productive cough on Sunday, progressive SOB, wheezing. Went to her primary care in [place] yesterday - was give "an antibiotic shot and a shot of steroids".  Did not feel better, primary advised hospital evaluation.  Family brought her to the ED - CXR showed vascular congestion/diffuse interstitial prominence. Labs returned with K of 7  and we are asked to provide acute dialysis.  She has been given nebs and steroids in the ED for her wheezing.    Did not go to her usual HD yesterday (last treatment was Friday, dialyzed only 2'43' of a 3.5 hour treatment, but seldom ever stays on any longer that 2 1/2 hours.  Achieved her EDW of 44.5 kg. Says has persistent LE edema even when gets to EDW. States has been having issues with post HD bleeding from her access (does not appear that she gets any heparin)  AF recently has dropped from around 1500 to around 1000.    Past Medical History  Diagnosis Date  . Carotid artery occlusion     Bilateral carotid artery disease  . Peripheral artery disease   . CHF (congestive heart failure)     EF 25-30%.   . Hypertension   . Hyperlipidemia   . ESRD on hemodialysis 06/20/11    M, W, F; [address], [place].  Started HD in Sept 2008  . AAA (abdominal aortic aneurysm)   . Stroke ` 2007  . Duodenal ulcer 2008  . S/P CABG (coronary artery bypass graft)     CABG in 2008, had 3 MI's prior  . Depression   . Pneumonia   . COPD, severe   . Hypothyroidism   . Anemia   . Pacemaker   . Atrial fibrillation   . Complete heart block     Past Surgical History  Procedure  Laterality Date  . Thyroid surgery  2004  . Dg av dialysis  shunt access exist*l* or  03/2000    left upper arm  . Vaginal hysterectomy  1976  . Hemorrhoid surgery  1971  . Insert / replace / remove pacemaker  1990's    initial placement  . Insert / replace / remove pacemaker  05/2011    MDT ADDRL1 pacemaker implanted by Dr Dulce Sellar  . Incisional hernia repair  10/2010    incarcerated  . Common iliac  06/2009    bilateral kissing stents  . Pseudoaneurysm repair  09/1999    right groin  . Av fistula repair  04/2007    left upper arm  . Cardiac catheterization  02/10, 03/12    Most recent showed 3 vessel CAD with patent grafts: SVG to LAD, SVG to PDA and SVG to OM  . Coronary angioplasty with stent placement  09/1999    "1"  . Coronary artery bypass graft  02/2007    CABG X3  . Tee without cardioversion N/A 12/02/2013    Procedure: TRANSESOPHAGEAL ECHOCARDIOGRAM (TEE);  Surgeon: Lewayne Bunting, MD;  Location: [facility] ENDOSCOPY;  Service: Cardiovascular;  Laterality: N/A;    Family History: No family history on file. Social History:  reports that she has been smoking Cigarettes.  She has a 20 pack-year smoking history. She has never used smokeless tobacco. She reports that she does not drink alcohol or use illicit drugs.  Allergies  Allergen Reactions  . Ivp Dye [Iodinated Diagnostic Agents] Swelling  . Lisinopril Swelling  . Iohexol Itching and Swelling         Home medications: Prior to Admission medications   Medication Sig Start Date End Date Taking? Authorizing Provider  acetaminophen (TYLENOL) 325 MG tablet Take 2 tablets (650 mg total) by mouth every 6 (six) hours as needed for mild pain, fever or headache. 12/03/13   Calvert CantorSaima Rizwan, MD  ALPRAZolam Prudy Feeler(XANAX) 0.5 MG tablet Take 0.5 mg by mouth 3 (three) times daily as needed for anxiety.    Historical Provider, MD  aspirin EC 81 MG tablet Take 81 mg by mouth daily.    Historical Provider, MD  atorvastatin (LIPITOR) 80 MG tablet Take 80  mg by mouth daily.    Historical Provider, MD  cinacalcet (SENSIPAR) 60 MG tablet Take 60 mg by mouth daily.    Historical Provider, MD  dextromethorphan (DELSYM) 30 MG/5ML liquid Take 30 mg by mouth every 12 (twelve) hours as needed for cough.     Historical Provider, MD  docusate sodium (COLACE) 100 MG capsule Take 100 mg by mouth daily.     Historical Provider, MD  Fiber, Guar Gum, CHEW Chew 1 each by mouth daily.    Historical Provider, MD  folic acid (FOLVITE) 400 MCG tablet Take 400 mcg by mouth daily.    Historical Provider, MD  furosemide (LASIX) 80 MG tablet Take 80 mg by mouth 2 (two) times daily.     Historical Provider, MD  hydrALAZINE (APRESOLINE) 25 MG tablet Take 25 mg by mouth 3 (three) times daily.    Historical Provider, MD  ipratropium (ATROVENT) 0.02 % nebulizer solution Take 0.5 mg by nebulization 2 (two) times daily as needed for wheezing or shortness of breath.    Historical Provider, MD  levothyroxine (SYNTHROID, LEVOTHROID) 150 MCG tablet Take 150 mcg by mouth daily before breakfast.    Historical Provider, MD  nitroGLYCERIN (NITROSTAT) 0.4 MG SL tablet Place 0.4 mg under the tongue every 5 (five) minutes as needed for chest pain.     Historical Provider, MD  olopatadine (PATANOL) 0.1 % ophthalmic solution Place 1 drop into both eyes 2 (two) times daily. Affected eye    Historical Provider, MD  oxyCODONE-acetaminophen (PERCOCET) 7.5-325 MG per tablet Take 1 tablet by mouth every 8 (eight) hours as needed for pain.     Historical Provider, MD  pramoxine-mineral oil-zinc (TUCKS) 1-12.5 % rectal ointment Place rectally 2 (two) times daily as needed for itching or hemorrhoids. 11/16/13   Hollice EspySendil K Krishnan, MD  promethazine (PHENERGAN) 25 MG tablet Take 25 mg by mouth every 6 (six) hours as needed for nausea or vomiting.     Historical Provider, MD  temazepam (RESTORIL) 30 MG capsule Take 30 mg by mouth at bedtime as needed for sleep.     Historical Provider, MD  tiotropium  (SPIRIVA) 18 MCG inhalation capsule Place 1 capsule (18 mcg total) into inhaler and inhale daily. 06/26/11   Elease EtienneAnand D Hongalgi, MD    Inpatient medications: . methylPREDNISolone (SOLU-MEDROL) injection  125 mg Intravenous Once    Review of Systems Gen:  "weak as a kitten"  Weaker on dialysis days No fever or chills HEENT:  No sore throat Resp: progressive dyspnea with wheezing past 48-72 hours with cough, non-productive  Cardiac:  No chest pain GI: + epigastric abd discomfort. GU:  neg Derm:  No rashes Neuro:   + increased anxiety past week or more + chronic persistent edema never resolves with HD   Physical Exam:  Blood pressure 121/68, pulse 76, temperature 98 F (36.7 C), temperature source Oral, resp. rate 16, height 5\' 3"  (1.6 m), weight 45.36 kg (100 lb), SpO2 99.00%. Gen: Sallow complected older WF  Audibly wheezing Skin: no rash, cyanosis Neck: +JVD Chest: Diffuse symphonic exp wheezing. Difficult to hear anything above/beyond the wheezing Heart: Heart sound obscured by resp noise Abdomen: +BS. Not focally tender.  Liver edge below RCM. Ext: 2+ edema LE's  Large aneuysmal AVF left arm Neuro: alert, Ox3, no focal deficit Heme/Lymph: no bruising or LAN  Recent Labs Lab 03/15/14 1655  NA 135*  K 7.0*  CL 91*  CO2 25  GLUCOSE 100*  BUN 57*  CREATININE 7.68*  CALCIUM 8.4    Recent Labs Lab 03/15/14 1655  AST 33  ALT 21  ALKPHOS 165*  BILITOT 0.3  PROT 7.2  ALBUMIN 3.3*   Recent Labs Lab 03/15/14 1655  WBC 6.5  NEUTROABS 5.3  HGB 12.7  HCT 40.9  MCV 109.4*  PLT 126*    Xrays/Other Studies: Dg Chest 2 View  03/15/2014   CLINICAL DATA:  COUGH ABDOMINAL PAIN cough.  Shortness of breath.  EXAM: CHEST  2 VIEW  COMPARISON:  CT abdomen 01/31/2014. Multiple chest radiographs dating back to 06/07/2013.  FINDINGS: Cardiomegaly. Median sternotomy/ CABG. Pulmonary vascular congestion is present. Diffuse interstitial prominence likely rib relates to chronic  congestive heart failure. Bilateral pleural apical thickening. Thoracic compression fractures present. Coronary artery stents. Two lead RIGHT subclavian cardiac pacemaker leads are unchanged. Calcified av fistula is present in the LEFT arm.  IMPRESSION: Chronic cardiomegaly and pulmonary vascular congestion. No interval change or acute abnormality.   Electronically Signed   By: Andreas NewportGeoffrey  Lamke M.D.   On: 03/15/2014 18:47   Dialysis Prescription [place] MWF 3.5 hours (never dialyzes more than 2.5 hours) 180 dialyzer BFR 350 2K2.25 Ca UF profile 2 AVF left Venofer 50 QWed, no Aranesp or vitamin D  Impression/Plan 1. Cough/SOB - CHF/COPD. Pt s/p steroids and "antibiotic" at primary care yesterday. Vol removal with HD. Steroids. Nebs.  Per primary team  2. ESRD - MWF. Missed HD yesterday.  Hyperkalemia K 7. Wide QRS. HD tonight 1K bath for an hour, then 2K. Recheck in AM. Needs lower EDW in my opinion. Agrees to HD for 3 hours tonight and no longer... 3. Post HD prolonged bleeding - per pt. Get details from center tomorrow. AF appears to have dropped from around 1500 to around 1000. Last f'gram 11/2013 with PTA tandem stenoses. When cardiopulm more stable, will need fistulagram. 4. Hyperkalemia - urgent dialysis as above 5. CAD/isch cardiomyopathy/prior CABG/TR related to presumed right sided endocarditis (12/2013) - consider ECHO  6. Anemia  - no ESA as outpt. Hb 12.4 last check 7. Secondary hyperpara - Last PTH 311. Not on Vit D.  Sensipar 60.Chronically elevated phosphorus 8. H/o enterococcal bacteremia/sepsis 12/2013. No recent fever.    Camille Balynthia Margart Zemanek,  MD [facility] [phone] pager 03/15/2014, 7:23 PM

## 2014-03-15 NOTE — ED Provider Notes (Signed)
Pt seen and evaluated.  Discussed with  Dr. Margreta JourneyGunalda.  Patient missed dialysis because of the difficulty breathing. Wheezing on exam here. Improving somewhat with nebulized albuterol. Potassium elevated at 7.0. No rhythm disturbance. Does not appear to be in CHF clinically. Plan admission for dialysis for the hyperkalemia. Nebulized albuterol and IV steroids here.  Rolland PorterMark Dereon Williamsen, MD 03/15/14 1911

## 2014-03-15 NOTE — ED Notes (Signed)
Pt undressed, in gown, on monitor, continuous pulse oximetry, blood pressure cuff and oxygen [place] (2L); family at beside

## 2014-03-16 ENCOUNTER — Encounter (HOSPITAL_COMMUNITY): Payer: Self-pay | Admitting: *Deleted

## 2014-03-16 LAB — RENAL FUNCTION PANEL
Albumin: 3 g/dL — ABNORMAL LOW (ref 3.5–5.2)
Anion gap: 15 (ref 5–15)
BUN: 29 mg/dL — ABNORMAL HIGH (ref 6–23)
CALCIUM: 8.7 mg/dL (ref 8.4–10.5)
CO2: 27 meq/L (ref 19–32)
Chloride: 96 mEq/L (ref 96–112)
Creatinine, Ser: 5.08 mg/dL — ABNORMAL HIGH (ref 0.50–1.10)
GFR calc Af Amer: 8 mL/min — ABNORMAL LOW (ref >90)
GFR, EST NON AFRICAN AMERICAN: 7 mL/min — AB (ref >90)
Glucose, Bld: 83 mg/dL (ref 70–99)
PHOSPHORUS: 6 mg/dL — AB (ref 2.3–4.6)
Potassium: 5.4 mEq/L — ABNORMAL HIGH (ref 3.7–5.3)
SODIUM: 138 meq/L (ref 137–147)

## 2014-03-16 LAB — CBC
HEMATOCRIT: 39.7 % (ref 36.0–46.0)
HEMOGLOBIN: 12.3 g/dL (ref 12.0–15.0)
MCH: 33.8 pg (ref 26.0–34.0)
MCHC: 31 g/dL (ref 30.0–36.0)
MCV: 109.1 fL — ABNORMAL HIGH (ref 78.0–100.0)
Platelets: 115 10*3/uL — ABNORMAL LOW (ref 150–400)
RBC: 3.64 MIL/uL — AB (ref 3.87–5.11)
RDW: 17.4 % — ABNORMAL HIGH (ref 11.5–15.5)
WBC: 5.4 10*3/uL (ref 4.0–10.5)

## 2014-03-16 LAB — HEPATITIS B SURFACE ANTIGEN: HEP B S AG: NEGATIVE

## 2014-03-16 MED ORDER — INFLUENZA VAC SPLIT QUAD 0.5 ML IM SUSY
0.5000 mL | PREFILLED_SYRINGE | INTRAMUSCULAR | Status: DC
  Filled 2014-03-16: qty 0.5

## 2014-03-16 MED ORDER — CETYLPYRIDINIUM CHLORIDE 0.05 % MT LIQD
7.0000 mL | Freq: Two times a day (BID) | OROMUCOSAL | Status: DC
  Administered 2014-03-17 – 2014-03-19 (×4): 7 mL via OROMUCOSAL

## 2014-03-16 MED ORDER — BOOST / RESOURCE BREEZE PO LIQD
1.0000 | Freq: Two times a day (BID) | ORAL | Status: DC
  Administered 2014-03-16 – 2014-03-18 (×3): 1 via ORAL

## 2014-03-16 MED ORDER — PRO-STAT SUGAR FREE PO LIQD
30.0000 mL | Freq: Every day | ORAL | Status: DC
  Administered 2014-03-16 – 2014-03-18 (×3): 30 mL via ORAL
  Filled 2014-03-16 (×3): qty 30

## 2014-03-16 MED ORDER — OXYCODONE-ACETAMINOPHEN 5-325 MG PO TABS
1.0000 | ORAL_TABLET | Freq: Three times a day (TID) | ORAL | Status: DC | PRN
  Administered 2014-03-16 – 2014-03-19 (×5): 1 via ORAL
  Filled 2014-03-16 (×4): qty 1

## 2014-03-16 MED ORDER — OXYCODONE HCL 5 MG PO TABS
2.5000 mg | ORAL_TABLET | Freq: Three times a day (TID) | ORAL | Status: DC | PRN
  Administered 2014-03-16 – 2014-03-19 (×4): 2.5 mg via ORAL
  Filled 2014-03-16 (×4): qty 1

## 2014-03-16 MED ORDER — MEGESTROL ACETATE 400 MG/10ML PO SUSP
600.0000 mg | Freq: Every day | ORAL | Status: DC
  Administered 2014-03-16 – 2014-03-19 (×4): 600 mg via ORAL
  Filled 2014-03-16 (×5): qty 15

## 2014-03-16 NOTE — Progress Notes (Signed)
UR completed 

## 2014-03-16 NOTE — Progress Notes (Signed)
Subjective:  Still with frequent nonproductive cough, no improvement with dialysis-( took 4 liters off) , weakness, no appetite- refuses to believe that this has anything to do with her missing HD  Objective: Vital signs in last 24 hours: Temp:  [97.2 F (36.2 C)-99.3 F (37.4 C)] 99.3 F (37.4 C) (10/14 0435) Pulse Rate:  [75-80] 78 (10/14 0435) Resp:  [12-24] 20 (10/14 0435) BP: (104-134)/(51-68) 131/51 mmHg (10/14 0435) SpO2:  [84 %-99 %] 98 % (10/14 0435) Weight:  [45.36 kg (100 lb)-46.584 kg (102 lb 11.2 oz)] 46.584 kg (102 lb 11.2 oz) (10/14 0036) Weight change:   Intake/Output from previous day: 10/13 0701 - 10/14 0700 In: -  Out: 3968  Intake/Output this shift:   Lab Results:  Recent Labs  03/15/14 1655 03/16/14 0423  WBC 6.5 5.4  HGB 12.7 12.3  HCT 40.9 39.7  PLT 126* 115*   BMET:  Recent Labs  03/15/14 1655 03/16/14 0423  NA 135* 138  K 7.0* 5.4*  CL 91* 96  CO2 25 27  GLUCOSE 100* 83  BUN 57* 29*  CREATININE 7.68* 5.08*  CALCIUM 8.4 8.7  ALBUMIN 3.3* 3.0*   No results found for this basename: PTH,  in the last 72 hours Iron Studies: No results found for this basename: IRON, TIBC, TRANSFERRIN, FERRITIN,  in the last 72 hours  Studies/Results: Dg Chest 2 View  03/15/2014   CLINICAL DATA:  COUGH ABDOMINAL PAIN cough.  Shortness of breath.  EXAM: CHEST  2 VIEW  COMPARISON:  CT abdomen 01/31/2014. Multiple chest radiographs dating back to 06/07/2013.  FINDINGS: Cardiomegaly. Median sternotomy/ CABG. Pulmonary vascular congestion is present. Diffuse interstitial prominence likely rib relates to chronic congestive heart failure. Bilateral pleural apical thickening. Thoracic compression fractures present. Coronary artery stents. Two lead RIGHT subclavian cardiac pacemaker leads are unchanged. Calcified av fistula is present in the LEFT arm.  IMPRESSION: Chronic cardiomegaly and pulmonary vascular congestion. No interval change or acute abnormality.    Electronically Signed   By: Andreas NewportGeoffrey  Lamke M.D.   On: 03/15/2014 18:47   EXAM: General appearance:  Alert, frail, in no apparent distress Resp:  Scattered expiratory wheezes bilaterally Cardio:  RRR without murmur or rub GI: + BS, soft and nontender Extremities:  Trace edema on L, none on R Access:  AVF @ LUA with + bruit  Dialysis Prescription   [facility] MWF  3.5 hours (never dialyzes more than 2.5 hours)  180 dialyzer     BFR 350     2K2.25 Ca     UF profile 2      AVF left  Venofer 50 QWed, no Aranesp or vitamin D  Assessment/Plan: 1. Cough/Dyspnea - Likely CHF & COPD, on steroids, Doxycycline, s/p net UF 4 L per HD last night.  Per primary. On 2 liters 2. ESRD - HD on MWF @ [facility] via AVF, missed Monday- done Tuesday, average outpatient time 2.5 hrs, K down from 7 to 5.4.  Next HD tomorrow- off schedule. 3. Dialysis access - prolonged bleeding prior to last fistulogram 11/2013 (per center), AFs stable. 4. CAD / ischemic CM / Hx CABG / TR related to presumed R sided endocarditis (12/2013) - consider echo. 5. Anemia - Hgb 12.3, no Aranesp or Fe. 6. Sec HPT - Ca 8.7 (9.5 corrected), P 6; no Hectorol, Sensipar 60 mg qd, no binders. 7. Nutrition - Alb 3, renal diet, Megace.   LOS: 1 day   LYLES,CHARLES 03/16/2014,9:17 AM  Patient seen and examined, agree  with above note with above modifications. Patient told her family that she did not fell any better but told me that she does.  Refuses to believe that any of her symptoms could be caused by inadequate HD- plan for HD tomorrow- off schedule Maria SableKellie Makalia Bare, MD 03/16/2014

## 2014-03-16 NOTE — Progress Notes (Signed)
PROGRESS NOTE  Maria LefevreVera M Fors NWG:[id number] DOB: 06-Sep-1932 DOA: 03/15/2014 PCP: Galvin ProfferHAGUE, IMRAN P, MD  [name] is a 80 y.o. female with ESRD, COPD, ICM EF 15-20%, missed her dialysis session yesterday due to being SOB with wheezing. Breathing treatment at home provided no relief. Unfortunatly today her SOB became worse with wet sounding breath sounds and cough. As a result she is brought in to ED by family  Assessment/Plan: COPD exacerbation  Adult wheeze protocol Solumedrol-wean as tolerated Doxycycline On 2L O2 at home (3L here)  Non-complaince with renal dialysis leading to exacerbation of CHF (baseline EF of only 15-20%)  hyperkalemia of 7.0  S/p Dialysis with improvement of K  Acute on chronic respiratory failure -wean O2 as tolerated  Code Status: full Family Communication: patient Disposition Plan:    Consultants:  renal  Procedures:      HPI/Subjective: Breathing improved  Objective: Filed Vitals:   03/16/14 1000  BP: 127/45  Pulse: 79  Temp: 98.6 F (37 C)  Resp: 20    Intake/Output Summary (Last 24 hours) at 03/16/14 1042 Last data filed at 03/16/14 0900  Gross per 24 hour  Intake    120 ml  Output   3968 ml  Net  -3848 ml   Filed Weights   03/15/14 1629 03/16/14 0036  Weight: 45.36 kg (100 lb) 46.584 kg (102 lb 11.2 oz)    Exam:   General:  A+Ox3, NAD  Cardiovascular: rrr  Respiratory: diminished, no wheezing heard  Abdomen: +BS, soft  Musculoskeletal: no edema   Data Reviewed: Basic Metabolic Panel:  Recent Labs Lab 03/15/14 1655 03/16/14 0423  NA 135* 138  K 7.0* 5.4*  CL 91* 96  CO2 25 27  GLUCOSE 100* 83  BUN 57* 29*  CREATININE 7.68* 5.08*  CALCIUM 8.4 8.7  PHOS  --  6.0*   Liver Function Tests:  Recent Labs Lab 03/15/14 1655 03/16/14 0423  AST 33  --   ALT 21  --   ALKPHOS 165*  --   BILITOT 0.3  --   PROT 7.2  --   ALBUMIN 3.3* 3.0*   No results found for this basename: LIPASE, AMYLASE,  in the  last 168 hours No results found for this basename: AMMONIA,  in the last 168 hours CBC:  Recent Labs Lab 03/15/14 1655 03/16/14 0423  WBC 6.5 5.4  NEUTROABS 5.3  --   HGB 12.7 12.3  HCT 40.9 39.7  MCV 109.4* 109.1*  PLT 126* 115*   Cardiac Enzymes: No results found for this basename: CKTOTAL, CKMB, CKMBINDEX, TROPONINI,  in the last 168 hours BNP (last 3 results) No results found for this basename: PROBNP,  in the last 8760 hours CBG: No results found for this basename: GLUCAP,  in the last 168 hours  No results found for this or any previous visit (from the past 240 hour(s)).   Studies: Dg Chest 2 View  03/15/2014   CLINICAL DATA:  COUGH ABDOMINAL PAIN cough.  Shortness of breath.  EXAM: CHEST  2 VIEW  COMPARISON:  CT abdomen 01/31/2014. Multiple chest radiographs dating back to 06/07/2013.  FINDINGS: Cardiomegaly. Median sternotomy/ CABG. Pulmonary vascular congestion is present. Diffuse interstitial prominence likely rib relates to chronic congestive heart failure. Bilateral pleural apical thickening. Thoracic compression fractures present. Coronary artery stents. Two lead RIGHT subclavian cardiac pacemaker leads are unchanged. Calcified av fistula is present in the LEFT arm.  IMPRESSION: Chronic cardiomegaly and pulmonary vascular congestion. No interval change or  acute abnormality.   Electronically Signed   By: Andreas NewportGeoffrey  Lamke M.D.   On: 03/15/2014 18:47    Scheduled Meds: . aspirin EC  81 mg Oral Daily  . atorvastatin  80 mg Oral Daily  . cinacalcet  60 mg Oral Q breakfast  . docusate sodium  100 mg Oral Daily  . doxycycline  100 mg Oral Q12H  . folic acid  400 mcg Oral Daily  . furosemide  80 mg Oral BID  . heparin  5,000 Units Subcutaneous 3 times per day  . hydrALAZINE  25 mg Oral TID  . [START ON 03/17/2014] Influenza vac split quadrivalent PF  0.5 mL Intramuscular Tomorrow-1000  . levothyroxine  150 mcg Oral QAC breakfast  . megestrol  600 mg Oral Daily  .  methylPREDNISolone (SOLU-MEDROL) injection  60 mg Intravenous Q12H  . olopatadine  1 drop Both Eyes BID  . polycarbophil  625 mg Oral Daily  . sodium chloride  3 mL Intravenous Q12H   Continuous Infusions:  Antibiotics Given (last 72 hours)   Date/Time Action Medication Dose   03/16/14 1031 Given   doxycycline (VIBRA-TABS) tablet 100 mg 100 mg      Principal Problem:   Noncompliance with renal dialysis Active Problems:   ESRD (end stage renal disease) on dialysis   Pulmonary edema   Patient nonadherence   Hyperkalemia   Acute on chronic systolic CHF (congestive heart failure)   Chronic systolic CHF (congestive heart failure)   COPD exacerbation    Time spent: 35 min    Janice Bodine  [facility] Pager [phone]. If 7PM-7AM, please contact night-coverage at [email or web address], password [email or web address] 03/16/2014, 10:42 AM  LOS: 1 day

## 2014-03-16 NOTE — Progress Notes (Addendum)
INITIAL NUTRITION ASSESSMENT  Pt meets criteria for SEVERE MALNUTRITION in the context of chronic illness as evidenced by a 13.5% weight loss in 9 months, </= 75% energy intake for >/= 1 month, and severe fat and muscle mass loss.  DOCUMENTATION CODES Per approved criteria  -Severe malnutrition in the context of chronic illness -Underweight   INTERVENTION: Provide Resource Breeze po BID, each supplement provides 250 kcal and 9 grams of protein.  Provide 30 ml Prostat po once daily, each supplement provides 100 kcal and 15 grams of protein.  Encourage PO intake.  NUTRITION DIAGNOSIS: Increased nutrient needs related to chronic illness, ESRD as evidenced by estimated nutrition needs.   Goal: Pt to meet >/= 90% of their estimated nutrition needs   Monitor:  PO intake, weight trends, labs, I/O's  Reason for Assessment: Low BMI  80 y.o. female  Admitting Dx: Noncompliance with renal dialysis  ASSESSMENT: Pt with ESRD, COPD, ICM EF 15-20%, missed her dialysis session 10/12 due to being SOB with wheezing. Breathing treatment at home provided no relief. Unfortunately her SOB became worse with wet sounding breath sounds and cough.  Pt reports having a decreased appetite over the past 3-4 months. Pt reports she has also been eating less with her food consumption consisting mostly of snacking and bites and parts of sandwiches, but still eating 3 times a day. Pt reports she can not recall the last time she has had a full meal. Pt reports she has lost weight with her usual body weight of 124 lbs, which she reported she weighed 4-5 months ago. Noted pt with a 13.5% weight loss in 9 months. Pt does not drink any oral supplements at home. Pt was educated to start drinking supplements to help provide extra calories and protein that she needs, especially when she is not eating well. Current meal completion is 60%. Pt reports she does not like Nepro, but is willing to try [employer] Breeze. Pt report  she also takes Prostat at her dialysis center. Will order.  Nutrition Focused Physical Exam:  Subcutaneous Fat:  Orbital Region: N/A Upper Arm Region: Severe depletion Thoracic and Lumbar Region: Severe depletion  Muscle:  Temple Region: Moderate depletion Clavicle Bone Region: Severe depletion Clavicle and Acromion Bone Region: Severe depletion Scapular Bone Region: N/A Dorsal Hand: Severe depletion Patellar Region: Severe depletion Anterior Thigh Region: Severe depletion Posterior Calf Region: Moderate depletion  Edema: +2 LE  Labs: High BUN, creatinine, potassium (5.4) and phosphorous (6).  Height: Ht Readings from Last 1 Encounters:  03/16/14 5\' 3"  (1.6 m)    Weight: Wt Readings from Last 1 Encounters:  03/16/14 102 lb 11.2 oz (46.584 kg)    Ideal Body Weight: 115 lbs  % Ideal Body Weight: 89%  Wt Readings from Last 10 Encounters:  03/16/14 102 lb 11.2 oz (46.584 kg)  12/03/13 106 lb 0.7 oz (48.1 kg)  12/03/13 106 lb 0.7 oz (48.1 kg)  11/15/13 97 lb (43.999 kg)  06/11/13 108 lb (48.988 kg)  06/07/13 118 lb 6.2 oz (53.7 kg)  07/06/12 107 lb 9.4 oz (48.8 kg)  07/06/12 107 lb 9.4 oz (48.8 kg)  06/26/11 117 lb 11.6 oz (53.4 kg)  09/03/10 119 lb 6.4 oz (54.159 kg)    Usual Body Weight: 124 lbs  % Usual Body Weight: 82%  BMI:  Body mass index is 18.2 kg/(m^2). Underweight  Adjusted Body weight: 51.18 kg  Estimated Nutritional Needs: Kcal: 1550 - 1800 kcal  Protein: at least 66 g protein  Fluid: 1.2 liters  Skin: +2 LE edema  Diet Order: Renal with 1200 ml fluids  EDUCATION NEEDS: -Education needs addressed   Intake/Output Summary (Last 24 hours) at 03/16/14 1322 Last data filed at 03/16/14 0900  Gross per 24 hour  Intake    120 ml  Output   3968 ml  Net  -3848 ml    Last BM: 10/12  Labs:   Recent Labs Lab 03/15/14 1655 03/16/14 0423  NA 135* 138  K 7.0* 5.4*  CL 91* 96  CO2 25 27  BUN 57* 29*  CREATININE 7.68* 5.08*  CALCIUM  8.4 8.7  PHOS  --  6.0*  GLUCOSE 100* 83    CBG (last 3)  No results found for this basename: GLUCAP,  in the last 72 hours  Scheduled Meds: . aspirin EC  81 mg Oral Daily  . atorvastatin  80 mg Oral Daily  . cinacalcet  60 mg Oral Q breakfast  . docusate sodium  100 mg Oral Daily  . doxycycline  100 mg Oral Q12H  . folic acid  400 mcg Oral Daily  . furosemide  80 mg Oral BID  . heparin  5,000 Units Subcutaneous 3 times per day  . hydrALAZINE  25 mg Oral TID  . [START ON 03/17/2014] Influenza vac split quadrivalent PF  0.5 mL Intramuscular Tomorrow-1000  . levothyroxine  150 mcg Oral QAC breakfast  . megestrol  600 mg Oral Daily  . methylPREDNISolone (SOLU-MEDROL) injection  60 mg Intravenous Q12H  . olopatadine  1 drop Both Eyes BID  . polycarbophil  625 mg Oral Daily  . sodium chloride  3 mL Intravenous Q12H    Continuous Infusions:   Past Medical History  Diagnosis Date  . Carotid artery occlusion     Bilateral carotid artery disease  . Peripheral artery disease   . CHF (congestive heart failure)     EF 15-20% on TEE 12/15/13  . Hypertension   . Hyperlipidemia   . ESRD on hemodialysis 06/20/11    M, W, F; [address], [place].  Started HD in Sept 2008  . AAA (abdominal aortic aneurysm)   . Stroke 2007  . Duodenal ulcer 2008  . S/P CABG (coronary artery bypass graft)     CABG in 2008, had 3 MI's prior  . Depression   . Pneumonia   . COPD, severe   . Hypothyroidism   . Anemia   . Pacemaker   . Atrial fibrillation   . Complete heart block     Past Surgical History  Procedure Laterality Date  . Thyroid surgery  2004  . Dg av dialysis  shunt access exist*l* or  03/2000    left upper arm  . Vaginal hysterectomy  1976  . Hemorrhoid surgery  1971  . Insert / replace / remove pacemaker  1990's    initial placement  . Insert / replace / remove pacemaker  05/2011    MDT ADDRL1 pacemaker implanted by Dr Dulce Sellar  . Incisional hernia repair  10/2010    incarcerated   . Common iliac  06/2009    bilateral kissing stents  . Pseudoaneurysm repair  09/1999    right groin  . Av fistula repair  04/2007    left upper arm  . Cardiac catheterization  02/10, 03/12    Most recent showed 3 vessel CAD with patent grafts: SVG to LAD, SVG to PDA and SVG to OM  . Coronary angioplasty with stent placement  09/1999    "  1"  . Coronary artery bypass graft  02/2007    CABG X3  . Tee without cardioversion N/A 12/02/2013    Procedure: TRANSESOPHAGEAL ECHOCARDIOGRAM (TEE);  Surgeon: Lewayne BuntingBrian S Crenshaw, MD;  [facility];  Service: Cardiovascular;  Laterality: N/A;    Marijean NiemannStephanie La, MS, RD, LDN Pager # [phone] After hours/ weekend pager # [phone]

## 2014-03-17 LAB — RENAL FUNCTION PANEL
Albumin: 2.9 g/dL — ABNORMAL LOW (ref 3.5–5.2)
Anion gap: 16 — ABNORMAL HIGH (ref 5–15)
BUN: 53 mg/dL — AB (ref 6–23)
CHLORIDE: 95 meq/L — AB (ref 96–112)
CO2: 24 meq/L (ref 19–32)
Calcium: 7.8 mg/dL — ABNORMAL LOW (ref 8.4–10.5)
Creatinine, Ser: 6.36 mg/dL — ABNORMAL HIGH (ref 0.50–1.10)
GFR calc Af Amer: 6 mL/min — ABNORMAL LOW (ref >90)
GFR, EST NON AFRICAN AMERICAN: 6 mL/min — AB (ref >90)
Glucose, Bld: 113 mg/dL — ABNORMAL HIGH (ref 70–99)
Phosphorus: 7.3 mg/dL — ABNORMAL HIGH (ref 2.3–4.6)
Potassium: 6.5 mEq/L (ref 3.7–5.3)
Sodium: 135 mEq/L — ABNORMAL LOW (ref 137–147)

## 2014-03-17 LAB — CBC
HEMATOCRIT: 38.2 % (ref 36.0–46.0)
HEMOGLOBIN: 11.7 g/dL — AB (ref 12.0–15.0)
MCH: 33.3 pg (ref 26.0–34.0)
MCHC: 30.6 g/dL (ref 30.0–36.0)
MCV: 108.8 fL — AB (ref 78.0–100.0)
Platelets: 112 10*3/uL — ABNORMAL LOW (ref 150–400)
RBC: 3.51 MIL/uL — ABNORMAL LOW (ref 3.87–5.11)
RDW: 17.3 % — ABNORMAL HIGH (ref 11.5–15.5)
WBC: 7.5 10*3/uL (ref 4.0–10.5)

## 2014-03-17 MED ORDER — OXYCODONE-ACETAMINOPHEN 5-325 MG PO TABS
ORAL_TABLET | ORAL | Status: AC
  Filled 2014-03-17: qty 1

## 2014-03-17 MED ORDER — BISACODYL 5 MG PO TBEC
10.0000 mg | DELAYED_RELEASE_TABLET | Freq: Every day | ORAL | Status: DC | PRN
  Administered 2014-03-17: 10 mg via ORAL
  Filled 2014-03-17: qty 2

## 2014-03-17 MED ORDER — ALBUTEROL SULFATE (2.5 MG/3ML) 0.083% IN NEBU
2.5000 mg | INHALATION_SOLUTION | RESPIRATORY_TRACT | Status: DC | PRN
  Administered 2014-03-17 – 2014-03-18 (×2): 2.5 mg via RESPIRATORY_TRACT
  Filled 2014-03-17: qty 3

## 2014-03-17 MED ORDER — HYDROCORTISONE 2.5 % RE CREA
TOPICAL_CREAM | Freq: Two times a day (BID) | RECTAL | Status: DC
  Administered 2014-03-17 – 2014-03-18 (×2): via RECTAL
  Administered 2014-03-18: 1 via RECTAL
  Administered 2014-03-19: 12:00:00 via RECTAL
  Filled 2014-03-17: qty 28.35

## 2014-03-17 MED ORDER — SORBITOL 70 % SOLN
30.0000 mL | Freq: Once | Status: AC
  Administered 2014-03-17: 30 mL via ORAL
  Filled 2014-03-17: qty 30

## 2014-03-17 MED ORDER — ALBUTEROL SULFATE (2.5 MG/3ML) 0.083% IN NEBU
INHALATION_SOLUTION | RESPIRATORY_TRACT | Status: AC
  Filled 2014-03-17: qty 3

## 2014-03-17 MED ORDER — CALCIUM ACETATE 667 MG PO CAPS
1334.0000 mg | ORAL_CAPSULE | Freq: Three times a day (TID) | ORAL | Status: DC
  Administered 2014-03-17 – 2014-03-19 (×5): 1334 mg via ORAL
  Filled 2014-03-17 (×9): qty 2

## 2014-03-17 MED ORDER — BISACODYL 10 MG RE SUPP: 10.0000 mg | Freq: Every day | RECTAL | Status: DC | PRN

## 2014-03-17 NOTE — Evaluation (Signed)
Physical Therapy Evaluation Patient Details Name: [name] MRN: [id number] DOB: [birth year] Today's Date: 03/17/2014   History of Present Illness  Maria LefevreVera M Poppell is a 80 y.o. female with ESRD, COPD, ICM EF 15-20%, missed her dialysis session yesterday due to being SOB with wheezing.  Breathing treatment at home provided no relief.  Unfortunatly today her SOB became worse with wet sounding breath sounds and cough.  As a result she is brought in to ED by family  Clinical Impression  Pt admitted with/for chf.  Pt currently limited functionally due to the problems listed below.  (see problems list.)  Pt will benefit from PT to maximize function and safety to be able to get home safely with available assist from PCA and family.     Follow Up Recommendations Home health PT;Supervision for mobility/OOB    Equipment Recommendations  None recommended by PT    Recommendations for Other Services       Precautions / Restrictions Precautions Precautions: Fall Restrictions Weight Bearing Restrictions: No      Mobility  Bed Mobility Overal bed mobility: Needs Assistance Bed Mobility: Supine to Sit;Sit to Supine     Supine to sit: Min assist Sit to supine: Min guard   General bed mobility comments: given time could get in/out of bed without assist  Transfers Overall transfer level: Needs assistance   Transfers: Sit to/from Stand Sit to Stand: Min assist         General transfer comment: cues for hand placement  Ambulation/Gait Ambulation/Gait assistance: Min assist Ambulation Distance (Feet): 90 Feet Assistive device: 1 person hand held assist (and rail, no RW available) Gait Pattern/deviations: Step-through pattern Gait velocity: slow Gait velocity interpretation: Below normal speed for age/gender General Gait Details: mildly unsteady overall, relying moderately on assist and reaching for the rails.  Stairs            Wheelchair Mobility    Modified Rankin  (Stroke Patients Only)       Balance Overall balance assessment: Needs assistance   Sitting balance-Leahy Scale: Fair       Standing balance-Leahy Scale: Poor                               Pertinent Vitals/Pain Pain Assessment: No/denies pain    Home Living Family/patient expects to be discharged to:: Private residence Living Arrangements: Alone Available Help at Discharge: Personal care attendant;Family;Available 24 hours/day (PCA 6 hrs/ 6 days/week) Type of Home: House Home Access: Ramped entrance     Home Layout: One level Home Equipment: Shower seat;Grab bars - tub/shower;Hand held shower head;Walker - 2 wheels;Bedside commode      Prior Function Level of Independence: Needs assistance   Gait / Transfers Assistance Needed: uses a RW with supervision  ADL's / Homemaking Assistance Needed: Caregiver helps with housekeeping, meals, and personal care.  Comments: daughter lives next door     Hand Dominance   Dominant Hand: Right    Extremity/Trunk Assessment   Upper Extremity Assessment: Generalized weakness           Lower Extremity Assessment: Generalized weakness         Communication   Communication: No difficulties  Cognition   Behavior During Therapy: WFL for tasks assessed/performed;Anxious Overall Cognitive Status: Within Functional Limits for tasks assessed                      General Comments General  comments (skin integrity, edema, etc.): on 2 L O2 sats maintained in the mid 90's with gait.  Mild dyspnea only    Exercises        Assessment/Plan    PT Assessment Patient needs continued PT services  PT Diagnosis Difficulty walking;Other (comment) (decreased activity tolerance)   PT Problem List Decreased strength;Decreased activity tolerance;Decreased balance;Decreased mobility;Decreased knowledge of use of DME;Cardiopulmonary status limiting activity  PT Treatment Interventions DME instruction;Gait  training;Functional mobility training;Therapeutic activities;Balance training;Patient/family education   PT Goals (Current goals can be found in the Care Plan section) Acute Rehab PT Goals Patient Stated Goal: get home PT Goal Formulation: With patient Time For Goal Achievement: 03/17/14 Potential to Achieve Goals: Good    Frequency Min 3X/week   Barriers to discharge        Co-evaluation               End of Session Equipment Utilized During Treatment: Oxygen Activity Tolerance: Patient tolerated treatment well Patient left: in bed;with call bell/phone within reach Nurse Communication: Mobility status         Time: 1236-1300 PT Time Calculation (min): 24 min   Charges:   PT Evaluation $Initial PT Evaluation Tier I: 1 Procedure PT Treatments $Gait Training: 8-22 mins   PT G Codes:          Spirit Wernli, Eliseo GumKenneth V 03/17/2014, 1:13 PM 03/17/2014  Camas BingKen Isacc Turney, PT [phone]  (pager)

## 2014-03-17 NOTE — Progress Notes (Signed)
Pt c/o abd pain d/t constipation per pt. Notified on call, K. Shorr NP. New orders received.

## 2014-03-17 NOTE — Progress Notes (Signed)
[facility] Progress Note  Assessment/Plan: 1. Cough/Dyspnea secondary to CHF & COPD, on steroids, Doxycycline, s/p net UF 4 L per HD Tues; goal today  On 2 liters; d/c lasix ( rarely makes urine)--she has been getting to this edw at times without running full time??? Seems EDW is about right and we will likely not be able to go much lower 2. ESRD - HD on MWF @ [facility] via AVF, missed Monday- done Tuesday, average outpatient time 2.5 hrs, K down from 7 to 5.4 yesterday but up to 6.5 today - will dialyze with 1 hr 1 K today and then 2 hr 2K - run 4 hour total today- off schedule and repeat labs in am 3. Dialysis access - prolonged bleeding prior to last fistulogram 11/2013 (per center), AFs stable. 4. CAD / ischemic CM / Hx CABG / TR related to presumed R sided endocarditis (12/2013): has right subclavian pacemaker 5. Anemia - Hgb 11.7, no Aranesp - on venofer 50 per week as outpt - not given here - last Aranesp dose 80 on 10/7 6. Sec HPT - P elevated - not on binders; no Hectorol, Sensipar 60 mg qd, - on 2 phoslo ac - will resume 7. Nutrition - Alb 2.9, renal diet, Megace/prostat/resource. 8. Constipation - on variety of meds for this - add sorbitol x 1 today 9. Dispo- not sure what end point will be- ?PT consult to help with all over pain ?  Sheffield SliderMartha B Bergman, PA-C [facility] Beeper [phone] 03/17/2014,8:09 AM  LOS: 2 days   Patient seen and examined, agree with above note with above modifications. Pt seen on HD- c/o pain all over- breathing is about the same- I think EDW is close to right- her breathing issues are just due to unhealthy lungs.  Not sure what end point of hosp would be- I have ordered PT consult Annie SableKellie Motty Borin, MD 03/17/2014     Subjective:   C/o abdominal pain, had enema without results this am, uses suppositories and a variety of lax at home. Hurting all over- breathing is about the same   Objective Filed Vitals:   03/16/14 2136 03/17/14  0416 03/17/14 0654 03/17/14 0800  BP: 122/51 140/62 134/57 135/57  Pulse: 76 77 75 75  Temp: 98.2 F (36.8 C) 98.6 F (37 C) 98.7 F (37.1 C)   TempSrc: Oral Oral Oral   Resp: 17 18 20 13   Height:      Weight:   48.4 kg (106 lb 11.2 oz)   SpO2: 100% 100% 100% 100%   Physical Exam  ^ goal to 4 L General: supine breathing easily  Heart: RRR Lungs: diffuse wheezes with coarse BS Abdomen: soft NT Extremities: 1+ left LE edema - none on right Dialysis Access: left upper AVF Qb 400   Dialysis Orders:   MWF EDW 44.5  3.5 hours (never dialyzes more than 2.5 hours)  180 dialyzer BFR 350 2K2.25 Ca UF profile 2 AVF left  Venofer 50 QWed, no Aranesp or vitamin D   Additional Objective Labs: Basic Metabolic Panel:  Recent Labs Lab 03/15/14 1655 03/16/14 0423 03/17/14 0711  NA 135* 138 135*  K 7.0* 5.4* 6.5*  CL 91* 96 95*  CO2 25 27 24   GLUCOSE 100* 83 113*  BUN 57* 29* 53*  CREATININE 7.68* 5.08* 6.36*  CALCIUM 8.4 8.7 7.8*  PHOS  --  6.0* 7.3*   Liver Function Tests:  Recent Labs Lab 03/15/14 1655 03/16/14 0423 03/17/14 [id number]  AST 33  --   --   ALT 21  --   --   ALKPHOS 165*  --   --   BILITOT 0.3  --   --   PROT 7.2  --   --   ALBUMIN 3.3* 3.0* 2.9*  CBC:  Recent Labs Lab 03/15/14 1655 03/16/14 0423 03/17/14 0711  WBC 6.5 5.4 7.5  NEUTROABS 5.3  --   --   HGB 12.7 12.3 11.7*  HCT 40.9 39.7 38.2  MCV 109.4* 109.1* 108.8*  PLT 126* 115* 112*  Studies/Results: Dg Chest 2 View  03/15/2014   CLINICAL DATA:  COUGH ABDOMINAL PAIN cough.  Shortness of breath.  EXAM: CHEST  2 VIEW  COMPARISON:  CT abdomen 01/31/2014. Multiple chest radiographs dating back to 06/07/2013.  FINDINGS: Cardiomegaly. Median sternotomy/ CABG. Pulmonary vascular congestion is present. Diffuse interstitial prominence likely rib relates to chronic congestive heart failure. Bilateral pleural apical thickening. Thoracic compression fractures present. Coronary artery stents. Two lead  RIGHT subclavian cardiac pacemaker leads are unchanged. Calcified av fistula is present in the LEFT arm.  IMPRESSION: Chronic cardiomegaly and pulmonary vascular congestion. No interval change or acute abnormality.   Electronically Signed   By: Andreas NewportGeoffrey  Lamke M.D.   On: 03/15/2014 18:47   Medications:   . antiseptic oral rinse  7 mL Mouth Rinse BID  . aspirin EC  81 mg Oral Daily  . atorvastatin  80 mg Oral Daily  . cinacalcet  60 mg Oral Q breakfast  . docusate sodium  100 mg Oral Daily  . doxycycline  100 mg Oral Q12H  . feeding supplement (PRO-STAT SUGAR FREE 64)  30 mL Oral Q1500  . feeding supplement (RESOURCE BREEZE)  1 Container Oral BID BM  . folic acid  400 mcg Oral Daily  . furosemide  80 mg Oral BID  . heparin  5,000 Units Subcutaneous 3 times per day  . hydrALAZINE  25 mg Oral TID  . Influenza vac split quadrivalent PF  0.5 mL Intramuscular Tomorrow-1000  . levothyroxine  150 mcg Oral QAC breakfast  . megestrol  600 mg Oral Daily  . methylPREDNISolone (SOLU-MEDROL) injection  60 mg Intravenous Q12H  . olopatadine  1 drop Both Eyes BID  . polycarbophil  625 mg Oral Daily  . sodium chloride  3 mL Intravenous Q12H

## 2014-03-17 NOTE — Progress Notes (Signed)
PROGRESS NOTE  Maria LefevreVera M Mcwherter WUJ:[id number] DOB: 08/13/1932 DOA: 03/15/2014 PCP: Galvin ProfferHAGUE, IMRAN P, MD  [name] is a 80 y.o. female with ESRD, COPD, ICM EF 15-20%, missed her dialysis session yesterday due to being SOB with wheezing. Breathing treatment at home provided no relief. Unfortunatly today her SOB became worse with wet sounding breath sounds and cough. As a result she is brought in to ED by family  Assessment/Plan: COPD exacerbation  Adult wheeze protocol Solumedrol-wean as tolerated Doxycycline On 2L O2 at home PRN  Non-complaince with renal dialysis leading to exacerbation of CHF (baseline EF of only 15-20%)  hyperkalemia of 7.0  S/p Dialysis with improvement of K- did not finish course on 10/15  Acute on chronic respiratory failure -wean O2 as tolerated  PT eval  Code Status: full Family Communication: patient Disposition Plan:    Consultants:  renal  Procedures:      HPI/Subjective: Ended dialysis early due to pain   Objective: Filed Vitals:   03/17/14 1025  BP: 137/48  Pulse: 76  Temp: 98 F (36.7 C)  Resp: 16    Intake/Output Summary (Last 24 hours) at 03/17/14 1121 Last data filed at 03/17/14 0954  Gross per 24 hour  Intake     80 ml  Output   2130 ml  Net  -2050 ml   Filed Weights   03/15/14 1629 03/16/14 0036 03/17/14 0654  Weight: 45.36 kg (100 lb) 46.584 kg (102 lb 11.2 oz) 48.4 kg (106 lb 11.2 oz)    Exam:   General:  A+Ox3, NAD  Cardiovascular: rrr  Respiratory: diffuse wheezing  Abdomen: +BS, soft  Musculoskeletal: no edema   Data Reviewed: Basic Metabolic Panel:  Recent Labs Lab 03/15/14 1655 03/16/14 0423 03/17/14 0711  NA 135* 138 135*  K 7.0* 5.4* 6.5*  CL 91* 96 95*  CO2 25 27 24   GLUCOSE 100* 83 113*  BUN 57* 29* 53*  CREATININE 7.68* 5.08* 6.36*  CALCIUM 8.4 8.7 7.8*  PHOS  --  6.0* 7.3*   Liver Function Tests:  Recent Labs Lab 03/15/14 1655 03/16/14 0423 03/17/14 0711  AST 33  --    --   ALT 21  --   --   ALKPHOS 165*  --   --   BILITOT 0.3  --   --   PROT 7.2  --   --   ALBUMIN 3.3* 3.0* 2.9*   No results found for this basename: LIPASE, AMYLASE,  in the last 168 hours No results found for this basename: AMMONIA,  in the last 168 hours CBC:  Recent Labs Lab 03/15/14 1655 03/16/14 0423 03/17/14 0711  WBC 6.5 5.4 7.5  NEUTROABS 5.3  --   --   HGB 12.7 12.3 11.7*  HCT 40.9 39.7 38.2  MCV 109.4* 109.1* 108.8*  PLT 126* 115* 112*   Cardiac Enzymes: No results found for this basename: CKTOTAL, CKMB, CKMBINDEX, TROPONINI,  in the last 168 hours BNP (last 3 results) No results found for this basename: PROBNP,  in the last 8760 hours CBG: No results found for this basename: GLUCAP,  in the last 168 hours  No results found for this or any previous visit (from the past 240 hour(s)).   Studies: Dg Chest 2 View  03/15/2014   CLINICAL DATA:  COUGH ABDOMINAL PAIN cough.  Shortness of breath.  EXAM: CHEST  2 VIEW  COMPARISON:  CT abdomen 01/31/2014. Multiple chest radiographs dating back to 06/07/2013.  FINDINGS: Cardiomegaly. Median  sternotomy/ CABG. Pulmonary vascular congestion is present. Diffuse interstitial prominence likely rib relates to chronic congestive heart failure. Bilateral pleural apical thickening. Thoracic compression fractures present. Coronary artery stents. Two lead RIGHT subclavian cardiac pacemaker leads are unchanged. Calcified av fistula is present in the LEFT arm.  IMPRESSION: Chronic cardiomegaly and pulmonary vascular congestion. No interval change or acute abnormality.   Electronically Signed   By: Andreas NewportGeoffrey  Lamke M.D.   On: 03/15/2014 18:47    Scheduled Meds: . antiseptic oral rinse  7 mL Mouth Rinse BID  . aspirin EC  81 mg Oral Daily  . atorvastatin  80 mg Oral Daily  . calcium acetate  1,334 mg Oral TID WC  . cinacalcet  60 mg Oral Q breakfast  . docusate sodium  100 mg Oral Daily  . doxycycline  100 mg Oral Q12H  . feeding  supplement (PRO-STAT SUGAR FREE 64)  30 mL Oral Q1500  . feeding supplement (RESOURCE BREEZE)  1 Container Oral BID BM  . folic acid  400 mcg Oral Daily  . heparin  5,000 Units Subcutaneous 3 times per day  . hydrALAZINE  25 mg Oral TID  . Influenza vac split quadrivalent PF  0.5 mL Intramuscular Tomorrow-1000  . levothyroxine  150 mcg Oral QAC breakfast  . megestrol  600 mg Oral Daily  . methylPREDNISolone (SOLU-MEDROL) injection  60 mg Intravenous Q12H  . olopatadine  1 drop Both Eyes BID  . oxyCODONE-acetaminophen      . polycarbophil  625 mg Oral Daily  . sodium chloride  3 mL Intravenous Q12H   Continuous Infusions:  Antibiotics Given (last 72 hours)   Date/Time Action Medication Dose   03/16/14 1031 Given   doxycycline (VIBRA-TABS) tablet 100 mg 100 mg   03/16/14 2150 Given   doxycycline (VIBRA-TABS) tablet 100 mg 100 mg   03/17/14 1109 Given   doxycycline (VIBRA-TABS) tablet 100 mg 100 mg      Principal Problem:   Noncompliance with renal dialysis Active Problems:   ESRD (end stage renal disease) on dialysis   Pulmonary edema   Patient nonadherence   Hyperkalemia   Acute on chronic systolic CHF (congestive heart failure)   Chronic systolic CHF (congestive heart failure)   COPD exacerbation    Time spent: 35 min    VANN, JESSICA  [facility] Pager [phone]. If 7PM-7AM, please contact night-coverage at [email or web address], password [email or web address] 03/17/2014, 11:21 AM  LOS: 2 days

## 2014-03-17 NOTE — Progress Notes (Signed)
Lab called with critical lab value. Potassium result 6.5. Maria SettleMartha Bergman PA made aware and has ordered a 1K dialysate bath for one hour then return to 2k dialysate bath for remainder of hemodialysis treatment.

## 2014-03-17 NOTE — Progress Notes (Signed)
Despite urging from Dr Kathrene BongoGoldsborough and hemodialysis  staff, patient has terminated dialysis treatment early. Patient received 168 minutes of her prescribed 240 minute treatment. It has been communicated to the patient that her potassium is elevated and that it is important to complete her treatment. Side effects of hyperkalemia have been explained to the patient as well. Patient expresses understanding of information provided.

## 2014-03-17 NOTE — Progress Notes (Signed)
Successfully obtained a PIV in right anterior forearm on 1st attempt.   While I was charting patient stated,  "It is hurting"  I reassessed it.  Good blood return and flushed 10cc NS without swelling or pain with flushing however she continued to state, "It was hurting".    Removed it and restarted another PIV just above that one.  Denies any c/o pain with flushing and in general.   "That feels better"   Explained to her that the first one may have been next to a nerve bundle causing her pain.  Now comfortable without c/o pain at new site.

## 2014-03-17 NOTE — Progress Notes (Signed)
Patient was scheduled to receive flu vaccine today. Prior to administering the vaccine, pt stated she had already received it this year. Did not administer the vaccine today.  Dhanya Bogle Eileen,RN.

## 2014-03-17 NOTE — Progress Notes (Signed)
Patient had a 5 beat run of Vtach. Asymptomatic. Dr. Benjamine MolaVann notified. No new orders.   Leanna BattlesEckelmann, Adden Strout Eileen, RN.

## 2014-03-17 NOTE — Procedures (Signed)
Patient was seen on dialysis and the procedure was supervised.  BFR 400  Via AVF BP is  135/57.   Patient appears to be tolerating treatment well  Neeraj Housand A 03/17/2014

## 2014-03-18 LAB — RENAL FUNCTION PANEL
Albumin: 2.8 g/dL — ABNORMAL LOW (ref 3.5–5.2)
Anion gap: 13 (ref 5–15)
BUN: 45 mg/dL — ABNORMAL HIGH (ref 6–23)
CO2: 29 mEq/L (ref 19–32)
Calcium: 8.1 mg/dL — ABNORMAL LOW (ref 8.4–10.5)
Chloride: 97 mEq/L (ref 96–112)
Creatinine, Ser: 4.8 mg/dL — ABNORMAL HIGH (ref 0.50–1.10)
GFR calc Af Amer: 9 mL/min — ABNORMAL LOW (ref >90)
GFR calc non Af Amer: 8 mL/min — ABNORMAL LOW (ref >90)
Glucose, Bld: 119 mg/dL — ABNORMAL HIGH (ref 70–99)
Phosphorus: 5.4 mg/dL — ABNORMAL HIGH (ref 2.3–4.6)
Potassium: 4.9 mEq/L (ref 3.7–5.3)
Sodium: 139 mEq/L (ref 137–147)

## 2014-03-18 MED ORDER — PREDNISONE 20 MG PO TABS
40.0000 mg | ORAL_TABLET | Freq: Two times a day (BID) | ORAL | Status: DC
  Administered 2014-03-18 – 2014-03-19 (×2): 40 mg via ORAL
  Filled 2014-03-18 (×3): qty 2

## 2014-03-18 MED ORDER — IPRATROPIUM BROMIDE 0.02 % IN SOLN: 0.5000 mg | Freq: Four times a day (QID) | RESPIRATORY_TRACT | Status: DC

## 2014-03-18 MED ORDER — GUAIFENESIN ER 600 MG PO TB12
600.0000 mg | ORAL_TABLET | Freq: Two times a day (BID) | ORAL | Status: DC
  Administered 2014-03-18 – 2014-03-19 (×2): 600 mg via ORAL
  Filled 2014-03-18 (×3): qty 1

## 2014-03-18 MED ORDER — ALBUTEROL SULFATE (2.5 MG/3ML) 0.083% IN NEBU: 2.5000 mg | INHALATION_SOLUTION | Freq: Four times a day (QID) | RESPIRATORY_TRACT | Status: DC

## 2014-03-18 MED ORDER — IPRATROPIUM-ALBUTEROL 0.5-2.5 (3) MG/3ML IN SOLN
3.0000 mL | Freq: Four times a day (QID) | RESPIRATORY_TRACT | Status: DC
  Filled 2014-03-18 (×2): qty 3

## 2014-03-18 MED ORDER — BENZONATATE 100 MG PO CAPS
100.0000 mg | ORAL_CAPSULE | Freq: Three times a day (TID) | ORAL | Status: DC | PRN
  Administered 2014-03-18 – 2014-03-19 (×2): 100 mg via ORAL
  Filled 2014-03-18 (×2): qty 1

## 2014-03-18 NOTE — Progress Notes (Signed)
Subjective:  Feeling better today, breathing better with less cough, appetite improving  Objective: Vital signs in last 24 hours: Temp:  [97.8 F (36.6 C)-98.4 F (36.9 C)] 98.4 F (36.9 C) (10/16 0500) Pulse Rate:  [75-83] 75 (10/16 0500) Resp:  [11-18] 18 (10/16 0500) BP: (119-137)/(48-60) 126/56 mmHg (10/16 0500) SpO2:  [98 %-100 %] 100 % (10/16 0500) Weight:  [46.2 kg (101 lb 13.6 oz)] 46.2 kg (101 lb 13.6 oz) (10/15 2100) Weight change: -2.2 kg (-4 lb 13.6 oz)  Intake/Output from previous day: 10/15 0701 - 10/16 0700 In: 220 [P.O.:220] Out: 2130  Intake/Output this shift:   Lab Results:  Recent Labs  03/16/14 0423 03/17/14 0711  WBC 5.4 7.5  HGB 12.3 11.7*  HCT 39.7 38.2  PLT 115* 112*   BMET:  Recent Labs  03/17/14 0711 03/18/14 0500  NA 135* 139  K 6.5* 4.9  CL 95* 97  CO2 24 29  GLUCOSE 113* 119*  BUN 53* 45*  CREATININE 6.36* 4.80*  CALCIUM 7.8* 8.1*  ALBUMIN 2.9* 2.8*   No results found for this basename: PTH,  in the last 72 hours Iron Studies: No results found for this basename: IRON, TIBC, TRANSFERRIN, FERRITIN,  in the last 72 hours  Studies/Results: No results found.  EXAM:  General appearance: Alert, frail, in no apparent distress  Resp: Mild expiratory wheezes bilaterally  Cardio: RRR without murmur or rub  GI: + BS, soft and nontender  Extremities: Trace edema on L, none on R  Access: AVF @ LUA with + bruit   Dialysis Prescription [place] MWF  3.5 hours (never dialyzes more than 2.5 hours)   44.5 kg 180 dialyzer BFR 350 2K2.25 Ca UF profile 2 AVF left  Venofer 50 QWed, no Aranesp or vitamin D  Assessment/Plan: 1. Cough/Dyspnea - sec to CHF & COPD, on steroids, Doxycycline, wt 46.2 kg s/p net UF 2.1 L yesterday, improved with HD, but not likely to lower EDW, on 2 liters per [place]. 2. ESRD - HD on MWF @ [facility] via AVF, missed Monday, Tues & Thurs this wk, outpatient time 2.5 hrs, K down from 7 to 5.4.  Next HD tomorrow, off  schedule. 3. Dialysis access - prolonged bleeding prior to last fistulogram 11/2013 (per center), AFs stable. 4. 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 5. Anemia - Hgb 11.7, no Aranesp or Fe. 6. Sec HPT - Ca 8.1 (9.1 corrected), P 5.4; no Hectorol, Sensipar 60 mg qd, no binders. 7. Nutrition - Alb 2.8, renal diet, Megace.     LOS: 3 days   LYLES,CHARLES 03/18/2014,7:09 AM  Patient seen and examined, agree with above note with above modifications. Is doing better- almost ready for discharge plan is to keep overnight, do first shift in AM then discharge to home Annie SableKellie Rosezetta Balderston, MD 03/18/2014

## 2014-03-18 NOTE — Progress Notes (Signed)
Physical Therapy Treatment Patient Details Name: [name] MRN: [id number] DOB: 21-May-1933 Today's Date: 03/18/2014    History of Present Illness [name] is a 80 y.o. female with ESRD, COPD, ICM EF 15-20%, missed her dialysis session yesterday due to being SOB with wheezing.  Breathing treatment at home provided no relief.  Unfortunatly today her SOB became worse with wet sounding breath sounds and cough.  As a result she is brought in to ED by family    PT Comments    Pt. Progressing with her mobility and should be ready for DC home following HD tomorrow, from PT perspective  Follow Up Recommendations  Home health PT;Supervision for mobility/OOB     Equipment Recommendations  None recommended by PT    Recommendations for Other Services       Precautions / Restrictions Precautions Precautions: Fall Restrictions Weight Bearing Restrictions: No    Mobility  Bed Mobility Overal bed mobility: Modified Independent Bed Mobility: Supine to Sit     Supine to sit: Modified independent (Device/Increase time)     General bed mobility comments: pt. manged on her own today with use of bedrail minimally  Transfers Overall transfer level: Needs assistance Equipment used: Rolling walker (2 wheeled) Transfers: Sit to/from Stand Sit to Stand: Min guard         General transfer comment: min guard assist for safety  Ambulation/Gait Ambulation/Gait assistance: Min guard Ambulation Distance (Feet): 100 Feet Assistive device: Rolling walker (2 wheeled) Gait Pattern/deviations: Step-through pattern;Decreased step length - right;Decreased step length - left Gait velocity: slow   General Gait Details: gait more stable with use of RW, no overt LOB noted   Stairs            Wheelchair Mobility    Modified Rankin (Stroke Patients Only)       Balance                                    Cognition Arousal/Alertness: Awake/alert Behavior During  Therapy: WFL for tasks assessed/performed Overall Cognitive Status: Within Functional Limits for tasks assessed                      Exercises      General Comments        Pertinent Vitals/Pain Pain Assessment: No/denies pain O2 sats mid 90s throughout session    Home Living                      Prior Function            PT Goals (current goals can now be found in the care plan section) Acute Rehab PT Goals Time For Goal Achievement: 03/31/2014 (entered as 03/17/14 inadveertently on eval same date) Progress towards PT goals: Progressing toward goals    Frequency  Min 3X/week    PT Plan Current plan remains appropriate    Co-evaluation             End of Session Equipment Utilized During Treatment: Gait belt Activity Tolerance: Patient tolerated treatment well Patient left: in bed;with call bell/phone within reach;with family/visitor present     Time: 1049-1105 PT Time Calculation (min): 16 min  Charges:  $Gait Training: 23-37 mins                    G Codes:      Weldon PickingBlankenship, Arlan Birks  B 03/18/2014, 12:29 PM Weldon PickingSusan Tajuana Kniskern PT Acute Rehab Services [phone] Beeper [phone]

## 2014-03-18 NOTE — Progress Notes (Signed)
PROGRESS NOTE  [name] ZOX:[id number] DOB: 1932-09-02 DOA: 03/15/2014 PCP: Galvin ProfferHAGUE, IMRAN P, MD  [name] is a 80 y.o. female with ESRD, COPD, ICM EF 15-20%, missed her dialysis session yesterday due to being SOB with wheezing. Breathing treatment at home provided no relief. Unfortunatly today her SOB became worse with wet sounding breath sounds and cough. As a result she is brought in to ED by family  Assessment/Plan: COPD exacerbation  Change pred to po. Schedule nebs. Add mucinex.  Non-complaince with renal dialysis leading to exacerbation of CHF (baseline EF of only 15-20%)  hyperkalemia of 7.0  S/p Dialysis with improvement of K- did not finish course on 10/15  Acute on chronic respiratory failure  PT eval  Code Status: full Family Communication: patient Disposition Plan:    Consultants:  renal  Procedures:      HPI/Subjective: Still dyspneic. Coughing wheezing, but wants to go home   Objective: Filed Vitals:   03/18/14 2030  BP: 133/56  Pulse: 76  Temp: 98.1 F (36.7 C)  Resp: 18    Intake/Output Summary (Last 24 hours) at 03/18/14 2120 Last data filed at 03/18/14 1812  Gross per 24 hour  Intake    580 ml  Output      0 ml  Net    580 ml   Filed Weights   03/17/14 0654 03/17/14 2100 03/18/14 2030  Weight: 48.4 kg (106 lb 11.2 oz) 46.2 kg (101 lb 13.6 oz) 47.8 kg (105 lb 6.1 oz)    Exam:   General:  A+Ox3, NAD  Cardiovascular: rrr  Respiratory: diffuse wheezing  Abdomen: +BS, soft  Musculoskeletal: no edema   Data Reviewed: Basic Metabolic Panel:  Recent Labs Lab 03/15/14 1655 03/16/14 0423 03/17/14 0711 03/18/14 0500  NA 135* 138 135* 139  K 7.0* 5.4* 6.5* 4.9  CL 91* 96 95* 97  CO2 25 27 24 29   GLUCOSE 100* 83 113* 119*  BUN 57* 29* 53* 45*  CREATININE 7.68* 5.08* 6.36* 4.80*  CALCIUM 8.4 8.7 7.8* 8.1*  PHOS  --  6.0* 7.3* 5.4*   Liver Function Tests:  Recent Labs Lab 03/15/14 1655 03/16/14 0423  03/17/14 0711 03/18/14 0500  AST 33  --   --   --   ALT 21  --   --   --   ALKPHOS 165*  --   --   --   BILITOT 0.3  --   --   --   PROT 7.2  --   --   --   ALBUMIN 3.3* 3.0* 2.9* 2.8*   No results found for this basename: LIPASE, AMYLASE,  in the last 168 hours No results found for this basename: AMMONIA,  in the last 168 hours CBC:  Recent Labs Lab 03/15/14 1655 03/16/14 0423 03/17/14 0711  WBC 6.5 5.4 7.5  NEUTROABS 5.3  --   --   HGB 12.7 12.3 11.7*  HCT 40.9 39.7 38.2  MCV 109.4* 109.1* 108.8*  PLT 126* 115* 112*   Cardiac Enzymes: No results found for this basename: CKTOTAL, CKMB, CKMBINDEX, TROPONINI,  in the last 168 hours BNP (last 3 results) No results found for this basename: PROBNP,  in the last 8760 hours CBG: No results found for this basename: GLUCAP,  in the last 168 hours  No results found for this or any previous visit (from the past 240 hour(s)).   Studies: No results found.  Scheduled Meds: . antiseptic oral rinse  7 mL  Mouth Rinse BID  . aspirin EC  81 mg Oral Daily  . atorvastatin  80 mg Oral Daily  . calcium acetate  1,334 mg Oral TID WC  . cinacalcet  60 mg Oral Q breakfast  . docusate sodium  100 mg Oral Daily  . doxycycline  100 mg Oral Q12H  . feeding supplement (PRO-STAT SUGAR FREE 64)  30 mL Oral Q1500  . feeding supplement (RESOURCE BREEZE)  1 Container Oral BID BM  . folic acid  400 mcg Oral Daily  . guaiFENesin  600 mg Oral BID  . heparin  5,000 Units Subcutaneous 3 times per day  . hydrALAZINE  25 mg Oral TID  . hydrocortisone   Rectal BID  . Influenza vac split quadrivalent PF  0.5 mL Intramuscular Tomorrow-1000  . ipratropium-albuterol  3 mL Nebulization QID  . levothyroxine  150 mcg Oral QAC breakfast  . megestrol  600 mg Oral Daily  . olopatadine  1 drop Both Eyes BID  . polycarbophil  625 mg Oral Daily  . predniSONE  40 mg Oral BID  . sodium chloride  3 mL Intravenous Q12H   Continuous Infusions:  Antibiotics Given  (last 72 hours)   Date/Time Action Medication Dose   03/16/14 1031 Given   doxycycline (VIBRA-TABS) tablet 100 mg 100 mg   03/16/14 2150 Given   doxycycline (VIBRA-TABS) tablet 100 mg 100 mg   03/17/14 1109 Given   doxycycline (VIBRA-TABS) tablet 100 mg 100 mg   03/17/14 2136 Given   doxycycline (VIBRA-TABS) tablet 100 mg 100 mg   03/18/14 1011 Given   doxycycline (VIBRA-TABS) tablet 100 mg 100 mg      Time spent: 25 min  Leelan Rajewski L  [facility] Pager [phone]. If 7PM-7AM, please contact night-coverage at [email or web address], password [email or web address] 03/18/2014, 9:20 PM  LOS: 3 days

## 2014-03-19 LAB — CBC
HCT: 38.1 % (ref 36.0–46.0)
HEMOGLOBIN: 11.9 g/dL — AB (ref 12.0–15.0)
MCH: 32.5 pg (ref 26.0–34.0)
MCHC: 31.2 g/dL (ref 30.0–36.0)
MCV: 104.1 fL — ABNORMAL HIGH (ref 78.0–100.0)
Platelets: 113 10*3/uL — ABNORMAL LOW (ref 150–400)
RBC: 3.66 MIL/uL — ABNORMAL LOW (ref 3.87–5.11)
RDW: 16.8 % — ABNORMAL HIGH (ref 11.5–15.5)
WBC: 5.3 10*3/uL (ref 4.0–10.5)

## 2014-03-19 LAB — RENAL FUNCTION PANEL
ALBUMIN: 2.8 g/dL — AB (ref 3.5–5.2)
ANION GAP: 16 — AB (ref 5–15)
BUN: 78 mg/dL — ABNORMAL HIGH (ref 6–23)
CO2: 24 mEq/L (ref 19–32)
Calcium: 8.2 mg/dL — ABNORMAL LOW (ref 8.4–10.5)
Chloride: 94 mEq/L — ABNORMAL LOW (ref 96–112)
Creatinine, Ser: 5.86 mg/dL — ABNORMAL HIGH (ref 0.50–1.10)
GFR calc Af Amer: 7 mL/min — ABNORMAL LOW (ref >90)
GFR calc non Af Amer: 6 mL/min — ABNORMAL LOW (ref >90)
GLUCOSE: 129 mg/dL — AB (ref 70–99)
POTASSIUM: 5.5 meq/L — AB (ref 3.7–5.3)
Phosphorus: 4.3 mg/dL (ref 2.3–4.6)
SODIUM: 134 meq/L — AB (ref 137–147)

## 2014-03-19 MED ORDER — PENTAFLUOROPROP-TETRAFLUOROETH EX AERO: 1.0000 "application " | INHALATION_SPRAY | CUTANEOUS | Status: DC | PRN

## 2014-03-19 MED ORDER — LIDOCAINE HCL (PF) 1 % IJ SOLN: 5.0000 mL | INTRAMUSCULAR | Status: DC | PRN

## 2014-03-19 MED ORDER — ALTEPLASE 2 MG IJ SOLR
2.0000 mg | Freq: Once | INTRAMUSCULAR | Status: DC | PRN
  Filled 2014-03-19: qty 2

## 2014-03-19 MED ORDER — PREDNISONE 10 MG PO TABS: ORAL_TABLET | ORAL | Status: AC

## 2014-03-19 MED ORDER — SODIUM CHLORIDE 0.9 % IV SOLN: 100.0000 mL | INTRAVENOUS | Status: DC | PRN

## 2014-03-19 MED ORDER — LIDOCAINE-PRILOCAINE 2.5-2.5 % EX CREA: 1.0000 "application " | TOPICAL_CREAM | CUTANEOUS | Status: DC | PRN

## 2014-03-19 MED ORDER — HEPARIN SODIUM (PORCINE) 1000 UNIT/ML DIALYSIS
1000.0000 [IU] | INTRAMUSCULAR | Status: DC | PRN
  Filled 2014-03-19: qty 1

## 2014-03-19 MED ORDER — NEPRO/CARBSTEADY PO LIQD: 237.0000 mL | ORAL | Status: DC | PRN

## 2014-03-19 MED ORDER — DOXYCYCLINE HYCLATE 100 MG PO TABS: 100.0000 mg | ORAL_TABLET | Freq: Two times a day (BID) | ORAL | Status: AC

## 2014-03-19 NOTE — Progress Notes (Signed)
Patient Discharge:  Disposition: Home   Education: Eduacted on diagnosis, medications, follow up appointments, and all discharge instructions.  IV: Removed  Telemetry: Discontinued  Follow-up appointments: Pt educated on follow up appointment  Prescriptions: Scripts given to pt  Transportation: Car via daughter  Belongings:all belongings taken with pt.

## 2014-03-19 NOTE — Procedures (Signed)
Patient was seen on dialysis and the procedure was supervised.  BFR 350  Via AVF BP is  146/71.   Patient appears to be tolerating treatment well- signing off early  [name] A 03/19/2014

## 2014-03-19 NOTE — Progress Notes (Signed)
Subjective:  Seen on dialysis, insists on 3 hrs of treatment, then likely discharge, no complaints- only made it 2 hours and 15 min  Objective: Vital signs in last 24 hours: Temp:  [97.9 F (36.6 C)-98.8 F (37.1 C)] 98.8 F (37.1 C) (10/17 0707) Pulse Rate:  [75-80] 75 (10/17 0730) Resp:  [16-18] 16 (10/17 0730) BP: (133-147)/(56-78) 146/72 mmHg (10/17 0730) SpO2:  [93 %-100 %] 100 % (10/17 0707) Weight:  [47.8 kg (105 lb 6.1 oz)-47.9 kg (105 lb 9.6 oz)] 47.9 kg (105 lb 9.6 oz) (10/17 0707) Weight change: 1.6 kg (3 lb 8.4 oz)  Intake/Output from previous day: 10/16 0701 - 10/17 0700 In: 700 [P.O.:700] Out: -  Intake/Output this shift:   Lab Results:  Recent Labs  03/17/14 0711  WBC 7.5  HGB 11.7*  HCT 38.2  PLT 112*   BMET:  Recent Labs  03/17/14 0711 03/18/14 0500  NA 135* 139  K 6.5* 4.9  CL 95* 97  CO2 24 29  GLUCOSE 113* 119*  BUN 53* 45*  CREATININE 6.36* 4.80*  CALCIUM 7.8* 8.1*  ALBUMIN 2.9* 2.8*   No results found for this basename: PTH,  in the last 72 hours Iron Studies: No results found for this basename: IRON, TIBC, TRANSFERRIN, FERRITIN,  in the last 72 hours  Studies/Results: No results found.  EXAM:  General appearance: Alert, frail, in no apparent distress  Resp: Scattered expiratory wheezes bilaterally  Cardio: RRR without murmur or rub  GI: + BS, soft and nontender  Extremities: Trace edema on L, none on R  Access: AVF @ LUA with BFR 350 cc/min  Dialysis Prescription [place] MWF  3.5 hours (never dialyzes more than 2.5 hours) 44.5 kg  180 dialyzer BFR 350 2K2.25 Ca UF profile 2 AVF left  Venofer 50 QWed, no Aranesp or vitamin D  Assessment/Plan: 1. Cough/Dyspnea - sec to CHF & COPD, on steroids, Doxycycline, wt 47.9 kg, improved with HD, on 2 liters per [place]. Think EDW is right just doesn't seem to get there 2. ESRD - HD on MWF @ [facility] via AVF, off schedule.  Resume schedule on Monday at OP unit 3. Dialysis access - prolonged  bleeding prior to last fistulogram 11/2013 (per center), AFs stable.  4. 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  5. Anemia - Hgb 11.7, no Aranesp or Fe.  6. Sec HPT - Ca 8.1 (9.1 corrected), P 5.4; no Hectorol, Sensipar 60 mg qd, no binders.  7. Nutrition - Alb 2.8, renal diet, Megace.     LOS: 4 days   LYLES,CHARLES 03/19/2014,7:31 AM  Patient seen and examined, agree with above note with above modifications. I see plans are for discharge home today- pt stubborn- signing off after 2 hours and 15 min- I anticipate will not be able to change behavior- hopefully will not lead to more hosps  Annie SableKellie Hilberto Burzynski, MD 03/19/2014

## 2014-03-19 NOTE — Discharge Summary (Signed)
Physician Discharge Summary  Jacalyn LefevreVera M Gros [id number] DOB: 01-12-1933 DOA: 03/15/2014  PCP: Galvin ProfferHAGUE, IMRAN P, MD  Admit date: 03/15/2014 Discharge date: 03/19/2014  Time spent: greater than 30 minutes  Discharge Diagnoses:  Principal Problem:   Noncompliance with renal dialysis Active Problems:   ESRD (end stage renal disease) on dialysis   Pulmonary edema   Patient nonadherence   Hyperkalemia   Acute on chronic systolic CHF (congestive heart failure)   Chronic systolic CHF (congestive heart failure)   COPD exacerbation   Discharge Condition: stable  Filed Weights   03/18/14 2030 03/19/14 0707 03/19/14 1026  Weight: 47.8 kg (105 lb 6.1 oz) 47.9 kg (105 lb 9.6 oz) 46.2 kg (101 lb 13.6 oz)    History of present illness:  80 y.o. female with ESRD, COPD, ICM EF 15-20%, missed her dialysis session yesterday due to being SOB with wheezing. Breathing treatment at home provided no relief. Unfortunatly today her SOB became worse with wet sounding breath sounds and cough. As a result she is brought in to ED by family. Found to have potassium of 7, acute on chronic respiratory failure secondary to copd exacerbation and fluid overload from missing dialysis and resulting acute on chronic systolic heart failure  Hospital Course:  COPD exacerbation  Treated wit nebulizers, steroids, doxycycline, oxygen. On 2L O2 at home PRN   Non-complaince with renal dialysis leading to exacerbation of CHF (baseline EF of only 15-20%)  hyperkalemia of 7.0  Corrected with dialysis  Acute on chronic respiratory failure  By discharge, back down to 2 liters [place]  Procedures:  hemodialysis  Consultations:  nephrology  Discharge Exam: Filed Vitals:   03/19/14 1047  BP: 148/60  Pulse: 74  Temp: 97.6 F (36.4 C)  Resp: 19    General: alert and oriented Cardiovascular: RRR Respiratory: CTA Abd: s, nt, nd Ext no CCE   Discharge Instructions   Discharge instructions    Complete by:  As  directed   Renal diet     Increase activity slowly    Complete by:  As directed           Current Discharge Medication List    START taking these medications   Details  doxycycline (VIBRA-TABS) 100 MG tablet Take 1 tablet (100 mg total) by mouth every 12 (twelve) hours. Qty: 5 tablet, Refills: 0    predniSONE (DELTASONE) 10 MG tablet 4 tablets daily for 3 days, then decrease by 1 tablet every 3 days until off Qty: 30 tablet, Refills: 0      CONTINUE these medications which have NOT CHANGED   Details  acetaminophen (TYLENOL) 325 MG tablet Take 2 tablets (650 mg total) by mouth every 6 (six) hours as needed for mild pain, fever or headache.    ALPRAZolam (XANAX) 0.5 MG tablet Take 0.5 mg by mouth 3 (three) times daily as needed for anxiety.    aspirin EC 81 MG tablet Take 81 mg by mouth daily.    atorvastatin (LIPITOR) 80 MG tablet Take 80 mg by mouth daily.    cinacalcet (SENSIPAR) 60 MG tablet Take 60 mg by mouth daily.    dextromethorphan (DELSYM) 30 MG/5ML liquid Take 30 mg by mouth every 12 (twelve) hours as needed for cough.     docusate sodium (COLACE) 100 MG capsule Take 100 mg by mouth daily.     Fiber, Guar Gum, CHEW Chew 1 each by mouth daily.    folic acid (FOLVITE) 400 MCG tablet Take 400 mcg by  mouth daily.    furosemide (LASIX) 80 MG tablet Take 80 mg by mouth 2 (two) times daily.     hydrALAZINE (APRESOLINE) 25 MG tablet Take 25 mg by mouth 3 (three) times daily.    ipratropium (ATROVENT) 0.02 % nebulizer solution Take 0.5 mg by nebulization 2 (two) times daily as needed for wheezing or shortness of breath.    levothyroxine (SYNTHROID, LEVOTHROID) 150 MCG tablet Take 150 mcg by mouth daily before breakfast.    megestrol (MEGACE) 40 MG/ML suspension Take 600 mg by mouth daily.     nitroGLYCERIN (NITROSTAT) 0.4 MG SL tablet Place 0.4 mg under the tongue every 5 (five) minutes as needed for chest pain.     olopatadine (PATANOL) 0.1 % ophthalmic solution  Place 1 drop into both eyes 2 (two) times daily. Affected eye    oxyCODONE-acetaminophen (PERCOCET) 7.5-325 MG per tablet Take 1 tablet by mouth every 8 (eight) hours as needed for pain.     pramoxine-mineral oil-zinc (TUCKS) 1-12.5 % rectal ointment Place rectally 2 (two) times daily as needed for itching or hemorrhoids. Qty: 30 g, Refills: 0       Allergies  Allergen Reactions  . Ivp Dye [Iodinated Diagnostic Agents] Swelling  . Lisinopril Swelling  . Iohexol Itching and Swelling        Follow-up Information   Follow up with HAGUE, Myrene GalasIMRAN P, MD In 2 weeks.   Specialty:  Internal Medicine   Contact information:   [address] [place] [address] [phone]        The results of significant diagnostics from this hospitalization (including imaging, microbiology, ancillary and laboratory) are listed below for reference.    Significant Diagnostic Studies: Dg Chest 2 View  03/15/2014   CLINICAL DATA:  COUGH ABDOMINAL PAIN cough.  Shortness of breath.  EXAM: CHEST  2 VIEW  COMPARISON:  CT abdomen 01/31/2014. Multiple chest radiographs dating back to 06/07/2013.  FINDINGS: Cardiomegaly. Median sternotomy/ CABG. Pulmonary vascular congestion is present. Diffuse interstitial prominence likely rib relates to chronic congestive heart failure. Bilateral pleural apical thickening. Thoracic compression fractures present. Coronary artery stents. Two lead RIGHT subclavian cardiac pacemaker leads are unchanged. Calcified av fistula is present in the LEFT arm.  IMPRESSION: Chronic cardiomegaly and pulmonary vascular congestion. No interval change or acute abnormality.   Electronically Signed   By: Andreas NewportGeoffrey  Lamke M.D.   On: 03/15/2014 18:47    Microbiology: No results found for this or any previous visit (from the past 240 hour(s)).   Labs: Basic Metabolic Panel:  Recent Labs Lab 03/15/14 1655 03/16/14 0423 03/17/14 0711 03/18/14 0500 03/19/14 0639  NA 135* 138 135* 139  134*  K 7.0* 5.4* 6.5* 4.9 5.5*  CL 91* 96 95* 97 94*  CO2 25 27 24 29 24   GLUCOSE 100* 83 113* 119* 129*  BUN 57* 29* 53* 45* 78*  CREATININE 7.68* 5.08* 6.36* 4.80* 5.86*  CALCIUM 8.4 8.7 7.8* 8.1* 8.2*  PHOS  --  6.0* 7.3* 5.4* 4.3   Liver Function Tests:  Recent Labs Lab 03/15/14 1655 03/16/14 0423 03/17/14 0711 03/18/14 0500 03/19/14 0639  AST 33  --   --   --   --   ALT 21  --   --   --   --   ALKPHOS 165*  --   --   --   --   BILITOT 0.3  --   --   --   --   PROT 7.2  --   --   --   --  ALBUMIN 3.3* 3.0* 2.9* 2.8* 2.8*   No results found for this basename: LIPASE, AMYLASE,  in the last 168 hours No results found for this basename: AMMONIA,  in the last 168 hours CBC:  Recent Labs Lab 03/15/14 1655 03/16/14 0423 03/17/14 0711 03/19/14 0639  WBC 6.5 5.4 7.5 5.3  NEUTROABS 5.3  --   --   --   HGB 12.7 12.3 11.7* 11.9*  HCT 40.9 39.7 38.2 38.1  MCV 109.4* 109.1* 108.8* 104.1*  PLT 126* 115* 112* 113*   Cardiac Enzymes: No results found for this basename: CKTOTAL, CKMB, CKMBINDEX, TROPONINI,  in the last 168 hours BNP: BNP (last 3 results) No results found for this basename: PROBNP,  in the last 8760 hours CBG: No results found for this basename: GLUCAP,  in the last 168 hours     Signed:  Maisha Bogen L  [facility] 03/19/2014, 11:45 AM

## 2014-03-22 ENCOUNTER — Inpatient Hospital Stay (HOSPITAL_COMMUNITY)
Admission: EM | Admit: 2014-03-22 | Discharge: 2014-05-03 | DRG: 853 | Disposition: E | Payer: Medicare Other | Attending: Internal Medicine | Admitting: Internal Medicine

## 2014-03-22 ENCOUNTER — Encounter (HOSPITAL_COMMUNITY): Payer: Self-pay | Admitting: Emergency Medicine

## 2014-03-22 ENCOUNTER — Encounter (HOSPITAL_COMMUNITY): Admission: EM | Disposition: E | Payer: Self-pay | Source: Home / Self Care | Attending: Internal Medicine

## 2014-03-22 DIAGNOSIS — E038 Other specified hypothyroidism: Secondary | ICD-10-CM

## 2014-03-22 DIAGNOSIS — Z950 Presence of cardiac pacemaker: Secondary | ICD-10-CM

## 2014-03-22 DIAGNOSIS — Y95 Nosocomial condition: Secondary | ICD-10-CM | POA: Diagnosis present

## 2014-03-22 DIAGNOSIS — I120 Hypertensive chronic kidney disease with stage 5 chronic kidney disease or end stage renal disease: Secondary | ICD-10-CM | POA: Diagnosis present

## 2014-03-22 DIAGNOSIS — N186 End stage renal disease: Secondary | ICD-10-CM

## 2014-03-22 DIAGNOSIS — K668 Other specified disorders of peritoneum: Secondary | ICD-10-CM | POA: Diagnosis present

## 2014-03-22 DIAGNOSIS — Z66 Do not resuscitate: Secondary | ICD-10-CM | POA: Diagnosis not present

## 2014-03-22 DIAGNOSIS — K659 Peritonitis, unspecified: Secondary | ICD-10-CM

## 2014-03-22 DIAGNOSIS — E876 Hypokalemia: Secondary | ICD-10-CM | POA: Diagnosis not present

## 2014-03-22 DIAGNOSIS — N179 Acute kidney failure, unspecified: Secondary | ICD-10-CM | POA: Diagnosis not present

## 2014-03-22 DIAGNOSIS — F17210 Nicotine dependence, cigarettes, uncomplicated: Secondary | ICD-10-CM | POA: Diagnosis present

## 2014-03-22 DIAGNOSIS — J42 Unspecified chronic bronchitis: Secondary | ICD-10-CM

## 2014-03-22 DIAGNOSIS — N2581 Secondary hyperparathyroidism of renal origin: Secondary | ICD-10-CM | POA: Diagnosis present

## 2014-03-22 DIAGNOSIS — Z452 Encounter for adjustment and management of vascular access device: Secondary | ICD-10-CM

## 2014-03-22 DIAGNOSIS — R54 Age-related physical debility: Secondary | ICD-10-CM | POA: Diagnosis present

## 2014-03-22 DIAGNOSIS — Z7982 Long term (current) use of aspirin: Secondary | ICD-10-CM | POA: Diagnosis not present

## 2014-03-22 DIAGNOSIS — K5900 Constipation, unspecified: Secondary | ICD-10-CM | POA: Diagnosis present

## 2014-03-22 DIAGNOSIS — R109 Unspecified abdominal pain: Secondary | ICD-10-CM

## 2014-03-22 DIAGNOSIS — R739 Hyperglycemia, unspecified: Secondary | ICD-10-CM | POA: Diagnosis present

## 2014-03-22 DIAGNOSIS — Z8673 Personal history of transient ischemic attack (TIA), and cerebral infarction without residual deficits: Secondary | ICD-10-CM | POA: Diagnosis not present

## 2014-03-22 DIAGNOSIS — J440 Chronic obstructive pulmonary disease with acute lower respiratory infection: Secondary | ICD-10-CM | POA: Diagnosis present

## 2014-03-22 DIAGNOSIS — Y92230 Patient room in hospital as the place of occurrence of the external cause: Secondary | ICD-10-CM | POA: Diagnosis not present

## 2014-03-22 DIAGNOSIS — G8929 Other chronic pain: Secondary | ICD-10-CM | POA: Diagnosis present

## 2014-03-22 DIAGNOSIS — G8918 Other acute postprocedural pain: Secondary | ICD-10-CM

## 2014-03-22 DIAGNOSIS — Z9889 Other specified postprocedural states: Secondary | ICD-10-CM

## 2014-03-22 DIAGNOSIS — J95822 Acute and chronic postprocedural respiratory failure: Secondary | ICD-10-CM | POA: Diagnosis not present

## 2014-03-22 DIAGNOSIS — Z79818 Long term (current) use of other agents affecting estrogen receptors and estrogen levels: Secondary | ICD-10-CM | POA: Diagnosis not present

## 2014-03-22 DIAGNOSIS — E875 Hyperkalemia: Secondary | ICD-10-CM

## 2014-03-22 DIAGNOSIS — J449 Chronic obstructive pulmonary disease, unspecified: Secondary | ICD-10-CM | POA: Diagnosis present

## 2014-03-22 DIAGNOSIS — Z992 Dependence on renal dialysis: Secondary | ICD-10-CM

## 2014-03-22 DIAGNOSIS — I4891 Unspecified atrial fibrillation: Secondary | ICD-10-CM | POA: Diagnosis present

## 2014-03-22 DIAGNOSIS — J432 Centrilobular emphysema: Secondary | ICD-10-CM

## 2014-03-22 DIAGNOSIS — K559 Vascular disorder of intestine, unspecified: Secondary | ICD-10-CM | POA: Diagnosis present

## 2014-03-22 DIAGNOSIS — D631 Anemia in chronic kidney disease: Secondary | ICD-10-CM | POA: Diagnosis present

## 2014-03-22 DIAGNOSIS — Z888 Allergy status to other drugs, medicaments and biological substances status: Secondary | ICD-10-CM | POA: Diagnosis not present

## 2014-03-22 DIAGNOSIS — R6521 Severe sepsis with septic shock: Secondary | ICD-10-CM | POA: Diagnosis present

## 2014-03-22 DIAGNOSIS — J9601 Acute respiratory failure with hypoxia: Secondary | ICD-10-CM

## 2014-03-22 DIAGNOSIS — Z9119 Patient's noncompliance with other medical treatment and regimen: Secondary | ICD-10-CM | POA: Diagnosis present

## 2014-03-22 DIAGNOSIS — E039 Hypothyroidism, unspecified: Secondary | ICD-10-CM | POA: Diagnosis present

## 2014-03-22 DIAGNOSIS — J189 Pneumonia, unspecified organism: Secondary | ICD-10-CM | POA: Diagnosis present

## 2014-03-22 DIAGNOSIS — F419 Anxiety disorder, unspecified: Secondary | ICD-10-CM | POA: Diagnosis present

## 2014-03-22 DIAGNOSIS — J9600 Acute respiratory failure, unspecified whether with hypoxia or hypercapnia: Secondary | ICD-10-CM

## 2014-03-22 DIAGNOSIS — Z951 Presence of aortocoronary bypass graft: Secondary | ICD-10-CM | POA: Diagnosis not present

## 2014-03-22 DIAGNOSIS — T8132XA Disruption of internal operation (surgical) wound, not elsewhere classified, initial encounter: Secondary | ICD-10-CM | POA: Diagnosis not present

## 2014-03-22 DIAGNOSIS — I480 Paroxysmal atrial fibrillation: Secondary | ICD-10-CM

## 2014-03-22 DIAGNOSIS — K631 Perforation of intestine (nontraumatic): Secondary | ICD-10-CM | POA: Diagnosis present

## 2014-03-22 DIAGNOSIS — I6523 Occlusion and stenosis of bilateral carotid arteries: Secondary | ICD-10-CM | POA: Diagnosis present

## 2014-03-22 DIAGNOSIS — F329 Major depressive disorder, single episode, unspecified: Secondary | ICD-10-CM | POA: Diagnosis present

## 2014-03-22 DIAGNOSIS — J811 Chronic pulmonary edema: Secondary | ICD-10-CM

## 2014-03-22 DIAGNOSIS — I252 Old myocardial infarction: Secondary | ICD-10-CM | POA: Diagnosis not present

## 2014-03-22 DIAGNOSIS — Z955 Presence of coronary angioplasty implant and graft: Secondary | ICD-10-CM | POA: Diagnosis not present

## 2014-03-22 DIAGNOSIS — I5022 Chronic systolic (congestive) heart failure: Secondary | ICD-10-CM | POA: Diagnosis present

## 2014-03-22 DIAGNOSIS — R532 Functional quadriplegia: Secondary | ICD-10-CM | POA: Diagnosis not present

## 2014-03-22 DIAGNOSIS — Y836 Removal of other organ (partial) (total) as the cause of abnormal reaction of the patient, or of later complication, without mention of misadventure at the time of the procedure: Secondary | ICD-10-CM | POA: Diagnosis not present

## 2014-03-22 DIAGNOSIS — K551 Chronic vascular disorders of intestine: Secondary | ICD-10-CM | POA: Diagnosis present

## 2014-03-22 DIAGNOSIS — Z91041 Radiographic dye allergy status: Secondary | ICD-10-CM | POA: Diagnosis not present

## 2014-03-22 DIAGNOSIS — D696 Thrombocytopenia, unspecified: Secondary | ICD-10-CM | POA: Diagnosis not present

## 2014-03-22 DIAGNOSIS — R1012 Left upper quadrant pain: Secondary | ICD-10-CM

## 2014-03-22 DIAGNOSIS — Z4659 Encounter for fitting and adjustment of other gastrointestinal appliance and device: Secondary | ICD-10-CM

## 2014-03-22 DIAGNOSIS — R630 Anorexia: Secondary | ICD-10-CM

## 2014-03-22 DIAGNOSIS — R570 Cardiogenic shock: Secondary | ICD-10-CM | POA: Diagnosis present

## 2014-03-22 DIAGNOSIS — A419 Sepsis, unspecified organism: Principal | ICD-10-CM

## 2014-03-22 DIAGNOSIS — Z515 Encounter for palliative care: Secondary | ICD-10-CM

## 2014-03-22 DIAGNOSIS — R198 Other specified symptoms and signs involving the digestive system and abdomen: Secondary | ICD-10-CM

## 2014-03-22 DIAGNOSIS — I10 Essential (primary) hypertension: Secondary | ICD-10-CM

## 2014-03-22 DIAGNOSIS — K55059 Acute (reversible) ischemia of intestine, part and extent unspecified: Secondary | ICD-10-CM

## 2014-03-22 DIAGNOSIS — J9621 Acute and chronic respiratory failure with hypoxia: Secondary | ICD-10-CM

## 2014-03-22 DIAGNOSIS — I509 Heart failure, unspecified: Secondary | ICD-10-CM

## 2014-03-22 DIAGNOSIS — I442 Atrioventricular block, complete: Secondary | ICD-10-CM

## 2014-03-22 DIAGNOSIS — R0600 Dyspnea, unspecified: Secondary | ICD-10-CM

## 2014-03-22 DIAGNOSIS — F32A Depression, unspecified: Secondary | ICD-10-CM | POA: Diagnosis present

## 2014-03-22 DIAGNOSIS — E43 Unspecified severe protein-calorie malnutrition: Secondary | ICD-10-CM | POA: Diagnosis present

## 2014-03-22 DIAGNOSIS — R4182 Altered mental status, unspecified: Secondary | ICD-10-CM | POA: Diagnosis present

## 2014-03-22 DIAGNOSIS — I2510 Atherosclerotic heart disease of native coronary artery without angina pectoris: Secondary | ICD-10-CM | POA: Diagnosis present

## 2014-03-22 DIAGNOSIS — E785 Hyperlipidemia, unspecified: Secondary | ICD-10-CM

## 2014-03-22 DIAGNOSIS — I5023 Acute on chronic systolic (congestive) heart failure: Secondary | ICD-10-CM

## 2014-03-22 DIAGNOSIS — Z95810 Presence of automatic (implantable) cardiac defibrillator: Secondary | ICD-10-CM

## 2014-03-22 DIAGNOSIS — Z978 Presence of other specified devices: Secondary | ICD-10-CM

## 2014-03-22 HISTORY — PX: LAPAROTOMY: SHX154

## 2014-03-22 HISTORY — PX: COLOSTOMY REVISION: SHX5232

## 2014-03-22 HISTORY — PX: APPLICATION OF WOUND VAC: SHX5189

## 2014-03-22 LAB — APTT: APTT: 25 s (ref 24–37)

## 2014-03-22 LAB — PROTIME-INR
INR: 1.2 (ref 0.00–1.49)
PROTHROMBIN TIME: 15.3 s — AB (ref 11.6–15.2)

## 2014-03-22 LAB — TSH: TSH: 1.65 u[IU]/mL (ref 0.350–4.500)

## 2014-03-22 LAB — TROPONIN I: Troponin I: <0.3 ng/mL (ref <0.30)

## 2014-03-22 SURGERY — LAPAROTOMY, EXPLORATORY
Anesthesia: General | Site: Abdomen

## 2014-03-22 MED ORDER — FENTANYL CITRATE 0.05 MG/ML IJ SOLN
INTRAMUSCULAR | Status: AC
  Filled 2014-03-22: qty 5

## 2014-03-22 MED ORDER — HYDRALAZINE HCL 20 MG/ML IJ SOLN: 10.0000 mg | INTRAMUSCULAR | Status: DC | PRN

## 2014-03-22 MED ORDER — MORPHINE SULFATE 2 MG/ML IJ SOLN: 2.0000 mg | INTRAMUSCULAR | Status: DC | PRN

## 2014-03-22 MED ORDER — NITROGLYCERIN 0.4 MG SL SUBL
0.4000 mg | SUBLINGUAL_TABLET | SUBLINGUAL | Status: DC | PRN
Start: 2014-03-22 — End: 2014-03-25

## 2014-03-22 MED ORDER — ONDANSETRON HCL 4 MG/2ML IJ SOLN
4.0000 mg | Freq: Once | INTRAMUSCULAR | Status: AC
  Administered 2014-03-22: 4 mg via INTRAVENOUS
  Filled 2014-03-22: qty 2

## 2014-03-22 MED ORDER — PROPOFOL 10 MG/ML IV BOLUS
INTRAVENOUS | Status: AC
  Filled 2014-03-22: qty 20

## 2014-03-22 MED ORDER — CALCIUM CHLORIDE 10 % IV SOLN
INTRAVENOUS | Status: AC
  Filled 2014-03-22: qty 20

## 2014-03-22 MED ORDER — ONDANSETRON HCL 4 MG/2ML IJ SOLN
4.0000 mg | Freq: Three times a day (TID) | INTRAMUSCULAR | Status: DC
  Administered 2014-03-22 – 2014-04-07 (×43): 4 mg via INTRAVENOUS
  Filled 2014-03-22 (×42): qty 2

## 2014-03-22 MED ORDER — PIPERACILLIN-TAZOBACTAM IN DEX 2-0.25 GM/50ML IV SOLN
2.2500 g | Freq: Three times a day (TID) | INTRAVENOUS | Status: DC
  Administered 2014-03-23 (×2): 2.25 g via INTRAVENOUS
  Filled 2014-03-22 (×4): qty 50

## 2014-03-22 MED ORDER — VANCOMYCIN HCL IN DEXTROSE 750-5 MG/150ML-% IV SOLN
750.0000 mg | Freq: Once | INTRAVENOUS | Status: AC
  Administered 2014-03-22: 750 mg via INTRAVENOUS
  Filled 2014-03-22: qty 150

## 2014-03-22 MED ORDER — HYDROMORPHONE HCL 1 MG/ML IJ SOLN
1.0000 mg | Freq: Once | INTRAMUSCULAR | Status: AC
  Administered 2014-03-22: 1 mg via INTRAVENOUS
  Filled 2014-03-22: qty 1

## 2014-03-22 SURGICAL SUPPLY — 53 items
BLADE SURG ROTATE 9660 (MISCELLANEOUS) IMPLANT
CANISTER SUCTION 2500CC (MISCELLANEOUS) ×4 IMPLANT
CANISTER WOUND CARE 500ML ATS (WOUND CARE) ×4 IMPLANT
CHLORAPREP W/TINT 26ML (MISCELLANEOUS) ×4 IMPLANT
COVER MAYO STAND STRL (DRAPES) IMPLANT
COVER SURGICAL LIGHT HANDLE (MISCELLANEOUS) ×4 IMPLANT
DRAPE LAPAROSCOPIC ABDOMINAL (DRAPES) ×4 IMPLANT
DRAPE PROXIMA HALF (DRAPES) ×8 IMPLANT
DRAPE UTILITY 15X26 W/TAPE STR (DRAPE) ×8 IMPLANT
DRAPE WARM FLUID 44X44 (DRAPE) ×4 IMPLANT
DRSG OPSITE POSTOP 4X10 (GAUZE/BANDAGES/DRESSINGS) IMPLANT
DRSG OPSITE POSTOP 4X8 (GAUZE/BANDAGES/DRESSINGS) IMPLANT
DRSG VAC ATS LRG SENSATRAC (GAUZE/BANDAGES/DRESSINGS) ×4 IMPLANT
ELECT BLADE 6.5 EXT (BLADE) IMPLANT
ELECT CAUTERY BLADE 6.4 (BLADE) ×8 IMPLANT
ELECT REM PT RETURN 9FT ADLT (ELECTROSURGICAL) ×4
ELECTRODE REM PT RTRN 9FT ADLT (ELECTROSURGICAL) ×2 IMPLANT
GLOVE BIO SURGEON STRL SZ7.5 (GLOVE) ×4 IMPLANT
GLOVE BIOGEL M STRL SZ7.5 (GLOVE) ×4 IMPLANT
GLOVE BIOGEL PI IND STRL 7.0 (GLOVE) ×2 IMPLANT
GLOVE BIOGEL PI IND STRL 8 (GLOVE) ×4 IMPLANT
GLOVE BIOGEL PI INDICATOR 7.0 (GLOVE) ×2
GLOVE BIOGEL PI INDICATOR 8 (GLOVE) ×4
GLOVE ECLIPSE 7.0 STRL STRAW (GLOVE) ×4 IMPLANT
GOWN STRL REUS W/ TWL LRG LVL3 (GOWN DISPOSABLE) IMPLANT
GOWN STRL REUS W/ TWL XL LVL3 (GOWN DISPOSABLE) ×2 IMPLANT
GOWN STRL REUS W/TWL LRG LVL3 (GOWN DISPOSABLE)
GOWN STRL REUS W/TWL XL LVL3 (GOWN DISPOSABLE) ×2
KIT BASIN OR (CUSTOM PROCEDURE TRAY) ×4 IMPLANT
KIT ROOM TURNOVER OR (KITS) ×4 IMPLANT
LIGASURE IMPACT 36 18CM CVD LR (INSTRUMENTS) ×4 IMPLANT
NS IRRIG 1000ML POUR BTL (IV SOLUTION) ×8 IMPLANT
PACK GENERAL/GYN (CUSTOM PROCEDURE TRAY) ×4 IMPLANT
PAD ARMBOARD 7.5X6 YLW CONV (MISCELLANEOUS) ×4 IMPLANT
PENCIL BUTTON HOLSTER BLD 10FT (ELECTRODE) IMPLANT
SPECIMEN JAR LARGE (MISCELLANEOUS) IMPLANT
SPONGE ABDOMINAL VAC ABTHERA (MISCELLANEOUS) ×4 IMPLANT
SPONGE LAP 18X18 X RAY DECT (DISPOSABLE) ×4 IMPLANT
STAPLER CUT CVD 40MM GREEN (STAPLE) ×8 IMPLANT
STAPLER CUT RELOAD GREEN (STAPLE) ×4 IMPLANT
STAPLER VISISTAT 35W (STAPLE) ×4 IMPLANT
SUCTION POOLE TIP (SUCTIONS) ×4 IMPLANT
SUT PDS AB 1 TP1 96 (SUTURE) IMPLANT
SUT SILK 2 0 SH CR/8 (SUTURE) ×4 IMPLANT
SUT SILK 2 0 TIES 10X30 (SUTURE) ×8 IMPLANT
SUT SILK 3 0 SH CR/8 (SUTURE) ×4 IMPLANT
SUT SILK 3 0 TIES 10X30 (SUTURE) ×4 IMPLANT
SUT VIC AB 3-0 SH 18 (SUTURE) ×4 IMPLANT
TOWEL OR 17X26 10 PK STRL BLUE (TOWEL DISPOSABLE) ×4 IMPLANT
TRAY FOLEY CATH 16FRSI W/METER (SET/KITS/TRAYS/PACK) ×4 IMPLANT
TUBE CONNECTING 12'X1/4 (SUCTIONS) ×1
TUBE CONNECTING 12X1/4 (SUCTIONS) ×3 IMPLANT
YANKAUER SUCT BULB TIP NO VENT (SUCTIONS) IMPLANT

## 2014-03-22 NOTE — ED Notes (Signed)
Admitting MD at bedside.

## 2014-03-22 NOTE — ED Notes (Signed)
[facility] transfer [facility]; Pt found to have perforated bowel; pt has had no BM x 3days; pt has dialysis on M,W,F; [facility]; Pt c/o pain abdomen pain at 10/10; Pt is from from home. Pt wears 2 L/M of O2 at home; pt has fistula on left forearm

## 2014-03-22 NOTE — H&P (Signed)
[facility] History and Physical  Maria LefevreVera M Ord [id number] DOB: 1933-02-25 DOA: 03/18/2014  Referring physician: ED physician PCP: Galvin ProfferHAGUE, IMRAN P, MD  Specialists:   Chief Complaint: abdominal pain  HPI: [name] is a 80 y.o. female with peripheral arterial disease, end-stage renal disease on dialysis, congestive heart failure, COPD, who presents with abdominal pain.   She was recently discharged from this hospital after a four-day stay on October 17 due to acute on chronic systolic heart failure, COPD exacerbation. She has been doing fine after she went home until today. She started having abdominal pain at about 2:00 PM. It is associated with fever, chills, nausea and vomiting. Patient [facility]. She had a CT-abdomen, which showed pneumoperitoneum as well as diffusely thickened small intestine. [facility] and [facility]. Patient has a chronic dry cough, which has not changed significantly. She does not have chest pain. They deny that she has been taking NSAIDs.    In [facility] she was found to have potassium of 5.5,  BUN 78, creatinine 5.86, WBC 5.3. Patient is admitted as an inpatient for further evaluation and treatment. Surgeon was consulted.   Review of Systems: As presented in the history of presenting illness, rest negative.  Where does patient live? She lives at home with the daytime and nighttime caregiver. Can patient participate in ADLs? Yes  Allergy:  Allergies  Allergen Reactions  . Ivp Dye [Iodinated Diagnostic Agents] Swelling  . Lisinopril Swelling  . Iohexol Itching and Swelling         Past Medical History  Diagnosis Date  . Carotid artery occlusion     Bilateral carotid artery disease  . Peripheral artery disease   . CHF (congestive heart failure)     EF 15-20% on TEE 12/15/13  . Hypertension   . Hyperlipidemia   . ESRD on hemodialysis 06/20/11    M, W, F; [address], [place].   Started HD in Sept 2008  . AAA (abdominal aortic aneurysm)   . Stroke 2007  . Duodenal ulcer 2008  . S/P CABG (coronary artery bypass graft)     CABG in 2008, had 3 MI's prior  . Depression   . Pneumonia   . COPD, severe   . Hypothyroidism   . Anemia   . Pacemaker   . Atrial fibrillation   . Complete heart block     Past Surgical History  Procedure Laterality Date  . Thyroid surgery  2004  . Dg av dialysis  shunt access exist*l* or  03/2000    left upper arm  . Vaginal hysterectomy  1976  . Hemorrhoid surgery  1971  . Insert / replace / remove pacemaker  1990's    initial placement  . Insert / replace / remove pacemaker  05/2011    MDT ADDRL1 pacemaker implanted by Dr Dulce SellarMunley  . Incisional hernia repair  10/2010    incarcerated  . Common iliac  06/2009    bilateral kissing stents  . Pseudoaneurysm repair  09/1999    right groin  . Av fistula repair  04/2007    left upper arm  . Cardiac catheterization  02/10, 03/12    Most recent showed 3 vessel CAD with patent grafts: SVG to LAD, SVG to PDA and SVG to OM  . Coronary angioplasty with stent placement  09/1999    "1"  . Coronary artery bypass graft  02/2007    CABG X3  . Rhae [place]  without cardioversion N/A 12/02/2013    Procedure: TRANSESOPHAGEAL ECHOCARDIOGRAM (TEE);  Surgeon: Lewayne Bunting, MD;  Location[facility] ENDOSCOPY;  Service: Cardiovascular;  Laterality: N/A;    Social History:  reports that she has been smoking Cigarettes.  She has a 20 pack-year smoking history. She has never used smokeless tobacco. She reports that she does not drink alcohol or use illicit drugs.  Family History: not asked given patient is in acute distress.   Prior to Admission medications   Medication Sig Start Date End Date Taking? Authorizing Provider  acetaminophen (TYLENOL) 325 MG tablet Take 2 tablets (650 mg total) by mouth every 6 (six) hours as needed for mild pain, fever or headache. 12/03/13  Yes Calvert Cantor, MD  ALPRAZolam Prudy Feeler) 0.5 MG  tablet Take 0.5 mg by mouth 3 (three) times daily as needed for anxiety.   Yes Historical Provider, MD  aspirin EC 81 MG tablet Take 81 mg by mouth daily.   Yes Historical Provider, MD  atorvastatin (LIPITOR) 80 MG tablet Take 80 mg by mouth daily.   Yes Historical Provider, MD  cinacalcet (SENSIPAR) 60 MG tablet Take 60 mg by mouth daily.   Yes Historical Provider, MD  dextromethorphan (DELSYM) 30 MG/5ML liquid Take 30 mg by mouth every 12 (twelve) hours as needed for cough.    Yes Historical Provider, MD  docusate sodium (COLACE) 100 MG capsule Take 100 mg by mouth daily.    Yes Historical Provider, MD  doxycycline (VIBRA-TABS) 100 MG tablet Take 1 tablet (100 mg total) by mouth every 12 (twelve) hours. 03/19/14  Yes Christiane Ha, MD  Fiber, Guar Gum, CHEW Chew 1 each by mouth daily.   Yes Historical Provider, MD  folic acid (FOLVITE) 400 MCG tablet Take 400 mcg by mouth daily.   Yes Historical Provider, MD  furosemide (LASIX) 80 MG tablet Take 80 mg by mouth 2 (two) times daily.    Yes Historical Provider, MD  hydrALAZINE (APRESOLINE) 25 MG tablet Take 25 mg by mouth 3 (three) times daily.   Yes Historical Provider, MD  ipratropium (ATROVENT) 0.02 % nebulizer solution Take 0.5 mg by nebulization 2 (two) times daily as needed for wheezing or shortness of breath.   Yes Historical Provider, MD  levothyroxine (SYNTHROID, LEVOTHROID) 150 MCG tablet Take 150 mcg by mouth daily before breakfast.   Yes Historical Provider, MD  megestrol (MEGACE) 40 MG/ML suspension Take 600 mg by mouth daily.  03/11/14  Yes Historical Provider, MD  olopatadine (PATANOL) 0.1 % ophthalmic solution Place 1 drop into both eyes 2 (two) times daily. Affected eye   Yes Historical Provider, MD  oxyCODONE-acetaminophen (PERCOCET) 7.5-325 MG per tablet Take 1 tablet by mouth every 8 (eight) hours as needed for pain.    Yes Historical Provider, MD  pramoxine-mineral oil-zinc (TUCKS) 1-12.5 % rectal ointment Place rectally 2  (two) times daily as needed for itching or hemorrhoids. 11/16/13  Yes Hollice Espy, MD  predniSONE (DELTASONE) 10 MG tablet 4 tablets daily for 3 days, then decrease by 1 tablet every 3 days until off 03/19/14  Yes Christiane Ha, MD  nitroGLYCERIN (NITROSTAT) 0.4 MG SL tablet Place 0.4 mg under the tongue every 5 (five) minutes as needed for chest pain.     Historical Provider, MD    Physical Exam: Filed Vitals:   03/23/14 0400 03/23/14 0421 03/23/14 0500 03/23/14 0600  BP: 97/52  107/57 85/42  Pulse:      Temp:  96.7 F (35.9 C)  TempSrc:  Rectal    Resp: 17  18 19   Height:      Weight:      SpO2: 100%      General: in acute distress HEENT:       Eyes: PERRL, EOMI, no scleral icterus       ENT: No discharge from the ears and nose, no pharynx injection, no tonsillar enlargement.        Neck: No JVD, no bruit, no mass felt. Cardiac: S1/S2, RRR, No murmurs, gallops or rubs Pulm: diffused rales. Abd: diffused tenderness with rebound pain. BS present Ext: LUE AV fistula + thrill; nonpalp DP  Musculoskeletal: No joint deformities, erythema, or stiffness, ROM full Skin: No rashes.  Neuro: Very lethargic, and oriented X3, cranial nerves II-XII grossly intact, muscle strength 5/5 in all extreme ties.  Psych: Patient is not psychotic, no suicidal or hemocidal ideation.  Labs on Admission:  Basic Metabolic Panel:  Recent Labs Lab 03/17/14 0711 03/18/14 0500 03/19/14 0639 03/23/14 0106 03/23/14 0448 03/23/14 0500  NA 135* 139 134* 139 143  --   K 6.5* 4.9 5.5* 3.9 4.5  --   CL 95* 97 94*  --  104  --   CO2 24 29 24   --  19  --   GLUCOSE 113* 119* 129*  --  127*  --   BUN 53* 45* 78*  --  73*  --   CREATININE 6.36* 4.80* 5.86*  --  5.30*  --   CALCIUM 7.8* 8.1* 8.2*  --  7.3*  --   MG  --   --   --   --  1.5  --   PHOS 7.3* 5.4* 4.3  --   --  4.7*   Liver Function Tests:  Recent Labs Lab 03/17/14 0711 03/18/14 0500 03/19/14 0639 03/23/14 0448  AST  --   --    --  19  ALT  --   --   --  15  ALKPHOS  --   --   --  96  BILITOT  --   --   --  0.5  PROT  --   --   --  4.8*  ALBUMIN 2.9* 2.8* 2.8* 2.6*   No results found for this basename: LIPASE, AMYLASE,  in the last 168 hours No results found for this basename: AMMONIA,  in the last 168 hours CBC:  Recent Labs Lab 03/17/14 0711 03/19/14 0639 03/23/14 0106 03/23/14 0448  WBC 7.5 5.3  --  2.6*  HGB 11.7* 11.9* 13.9 13.4  HCT 38.2 38.1 41.0 43.2  MCV 108.8* 104.1*  --  105.1*  PLT 112* 113*  --  110*   Cardiac Enzymes:  Recent Labs Lab 01-11-2014 2257 03/23/14 0448  TROPONINI <0.30 <0.30    BNP (last 3 results) No results found for this basename: PROBNP,  in the last 8760 hours CBG:  Recent Labs Lab 03/23/14 0219 03/23/14 0409  GLUCAP 138* 128*    Radiological Exams on Admission: No results found.  EKG: Independently reviewed.   Assessment/Plan Principal Problem:   Pneumoperitoneum Active Problems:   Coronary artery disease   Hypertension   Hyperlipidemia   COPD (chronic obstructive pulmonary disease)   Anxiety and depression   ESRD (end stage renal disease) on dialysis   Hypothyroidism   AICD (automatic cardioverter/defibrillator) present   Hyperkalemia   Abdominal pain   Bowel perforation  #  pneumoperitoneum: As evidenced by CT abdomen. It is unclear about the  source of her perforation. Surgeon was consulted. Dr. Andrey Campanile evaluated patient. After extensive discussion with family, Dr. Andrey Campanile decided to take patient to OR. Patient will be transferred to [facility] after surgery is done.  - will start IV antibiotics as suggested by Dr. Andrey Campanile: Vancomycin and zosyn - pain control - check INR/PTT  # For other chronic issues, will hold all oral meds given that patient needs to be on NPO for surgery. Will start prn hydralazine for bp control. Renal was informed for HD. Check trop x 3.   DVT ppx: SQ Heparin  Code Status: Full code Family Communication:  Yes,  patient's family at bed side Disposition Plan: Admit to inpatient   Date of Service 03/23/2014    Lorretta Harp [facility] Pager [phone]  If 7PM-7AM, please contact night-coverage [email or web address] Password [email or web address] 03/23/2014, 7:12 AM

## 2014-03-22 NOTE — ED Provider Notes (Signed)
Spoke with Dr. Jeanmarie [facility]ertMarcus [name] at [facility].  Pt is being transferred for likely perforated gastric ulcer with pneumoperitoneum.  Dr. Andrey CampanileWilson requested ED to ED transfer.  Transferring physician will arrange for transportation.  Maria MoMatthew Gentry, MD 03/21/2014 [phone]

## 2014-03-22 NOTE — ED Notes (Signed)
MD at bedside. 

## 2014-03-22 NOTE — ED Provider Notes (Signed)
I saw and evaluated the patient, reviewed the resident's note and I agree with the findings and plan.  Please see my additional dictation.    Rolland PorterMark Zuha Dejonge, MD 16-Dec-2013 [phone]

## 2014-03-22 NOTE — Progress Notes (Signed)
ANTIBIOTIC CONSULT NOTE - INITIAL  Pharmacy Consult for vancomycin, zosyn Indication: bowel perf  Allergies  Allergen Reactions  . Ivp Dye [Iodinated Diagnostic Agents] Swelling  . Lisinopril Swelling  . Iohexol Itching and Swelling         Patient Measurements: Height: 5\' 3"  (160 cm) Weight: 100 lb (45.36 kg) IBW/kg (Calculated) : 52.4 Adjusted Body Weight:   Vital Signs: Temp: 97.4 F (36.3 C) (10/20 2117) BP: 173/66 mmHg (10/20 2139) Pulse Rate: 84 (10/20 2139) Intake/Output from previous day:   Intake/Output from this shift:    Labs: No results found for this basename: WBC, HGB, PLT, LABCREA, CREATININE,  in the last 72 hours Estimated Creatinine Clearance: 5.6 ml/min (by C-G formula based on Cr of 5.86). No results found for this basename: VANCOTROUGH, VANCOPEAK, VANCORANDOM, GENTTROUGH, GENTPEAK, GENTRANDOM, TOBRATROUGH, TOBRAPEAK, TOBRARND, AMIKACINPEAK, AMIKACINTROU, AMIKACIN,  in the last 72 hours   Microbiology: No results found for this or any previous visit (from the past 720 hour(s)).  Medical History: Past Medical History  Diagnosis Date  . Carotid artery occlusion     Bilateral carotid artery disease  . Peripheral artery disease   . CHF (congestive heart failure)     EF 15-20% on TEE 12/15/13  . Hypertension   . Hyperlipidemia   . ESRD on hemodialysis 06/20/11    M, W, F; [address], [place].  Started HD in Sept 2008  . AAA (abdominal aortic aneurysm)   . Stroke 2007  . Duodenal ulcer 2008  . S/P CABG (coronary artery bypass graft)     CABG in 2008, had 3 MI's prior  . Depression   . Pneumonia   . COPD, severe   . Hypothyroidism   . Anemia   . Pacemaker   . Atrial fibrillation   . Complete heart block     Medications:   (Not in a hospital admission)  Assessment: 80 yo female with perforated bowel.  Pt has PMH of ESRD on HD-MWF, COPD, HF and now presents with bowel perforation.  Pt received Zosyn in the ambulance this evening while  transferring to [facility]. Currently afebrile, without leukocytosis.   Goal of Therapy:  Vancomycin trough level 15-20 mcg/ml  Plan:  -Zosyn 2.25 g IV q8h -Vancomycin 750 mg IV x1 -F/u HD schedule, will need to reorder vanc -Monitor cultures, surgery plans   Agapito GamesAlison Tajana Crotteau, PharmD, BCPS Clinical Pharmacist Pager: [phone] 03/03/2014 10:46 PM

## 2014-03-22 NOTE — ED Notes (Signed)
Per Admitting Pt will remain in ED until family makes decision about surgery; Admitting to notify RN when decision is made; Report called to  3E; RN to notify floor if pt will still be [id number].

## 2014-03-22 NOTE — ED Notes (Signed)
Pt remains monitored by blood pressure, pulse ox, and 5 lead. Pts family remains at bedside.  

## 2014-03-22 NOTE — Consult Note (Signed)
Reason for Consult: perforated viscous Referring Physician: Dr Vick FreesAllen  [name] [name] is an 80 y.o. female.  HPI: 80 year old Caucasian female with peripheral arterial disease, end-stage renal disease on dialysis, congestive heart failure, COPD was taken to [facility] earlier this afternoon with complaints of worsening abdominal pain as well as some confusion. She was recently discharged from this hospital after a four-day stay on October 17. She was admitted to [facility] with acute on chronic systolic heart failure, COPD exacerbation, end-stage renal disease, and questionable compliance. The family states that she has not been feeling well since discharge from the hospital. They state that she was complaining of worsening abdominal pain. They state that she did not localize any particular area. Because her pain was worsening they contacted EMS today he took her to [facility]. At [facility] she underwent a noncontrasted CT scan which demonstrated pneumoperitoneum as well as diffusely thickened small intestine as well as free fluid. She was transferred here for further care and evaluation. She had dialysis yesterday. They state that she also had some constipation and reportedly her last bowel movement was last Friday. They deny that she has had any fevers or chills. She has somewhat of a chronic cough. She continues to smoke a few cigarettes daily. History was obtained primarily from the family. The patient was not able to contribute much to her history. They deny that she has been taking NSAIDs. She lives at home with the daytime and nighttime caregiver. She reportedly was given a mild laxative for constipation. They the she has a history of a bleeding ulcer.  In [facility] she was found to have a potassium of 5-1/2 which is her to her baseline, BUN 79, creatinine 5.7, blood glucose 163, and white blood cell count 7.9, hemoglobin 13.7, hematocrit 42, platelet count 157.  Past Medical History   Diagnosis Date  . Carotid artery occlusion     Bilateral carotid artery disease  . Peripheral artery disease   . CHF (congestive heart failure)     EF 15-20% on TEE 12/15/13  . Hypertension   . Hyperlipidemia   . ESRD on hemodialysis 06/20/11    [name], W, F; [address], [place].  Started HD in Sept 2008  . AAA (abdominal aortic aneurysm)   . Stroke 2007  . Duodenal ulcer 2008  . S/P CABG (coronary artery bypass graft)     CABG in 2008, had 3 MI's prior  . Depression   . Pneumonia   . COPD, severe   . Hypothyroidism   . Anemia   . Pacemaker   . Atrial fibrillation   . Complete heart block     Past Surgical History  Procedure Laterality Date  . Thyroid surgery  2004  . Dg av dialysis  shunt access exist*l* or  03/2000    left upper arm  . Vaginal hysterectomy  1976  . Hemorrhoid surgery  1971  . Insert / replace / remove pacemaker  1990's    initial placement  . Insert / replace / remove pacemaker  05/2011    MDT ADDRL1 pacemaker implanted by Dr Dulce SellarMunley  . Incisional hernia repair  10/2010    incarcerated  . Common iliac  06/2009    bilateral kissing stents  . Pseudoaneurysm repair  09/1999    right groin  . Av fistula repair  04/2007    left upper arm  . Cardiac catheterization  02/10, 03/12    Most recent showed 3 vessel CAD with patent  grafts: SVG to LAD, SVG to PDA and SVG to OM  . Coronary angioplasty with stent placement  09/1999    "1"  . Coronary artery bypass graft  02/2007    CABG X3  . Tee without cardioversion N/A 12/02/2013    Procedure: TRANSESOPHAGEAL ECHOCARDIOGRAM (TEE);  Surgeon: Lewayne Bunting, MD;  Location: [facility] ENDOSCOPY;  Service: Cardiovascular;  Laterality: N/A;    History reviewed. No pertinent family history.  Social History:  reports that she has been smoking Cigarettes.  She has a 20 pack-year smoking history. She has never used smokeless tobacco. She reports that she does not drink alcohol or use illicit drugs.  Allergies:  Allergies    Allergen Reactions  . Ivp Dye [Iodinated Diagnostic Agents] Swelling  . Lisinopril Swelling  . Iohexol Itching and Swelling         Medications: I have reviewed the patient's current medications.  No results found for this or any previous visit (from the past 48 hour(s)).  No results found.  Review of Systems  Unable to perform ROS: acuity of condition  Cardiovascular:       Echo 12/2013 - ef 15-20%; global hypokinesis. Severe LV dysfunction; h/o bacterial endocarditis 12/2013   Blood pressure 173/66, pulse 84, temperature 97.4 F (36.3 C), resp. rate 30, height 5\' 3"  (1.6 [name]), weight 100 lb (45.36 kg), SpO2 97.00%. Physical Exam  Vitals reviewed. Constitutional: She is oriented to person, place, and time. She appears lethargic. She appears cachectic. She is cooperative. She has a sickly appearance. She appears ill. No distress.  HENT:  Head: Normocephalic and atraumatic.  Right Ear: External ear normal.  Left Ear: External ear normal.  Eyes: Conjunctivae are normal. No scleral icterus.  injected  Neck: Normal range of motion. Neck supple. No tracheal deviation present. No thyromegaly present.  Cardiovascular: Normal rate and normal heart sounds.   Pulses:      Carotid pulses are 2+ on the right side, and 2+ on the left side.      Femoral pulses are 2+ on the right side, and 2+ on the left side. LUE AV fistula - +thrill; nonpalp DP  Respiratory: Effort normal. No stridor. No respiratory distress. She has no wheezes.    Coarse BS b/l; some upper airway sounds as well; +cough  GI: Soft. There is tenderness. There is guarding. There is no rebound. A hernia is present. Hernia confirmed positive in the ventral area.  Some distension; diffuse TTP; firm; lower midline hernia vs diastasis  Musculoskeletal: She exhibits no edema and no tenderness.  Lymphadenopathy:    She has no cervical adenopathy.  Neurological: She is oriented to person, place, and time. She appears lethargic.  No cranial nerve deficit or sensory deficit. She exhibits normal muscle tone. GCS eye subscore is 3. GCS verbal subscore is 5. GCS motor subscore is 6.  Oriented to person, year, state  Skin: Skin is warm and dry. Ecchymosis noted. No rash noted. She is not diaphoretic. No erythema. No pallor.  Hyperpigmented LE; no edema; scattered echymosis  Psychiatric: She has a normal mood and affect. Her behavior is normal. Judgment and thought content normal.   RADIOLOGY: RADIOLOGICAL STUDIES: I have personally reviewed the radiological exams myself with the radiologist   Assessment/Plan: Pneumoperitoneum from perforated viscus COPD End-stage renal disease on dialysis Coronary artery disease Hypertension Hyperlipidemia History of medical noncompliance Tobacco use Chronic systolic CHF  Hyperglycemia Hyperkalemia  I reviewed the CT scan which demonstrated free air as well as  severely thickened small bowel walls. It is unclear the source of her perforation whether it it is in the upper abdomen or lower abdomen. She does have a surgical abdomen. I had a prolonged conversation with her children as well as extended family for over 30 minutes regarding surgical as well as medical management options. We discussed observation with antibiotics alone versus surgical exploration. We discussed at length the risk and benefits of surgery including but not limited to bleeding, infection, injury to other structures, hernia formation, potential need for bowel resection, anastomotic leak, need for ostomy,  Intestinal ischemia, hernia formation, blood clot formation, perioperative cardiac and pulmonary complications, death, prolonged hospitalization,. We also discussed no surgical intervention. Using the NSQIP surgical calculator I inputted her medical history and went over her risk score with the family. She is a 66% chance of perioperative death, 63% chance of any complication, 89% chance of discharge to a nursing  facility. The family had a prolonged conversation amongst themselves along with the patient. They have decided to proceed with surgery with the understanding it may not be life saving. All of their questions rest and answered. We'll plan on taking the patient to the operating room for exploratory laparotomy possible bowel resection possible ostomy. I explained to the family that she may not come off the ventilator postoperatively. Discussed with triad as well as CCM - Dr Detereding.   A total 1.5 hrs was spent evaluating pt, reviewing her chart, family counseling, and coordination of care  Mary Sellaric [name]. Andrey CampanileWilson, MD, FACS FASMBS General, Bariatric, & Minimally Invasive Surgery [facility]   [facility] InstituteWILSON,Trinitee Horgan [name] 2014-01-16, 10:32 PM

## 2014-03-22 NOTE — Anesthesia Preprocedure Evaluation (Addendum)
Anesthesia Evaluation  Patient identified by MRN, date of birth, ID band Patient unresponsive    Reviewed: Unable to perform ROS - Chart review only  Airway Mallampati: II TM Distance: >3 FB Neck ROM: Full    Dental  (+) Edentulous Upper, Edentulous Lower   Pulmonary shortness of breath, COPDCurrent Smoker,          Cardiovascular hypertension, + CAD, + CABG, + Peripheral Vascular Disease and +CHF + dysrhythmias + pacemaker + Cardiac Defibrillator     Neuro/Psych CVA    GI/Hepatic PUD,   Endo/Other  Hypothyroidism   Renal/GU Dialysis and ESRFRenal disease     Musculoskeletal   Abdominal   Peds  Hematology  (+) anemia ,   Anesthesia Other Findings Sepsis  Reproductive/Obstetrics                          Anesthesia Physical Anesthesia Plan  ASA: IV and emergent  Anesthesia Plan: General   Post-op Pain Management:    Induction: Intravenous  Airway Management Planned: Oral ETT  Additional Equipment: Arterial line  Intra-op Plan:   Post-operative Plan: Post-operative intubation/ventilation  Informed Consent:   History available from chart only  Plan Discussed with: CRNA, Anesthesiologist and Surgeon  Anesthesia Plan Comments:        Anesthesia Quick Evaluation

## 2014-03-22 NOTE — ED Provider Notes (Signed)
CSN: [id number]     Arrival date & time 04/19/2014  2105 History   First MD Initiated Contact with [name] 04/06/2014 2116     Chief Complaint  [name] presents with  . Constipation     (Consider location/radiation/quality/duration/timing/severity/associated sxs/prior Treatment) HPI Comments: [name] here with known perforated gastric ulcer and pneumoperitoneum. Seen at outside hospital and this was diagnosed via CT. [name] also has history of renal failure as well and was discharged 3 days ago after an admission for missing dialysis. I have spoken with the neurosurgeon, Dr. Andrey Campanile, who comes to the [name]. Due to her medical complexity, he requested the [name] admitted to the hospitalist service and I will contact him for admission  [name] is a 80 y.o. female presenting with constipation. The history is provided by the [name].  Constipation   Past Medical History  Diagnosis Date  . Carotid artery occlusion     Bilateral carotid artery disease  . Peripheral artery disease   . CHF (congestive heart failure)     EF 15-20% on TEE 12/15/13  . Hypertension   . Hyperlipidemia   . ESRD on hemodialysis 06/20/11    M, W, F; [address], [place].  Started HD in Sept 2008  . AAA (abdominal aortic aneurysm)   . Stroke 2007  . Duodenal ulcer 2008  . S/P CABG (coronary artery bypass graft)     CABG in 2008, had 3 MI's prior  . Depression   . Pneumonia   . COPD, severe   . Hypothyroidism   . Anemia   . Pacemaker   . Atrial fibrillation   . Complete heart block    Past Surgical History  Procedure Laterality Date  . Thyroid surgery  2004  . Dg av dialysis  shunt access exist*l* or  03/2000    left upper arm  . Vaginal hysterectomy  1976  . Hemorrhoid surgery  1971  . Insert / replace / remove pacemaker  1990's    initial placement  . Insert / replace / remove pacemaker  05/2011    MDT ADDRL1 pacemaker implanted by Dr Dulce Sellar  . Incisional hernia repair  10/2010    incarcerated  .  Common iliac  06/2009    bilateral kissing stents  . Pseudoaneurysm repair  09/1999    right groin  . Av fistula repair  04/2007    left upper arm  . Cardiac catheterization  02/10, 03/12    Most recent showed 3 vessel CAD with patent grafts: SVG to LAD, SVG to PDA and SVG to OM  . Coronary angioplasty with stent placement  09/1999    "1"  . Coronary artery bypass graft  02/2007    CABG X3  . Tee without cardioversion N/A 12/02/2013    Procedure: TRANSESOPHAGEAL ECHOCARDIOGRAM (TEE);  Surgeon: Lewayne Bunting, MD;  Location: [facility] ENDOSCOPY;  Service: Cardiovascular;  Laterality: N/A;   History reviewed. No pertinent family history. History  Substance Use Topics  . Smoking status: Current Every Day Smoker -- 0.50 packs/day for 40 years    Types: Cigarettes  . Smokeless tobacco: Never Used  . Alcohol Use: No   OB History   Grav Para Term Preterm Abortions TAB SAB Ect Mult Living                 Review of Systems  Gastrointestinal: Positive for constipation.  All other systems reviewed and are negative.     Allergies  Ivp dye; Lisinopril; and Iohexol  Home Medications  Prior to Admission medications   Medication Sig Start Date End Date Taking? Authorizing Provider  acetaminophen (TYLENOL) 325 MG tablet Take 2 tablets (650 mg total) by mouth every 6 (six) hours as needed for mild pain, fever or headache. 12/03/13   Calvert CantorSaima Rizwan, MD  ALPRAZolam Prudy Feeler(XANAX) 0.5 MG tablet Take 0.5 mg by mouth 3 (three) times daily as needed for anxiety.    Historical Provider, MD  aspirin EC 81 MG tablet Take 81 mg by mouth daily.    Historical Provider, MD  atorvastatin (LIPITOR) 80 MG tablet Take 80 mg by mouth daily.    Historical Provider, MD  cinacalcet (SENSIPAR) 60 MG tablet Take 60 mg by mouth daily.    Historical Provider, MD  dextromethorphan (DELSYM) 30 MG/5ML liquid Take 30 mg by mouth every 12 (twelve) hours as needed for cough.     Historical Provider, MD  docusate sodium (COLACE) 100 MG  capsule Take 100 mg by mouth daily.     Historical Provider, MD  doxycycline (VIBRA-TABS) 100 MG tablet Take 1 tablet (100 mg total) by mouth every 12 (twelve) hours. 03/19/14   Christiane Haorinna L Sullivan, MD  Fiber, Guar Gum, CHEW Chew 1 each by mouth daily.    Historical Provider, MD  folic acid (FOLVITE) 400 MCG tablet Take 400 mcg by mouth daily.    Historical Provider, MD  furosemide (LASIX) 80 MG tablet Take 80 mg by mouth 2 (two) times daily.     Historical Provider, MD  hydrALAZINE (APRESOLINE) 25 MG tablet Take 25 mg by mouth 3 (three) times daily.    Historical Provider, MD  ipratropium (ATROVENT) 0.02 % nebulizer solution Take 0.5 mg by nebulization 2 (two) times daily as needed for wheezing or shortness of breath.    Historical Provider, MD  levothyroxine (SYNTHROID, LEVOTHROID) 150 MCG tablet Take 150 mcg by mouth daily before breakfast.    Historical Provider, MD  megestrol (MEGACE) 40 MG/ML suspension Take 600 mg by mouth daily.  03/11/14   Historical Provider, MD  nitroGLYCERIN (NITROSTAT) 0.4 MG SL tablet Place 0.4 mg under the tongue every 5 (five) minutes as needed for chest pain.     Historical Provider, MD  olopatadine (PATANOL) 0.1 % ophthalmic solution Place 1 drop into both eyes 2 (two) times daily. Affected eye    Historical Provider, MD  oxyCODONE-acetaminophen (PERCOCET) 7.5-325 MG per tablet Take 1 tablet by mouth every 8 (eight) hours as needed for pain.     Historical Provider, MD  pramoxine-mineral oil-zinc (TUCKS) 1-12.5 % rectal ointment Place rectally 2 (two) times daily as needed for itching or hemorrhoids. 11/16/13   Hollice EspySendil K Krishnan, MD  predniSONE (DELTASONE) 10 MG tablet 4 tablets daily for 3 days, then decrease by 1 tablet every 3 days until off 03/19/14   Christiane Haorinna L Sullivan, MD   BP 173/66  Pulse 97  Temp(Src) 97.4 F (36.3 C)  Resp 26  Ht 5\' 3"  (1.6 m)  Wt 100 lb (45.36 kg)  BMI 17.72 kg/m2  SpO2 96% Physical Exam  Nursing note and vitals  reviewed. Constitutional: She is oriented to person, place, and time. She appears well-developed and well-nourished.  Non-toxic appearance. No distress.  HENT:  Head: Normocephalic and atraumatic.  Eyes: Conjunctivae, EOM and lids are normal. Pupils are equal, round, and reactive to light.  Neck: Normal range of motion. Neck supple. No tracheal deviation present. No mass present.  Cardiovascular: Normal rate, regular rhythm and normal heart sounds.  Exam reveals no gallop.  No murmur heard. Pulmonary/Chest: Effort normal and breath sounds normal. No stridor. No respiratory distress. She has no decreased breath sounds. She has no wheezes. She has no rhonchi. She has no rales.  Abdominal: Soft. Normal appearance and bowel sounds are normal. She exhibits no distension. There is generalized tenderness. There is no rebound and no CVA tenderness.  Musculoskeletal: Normal range of motion. She exhibits no edema and no tenderness.  Neurological: She is alert and oriented to person, place, and time. She has normal strength. No cranial nerve deficit or sensory deficit. GCS eye subscore is 4. GCS verbal subscore is 5. GCS motor subscore is 6.  Skin: Skin is warm and dry. No abrasion and no rash noted.  Psychiatric: Her affect is blunt. Her speech is delayed. She is slowed.    ED Course  Procedures (including critical care time) Labs Review Labs Reviewed - No data to display  Imaging Review No results found.   EKG Interpretation None      MDM   Final diagnoses:  None    Will contact triad  hospitalist for admission    Toy BakerAnthony T Regina Ganci, MD 03/31/2014 2141

## 2014-03-23 ENCOUNTER — Inpatient Hospital Stay (HOSPITAL_COMMUNITY): Payer: Medicare Other

## 2014-03-23 ENCOUNTER — Encounter (HOSPITAL_COMMUNITY): Payer: Self-pay | Admitting: General Surgery

## 2014-03-23 ENCOUNTER — Inpatient Hospital Stay (HOSPITAL_COMMUNITY): Payer: Medicare Other | Admitting: Anesthesiology

## 2014-03-23 ENCOUNTER — Encounter (HOSPITAL_COMMUNITY): Payer: Medicare Other | Admitting: Anesthesiology

## 2014-03-23 DIAGNOSIS — I5022 Chronic systolic (congestive) heart failure: Secondary | ICD-10-CM

## 2014-03-23 DIAGNOSIS — A419 Sepsis, unspecified organism: Principal | ICD-10-CM

## 2014-03-23 DIAGNOSIS — K659 Peritonitis, unspecified: Secondary | ICD-10-CM

## 2014-03-23 DIAGNOSIS — J432 Centrilobular emphysema: Secondary | ICD-10-CM

## 2014-03-23 DIAGNOSIS — R6521 Severe sepsis with septic shock: Secondary | ICD-10-CM

## 2014-03-23 DIAGNOSIS — Z992 Dependence on renal dialysis: Secondary | ICD-10-CM

## 2014-03-23 DIAGNOSIS — N186 End stage renal disease: Secondary | ICD-10-CM

## 2014-03-23 DIAGNOSIS — K668 Other specified disorders of peritoneum: Secondary | ICD-10-CM

## 2014-03-23 DIAGNOSIS — E43 Unspecified severe protein-calorie malnutrition: Secondary | ICD-10-CM

## 2014-03-23 DIAGNOSIS — J189 Pneumonia, unspecified organism: Secondary | ICD-10-CM

## 2014-03-23 DIAGNOSIS — K631 Perforation of intestine (nontraumatic): Secondary | ICD-10-CM | POA: Diagnosis present

## 2014-03-23 LAB — RENAL FUNCTION PANEL
ANION GAP: 19 — AB (ref 5–15)
Albumin: 2.4 g/dL — ABNORMAL LOW (ref 3.5–5.2)
BUN: 49 mg/dL — AB (ref 6–23)
CALCIUM: 7.3 mg/dL — AB (ref 8.4–10.5)
CO2: 19 meq/L (ref 19–32)
Chloride: 97 mEq/L (ref 96–112)
Creatinine, Ser: 3.62 mg/dL — ABNORMAL HIGH (ref 0.50–1.10)
GFR calc Af Amer: 13 mL/min — ABNORMAL LOW (ref >90)
GFR calc non Af Amer: 11 mL/min — ABNORMAL LOW (ref >90)
GLUCOSE: 159 mg/dL — AB (ref 70–99)
POTASSIUM: 4.9 meq/L (ref 3.7–5.3)
Phosphorus: 4.8 mg/dL — ABNORMAL HIGH (ref 2.3–4.6)
SODIUM: 135 meq/L — AB (ref 137–147)

## 2014-03-23 LAB — URINALYSIS, ROUTINE W REFLEX MICROSCOPIC
Bilirubin Urine: NEGATIVE
Glucose, UA: 500 mg/dL — AB
Ketones, ur: NEGATIVE mg/dL
LEUKOCYTES UA: NEGATIVE
Nitrite: NEGATIVE
PROTEIN: 100 mg/dL — AB
Specific Gravity, Urine: 1.015 (ref 1.005–1.030)
UROBILINOGEN UA: 0.2 mg/dL (ref 0.0–1.0)
pH: 7.5 (ref 5.0–8.0)

## 2014-03-23 LAB — BLOOD GAS, ARTERIAL
ACID-BASE DEFICIT: 3.4 mmol/L — AB (ref 0.0–2.0)
Acid-base deficit: 3.8 mmol/L — ABNORMAL HIGH (ref 0.0–2.0)
BICARBONATE: 21.6 meq/L (ref 20.0–24.0)
Bicarbonate: 21.4 mEq/L (ref 20.0–24.0)
Drawn by: 41977
Drawn by: 41977
FIO2: 1 %
FIO2: 0.5 %
MECHVT: 500 mL
O2 SAT: 98.3 %
O2 Saturation: 98.2 %
PATIENT TEMPERATURE: 98.6
PEEP: 5 cmH2O
PEEP: 5 cmH2O
PO2 ART: 144 mmHg — AB (ref 80.0–100.0)
Patient temperature: 98.6
RATE: 15 resp/min
RATE: 17 resp/min
TCO2: 23 mmol/L (ref 0–100)
TCO2: 22.6 mmol/L (ref 0–100)
VT: 500 mL
pCO2 arterial: 45.4 mmHg — ABNORMAL HIGH (ref 35.0–45.0)
pCO2 arterial: 40.4 mmHg (ref 35.0–45.0)
pH, Arterial: 7.299 — ABNORMAL LOW (ref 7.350–7.450)
pH, Arterial: 7.344 — ABNORMAL LOW (ref 7.350–7.450)
pO2, Arterial: 137 mmHg — ABNORMAL HIGH (ref 80.0–100.0)

## 2014-03-23 LAB — POCT I-STAT 7, (LYTES, BLD GAS, ICA,H+H)
Acid-base deficit: 1 mmol/L (ref 0.0–2.0)
Bicarbonate: 25.2 mEq/L — ABNORMAL HIGH (ref 20.0–24.0)
Calcium, Ion: 1.04 mmol/L — ABNORMAL LOW (ref 1.13–1.30)
HCT: 41 % (ref 36.0–46.0)
HEMOGLOBIN: 13.9 g/dL (ref 12.0–15.0)
O2 Saturation: 100 %
POTASSIUM: 3.9 meq/L (ref 3.7–5.3)
Patient temperature: 35.9
Sodium: 139 mEq/L (ref 137–147)
TCO2: 27 mmol/L (ref 0–100)
pCO2 arterial: 45.2 mmHg — ABNORMAL HIGH (ref 35.0–45.0)
pH, Arterial: 7.349 — ABNORMAL LOW (ref 7.350–7.450)
pO2, Arterial: 229 mmHg — ABNORMAL HIGH (ref 80.0–100.0)

## 2014-03-23 LAB — MRSA PCR SCREENING: MRSA BY PCR: NEGATIVE

## 2014-03-23 LAB — TYPE AND SCREEN
ABO/RH(D): O POS
Antibody Screen: NEGATIVE

## 2014-03-23 LAB — GLUCOSE, CAPILLARY
GLUCOSE-CAPILLARY: 59 mg/dL — AB (ref 70–99)
Glucose-Capillary: 138 mg/dL — ABNORMAL HIGH (ref 70–99)
Glucose-Capillary: 128 mg/dL — ABNORMAL HIGH (ref 70–99)
Glucose-Capillary: 89 mg/dL (ref 70–99)
Glucose-Capillary: 73 mg/dL (ref 70–99)
Glucose-Capillary: 76 mg/dL (ref 70–99)
Glucose-Capillary: 142 mg/dL — ABNORMAL HIGH (ref 70–99)

## 2014-03-23 LAB — COMPREHENSIVE METABOLIC PANEL
ALBUMIN: 2.6 g/dL — AB (ref 3.5–5.2)
ALT: 15 U/L (ref 0–35)
ANION GAP: 20 — AB (ref 5–15)
AST: 19 U/L (ref 0–37)
Alkaline Phosphatase: 96 U/L (ref 39–117)
BILIRUBIN TOTAL: 0.5 mg/dL (ref 0.3–1.2)
BUN: 73 mg/dL — ABNORMAL HIGH (ref 6–23)
CO2: 19 mEq/L (ref 19–32)
CREATININE: 5.3 mg/dL — AB (ref 0.50–1.10)
Calcium: 7.3 mg/dL — ABNORMAL LOW (ref 8.4–10.5)
Chloride: 104 mEq/L (ref 96–112)
GFR calc Af Amer: 8 mL/min — ABNORMAL LOW (ref >90)
GFR calc non Af Amer: 7 mL/min — ABNORMAL LOW (ref >90)
Glucose, Bld: 127 mg/dL — ABNORMAL HIGH (ref 70–99)
Potassium: 4.5 mEq/L (ref 3.7–5.3)
Sodium: 143 mEq/L (ref 137–147)
TOTAL PROTEIN: 4.8 g/dL — AB (ref 6.0–8.3)

## 2014-03-23 LAB — CBC
HEMATOCRIT: 43.2 % (ref 36.0–46.0)
HEMOGLOBIN: 13.4 g/dL (ref 12.0–15.0)
MCH: 32.6 pg (ref 26.0–34.0)
MCHC: 31 g/dL (ref 30.0–36.0)
MCV: 105.1 fL — AB (ref 78.0–100.0)
Platelets: 110 10*3/uL — ABNORMAL LOW (ref 150–400)
RBC: 4.11 MIL/uL (ref 3.87–5.11)
RDW: 16.8 % — AB (ref 11.5–15.5)
WBC: 2.6 10*3/uL — AB (ref 4.0–10.5)

## 2014-03-23 LAB — CORTISOL: CORTISOL PLASMA: 31.2 ug/dL

## 2014-03-23 LAB — TROPONIN I
Troponin I: <0.3 ng/mL (ref <0.30)
Troponin I: <0.3 ng/mL (ref <0.30)

## 2014-03-23 LAB — URINE MICROSCOPIC-ADD ON

## 2014-03-23 LAB — PHOSPHORUS: Phosphorus: 4.7 mg/dL — ABNORMAL HIGH (ref 2.3–4.6)

## 2014-03-23 LAB — PROTIME-INR
INR: 1.34 (ref 0.00–1.49)
Prothrombin Time: 16.7 seconds — ABNORMAL HIGH (ref 11.6–15.2)

## 2014-03-23 LAB — LACTIC ACID, PLASMA: Lactic Acid, Venous: 2.1 mmol/L (ref 0.5–2.2)

## 2014-03-23 LAB — MAGNESIUM: Magnesium: 1.5 mg/dL (ref 1.5–2.5)

## 2014-03-23 MED ORDER — FENTANYL CITRATE 0.05 MG/ML IJ SOLN
12.5000 ug | INTRAMUSCULAR | Status: DC | PRN
  Administered 2014-03-23 (×2): 50 ug via INTRAVENOUS
  Filled 2014-03-23 (×2): qty 2

## 2014-03-23 MED ORDER — HEPARIN SODIUM (PORCINE) 1000 UNIT/ML DIALYSIS
1000.0000 [IU] | INTRAMUSCULAR | Status: DC | PRN
  Administered 2014-03-24: 3200 [IU] via INTRAVENOUS_CENTRAL
  Filled 2014-03-23: qty 3
  Filled 2014-03-23: qty 1
  Filled 2014-03-23: qty 6

## 2014-03-23 MED ORDER — PRISMASOL BGK 4/2.5 32-4-2.5 MEQ/L IV SOLN
INTRAVENOUS | Status: DC
  Administered 2014-03-23 – 2014-03-25 (×13): via INTRAVENOUS_CENTRAL
  Filled 2014-03-23 (×18): qty 5000

## 2014-03-23 MED ORDER — 0.9 % SODIUM CHLORIDE (POUR BTL) OPTIME
TOPICAL | Status: DC | PRN
  Administered 2014-03-23: 1000 mL

## 2014-03-23 MED ORDER — CHLORHEXIDINE GLUCONATE 0.12 % MT SOLN: 15.0000 mL | Freq: Two times a day (BID) | OROMUCOSAL | Status: DC

## 2014-03-23 MED ORDER — LACTATED RINGERS IV SOLN
INTRAVENOUS | Status: DC | PRN
  Administered 2014-03-23: via INTRAVENOUS

## 2014-03-23 MED ORDER — SODIUM CHLORIDE 0.9 % IV SOLN
25.0000 ug/h | INTRAVENOUS | Status: DC
  Administered 2014-03-23: 150 ug/h via INTRAVENOUS
  Administered 2014-03-23: 100 ug/h via INTRAVENOUS
  Filled 2014-03-23 (×3): qty 50

## 2014-03-23 MED ORDER — ALBUMIN HUMAN 5 % IV SOLN
INTRAVENOUS | Status: DC | PRN
  Administered 2014-03-23 (×2): via INTRAVENOUS

## 2014-03-23 MED ORDER — PRAMOXINE-ZINC OXIDE IN MO 1-12.5 % RE OINT
TOPICAL_OINTMENT | Freq: Two times a day (BID) | RECTAL | Status: DC | PRN
  Filled 2014-03-23: qty 28.3

## 2014-03-23 MED ORDER — SODIUM CHLORIDE 0.9 % IJ SOLN
3.0000 mL | Freq: Two times a day (BID) | INTRAMUSCULAR | Status: DC
  Administered 2014-03-23 (×3): 3 mL via INTRAVENOUS

## 2014-03-23 MED ORDER — CETYLPYRIDINIUM CHLORIDE 0.05 % MT LIQD
7.0000 mL | Freq: Four times a day (QID) | OROMUCOSAL | Status: DC
  Administered 2014-03-24 – 2014-03-28 (×18): 7 mL via OROMUCOSAL

## 2014-03-23 MED ORDER — HYDROMORPHONE HCL 1 MG/ML IJ SOLN: 0.2500 mg | INTRAMUSCULAR | Status: DC | PRN

## 2014-03-23 MED ORDER — TRACE MINERALS CR-CU-F-FE-I-MN-MO-SE-ZN IV SOLN
INTRAVENOUS | Status: AC
  Administered 2014-03-23: 22:00:00 via INTRAVENOUS
  Filled 2014-03-23: qty 1000

## 2014-03-23 MED ORDER — OLOPATADINE HCL 0.1 % OP SOLN
1.0000 [drp] | Freq: Two times a day (BID) | OPHTHALMIC | Status: DC
  Administered 2014-03-23 – 2014-04-06 (×31): 1 [drp] via OPHTHALMIC
  Filled 2014-03-23: qty 5

## 2014-03-23 MED ORDER — ALBUTEROL SULFATE (2.5 MG/3ML) 0.083% IN NEBU: 2.5000 mg | INHALATION_SOLUTION | Freq: Four times a day (QID) | RESPIRATORY_TRACT | Status: DC

## 2014-03-23 MED ORDER — SODIUM CHLORIDE 0.9 % IV SOLN: INTRAVENOUS | Status: DC

## 2014-03-23 MED ORDER — SODIUM CHLORIDE 0.9 % IV SOLN: 250.0000 mL | INTRAVENOUS | Status: DC | PRN

## 2014-03-23 MED ORDER — ALBUTEROL SULFATE (2.5 MG/3ML) 0.083% IN NEBU: 2.5000 mg | INHALATION_SOLUTION | RESPIRATORY_TRACT | Status: DC | PRN

## 2014-03-23 MED ORDER — VANCOMYCIN HCL 500 MG IV SOLR
500.0000 mg | INTRAVENOUS | Status: DC
  Administered 2014-03-24: 500 mg via INTRAVENOUS
  Filled 2014-03-23 (×2): qty 500

## 2014-03-23 MED ORDER — DEXTROSE-NACL 5-0.45 % IV SOLN
INTRAVENOUS | Status: AC
  Administered 2014-03-23: 11:00:00 via INTRAVENOUS

## 2014-03-23 MED ORDER — HEPARIN (PORCINE) 2000 UNITS/L FOR CRRT
INTRAVENOUS_CENTRAL | Status: DC | PRN
Start: 2014-03-23 — End: 2014-03-25
  Filled 2014-03-23: qty 1000

## 2014-03-23 MED ORDER — HEPARIN SODIUM (PORCINE) 1000 UNIT/ML DIALYSIS
1000.0000 [IU] | INTRAMUSCULAR | Status: DC | PRN
  Administered 2014-03-23: 3200 [IU] via INTRAVENOUS_CENTRAL
  Filled 2014-03-23: qty 6

## 2014-03-23 MED ORDER — ARTIFICIAL TEARS OP OINT
TOPICAL_OINTMENT | OPHTHALMIC | Status: DC | PRN
  Administered 2014-03-23: 1 via OPHTHALMIC

## 2014-03-23 MED ORDER — SODIUM CHLORIDE 0.9 % IV SOLN
INTRAVENOUS | Status: DC | PRN
  Administered 2014-03-23 (×3): via INTRAVENOUS

## 2014-03-23 MED ORDER — PROPOFOL 10 MG/ML IV BOLUS
INTRAVENOUS | Status: DC | PRN
  Administered 2014-03-23: 50 mg via INTRAVENOUS

## 2014-03-23 MED ORDER — PRISMASOL BGK 4/2.5 32-4-2.5 MEQ/L IV SOLN
INTRAVENOUS | Status: DC
  Administered 2014-03-23 – 2014-03-24 (×2): via INTRAVENOUS_CENTRAL
  Filled 2014-03-23 (×3): qty 5000

## 2014-03-23 MED ORDER — SODIUM CHLORIDE 0.45 % IV SOLN
INTRAVENOUS | Status: DC
  Administered 2014-03-23: 17:00:00 via INTRAVENOUS
  Administered 2014-03-23: 500 mL via INTRAVENOUS
  Administered 2014-03-24 – 2014-03-25 (×2): via INTRAVENOUS
  Administered 2014-03-26: 40 mL/h via INTRAVENOUS
  Administered 2014-03-26 – 2014-03-27 (×2): via INTRAVENOUS

## 2014-03-23 MED ORDER — LIDOCAINE HCL (CARDIAC) 20 MG/ML IV SOLN
INTRAVENOUS | Status: DC | PRN
  Administered 2014-03-23: 30 mg via INTRAVENOUS

## 2014-03-23 MED ORDER — VECURONIUM BROMIDE 10 MG IV SOLR
INTRAVENOUS | Status: DC | PRN
  Administered 2014-03-23: 4 mg via INTRAVENOUS

## 2014-03-23 MED ORDER — ARFORMOTEROL TARTRATE 15 MCG/2ML IN NEBU
15.0000 ug | INHALATION_SOLUTION | Freq: Two times a day (BID) | RESPIRATORY_TRACT | Status: DC
Start: 2014-03-23 — End: 2014-04-08
  Administered 2014-03-23 – 2014-04-07 (×29): 15 ug via RESPIRATORY_TRACT
  Filled 2014-03-23 (×34): qty 2

## 2014-03-23 MED ORDER — SODIUM CHLORIDE 0.9 % IJ SOLN: 3.0000 mL | INTRAMUSCULAR | Status: DC | PRN

## 2014-03-23 MED ORDER — PRISMASOL BGK 4/2.5 32-4-2.5 MEQ/L IV SOLN
INTRAVENOUS | Status: DC
  Administered 2014-03-23 – 2014-03-25 (×3): via INTRAVENOUS_CENTRAL
  Filled 2014-03-23 (×5): qty 5000

## 2014-03-23 MED ORDER — FENTANYL BOLUS VIA INFUSION
20.0000 ug | INTRAVENOUS | Status: DC | PRN
  Administered 2014-03-24: 20 ug via INTRAVENOUS
  Filled 2014-03-23: qty 20

## 2014-03-23 MED ORDER — NOREPINEPHRINE BITARTRATE 1 MG/ML IV SOLN
0.0000 ug/min | INTRAVENOUS | Status: DC
  Administered 2014-03-23: 25 ug/min via INTRAVENOUS
  Administered 2014-03-23: 2 ug/min via INTRAVENOUS
  Administered 2014-03-24: 30 ug/min via INTRAVENOUS
  Filled 2014-03-23 (×4): qty 16

## 2014-03-23 MED ORDER — FENTANYL CITRATE 0.05 MG/ML IJ SOLN
INTRAMUSCULAR | Status: DC | PRN
  Administered 2014-03-23 (×2): 50 ug via INTRAVENOUS
  Administered 2014-03-23: 100 ug via INTRAVENOUS
  Administered 2014-03-23: 50 ug via INTRAVENOUS

## 2014-03-23 MED ORDER — PANTOPRAZOLE SODIUM 40 MG IV SOLR
40.0000 mg | Freq: Every day | INTRAVENOUS | Status: DC
  Administered 2014-03-23 – 2014-03-27 (×5): 40 mg via INTRAVENOUS
  Filled 2014-03-23 (×5): qty 40

## 2014-03-23 MED ORDER — CETYLPYRIDINIUM CHLORIDE 0.05 % MT LIQD
7.0000 mL | Freq: Two times a day (BID) | OROMUCOSAL | Status: DC
  Administered 2014-03-23 (×2): 7 mL via OROMUCOSAL

## 2014-03-23 MED ORDER — HEPARIN SODIUM (PORCINE) 5000 UNIT/ML IJ SOLN
5000.0000 [IU] | Freq: Three times a day (TID) | INTRAMUSCULAR | Status: DC
  Administered 2014-03-23 – 2014-03-24 (×3): 5000 [IU] via SUBCUTANEOUS
  Filled 2014-03-23 (×6): qty 1

## 2014-03-23 MED ORDER — ALTEPLASE 2 MG IJ SOLR
2.0000 mg | Freq: Once | INTRAMUSCULAR | Status: AC | PRN
  Filled 2014-03-23: qty 2

## 2014-03-23 MED ORDER — PIPERACILLIN-TAZOBACTAM IN DEX 2-0.25 GM/50ML IV SOLN
2.2500 g | Freq: Four times a day (QID) | INTRAVENOUS | Status: DC
  Administered 2014-03-23 – 2014-03-25 (×7): 2.25 g via INTRAVENOUS
  Filled 2014-03-23 (×8): qty 50

## 2014-03-23 MED ORDER — BUDESONIDE 0.25 MG/2ML IN SUSP
0.5000 mg | Freq: Two times a day (BID) | RESPIRATORY_TRACT | Status: DC
  Filled 2014-03-23 (×3): qty 4

## 2014-03-23 MED ORDER — DEXTROSE 50 % IV SOLN
25.0000 mL | Freq: Once | INTRAVENOUS | Status: AC | PRN
  Administered 2014-03-23: 25 mL via INTRAVENOUS
  Filled 2014-03-23: qty 50

## 2014-03-23 MED ORDER — PHENYLEPHRINE HCL 10 MG/ML IJ SOLN
10.0000 mg | INTRAMUSCULAR | Status: DC | PRN
  Administered 2014-03-23: 50 ug/min via INTRAVENOUS

## 2014-03-23 MED ORDER — MIDAZOLAM HCL 2 MG/2ML IJ SOLN
2.0000 mg | INTRAMUSCULAR | Status: AC | PRN
  Administered 2014-03-23 – 2014-03-24 (×3): 2 mg via INTRAVENOUS
  Filled 2014-03-23 (×3): qty 2

## 2014-03-23 MED ORDER — SODIUM CHLORIDE 0.9 % IJ SOLN
3.0000 mL | Freq: Two times a day (BID) | INTRAMUSCULAR | Status: DC
  Administered 2014-03-23 – 2014-03-24 (×4): 3 mL via INTRAVENOUS

## 2014-03-23 MED ORDER — DEXTROSE 5 % IV SOLN
0.0000 ug/min | INTRAVENOUS | Status: DC
  Administered 2014-03-23: 15 ug/min via INTRAVENOUS
  Administered 2014-03-23: 75 ug/min via INTRAVENOUS
  Filled 2014-03-23 (×2): qty 1

## 2014-03-23 MED ORDER — IPRATROPIUM BROMIDE 0.02 % IN SOLN
0.5000 mg | Freq: Four times a day (QID) | RESPIRATORY_TRACT | Status: DC
  Administered 2014-03-23 – 2014-03-25 (×7): 0.5 mg via RESPIRATORY_TRACT
  Filled 2014-03-23 (×7): qty 2.5

## 2014-03-23 MED ORDER — INSULIN ASPART 100 UNIT/ML ~~LOC~~ SOLN
0.0000 [IU] | SUBCUTANEOUS | Status: DC
  Administered 2014-03-24 (×2): 1 [IU] via SUBCUTANEOUS

## 2014-03-23 MED ORDER — CHLORHEXIDINE GLUCONATE 0.12 % MT SOLN
15.0000 mL | Freq: Two times a day (BID) | OROMUCOSAL | Status: DC
  Administered 2014-03-23 – 2014-03-27 (×10): 15 mL via OROMUCOSAL
  Filled 2014-03-23 (×13): qty 15

## 2014-03-23 MED ORDER — BUDESONIDE 0.5 MG/2ML IN SUSP
0.5000 mg | Freq: Two times a day (BID) | RESPIRATORY_TRACT | Status: DC
  Administered 2014-03-23 – 2014-04-07 (×29): 0.5 mg via RESPIRATORY_TRACT
  Filled 2014-03-23 (×33): qty 2

## 2014-03-23 MED ORDER — FAT EMULSION 20 % IV EMUL
240.0000 mL | INTRAVENOUS | Status: AC
  Administered 2014-03-23: 240 mL via INTRAVENOUS
  Filled 2014-03-23: qty 250

## 2014-03-23 MED ORDER — ONDANSETRON HCL 4 MG/2ML IJ SOLN: 4.0000 mg | Freq: Once | INTRAMUSCULAR | Status: DC | PRN

## 2014-03-23 MED ORDER — LEVOTHYROXINE SODIUM 150 MCG PO TABS
150.0000 ug | ORAL_TABLET | Freq: Every day | ORAL | Status: DC
  Filled 2014-03-23 (×4): qty 1

## 2014-03-23 NOTE — Consult Note (Signed)
Renal Service Consult Note [facility]  [name] 03/23/2014 Maree Krabbe Requesting Physician:  Dr Kendrick Fries  Reason for Consult:  ESRD pt with septic shock after ruptured sigmoid colon HPI: The patient is a 80 y.o. year-old hx of afib, CM EF 15%, ESRD on HD, carotid art disease, s/p CABG, HTN and HL presented with no BM and abd pain to [place]. Found to have pneumoperitoneum and transferred to [facility]. Underwent exlap last night and was found to have ischemic bowel. Portion of sigmoid colon resected. No ostomy or closure down, wound open with VAC. Pt in septic shock on pressors and VDRF in ED now.  Has ESRD , asked to see for RRT.   ROS  n/a  Past Medical History  Past Medical History  Diagnosis Date  . Carotid artery occlusion     Bilateral carotid artery disease  . Peripheral artery disease   . CHF (congestive heart failure)     EF 15-20% on TEE 12/15/13  . Hypertension   . Hyperlipidemia   . ESRD on hemodialysis 06/20/11    M, W, F; [address], [place].  Started HD in Sept 2008  . AAA (abdominal aortic aneurysm)   . Stroke 2007  . Duodenal ulcer 2008  . S/P CABG (coronary artery bypass graft)     CABG in 2008, had 3 MI's prior  . Depression   . Pneumonia   . COPD, severe   . Hypothyroidism   . Anemia   . Pacemaker   . Atrial fibrillation   . Complete heart block    Past Surgical History  Past Surgical History  Procedure Laterality Date  . Thyroid surgery  2004  . Dg av dialysis  shunt access exist*l* or  03/2000    left upper arm  . Vaginal hysterectomy  1976  . Hemorrhoid surgery  1971  . Insert / replace / remove pacemaker  1990's    initial placement  . Insert / replace / remove pacemaker  05/2011    MDT ADDRL1 pacemaker implanted by Dr Dulce Sellar  . Incisional hernia repair  10/2010    incarcerated  . Common iliac  06/2009    bilateral kissing stents  . Pseudoaneurysm repair  09/1999    right groin  . Av fistula repair  04/2007    left  upper arm  . Cardiac catheterization  02/10, 03/12    Most recent showed 3 vessel CAD with patent grafts: SVG to LAD, SVG to PDA and SVG to OM  . Coronary angioplasty with stent placement  09/1999    "1"  . Coronary artery bypass graft  02/2007    CABG X3  . Tee without cardioversion N/A 12/02/2013    Procedure: TRANSESOPHAGEAL ECHOCARDIOGRAM (TEE);  Surgeon: Lewayne Bunting, MD;  Location: [facility] ENDOSCOPY;  Service: Cardiovascular;  Laterality: N/A;  . Laparotomy N/A 2014-04-04    Procedure: EXPLORATORY LAPAROTOMY;  Surgeon: Atilano Ina, MD;  Location: [facility] OR;  Service: General;  Laterality: N/A;  . Application of wound vac N/A 2014/04/04    Procedure: APPLICATION OF OPEN WOUND VAC;  Surgeon: Atilano Ina, MD;  Location: [facility] OR;  Service: General;  Laterality: N/A;  . Colostomy revision N/A 04/04/14    Procedure: COLON RESECTION SIGMOID;  Surgeon: Atilano Ina, MD;  Location: [facility] OR;  Service: General;  Laterality: N/A;   Family History History reviewed. No pertinent family history. Social History  reports that she has been smoking Cigarettes.  She has a  20 pack-year smoking history. She has never used smokeless tobacco. She reports that she does not drink alcohol or use illicit drugs. Allergies  Allergies  Allergen Reactions  . Ivp Dye [Iodinated Diagnostic Agents] Swelling  . Lisinopril Swelling  . Iohexol Itching and Swelling        Home medications Prior to Admission medications   Medication Sig Start Date End Date Taking? Authorizing Provider  acetaminophen (TYLENOL) 325 MG tablet Take 2 tablets (650 mg total) by mouth every 6 (six) hours as needed for mild pain, fever or headache. 12/03/13  Yes Calvert CantorSaima Rizwan, MD  ALPRAZolam Prudy Feeler(XANAX) 0.5 MG tablet Take 0.5 mg by mouth 3 (three) times daily as needed for anxiety.   Yes Historical Provider, MD  aspirin EC 81 MG tablet Take 81 mg by mouth daily.   Yes Historical Provider, MD  atorvastatin (LIPITOR) 80 MG tablet Take 80 mg by mouth daily.    Yes Historical Provider, MD  cinacalcet (SENSIPAR) 60 MG tablet Take 60 mg by mouth daily.   Yes Historical Provider, MD  dextromethorphan (DELSYM) 30 MG/5ML liquid Take 30 mg by mouth every 12 (twelve) hours as needed for cough.    Yes Historical Provider, MD  docusate sodium (COLACE) 100 MG capsule Take 100 mg by mouth daily.    Yes Historical Provider, MD  doxycycline (VIBRA-TABS) 100 MG tablet Take 1 tablet (100 mg total) by mouth every 12 (twelve) hours. 03/19/14  Yes Christiane Haorinna L Sullivan, MD  Fiber, Guar Gum, CHEW Chew 1 each by mouth daily.   Yes Historical Provider, MD  folic acid (FOLVITE) 400 MCG tablet Take 400 mcg by mouth daily.   Yes Historical Provider, MD  furosemide (LASIX) 80 MG tablet Take 80 mg by mouth 2 (two) times daily.    Yes Historical Provider, MD  hydrALAZINE (APRESOLINE) 25 MG tablet Take 25 mg by mouth 3 (three) times daily.   Yes Historical Provider, MD  ipratropium (ATROVENT) 0.02 % nebulizer solution Take 0.5 mg by nebulization 2 (two) times daily as needed for wheezing or shortness of breath.   Yes Historical Provider, MD  levothyroxine (SYNTHROID, LEVOTHROID) 150 MCG tablet Take 150 mcg by mouth daily before breakfast.   Yes Historical Provider, MD  megestrol (MEGACE) 40 MG/ML suspension Take 600 mg by mouth daily.  03/11/14  Yes Historical Provider, MD  olopatadine (PATANOL) 0.1 % ophthalmic solution Place 1 drop into both eyes 2 (two) times daily. Affected eye   Yes Historical Provider, MD  oxyCODONE-acetaminophen (PERCOCET) 7.5-325 MG per tablet Take 1 tablet by mouth every 8 (eight) hours as needed for pain.    Yes Historical Provider, MD  pramoxine-mineral oil-zinc (TUCKS) 1-12.5 % rectal ointment Place rectally 2 (two) times daily as needed for itching or hemorrhoids. 11/16/13  Yes Hollice EspySendil K Krishnan, MD  predniSONE (DELTASONE) 10 MG tablet 4 tablets daily for 3 days, then decrease by 1 tablet every 3 days until off 03/19/14  Yes Christiane Haorinna L Sullivan, MD   nitroGLYCERIN (NITROSTAT) 0.4 MG SL tablet Place 0.4 mg under the tongue every 5 (five) minutes as needed for chest pain.     Historical Provider, MD   Liver Function Tests  Recent Labs Lab 03/18/14 0500 03/19/14 0639 03/23/14 0448  AST  --   --  19  ALT  --   --  15  ALKPHOS  --   --  96  BILITOT  --   --  0.5  PROT  --   --  4.8*  ALBUMIN 2.8* 2.8* 2.6*   No results found for this basename: LIPASE, AMYLASE,  in the last 168 hours CBC  Recent Labs Lab 03/17/14 0711 03/19/14 0639 03/23/14 0106 03/23/14 0448  WBC 7.5 5.3  --  2.6*  HGB 11.7* 11.9* 13.9 13.4  HCT 38.2 38.1 41.0 43.2  MCV 108.8* 104.1*  --  105.1*  PLT 112* 113*  --  110*   Basic Metabolic Panel  Recent Labs Lab 03/17/14 0711 03/18/14 0500 03/19/14 0639 03/23/14 0106 03/23/14 0448 03/23/14 0500  NA 135* 139 134* 139 143  --   K 6.5* 4.9 5.5* 3.9 4.5  --   CL 95* 97 94*  --  104  --   CO2 24 29 24   --  19  --   GLUCOSE 113* 119* 129*  --  127*  --   BUN 53* 45* 78*  --  73*  --   CREATININE 6.36* 4.80* 5.86*  --  5.30*  --   CALCIUM 7.8* 8.1* 8.2*  --  7.3*  --   PHOS 7.3* 5.4* 4.3  --   --  4.7*    Filed Vitals:   03/23/14 1300 03/23/14 1315 03/23/14 1330 03/23/14 1345  BP: 96/49     Pulse:      Temp:      TempSrc:      Resp: 18 20 19 20   Height:      Weight:      SpO2:       Exam Elderly adult female, opens eyes to voice, on vent No rash, cyanosis or gangrene Sclera anicteric, throat clear No jvd Chest clear BS bilat RRR 3/6 SEM no rub, distant heart sounds Abd large midline vac system in place, dec'd BS, nondistended No LE or UE edema Neuro is on vent, sedated, arouses to voice  HD: MWF [place]  Assessment: 1 Ruptured sigmoid colon / ischemia bowel - s/p partial colon resection 2 Septic shock on pressors 3 ESRD on HD 4 Hx CABG 5 Anemia get records 6 Afib / hx CHB w PPM / hx CABG / CM EF 15-20% 7 Hx CVA 8 Hx endocarditis July 2015   Plan- recommend CRRT for now, have  d/w primary RN. No heparin or citrate, keep even for now.   Vinson Moselleob Makela Niehoff MD (pgr) [phone]    [phone]) [phone] 03/23/2014, 2:23 PM

## 2014-03-23 NOTE — H&P (Addendum)
PULMONARY / CRITICAL CARE MEDICINE   Name: [name] MRN: [id number] DOB: October 01, 1932    ADMISSION DATE:  04/11/14 CONSULTATION DATE:  03/23/2014  REFERRING MD :  Gaynelle Adu, MD  CHIEF COMPLAINT:  Abdominal pain  INITIAL PRESENTATION: 80 y.o. female with history of CAD s/p CABG, systolic CHF, COPD, tobacco use, ESRD presenting with pneumoperitoneum. Difficult extubation expected postoperatively and PCCM was consulted.   STUDIES:  CT Abd 10/20 >> per report, pneumoperitoneum with diffusely thickened small bowel  SIGNIFICANT EVENTS: 10/20 To OR for ex lap and removal of necrotic bowel   HISTORY OF PRESENT ILLNESS:  [name] is a 80 y.o. female with a history of systolic CHF (EF 40-98% on TEE 12/15/13), CAD with multiple MIs and 3v CABG in 2008 (with patent grafts in 2012), atrial fibrillation and heart block with pacemaker, bacterial endocarditis in July 2015, CVA, COPD, ESRD (on HD MWF at [facility] in [place]), and duodenal ulcer. She was brought to [facility] for worsening abdominal pain since recent discharge from [facility] for CHF exacerbation. CT abdomen showed free air and thickened small bowel. She was transferred to [facility] where she was taken to the OR and transferred to ICU.   PAST MEDICAL HISTORY :   has a past medical history of Carotid artery occlusion; Peripheral artery disease; CHF (congestive heart failure); Hypertension; Hyperlipidemia; ESRD on hemodialysis (06/20/11); AAA (abdominal aortic aneurysm); Stroke (2007); Duodenal ulcer (2008); S/P CABG (coronary artery bypass graft); Depression; Pneumonia; COPD, severe; Hypothyroidism; Anemia; Pacemaker; Atrial fibrillation; and Complete heart block.  has past surgical history that includes Thyroid surgery (2004); dg av dialysis  shunt access exist*l* or (03/2000); Vaginal hysterectomy (1976); Hemorrhoid surgery (1971); Insert / replace / remove pacemaker (1990's); Insert / replace / remove pacemaker (05/2011); Incisional  hernia repair (10/2010); common iliac (06/2009); Pseudoaneurysm repair (09/1999); AV fistula repair (04/2007); Cardiac catheterization (02/10, 03/12); Coronary angioplasty with stent (09/1999); Coronary artery bypass graft (02/2007); and TEE without cardioversion (N/A, 12/02/2013). Prior to Admission medications   Medication Sig Start Date End Date Taking? Authorizing Provider  acetaminophen (TYLENOL) 325 MG tablet Take 2 tablets (650 mg total) by mouth every 6 (six) hours as needed for mild pain, fever or headache. 12/03/13  Yes Calvert Cantor, MD  ALPRAZolam Prudy Feeler) 0.5 MG tablet Take 0.5 mg by mouth 3 (three) times daily as needed for anxiety.   Yes Historical Provider, MD  aspirin EC 81 MG tablet Take 81 mg by mouth daily.   Yes Historical Provider, MD  atorvastatin (LIPITOR) 80 MG tablet Take 80 mg by mouth daily.   Yes Historical Provider, MD  cinacalcet (SENSIPAR) 60 MG tablet Take 60 mg by mouth daily.   Yes Historical Provider, MD  dextromethorphan (DELSYM) 30 MG/5ML liquid Take 30 mg by mouth every 12 (twelve) hours as needed for cough.    Yes Historical Provider, MD  docusate sodium (COLACE) 100 MG capsule Take 100 mg by mouth daily.    Yes Historical Provider, MD  doxycycline (VIBRA-TABS) 100 MG tablet Take 1 tablet (100 mg total) by mouth every 12 (twelve) hours. 03/19/14  Yes Christiane Ha, MD  Fiber, Guar Gum, CHEW Chew 1 each by mouth daily.   Yes Historical Provider, MD  folic acid (FOLVITE) 400 MCG tablet Take 400 mcg by mouth daily.   Yes Historical Provider, MD  furosemide (LASIX) 80 MG tablet Take 80 mg by mouth 2 (two) times daily.    Yes Historical Provider, MD  hydrALAZINE (APRESOLINE) 25 MG  tablet Take 25 mg by mouth 3 (three) times daily.   Yes Historical Provider, MD  ipratropium (ATROVENT) 0.02 % nebulizer solution Take 0.5 mg by nebulization 2 (two) times daily as needed for wheezing or shortness of breath.   Yes Historical Provider, MD  levothyroxine (SYNTHROID, LEVOTHROID)  150 MCG tablet Take 150 mcg by mouth daily before breakfast.   Yes Historical Provider, MD  megestrol (MEGACE) 40 MG/ML suspension Take 600 mg by mouth daily.  03/11/14  Yes Historical Provider, MD  olopatadine (PATANOL) 0.1 % ophthalmic solution Place 1 drop into both eyes 2 (two) times daily. Affected eye   Yes Historical Provider, MD  oxyCODONE-acetaminophen (PERCOCET) 7.5-325 MG per tablet Take 1 tablet by mouth every 8 (eight) hours as needed for pain.    Yes Historical Provider, MD  pramoxine-mineral oil-zinc (TUCKS) 1-12.5 % rectal ointment Place rectally 2 (two) times daily as needed for itching or hemorrhoids. 11/16/13  Yes Hollice EspySendil K Krishnan, MD  predniSONE (DELTASONE) 10 MG tablet 4 tablets daily for 3 days, then decrease by 1 tablet every 3 days until off 03/19/14  Yes Christiane Haorinna L Sullivan, MD  nitroGLYCERIN (NITROSTAT) 0.4 MG SL tablet Place 0.4 mg under the tongue every 5 (five) minutes as needed for chest pain.     Historical Provider, MD   Allergies  Allergen Reactions  . Ivp Dye [Iodinated Diagnostic Agents] Swelling  . Lisinopril Swelling  . Iohexol Itching and Swelling         FAMILY HISTORY:  has no family status information on file.  SOCIAL HISTORY:  reports that she has been smoking Cigarettes.  She has a 20 pack-year smoking history. She has never used smokeless tobacco. She reports that she does not drink alcohol or use illicit drugs.  REVIEW OF SYSTEMS: Unable to obtain  SUBJECTIVE: Pt sedated  VITAL SIGNS: Temp:  [97.4 F (36.3 C)-97.5 F (36.4 C)] 97.5 F (36.4 C) (10/21 0222) Pulse Rate:  [72-97] 80 (10/20 2330) Resp:  [24-30] 27 (10/20 2330) BP: (129-177)/(57-66) 138/57 mmHg (10/20 2330) SpO2:  [96 %-99 %] 96 % (10/20 2330) FiO2 (%):  [100 %] 100 % (10/21 0230) Weight:  [100 lb (45.36 kg)-107 lb 9.4 oz (48.8 kg)] 107 lb 9.4 oz (48.8 kg) (10/21 0222) HEMODYNAMICS:   VENTILATOR SETTINGS: Vent Mode:  [-] PRVC FiO2 (%):  [100 %] 100 % Set Rate:  [15 bmp]  15 bmp Vt Set:  [500 mL] 500 mL PEEP:  [5 cmH20] 5 cmH20 Plateau Pressure:  [22 cmH20] 22 cmH20 INTAKE / OUTPUT:  Intake/Output Summary (Last 24 hours) at 03/23/14 0305 Last data filed at 03/23/14 [id number]  Gross per 24 hour  Intake   2600 ml  Output    100 ml  Net   2500 ml    PHYSICAL EXAMINATION: General: Elderly female sedated on vent Neuro: RASS -3  HEENT: ETT in place, NGT in R nare Cardiovascular: Regular distant heart sounds without appreciable murmur or JVD. Paced rhythm on monitor. +Thrill in LUE AVF.  Lungs: Scattered wheezes on R > L no crackles.  Abdomen: Wound vac with good seal in midline incision. ND.  Musculoskeletal: No gross deformities. No edema. Skin: No rash, wound or ecchymosis noted.   LABS:  CBC  Recent Labs Lab 03/16/14 0423 03/17/14 0711 03/19/14 0639 03/23/14 0106  WBC 5.4 7.5 5.3  --   HGB 12.3 11.7* 11.9* 13.9  HCT 39.7 38.2 38.1 41.0  PLT 115* 112* 113*  --    Coag's  Recent Labs Lab 2014-01-05 2257  APTT 25  INR 1.20   BMET  Recent Labs Lab 03/17/14 0711 03/18/14 0500 03/19/14 0639 03/23/14 0106  NA 135* 139 134* 139  K 6.5* 4.9 5.5* 3.9  CL 95* 97 94*  --   CO2 24 29 24   --   BUN 53* 45* 78*  --   CREATININE 6.36* 4.80* 5.86*  --   GLUCOSE 113* 119* 129*  --    Electrolytes  Recent Labs Lab 03/17/14 0711 03/18/14 0500 03/19/14 0639  CALCIUM 7.8* 8.1* 8.2*  PHOS 7.3* 5.4* 4.3   Sepsis Markers No results found for this basename: LATICACIDVEN, PROCALCITON, O2SATVEN,  in the last 168 hours ABG  Recent Labs Lab 03/23/14 0106  PHART 7.349*  PCO2ART 45.2*  PO2ART 229.0*   Liver Enzymes  Recent Labs Lab 03/17/14 0711 03/18/14 0500 03/19/14 0639  ALBUMIN 2.9* 2.8* 2.8*   Cardiac Enzymes  Recent Labs Lab 2014-01-05 2257  TROPONINI <0.30   Glucose  Recent Labs Lab 03/23/14 0219  GLUCAP 138*    Imaging No results found.   ASSESSMENT / PLAN:  PULMONARY OETT 10/21>>> A: Postoperative acute  respiratory failure  COPD Tobacco use P:   Full ventilator support VAP prevention bundle BD's prn Add brovana and pulmicort  CARDIOVASCULAR L IJ 10/21>>> A-line 10/21>>> A: Chronic systolic CHF CAD s/p 3v CABG History of atrial fibrillation History of complete heart block with pacemaker Mild shock post surgery > septic +/-cardiogenic P:  Hold hydralazine 25mg  TID, lasix 80mg  BID Trend cardiac markers Daily weights Check lactic acid Use neo for MAP > 65 Check CVP  RENAL A:  ESRD (HD on MWF) P:   No indication for urgent HD, will call renal this AM  GASTROINTESTINAL A:  Perforated viscus with peritonitis Nutrition P:   Per surgery NPO NGT to LIS IVF Abx as below SUP: PPI IV  HEMATOLOGIC A:  History of anemia P:  Hgb stable postop Recheck CBC in AM  INFECTIOUS A:  Peritonitis with septic shock History of bacterial endocarditis July 2015 HCAP P:   resp culture 10/21 >> BCx2 10/21 >>  Abx: Vancomycin 10/20 >>  Abx: Zosyn 10/21 >>  Add diflucan if worse  ENDOCRINE A:  History of hypothyroidism  P:   Continue synthroid 150mcg  NEUROLOGIC A:  Sedation P:   RASS goal: -2 Daily WUA   Family updated: 10/21 following surgery.    Interdisciplinary Family Meeting v Palliative Care Meeting:    TODAY'S SUMMARY: Full vent support, wean as tolerated; Call renal in AM; trend troponin, abx. Plan back to OR 10/22  Ryan B. Jarvis NewcomerGrunz, MD, PGY-2 03/23/2014 3:05 AM  Attending:  I have seen and examined the patient with nurse practitioner/resident and agree with and have edited the note above.   On my exam: awake post op, wheezing, wound vac in place, indicates she is in pain, mild hypotension  Treat as septic shock, but check 12 lead given cardiac issues Will call renal this AM Check cortisol, lactic acid Likely back to OR tomorrow Full vent support No family available on my exam  I have personally obtained a history, examined the patient, evaluated  laboratory and imaging results, formulated the assessment and plan and placed orders. CRITICAL CARE: The patient is critically ill with multiple organ systems failure and requires high complexity decision making for assessment and support, frequent evaluation and titration of therapies, application of advanced monitoring technologies and extensive interpretation of multiple databases. Critical Care Time  devoted to patient care services described in this note is 45 minutes.   Heber Neahkahnie, MD [facility] Pager: [phone] Cell: [phone] If no response, call [phone]  03/23/2014, 3:05 AM

## 2014-03-23 NOTE — Transfer of Care (Signed)
Immediate Anesthesia Transfer of Care Note  Patient: [name]  Procedure(s) Performed: Procedure(s): EXPLORATORY LAPAROTOMY (N/A) APPLICATION OF OPEN WOUND VAC (N/A) COLON RESECTION SIGMOID (N/A)  Patient Location: ICU  Anesthesia Type:General  Level of Consciousness: Patient remains intubated per anesthesia plan  Airway & Oxygen Therapy: Patient remains intubated per anesthesia plan and Patient placed on Ventilator (see vital sign flow sheet for setting)  Post-op Assessment: Report given to PACU RN and Post -op Vital signs reviewed and stable  Post vital signs: Reviewed and stable  Complications: No apparent anesthesia complications

## 2014-03-23 NOTE — Progress Notes (Signed)
ANTIBIOTIC CONSULT NOTE - Follow-up  Pharmacy Consult for vancomycin, zosyn Indication: bowel perf  Allergies  Allergen Reactions  . Ivp Dye [Iodinated Diagnostic Agents] Swelling  . Lisinopril Swelling  . Iohexol Itching and Swelling         Patient Measurements: Height: 5\' 3"  (160 cm) Weight: 107 lb 9.4 oz (48.8 kg) IBW/kg (Calculated) : 52.4  Vital Signs: Temp: 98.3 F (36.8 C) (10/21 1220) Temp Source: Oral (10/21 1220) BP: 134/53 mmHg (10/21 1400) Pulse Rate: 75 (10/21 1242) Intake/Output from previous day: 10/20 0701 - 10/21 0700 In: 3187.5 [I.V.:2637.5; IV Piggyback:550] Out: 660 [Urine:10; Emesis/NG output:100; Drains:450; Blood:100] Intake/Output from this shift: Total I/O In: 607 [I.V.:607] Out: 600 [Drains:600]  Labs:  Recent Labs  03/23/14 0106 03/23/14 0448  WBC  --  2.6*  HGB 13.9 13.4  PLT  --  110*  CREATININE  --  5.30*   Estimated Creatinine Clearance: 6.6 ml/min (by C-G formula based on Cr of 5.3). No results found for this basename: VANCOTROUGH, Leodis BinetVANCOPEAK, VANCORANDOM, GENTTROUGH, GENTPEAK, GENTRANDOM, TOBRATROUGH, TOBRAPEAK, TOBRARND, AMIKACINPEAK, AMIKACINTROU, AMIKACIN,  in the last 72 hours   Microbiology: Recent Results (from the past 720 hour(s))  MRSA PCR SCREENING     Status: None   Collection Time    03/23/14  2:34 AM      Result Value Ref Range Status   MRSA by PCR NEGATIVE  NEGATIVE Final   Comment:            The GeneXpert MRSA Assay (FDA     approved for NASAL specimens     only), is one component of a     comprehensive MRSA colonization     surveillance program. It is not     intended to diagnose MRSA     infection nor to guide or     monitor treatment for     MRSA infections.   Assessment: 80 yo female on Vancomycin and Zosyn Day#1 for bowel perf - s/p OR 10/21 for sigmoid colon resection - plan back to OR 10/22 for re-exploration, possible resection/colostomy. Afeb. WBC low. LA 2.1. Pt ESRD (M/W/F as o/p). To begin  CRRT this afternoon.  10/20 vanc> 10/20 zosyn >  10/21 Urine>> 10/21 Bld x2>> MRSA PCR neg  Goal of Therapy:  Vancomycin trough level 15-20 mcg/ml  Plan:  -Change Zosyn to 2.25 g IV q6h -Vancomycin 500mg  IV q24h - starting 10/22 1400 (~24h post CRRT start) -F/u CRRT tolerance -Monitor cultures, pt's clinical condition  Christoper Fabianaron Edrik Rundle, PharmD, BCPS Clinical pharmacist, pager [phone] 03/23/2014 3:00 PM

## 2014-03-23 NOTE — Anesthesia Procedure Notes (Signed)
Procedure Name: Intubation Date/Time: 03/23/2014 12:32 AM Performed by: Luster LandsbergHASE, Chaim Gatley R Pre-anesthesia Checklist: Patient identified, Emergency Drugs available, Suction available and Patient being monitored Patient Re-evaluated:Patient Re-evaluated prior to inductionOxygen Delivery Method: Circle system utilized Preoxygenation: Pre-oxygenation with 100% oxygen Intubation Type: IV induction, Rapid sequence and Cricoid Pressure applied Laryngoscope Size: Mac and 3 Grade View: Grade I Tube type: Subglottic suction tube Tube size: 7.0 mm Number of attempts: 1 Airway Equipment and Method: Stylet Placement Confirmation: ETT inserted through vocal cords under direct vision

## 2014-03-23 NOTE — Procedures (Signed)
Central Venous Catheter Insertion Procedure Note Maria LefevreVera [name] [id number] [birth year]  Procedure: Insertion of Central Venous Catheter Indications: Assessment of intravascular volume, Drug and/or fluid administration and Frequent blood sampling  Procedure Details Consent: Unable to obtain consent because of altered level of consciousness. Time Out: Verified patient identification, verified procedure, site/side was marked, verified correct patient position, special equipment/implants available, medications/allergies/relevent history reviewed, required imaging and test results available.  Performed  Maximum sterile technique was used including antiseptics, cap, gloves, gown, hand hygiene, mask and sheet. Skin prep: Chlorhexidine; local anesthetic administered A antimicrobial bonded/coated triple lumen catheter was placed in the left internal jugular vein using the Seldinger technique.  Evaluation Blood flow good Complications: No apparent complications Patient did tolerate procedure well. Chest X-ray ordered to verify placement.  CXR: pending.  New CVL inserted over guidewire that was passed through old CVL (old one was not appropriately positioned for TPN use).  CXR pending.   [name], [place] - [facility] Pgr: [phone]  or [phone] 03/23/2014, 7:58 PM   Maria Fischeravid Simonds, MD ; [facility] service Mobile [phone].  After 5:30 PM or weekends, call [phone]

## 2014-03-23 NOTE — Procedures (Signed)
Hemodialysis Catheter Insertion Procedure Note Maria LefevreVera M [name] [id number] 08/24/1932  Procedure: Insertion of HD Catheter Indications: CRRT  Procedure Details Consent: Risks of procedure as well as the alternatives and risks of each were explained to the (patient/caregiver).  Consent for procedure obtained. Time Out: Verified patient identification, verified procedure, site/side was marked, verified correct patient position, special equipment/implants available, medications/allergies/relevent history reviewed, required imaging and test results available.  Performed  Maximum sterile technique was used including antiseptics, cap, gloves, gown, hand hygiene, mask and sheet. Skin prep: Chlorhexidine; local anesthetic administered A antimicrobial bonded/coated triple lumen catheter was placed in the right femoral vein due to patient being a dialysis patient using the Seldinger technique.  Evaluation Blood flow good Complications: No apparent complications Patient did tolerate procedure well. Chest X-ray ordered to verify placement.  CXR not needed.  Procedure performed under direct ultrasound guidance for real time vessel cannulation.      Rutherford Guysahul Desai, [place] - [facility] Pgr: [phone]  or [phone] 03/23/2014, 11:54 AM   I was present for and supervised the entire procedure  Billy Fischeravid Simonds, MD ; [facility] Mobile [phone].  After 5:30 PM or weekends, call [phone]

## 2014-03-23 NOTE — Care Management Note (Addendum)
    Page 1 of 2   04/08/2014     11:39:34 AM CARE MANAGEMENT NOTE 04/08/2014  Patient:  [name],[name]   Account Number:  [id number]  Date Initiated:  03/23/2014  Documentation initiated by:  [name],[name]  Subjective/Objective Assessment:   adm w pneumoperitoneum-vent     Action/Plan:   lives at home, has home o2 and hhc   Anticipated DC Date:  04/09/2014   [facility]  In-house referral  Clinical Social Worker      DC Planning Services  CM consult      [facility] Choice  Resumption Of Svcs/PTA [name]   Choice offered to / List presented to:          [facility]  [facility] RN  HH-4 NURSE'S AIDE      HH agency  [facility]   Status of service:  Completed, signed off Medicare Important Message given?  YES (If response is "NO", the following Medicare IM given date fields will be blank) Date Medicare IM given:  03/25/2014 Medicare IM given by:  [name],[name] Date Additional Medicare IM given:  04/01/2014 Additional Medicare IM given by:  Donn PieriniKRISTI WEBSTER  Discharge Disposition:  EXPIRED  Per UR Regulation:  Reviewed for med. necessity/level of care/duration of stay  If discussed at Long Length of Stay Meetings, dates discussed:   03/29/2014  04/05/2014  04/17/2014    Comments:  04/08/14 [name] Pt expired.     04/16/2014- 1100- [name], [name] [phone] Pt condition continues to worsen- [facility] should be comfort care focused- and MD is trying to reach family to come to establish GOC- TNA has been stopped- and per renal pt at this time is no longer appriopriate for HD. NCM to follow for further needs- pt will most likely need hospice home placement. CSW aware.  04/04/14 Laverna PeaceKristin McNally RN referral rec'd for LTAC. Call made to both [facility] & [facility] for review for approproiate. Palliative care continues to work with pt & family to discuss continue HD for this week vs comfort. Nurse CM will continue to follow if  appropriate for LTAC vs. comfort/palliative with a plan for either snf vs hospice??   03/29/14 1426 [name] RN MSN [name] CCM [facility] provides 36 hrs/wk of aide services, lives alone. Pt will need SNF for rehab before returning home.  10/21 0955 debbie dowell rn,bsn pt act [facility]. alerted [facility] of adm. left [facility] vac form on shadow chart in case needed. will cont to follow.

## 2014-03-23 NOTE — Progress Notes (Signed)
PARENTERAL NUTRITION CONSULT NOTE - INITIAL  Pharmacy Consult for TPN Indication: pneumoperitoneum with bowel resection  Allergies  Allergen Reactions  . Ivp Dye [Iodinated Diagnostic Agents] Swelling  . Lisinopril Swelling  . Iohexol Itching and Swelling         Patient Measurements: Height: 5\' 3"  (160 cm) Weight: 107 lb 9.4 oz (48.8 kg) IBW/kg (Calculated) : 52.4  Vital Signs: Temp: 96.7 F (35.9 C) (10/21 0421) Temp Source: Rectal (10/21 0421) BP: 78/40 mmHg (10/21 1000) Pulse Rate: 75 (10/21 0941) Intake/Output from previous day: 10/20 0701 - 10/21 0700 In: 3187.5 [I.V.:2637.5; IV Piggyback:550] Out: 660 [Urine:10; Emesis/NG output:100; Drains:450; Blood:100] Intake/Output from this shift: Total I/O In: 142.5 [I.V.:142.5] Out: -   Labs:  Recent Labs  Jun 02, 2014 2257 03/23/14 0106 03/23/14 0448  WBC  --   --  2.6*  HGB  --  13.9 13.4  HCT  --  41.0 43.2  PLT  --   --  110*  APTT 25  --   --   INR 1.20  --  1.34     Recent Labs  03/23/14 0106 03/23/14 0448 03/23/14 0500  NA 139 143  --   K 3.9 4.5  --   CL  --  104  --   CO2  --  19  --   GLUCOSE  --  127*  --   BUN  --  73*  --   CREATININE  --  5.30*  --   CALCIUM  --  7.3*  --   MG  --  1.5  --   PHOS  --   --  4.7*  PROT  --  4.8*  --   ALBUMIN  --  2.6*  --   AST  --  19  --   ALT  --  15  --   ALKPHOS  --  96  --   BILITOT  --  0.5  --    Estimated Creatinine Clearance: 6.6 ml/min (by C-G formula based on Cr of 5.3).    Recent Labs  03/23/14 0219 03/23/14 0409 03/23/14 0839  GLUCAP 138* 128* 89    Medical History: Past Medical History  Diagnosis Date  . Carotid artery occlusion     Bilateral carotid artery disease  . Peripheral artery disease   . CHF (congestive heart failure)     EF 15-20% on TEE 12/15/13  . Hypertension   . Hyperlipidemia   . ESRD on hemodialysis 06/20/11    M, W, F; [address], [place].  Started HD in Sept 2008  . AAA (abdominal aortic aneurysm)    . Stroke 2007  . Duodenal ulcer 2008  . S/P CABG (coronary artery bypass graft)     CABG in 2008, had 3 MI's prior  . Depression   . Pneumonia   . COPD, severe   . Hypothyroidism   . Anemia   . Pacemaker   . Atrial fibrillation   . Complete heart block     Insulin Requirements in the past 24 hours:  none  Current Nutrition:  NPO  Nutritional Goals:  1600 kCal, 74 grams of protein per day estimated- will follow for RD recommendations (consult placed)  Admit: [age] YOF admitted 10/20 after transfer from [place] with perforated bowel- CT showed free air and thickened small bowel. She was taken to OR 10/20 PM for ex lap, application of open wound VAC and sigmoid colon resection.   GI: hx duodenal ulcers; POD#1 ex lap,  sigmoid colon resection. Surgery planning to take back to OR tomorrow for re-exploration along with possible resection and colostomy. Prealbumin will be drawn tomorrow. On IV PPI  Endo: hx hypothyroidism, TSH 1.6 on admit, continued on home Synthroid . No noted history of DM, no A1C on file. CBGs 89-138  Lytes: Na 143, K 4.5, Mag 1.5, Phos 4.7, CorCa 8.2>> ca x phos = 38  Renal: ESRD on HD MWF- last HD was 10/19. Renal has been consulted, awaiting to see if we will continue HD or start CRRT  Pulm: remains ventilated post-op  Cards: CAD s/p CABG in 2008, HF with EF 25-30%, afib. Currently requiring phenylephrine 630mcg/min- BP remains soft, HR ok  Hepatobil: baseline LFTs all WNL. Albumin low at 2.6. No recent trigs on file  Neuro: wake-up assessment this morning with GCS 12, RASS -1. fent drip ordered. Currently awake  ID: Started empirically on Zosyn/vancomycin for bowel perf. Blood and urine cultures have been sent. WBC 2.6, currently afebrile  Best Practices:   TPN Access: CVC double lumen left IJ placed 10/20 TPN day#: 0 (starting 10/21)  Plan:  1. Start Clinimix 5/15 NO LYTES at 4030mL/hr with IVFE at 5310mL/hr- this will provide 36g protein and 991kcal in  24 hours 2. Multivitamin and trace elements in TPN 3. Start SSI and CBGs q4h 4. At 1800 tonight, change MIVF to 1/2NS at 6040mL/hr 5. TPN labs as ordered 6. Will follow for dialysis plans and RD recommendations and adjust TPN accordingly   Edvin Albus D. Neria Procter, PharmD, BCPS Clinical Pharmacist Pager: [phone] 03/23/2014 11:00 AM

## 2014-03-23 NOTE — Progress Notes (Signed)
INITIAL NUTRITION ASSESSMENT  DOCUMENTATION CODES Per approved criteria  -Not Applicable   INTERVENTION:  TPN per Pharmacy to meet protein and calorie needs as able.  NUTRITION DIAGNOSIS: Inadequate oral intake related to inability to eat as evidenced by NPO status.   Goal: Intake to meet >90% of estimated nutrition needs.  Monitor:  TPN tolerance/adequacy, weight trend, labs, vent status.  Reason for Assessment: New TPN consult  80 y.o. female  Admitting Dx: Pneumoperitoneum; bowel perforation  ASSESSMENT: 80 y.o. female with history of CAD s/p CABG, systolic CHF, COPD, tobacco use, ESRD presenting with pneumoperitoneum.   S/P exploratory laparotomy, sigmoid colon resection, and application of wound VAC for sigmoid perforation with intestinal ischemia on 10/20. Surgery planning to take back to OR tomorrow for re-exploration along with possible resection and colostomy. TPN being initiated today. Renal consult pending. If CRRT started, will need increased calories and protein.  Nutrition Focused Physical Exam:  Subcutaneous Fat:  Orbital Region: WNL Upper Arm Region: NA Thoracic and Lumbar Region: NA  Muscle:  Temple Region: mild depletion Clavicle Bone Region: WNL Clavicle and Acromion Bone Region: WNL Scapular Bone Region: NA Dorsal Hand: NA Patellar Region: moderate depletion Anterior Thigh Region: moderate depletion Posterior Calf Region: NA  Edema: none   Patient is currently intubated on ventilator support MV: 8.3 L/min Temp (24hrs), Avg:97.2 F (36.2 C), Min:96.7 F (35.9 C), Max:97.5 F (36.4 C)   Height: Ht Readings from Last 1 Encounters:  03/23/14 5\' 3"  (1.6 m)    Weight: Wt Readings from Last 1 Encounters:  03/23/14 107 lb 9.4 oz (48.8 kg)    Ideal Body Weight: 52.3 kg  % Ideal Body Weight: 93%  Wt Readings from Last 10 Encounters:  03/23/14 107 lb 9.4 oz (48.8 kg)  03/23/14 107 lb 9.4 oz (48.8 kg)  03/19/14 101 lb 13.6 oz (46.2  kg)  12/03/13 106 lb 0.7 oz (48.1 kg)  12/03/13 106 lb 0.7 oz (48.1 kg)  11/15/13 97 lb (43.999 kg)  06/11/13 108 lb (48.988 kg)  06/07/13 118 lb 6.2 oz (53.7 kg)  07/06/12 107 lb 9.4 oz (48.8 kg)  07/06/12 107 lb 9.4 oz (48.8 kg)    Usual Body Weight: 97 lb (4 months ago)  % Usual Body Weight: 110%  BMI:  Body mass index is 19.06 kg/(m^2).  Estimated Nutritional Needs: Kcal: 1024 Protein: 65-80 gm Fluid: 1.2 L  Skin: surgical incision to abdomen with open wound VAC  Diet Order: NPO  EDUCATION NEEDS: -Education not appropriate at this time   Intake/Output Summary (Last 24 hours) at 03/23/14 1151 Last data filed at 03/23/14 1000  Gross per 24 hour  Intake 3330.01 ml  Output    660 ml  Net 2670.01 ml    Last BM: PTA   Labs:   Recent Labs Lab 03/18/14 0500 03/19/14 0639 03/23/14 0106 03/23/14 0448 03/23/14 0500  NA 139 134* 139 143  --   K 4.9 5.5* 3.9 4.5  --   CL 97 94*  --  104  --   CO2 29 24  --  19  --   BUN 45* 78*  --  73*  --   CREATININE 4.80* 5.86*  --  5.30*  --   CALCIUM 8.1* 8.2*  --  7.3*  --   MG  --   --   --  1.5  --   PHOS 5.4* 4.3  --   --  4.7*  GLUCOSE 119* 129*  --  127*  --  CBG (last 3)   Recent Labs  03/23/14 0219 03/23/14 0409 03/23/14 0839  GLUCAP 138* 128* 89    Scheduled Meds: . antiseptic oral rinse  7 mL Mouth Rinse q12n4p  . arformoterol  15 mcg Nebulization BID  . budesonide (PULMICORT) nebulizer solution  0.5 mg Nebulization BID  . chlorhexidine  15 mL Mouth Rinse BID  . heparin subcutaneous  5,000 Units Subcutaneous 3 times per day  . insulin aspart  0-9 Units Subcutaneous 6 times per day  . ipratropium  0.5 mg Nebulization Q6H  . levothyroxine  150 mcg Oral QAC breakfast  . olopatadine  1 drop Both Eyes BID  . ondansetron (ZOFRAN) IV  4 mg Intravenous 3 times per day  . pantoprazole (PROTONIX) IV  40 mg Intravenous Daily  . piperacillin-tazobactam (ZOSYN)  IV  2.25 g Intravenous 3 times per day  .  sodium chloride  3 mL Intravenous Q12H  . sodium chloride  3 mL Intravenous Q12H    Continuous Infusions: . sodium chloride    . dextrose 5 % and 0.45% NaCl    . TPN (CLINIMIX) Adult without lytes     And  . fat emulsion    . fentaNYL infusion INTRAVENOUS 100 mcg/hr (03/23/14 0800)  . norepinephrine (LEVOPHED) Adult infusion    . phenylephrine (NEO-SYNEPHRINE) Adult infusion 75 mcg/min (03/23/14 1012)    Past Medical History  Diagnosis Date  . Carotid artery occlusion     Bilateral carotid artery disease  . Peripheral artery disease   . CHF (congestive heart failure)     EF 15-20% on TEE 12/15/13  . Hypertension   . Hyperlipidemia   . ESRD on hemodialysis 06/20/11    M[address] W, F; [address], [place].  Started HD in Sept 2008  . AAA (abdominal aortic aneurysm)   . Stroke 2007  . Duodenal ulcer 2008  . S/P CABG (coronary artery bypass graft)     CABG in 2008, had 3 MI's prior  . Depression   . Pneumonia   . COPD, severe   . Hypothyroidism   . Anemia   . Pacemaker   . Atrial fibrillation   . Complete heart block     Past Surgical History  Procedure Laterality Date  . Thyroid surgery  2004  . Dg av dialysis  shunt access exist*l* or  03/2000    left upper arm  . Vaginal hysterectomy  1976  . Hemorrhoid surgery  1971  . Insert / replace / remove pacemaker  1990's    initial placement  . Insert / replace / remove pacemaker  05/2011    MDT ADDRL1 pacemaker implanted by Dr Dulce SellarMunley  . Incisional hernia repair  10/2010    incarcerated  . Common iliac  06/2009    bilateral kissing stents  . Pseudoaneurysm repair  09/1999    right groin  . Av fistula repair  04/2007    left upper arm  . Cardiac catheterization  02/10, 03/12    Most recent showed 3 vessel CAD with patent grafts: SVG to LAD, SVG to PDA and SVG to OM  . Coronary angioplasty with stent placement  09/1999    "1"  . Coronary artery bypass graft  02/2007    CABG X3  . Tee without cardioversion N/A 12/02/2013     Procedure: TRANSESOPHAGEAL ECHOCARDIOGRAM (TEE);  Surgeon: Lewayne BuntingBrian S Crenshaw, MD;  [facility];  Service: Cardiovascular;  Laterality: N/A;    Joaquin CourtsKimberly Harris, RD, LDN, CNSC Pager [phone]  After Hours Pager [phone]

## 2014-03-23 NOTE — Progress Notes (Signed)
1 Day Post-Op  Subjective: On pressors, ventilated, but awake  Objective: Vital signs in last 24 hours: Temp:  [96.7 F (35.9 C)-97.5 F (36.4 C)] 96.7 F (35.9 C) (10/21 0421) Pulse Rate:  [72-97] 74 (10/21 0215) Resp:  [15-30] 17 (10/21 0700) BP: (85-177)/(42-66) 102/52 mmHg (10/21 0700) SpO2:  [96 %-100 %] 100 % (10/21 0400) Arterial Line BP: (100-152)/(38-55) 127/45 mmHg (10/21 0700) FiO2 (%):  [50 %-100 %] 50 % (10/21 0400) Weight:  [100 lb (45.36 kg)-107 lb 9.4 oz (48.8 kg)] 107 lb 9.4 oz (48.8 kg) (10/21 0222) Last BM Date:  (pts)  Intake/Output from previous day: 10/20 0701 - 10/21 0700 In: 3187.5 [I.V.:2637.5; IV Piggyback:550] Out: 660 [Urine:10; Emesis/NG output:100; Drains:450; Blood:100] Intake/Output this shift:    Abdomen firm, wound VAC in place  Lab Results:   Recent Labs  03/23/14 0106 03/23/14 0448  WBC  --  2.6*  HGB 13.9 13.4  HCT 41.0 43.2  PLT  --  110*   BMET  Recent Labs  03/23/14 0106 03/23/14 0448  NA 139 143  K 3.9 4.5  CL  --  104  CO2  --  19  GLUCOSE  --  127*  BUN  --  73*  CREATININE  --  5.30*  CALCIUM  --  7.3*   PT/INR  Recent Labs  10-May-2014 2257 03/23/14 0448  LABPROT 15.3* 16.7*  INR 1.20 1.34   ABG  Recent Labs  03/23/14 0130 03/23/14 0700  PHART 7.299* 7.344*  HCO3 21.6 21.4    Studies/Results: Dg Chest Port 1 View  03/23/2014   CLINICAL DATA:  Evaluate endotracheal tube placement  EXAM: PORTABLE CHEST - 1 VIEW  COMPARISON:  Chest x-ray of 03/15/2014  FINDINGS: The tip of the endotracheal tube is approximately 2.9 cm above the carina. Cardiomegaly and minimal pulmonary vascular congestion remains. Left central venous line is present with the tip seen to the junction of the left innominate vein with the SVC. Pacemaker remains.  IMPRESSION: 1. Endotracheal tube 2.9 cm above the carina. 2. Left IJ central venous line tip near the junction of the left innominate vein and SVC. 3. Stable cardiomegaly with  minimal congestion endotracheal tube placement   Electronically Signed   By: Dwyane DeePaul  Barry M.D.   On: 03/23/2014 07:52   Dg Abd Portable 1v  03/23/2014   CLINICAL DATA:  Encounter for nasogastric tube placement  EXAM: PORTABLE ABDOMEN - 1 VIEW  COMPARISON:  CT abdomen pelvis of June 04, 2013  FINDINGS: The tip of the feeding tube is below the hemidiaphragm probably overlying the mid portion of the stomach. The bowel gas pattern is nonspecific. Considerable arterial calcification is present throughout the abdominal aorta and iliac arteries.  IMPRESSION: Tip of NG tube overlies the region of the body of the stomach.   Electronically Signed   By: Dwyane DeePaul  Barry M.D.   On: 03/23/2014 07:49    Anti-infectives: Anti-infectives   Start     Dose/Rate Route Frequency Ordered Stop   03/23/14 0500  piperacillin-tazobactam (ZOSYN) IVPB 2.25 g     2.25 g 100 mL/hr over 30 Minutes Intravenous 3 times per day 10-May-2014 2242     10-May-2014 2245  [MAR Hold]  vancomycin (VANCOCIN) IVPB 750 mg/150 ml premix     (On MAR Hold since 03/23/14 0020)   750 mg 150 mL/hr over 60 Minutes Intravenous  Once 10-May-2014 2241 03/23/14 0024      Assessment/Plan: s/p Procedure(s): EXPLORATORY LAPAROTOMY (N/A) APPLICATION OF OPEN WOUND VAC (  N/A) COLON RESECTION SIGMOID (N/A)  Plan to return to the OR tomorrow for re-exploration, possible bowel resection and colostomy  LOS: 1 day    Abdulaziz Toman A 03/23/2014

## 2014-03-23 NOTE — Anesthesia Postprocedure Evaluation (Signed)
  Anesthesia Post-op Note  Patient: [name]  Procedure(s) Performed: Procedure(s): EXPLORATORY LAPAROTOMY (N/A) APPLICATION OF OPEN WOUND VAC (N/A) COLON RESECTION SIGMOID (N/A)  Patient Location: SICU  Anesthesia Type:General  Level of Consciousness: sedated and Patient remains intubated per anesthesia plan  Airway and Oxygen Therapy: Patient remains intubated per anesthesia plan and Patient placed on Ventilator (see vital sign flow sheet for setting)  Post-op Pain: none  Post-op Assessment: Post-op Vital signs reviewed, Patient's Cardiovascular Status Stable, Respiratory Function Stable, Patent Airway and No signs of Nausea or vomiting  Post-op Vital Signs: stable  Last Vitals:  Filed Vitals:   03/23/14 0600  BP: 85/42  Pulse:   Temp:   Resp: 19    Complications: No apparent anesthesia complications

## 2014-03-23 NOTE — Op Note (Signed)
03/21/2014 - 03/23/2014  2:13 AM  PATIENT:  [name]  80 y.o. female  PRE-OPERATIVE DIAGNOSIS:  bowel perforation Severe COPD ESRD on HD CAD/PAD CHF HTN Tobacco use  POST-OPERATIVE DIAGNOSIS:  Sigmoid perforation with intestinal ischemia. Severe COPD ESRD on HD CAD/PAD CHF HTN Tobacco use  PROCEDURE:  Procedure(s): EXPLORATORY LAPAROTOMY APPLICATION OF OPEN WOUND VAC (ABTHERA) COLON RESECTION SIGMOID  SURGEON:  Surgeon(s): Atilano InaEric M Murlin Schrieber, MD  ASSISTANTS: none   ANESTHESIA:   general  DRAINS: Nasogastric Tube and Urinary Catheter (Foley)   LOCAL MEDICATIONS USED:  NONE  SPECIMEN:  Source of Specimen:  sigmoid colon - stitch proximal  DISPOSITION OF SPECIMEN:  PATHOLOGY  COUNTS:  YES  INDICATION FOR PROCEDURE: 80 year old Caucasian female with end-stage renal disease on hemodialysis, severe COPD, coronary artery disease, congestive heart failure (ejection fraction 15%), tobacco use, hypertension presented to outside hospital with worsening abdominal pain. CT imaging at that facility demonstrated pneumoperitoneum concerning for bowel perforation. She was transferred to our institution for further evaluation and management. In the emergency room should she was found to have peritonitis. I reviewed her CT scan which demonstrated pneumoperitoneum with severe small bowel wall thickening and fluid throughout her abdomen. She has numerous significant medical comorbidities. I had a prolonged conversation with her extended family regarding management options. We discussed observation with pallida care and antibiotic treatment versus surgical exploration. We discussed the risk and benefits of each. We discussed the anticipated outcome with each as well. Even with surgery as I explained to the family she was at very high risk for perioperative death, need for additional procedures, ventilator dependence, hernia, ongoing intestinal ischemia, pneumonia, potential need for bowel  resection, possible ostomy, UTI, blood clot formation. We discussed the likelihood of her being able to return home and live independently with some assistance would be less than 10%. After much discussion the family elected for her to proceed to the operating room for exploratory surgery with possible bowel resection possible ostomy. The patient also elected for surgery.  PROCEDURE: After obtaining informed consent, the patient was taken emergently to operating room t 2. She was placed supine on the operating room table. General endotracheal anesthesia was established. Anesthesia placed a left internal jugular central line as well as a right brachial a line. Foley catheter was placed. Her abdomen was prepped and draped in the usual standard surgical fashion. She received broad-spectrum IV antibiotics prior to skin incision. A surgical timeout was performed. Initially I made a mini midline incision with a #10 blade and carried down to her subcutaneous tissue which was very thin. Her fascia is incised and the abdominal cavity was entered. There initially was cloudy fluid that drained but then when I lifted up on her lower midline there is frank stool. It appeared the source of her problems in her lower abdomen. I extended the anterior portion of the midline incision down toward her pubic bone. A Balfour retractor was placed. There is gross stool in her abdomen. There is probably about 300 cc of stool in her abdomen. The small bowel was eviscerated. I identified a focal area of ischemic and necrotic sigmoid colon that had perforated. The remaining part of her small intestine had focal areas of patchy necrosis. The proximal probably 30 cm of her jejunum was healthy. After that she started having patchy areas of ischemia. None of that was full thickness or entirely circumferential. It extended down toward her ileum. The right colon appeared viable. The transverse colon along the tenia  coli had patchy areas of ischemia  that can best be described as scattered tiny foci of black dots. At this point I divided the distal sigmoid colon about it 2 inches below the area of perforation with the contour stapler with a green load. I then also divided it several inches above the area of perforation with another fire of the green load stapler. I then took down the mesentery in a serial fashion using the LigaSure device staying close to the bowel wall. This freed the specimen. It was labeled as sigmoid colon with stitch marking proximal margin. She has a very long rectal stump. There is evidence of diverticular disease in her proximal sigmoid and in her upper rectal stump. We then copiously irrigated her abdomen with saline. I reinspected her small bowel and it hadn't really changed that much. I felt that she would need a second look operation to determine the viability of her small intestine. It was nonischemic enough this evening for me to resect it or to simply close. Because we will potentially be bringing her back in 36-48 hours if she survives I did not deliver or bring out an end colostomy. I did take down some the lateral attachments so that it would be mobile enough. I marked the rectal stump with a silk suture. I also marked the end of the proximal colon staple line with the silk sutures well. An open abdominal wound VAC system (Abthera) was placed in typical fashion. We had an adequate seal. There is no evidence of leak. Prior to placing the wound Vac I confirmed placement of the nasogastric tube in the stomach. All needle, instrument, and sponge counts were correct x2. The patient was left intubated and transported to the intensive care unit in critical condition.  PLAN OF CARE: ICU  PATIENT DISPOSITION:  ICU - intubated and critically ill.   Delay start of Pharmacological VTE agent (>24hrs) due to surgical blood loss or risk of bleeding:  no  Mary SellaEric M. Andrey CampanileWilson, MD, FACS General, Bariatric, & Minimally Invasive  Sur[facility], [place]

## 2014-03-24 ENCOUNTER — Encounter (HOSPITAL_COMMUNITY): Payer: Self-pay | Admitting: Anesthesiology

## 2014-03-24 ENCOUNTER — Encounter (HOSPITAL_COMMUNITY): Payer: Medicare Other | Admitting: Certified Registered Nurse Anesthetist

## 2014-03-24 ENCOUNTER — Inpatient Hospital Stay (HOSPITAL_COMMUNITY): Payer: Medicare Other | Admitting: Certified Registered Nurse Anesthetist

## 2014-03-24 ENCOUNTER — Encounter (HOSPITAL_COMMUNITY): Admission: EM | Disposition: E | Payer: Self-pay | Source: Home / Self Care | Attending: Internal Medicine

## 2014-03-24 DIAGNOSIS — J9601 Acute respiratory failure with hypoxia: Secondary | ICD-10-CM

## 2014-03-24 HISTORY — PX: COLOSTOMY: SHX63

## 2014-03-24 HISTORY — PX: LAPAROTOMY: SHX154

## 2014-03-24 LAB — RENAL FUNCTION PANEL
Albumin: 2.2 g/dL — ABNORMAL LOW (ref 3.5–5.2)
Anion gap: 13 (ref 5–15)
BUN: 31 mg/dL — ABNORMAL HIGH (ref 6–23)
CHLORIDE: 101 meq/L (ref 96–112)
CO2: 21 meq/L (ref 19–32)
CREATININE: 2.2 mg/dL — AB (ref 0.50–1.10)
Calcium: 7.2 mg/dL — ABNORMAL LOW (ref 8.4–10.5)
GFR, EST AFRICAN AMERICAN: 23 mL/min — AB (ref >90)
GFR, EST NON AFRICAN AMERICAN: 20 mL/min — AB (ref >90)
Glucose, Bld: 105 mg/dL — ABNORMAL HIGH (ref 70–99)
Phosphorus: 3.4 mg/dL (ref 2.3–4.6)
Potassium: 4.7 mEq/L (ref 3.7–5.3)
Sodium: 135 mEq/L — ABNORMAL LOW (ref 137–147)

## 2014-03-24 LAB — GLUCOSE, CAPILLARY
GLUCOSE-CAPILLARY: 84 mg/dL (ref 70–99)
Glucose-Capillary: 104 mg/dL — ABNORMAL HIGH (ref 70–99)
Glucose-Capillary: 131 mg/dL — ABNORMAL HIGH (ref 70–99)
Glucose-Capillary: 141 mg/dL — ABNORMAL HIGH (ref 70–99)
Glucose-Capillary: 82 mg/dL (ref 70–99)
Glucose-Capillary: 85 mg/dL (ref 70–99)

## 2014-03-24 LAB — COMPREHENSIVE METABOLIC PANEL
ALBUMIN: 2.2 g/dL — AB (ref 3.5–5.2)
ALK PHOS: 97 U/L (ref 39–117)
ALT: 17 U/L (ref 0–35)
AST: 31 U/L (ref 0–37)
Anion gap: 16 — ABNORMAL HIGH (ref 5–15)
BILIRUBIN TOTAL: 0.4 mg/dL (ref 0.3–1.2)
BUN: 43 mg/dL — ABNORMAL HIGH (ref 6–23)
CHLORIDE: 101 meq/L (ref 96–112)
CO2: 22 mEq/L (ref 19–32)
CREATININE: 3.25 mg/dL — AB (ref 0.50–1.10)
Calcium: 7.3 mg/dL — ABNORMAL LOW (ref 8.4–10.5)
GFR calc Af Amer: 15 mL/min — ABNORMAL LOW (ref >90)
GFR calc non Af Amer: 13 mL/min — ABNORMAL LOW (ref >90)
Glucose, Bld: 144 mg/dL — ABNORMAL HIGH (ref 70–99)
POTASSIUM: 5.1 meq/L (ref 3.7–5.3)
Sodium: 139 mEq/L (ref 137–147)
Total Protein: 5.1 g/dL — ABNORMAL LOW (ref 6.0–8.3)

## 2014-03-24 LAB — PREALBUMIN: PREALBUMIN: 14.1 mg/dL — AB (ref 17.0–34.0)

## 2014-03-24 LAB — URINE CULTURE
CULTURE: NO GROWTH
Colony Count: NO GROWTH

## 2014-03-24 LAB — DIFFERENTIAL
Basophils Absolute: 0 10*3/uL (ref 0.0–0.1)
Basophils Relative: 0 % (ref 0–1)
Eosinophils Absolute: 0 10*3/uL (ref 0.0–0.7)
Eosinophils Relative: 0 % (ref 0–5)
LYMPHS PCT: 2 % — AB (ref 12–46)
Lymphs Abs: 0.4 10*3/uL — ABNORMAL LOW (ref 0.7–4.0)
MONOS PCT: 3 % (ref 3–12)
Monocytes Absolute: 0.6 10*3/uL (ref 0.1–1.0)
Neutro Abs: 19.3 10*3/uL — ABNORMAL HIGH (ref 1.7–7.7)
Neutrophils Relative %: 95 % — ABNORMAL HIGH (ref 43–77)
WBC MORPHOLOGY: INCREASED

## 2014-03-24 LAB — PHOSPHORUS: Phosphorus: 4.6 mg/dL (ref 2.3–4.6)

## 2014-03-24 LAB — CBC
HCT: 42.8 % (ref 36.0–46.0)
Hemoglobin: 13.7 g/dL (ref 12.0–15.0)
MCH: 32.9 pg (ref 26.0–34.0)
MCHC: 32 g/dL (ref 30.0–36.0)
MCV: 102.6 fL — ABNORMAL HIGH (ref 78.0–100.0)
PLATELETS: 70 10*3/uL — AB (ref 150–400)
RBC: 4.17 MIL/uL (ref 3.87–5.11)
RDW: 17.1 % — AB (ref 11.5–15.5)
WBC: 20.3 10*3/uL — AB (ref 4.0–10.5)

## 2014-03-24 LAB — MAGNESIUM: MAGNESIUM: 1.8 mg/dL (ref 1.5–2.5)

## 2014-03-24 LAB — APTT: aPTT: 33 seconds (ref 24–37)

## 2014-03-24 LAB — TRIGLYCERIDES: TRIGLYCERIDES: 199 mg/dL — AB (ref <150)

## 2014-03-24 SURGERY — LAPAROTOMY, EXPLORATORY
Anesthesia: General | Site: Abdomen

## 2014-03-24 MED ORDER — SUCCINYLCHOLINE CHLORIDE 20 MG/ML IJ SOLN
INTRAMUSCULAR | Status: AC
  Filled 2014-03-24: qty 1

## 2014-03-24 MED ORDER — PHENYLEPHRINE HCL 10 MG/ML IJ SOLN
INTRAMUSCULAR | Status: DC | PRN
  Administered 2014-03-24 (×5): 80 ug via INTRAVENOUS

## 2014-03-24 MED ORDER — CALCIUM CHLORIDE 10 % IV SOLN
INTRAVENOUS | Status: AC
  Filled 2014-03-24: qty 10

## 2014-03-24 MED ORDER — SODIUM CHLORIDE 0.9 % IV SOLN
INTRAVENOUS | Status: DC | PRN
  Administered 2014-03-24: 09:00:00 via INTRAVENOUS

## 2014-03-24 MED ORDER — TRACE MINERALS CR-CU-F-FE-I-MN-MO-SE-ZN IV SOLN
INTRAVENOUS | Status: AC
  Administered 2014-03-24: 18:00:00 via INTRAVENOUS
  Filled 2014-03-24: qty 1000

## 2014-03-24 MED ORDER — ALBUMIN HUMAN 5 % IV SOLN
INTRAVENOUS | Status: DC | PRN
  Administered 2014-03-24: 10:00:00 via INTRAVENOUS

## 2014-03-24 MED ORDER — 0.9 % SODIUM CHLORIDE (POUR BTL) OPTIME
TOPICAL | Status: DC | PRN
  Administered 2014-03-24 (×3): 1000 mL

## 2014-03-24 MED ORDER — PHENYLEPHRINE 40 MCG/ML (10ML) SYRINGE FOR IV PUSH (FOR BLOOD PRESSURE SUPPORT)
PREFILLED_SYRINGE | INTRAVENOUS | Status: AC
  Filled 2014-03-24: qty 20

## 2014-03-24 MED ORDER — PROPOFOL 10 MG/ML IV BOLUS
INTRAVENOUS | Status: AC
  Filled 2014-03-24: qty 20

## 2014-03-24 MED ORDER — CALCIUM CHLORIDE 10 % IV SOLN
INTRAVENOUS | Status: DC | PRN
  Administered 2014-03-24: 0.5 g via INTRAVENOUS

## 2014-03-24 MED ORDER — ROCURONIUM BROMIDE 100 MG/10ML IV SOLN
INTRAVENOUS | Status: DC | PRN
  Administered 2014-03-24: 25 mg via INTRAVENOUS

## 2014-03-24 SURGICAL SUPPLY — 47 items
BLADE SURG ROTATE 9660 (MISCELLANEOUS) IMPLANT
CANISTER SUCTION 2500CC (MISCELLANEOUS) ×3 IMPLANT
CONT SPEC 4OZ CLIKSEAL STRL BL (MISCELLANEOUS) ×3 IMPLANT
COVER MAYO STAND STRL (DRAPES) IMPLANT
COVER SURGICAL LIGHT HANDLE (MISCELLANEOUS) ×3 IMPLANT
DRAPE LAPAROSCOPIC ABDOMINAL (DRAPES) ×3 IMPLANT
DRAPE PROXIMA HALF (DRAPES) IMPLANT
DRAPE UTILITY 15X26 W/TAPE STR (DRAPE) ×6 IMPLANT
DRAPE WARM FLUID 44X44 (DRAPE) ×3 IMPLANT
DRSG OPSITE POSTOP 4X10 (GAUZE/BANDAGES/DRESSINGS) IMPLANT
DRSG OPSITE POSTOP 4X8 (GAUZE/BANDAGES/DRESSINGS) IMPLANT
ELECT BLADE 6.5 EXT (BLADE) IMPLANT
ELECT CAUTERY BLADE 6.4 (BLADE) ×3 IMPLANT
ELECT REM PT RETURN 9FT ADLT (ELECTROSURGICAL) ×3
ELECTRODE REM PT RTRN 9FT ADLT (ELECTROSURGICAL) ×1 IMPLANT
GAUZE SPONGE 4X4 16PLY XRAY LF (GAUZE/BANDAGES/DRESSINGS) ×3 IMPLANT
GLOVE SURG SIGNA 7.5 PF LTX (GLOVE) ×3 IMPLANT
GOWN STRL REUS W/ TWL LRG LVL3 (GOWN DISPOSABLE) ×2 IMPLANT
GOWN STRL REUS W/ TWL XL LVL3 (GOWN DISPOSABLE) ×1 IMPLANT
GOWN STRL REUS W/TWL LRG LVL3 (GOWN DISPOSABLE) ×4
GOWN STRL REUS W/TWL XL LVL3 (GOWN DISPOSABLE) ×2
KIT BASIN OR (CUSTOM PROCEDURE TRAY) ×3 IMPLANT
KIT OSTOMY DRAINABLE 2.75 STR (WOUND CARE) ×3 IMPLANT
KIT ROOM TURNOVER OR (KITS) ×3 IMPLANT
LIGASURE IMPACT 36 18CM CVD LR (INSTRUMENTS) IMPLANT
NS IRRIG 1000ML POUR BTL (IV SOLUTION) ×6 IMPLANT
PACK GENERAL/GYN (CUSTOM PROCEDURE TRAY) ×3 IMPLANT
PAD ABD 8X10 STRL (GAUZE/BANDAGES/DRESSINGS) ×3 IMPLANT
PAD ARMBOARD 7.5X6 YLW CONV (MISCELLANEOUS) ×3 IMPLANT
PENCIL BUTTON HOLSTER BLD 10FT (ELECTRODE) IMPLANT
SPECIMEN JAR LARGE (MISCELLANEOUS) IMPLANT
SPONGE LAP 18X18 X RAY DECT (DISPOSABLE) IMPLANT
STAPLER VISISTAT 35W (STAPLE) ×3 IMPLANT
SUCTION POOLE TIP (SUCTIONS) ×3 IMPLANT
SUT PDS AB 1 TP1 54 (SUTURE) ×6 IMPLANT
SUT PDS AB 1 TP1 96 (SUTURE) ×6 IMPLANT
SUT SILK 2 0 SH CR/8 (SUTURE) ×3 IMPLANT
SUT SILK 2 0 TIES 10X30 (SUTURE) ×3 IMPLANT
SUT SILK 3 0 SH CR/8 (SUTURE) ×3 IMPLANT
SUT SILK 3 0 TIES 10X30 (SUTURE) ×3 IMPLANT
SUT VIC AB 3-0 SH 18 (SUTURE) IMPLANT
TOWEL OR 17X24 6PK STRL BLUE (TOWEL DISPOSABLE) ×3 IMPLANT
TOWEL OR 17X26 10 PK STRL BLUE (TOWEL DISPOSABLE) ×3 IMPLANT
TRAY FOLEY CATH 16FRSI W/METER (SET/KITS/TRAYS/PACK) IMPLANT
TUBE CONNECTING 12'X1/4 (SUCTIONS)
TUBE CONNECTING 12X1/4 (SUCTIONS) IMPLANT
YANKAUER SUCT BULB TIP NO VENT (SUCTIONS) IMPLANT

## 2014-03-24 NOTE — Progress Notes (Signed)
[facility] Progress Note   Subjective: went to OR where most of bowel looked OK, some ischemic areas but not full thickness and no further bowel was resected.    Filed Vitals:   03/05/2014 1108 03/03/2014 1138 03/13/2014 1555 03/04/2014 1613  BP: 149/38   134/46  Pulse: 75   77  Temp:  97 F (36.1 C) 97.7 F (36.5 C)   TempSrc:  Oral Oral   Resp: 16   28  Height:      Weight:      SpO2: 100%   100%   Exam: Elderly adult female, opens eyes to voice, on vent No jvd  Chest clear BS bilat  RRR 3/6 SEM no rub, distant heart sounds  Abd large midline vac system in place, dec'd BS, nondistended  No LE or UE edema  Neuro is on vent, sedated, arouses to voice   HD: MWF [facility]   Assessment:  1 Ruptured sigmoid colon / ischemia bowel - s/p partial colectomy 2 Septic shock on pressors - slightly better 3 ESRD- on CRRT D #2 4 Hx CABG  5 Anemia Hb 13 6 Afib / hx CHB w PPM / hx CABG / CM EF 15-20%  7 Possibly malignant liver nodules -sent to lab    Plan- cont CRRT for now, keeping even, no hep   Vinson Moselleob Lanice Folden MD  pager [phone]    cell [phone]  03/14/2014, 4:23 PM     Recent Labs Lab 03/23/14 0448 03/23/14 0500 03/23/14 2320 03/12/2014 0358  NA 143  --  135* 139  K 4.5  --  4.9 5.1  CL 104  --  97 101  CO2 19  --  19 22  GLUCOSE 127*  --  159* 144*  BUN 73*  --  49* 43*  CREATININE 5.30*  --  3.62* 3.25*  CALCIUM 7.3*  --  7.3* 7.3*  PHOS  --  4.7* 4.8* 4.6    Recent Labs Lab 03/23/14 0448 03/23/14 2320 03/09/2014 0358  AST 19  --  31  ALT 15  --  17  ALKPHOS 96  --  97  BILITOT 0.5  --  0.4  PROT 4.8*  --  5.1*  ALBUMIN 2.6* 2.4* 2.2*    Recent Labs Lab 03/19/14 0639 03/23/14 0106 03/23/14 0448 03/30/2014 0358  WBC 5.3  --  2.6* 20.3*  NEUTROABS  --   --   --  19.3*  HGB 11.9* 13.9 13.4 13.7  HCT 38.1 41.0 43.2 42.8  MCV 104.1*  --  105.1* 102.6*  PLT 113*  --  110* 70*   . antiseptic oral rinse  7 mL Mouth Rinse QID  . arformoterol   15 mcg Nebulization BID  . budesonide (PULMICORT) nebulizer solution  0.5 mg Nebulization BID  . chlorhexidine  15 mL Mouth Rinse BID  . insulin aspart  0-9 Units Subcutaneous 6 times per day  . ipratropium  0.5 mg Nebulization Q6H  . levothyroxine  150 mcg Oral QAC breakfast  . olopatadine  1 drop Both Eyes BID  . ondansetron (ZOFRAN) IV  4 mg Intravenous 3 times per day  . pantoprazole (PROTONIX) IV  40 mg Intravenous Daily  . piperacillin-tazobactam (ZOSYN)  IV  2.25 g Intravenous 4 times per day  . sodium chloride  3 mL Intravenous Q12H  . sodium chloride  3 mL Intravenous Q12H  . vancomycin  500 mg Intravenous Q24H   . sodium chloride 500 mL (03/23/14 2217)  .  TPN (CLINIMIX) Adult without lytes 30 mL/hr at 03/23/14 2144   And  . fat emulsion 240 mL (03/23/14 2144)  . fentaNYL infusion INTRAVENOUS 50 mcg/hr (03/22/2014 1615)  . norepinephrine (LEVOPHED) Adult infusion 5 mcg/min (03/26/2014 1550)  . phenylephrine (NEO-SYNEPHRINE) Adult infusion Stopped (03/23/14 1310)  . dialysis replacement fluid (prismasate) 400 mL/hr at 03/23/14 1607  . dialysis replacement fluid (prismasate) 200 mL/hr at 03/23/14 1608  . dialysate (PRISMASATE) 1,500 mL/hr at 03/11/2014 1330  . TPN (CLINIMIX) Adult without lytes     sodium chloride, albuterol, fentaNYL, heparin, heparin, hydrALAZINE, midazolam, morphine injection, nitroGLYCERIN, pramoxine-mineral oil-zinc, sodium chloride

## 2014-03-24 NOTE — Anesthesia Preprocedure Evaluation (Signed)
Anesthesia Evaluation  Patient identified by MRN, date of birth, ID band Patient unresponsive    Reviewed: Unable to perform ROS - Chart review only  Airway       Dental   Pulmonary shortness of breath, COPDCurrent Smoker,          Cardiovascular hypertension, + CAD, + Peripheral Vascular Disease and +CHF + dysrhythmias + pacemaker + Cardiac Defibrillator     Neuro/Psych CVA    GI/Hepatic   Endo/Other  Hypothyroidism   Renal/GU Dialysis and ESRFRenal disease     Musculoskeletal   Abdominal   Peds  Hematology  (+) anemia ,   Anesthesia Other Findings   Reproductive/Obstetrics                           Anesthesia Physical Anesthesia Plan  ASA: IV  Anesthesia Plan: General   Post-op Pain Management:    Induction: Intravenous  Airway Management Planned:   Additional Equipment:   Intra-op Plan:   Post-operative Plan:   Informed Consent:   History available from chart only  Plan Discussed with:   Anesthesia Plan Comments:         Anesthesia Quick Evaluation

## 2014-03-24 NOTE — Op Note (Signed)
[name]:  Smay, Breigh                  ACCOUNT NO.:  [id number]  MEDICAL RECORD NO.:  [id number]  LOCATION:  2M14C                        FACILITY:  [facility]  PHYSICIAN:  Abigail Miyamotoouglas Aleksandar Duve, M.D. DATE OF BIRTH:  05/27/1933  DATE OF PROCEDURE:  01/11/14 DATE OF DISCHARGE:                              OPERATIVE REPORT   PREOPERATIVE DIAGNOSIS:  Open abdomen, second-look laparotomy.  POSTOPERATIVE DIAGNOSIS:  Open abdomen, second-look laparotomy.  PROCEDURES: 1. Exploratory laparotomy. 2. Colostomy. 3. Liver biopsy.  SURGEON:  Abigail Miyamotoouglas Oluwaseun Cremer, M.D.  ANESTHESIA:  General.  ESTIMATED BLOOD LOSS:  Minimal.  INDICATIONS:  This is a 80 year old female who had undergone an emergent exploratory laparotomy for perforated colon.  She had multiple areas of ischemic small bowel.  Therefore, her abdomen was left open and a wound VAC was placed.  She now returns for second look laparotomy.  She remains critically ill.  FINDINGS:  The colon itself was well perfused.  The small bowel was run from the ligament of Treitz to the terminal ileum, and there were several areas of patchy ischemia but none of this was circumferential or full-thickness.  There were some worrisome nodules on both lobes of the liver which were biopsied.  These are suspicious for metastatic disease.  PROCEDURE IN DETAIL:  The patient was brought to the operating room, identified as [name].  She was placed supine on the operating table and general anesthesia was induced.  Her abdomen was then prepped and draped in usual sterile fashion.  The previous wound VAC was removed. The patient had minimal adhesions to the abdomen.  I was able to eviscerate her entire small intestines.  I first evaluated the descending colon at the staple line and this was pink and well perfused. I then ran the small bowel from the ligament of Treitz to the terminal ileum.  There was a moderate amount of fibrinous exudate.  There were areas of  patchy ischemia but no full-thickness necrosis and none of the areas were circumferential.  There was minimal peristalsis in the bowel but most of it appeared viable.  There was no area that I could not safely resect without removing much too large amount of small intestines.  On further inspection of the abdomen, I was feeling the liver and felt multiple nodules on both lobes of the liver.  I was able to visualize several and they had a suspicious appearance of metastatic disease.  I excised two of these nodules with electrocautery and sent them to Pathology for evaluation.  I found no nodules on the diaphragm or in the pelvis.  At this point, hemostasis appeared to be achieved.  I made an elliptical incision in the patient's left abdomen.  I took this down to the fascia which I opened in a cruciate fashion.  I then pulled the descending colon up out of this incision as the colostomy.  I then closed the patient's midline fascia with a running #1 looped PDS suture. The opening for the ostomy was quite large.  So, I did close the fascia back with a single interrupted PDS #1 suture.  I then opened up the staple line and __________ the colostomy circumferentially  with interrupted 3-0 Vicryl sutures.  An ostomy appliance was then applied. The patient was then taken in a guarded condition back, still intubated from the operating room to the intensive care unit.  All the counts were correct at the end of procedure.     Abigail Miyamotoouglas Mally Gavina, M.D.     DB/MEDQ  D:  03/30/2014  T:  03/28/2014  Job:  [id number]

## 2014-03-24 NOTE — Progress Notes (Signed)
Chaplain visited with pt and family at bedside.  Pt's granddaughter asked chaplain to come visit with family and pt.  Granddaughter is very concerned about pt's blood pressure and seemed emotional when RN informed that pt is receiving max amount of medicine to regulate pt's BP.  Chaplain provided emotional and spiritual support as well as ministry of empathetic listening and hospitality.  Chaplain will follow up throughout day and as needed.   03/22/2014 0800  Clinical Encounter Type  Visited With Patient and family together  Visit Type Spiritual support;Social support;Critical Care  [facility]  Spiritual Encounters  Spiritual Needs Prayer;Emotional;Grief support  Stress Factors  Patient Stress Factors Exhausted;Health changes;Major life changes  Family Stress Factors Exhausted;Family relationships;Health changes;Major life changes  Advance Directives (For Healthcare)  Does patient have an advance directive? No   Blain PaisOvercash, Venisa Frampton A, Chaplain [phone] 03/14/2014

## 2014-03-24 NOTE — Progress Notes (Signed)
Chaplain checked in on pt at bedside then met with family in waiting area.  Family is exhausted but was able to get some rest today after receiving what they felt to be some good news.  Chaplain provided spiritual and emotional support as well as the ministry of presence.  Chaplain will continue to follow up as needed.   03/16/2014 1600  Clinical Encounter Type  Visited With Patient;Family  Visit Type Follow-up;Social support;Critical Care  Spiritual Encounters  Spiritual Needs Prayer;Emotional  Stress Factors  Patient Stress Factors Exhausted;Health changes  Family Stress Factors Exhausted;Family relationships;Health changes;Major life changes  Advance Directives (For Healthcare)  Does patient have an advance directive? No   Geralyn Flash [id number] 04/01/2014

## 2014-03-24 NOTE — Anesthesia Postprocedure Evaluation (Signed)
  Anesthesia Post-op Note  Patient: Maria LefevreVera M [name]  Procedure(s) Performed: Procedure(s): EXPLORATORY LAPAROTOMY (N/A) COLOSTOMY (N/A)  Patient Location: SICU  Anesthesia Type:General  Level of Consciousness: sedated and Patient remains intubated per anesthesia plan  Airway and Oxygen Therapy: Patient remains intubated per anesthesia plan and Patient placed on Ventilator (see vital sign flow sheet for setting)  Post-op Pain: none  Post-op Assessment: Post-op Vital signs reviewed, Patient's Cardiovascular Status Stable, Respiratory Function Stable, Patent Airway, No signs of Nausea or vomiting and Pain level controlled  Post-op Vital Signs: stable  Last Vitals:  Filed Vitals:   03/27/2014 1000  BP: 94/35  Pulse:   Temp:   Resp:     Complications: No apparent anesthesia complications

## 2014-03-24 NOTE — Transfer of Care (Signed)
Immediate Anesthesia Transfer of Care Note  Patient: Maria LefevreVera M Renninger  Procedure(s) Performed: Procedure(s): EXPLORATORY LAPAROTOMY (N/A) COLOSTOMY (N/A)  Patient Location: ICU  Anesthesia Type:General  Level of Consciousness: sedated  Airway & Oxygen Therapy: Patient remains intubated per anesthesia plan and Patient placed on Ventilator (see vital sign flow sheet for setting)  Post-op Assessment: report given to [place], RN [phone]  Post vital signs: Reviewed and stable  Complications: No apparent anesthesia complications

## 2014-03-24 NOTE — Progress Notes (Signed)
Chaplain joined family, pt's pastor, MD and RN for a goals of care meeting in conference room.  MD explained procedures and steps of care that would be beneficial and appropriate in caring for the pt at this time and the family appears to understand.  Plan is to continue to provide as much treatment as necessary keeping in mind pt comfort and dignity.  MD also informed family that if pt does decline another meeting would need to address changing to even less aggressive measures.  Family also agreed with this.  Pt's pastor shared with family that there is hope that pt will get better but that it is "guarded."  Pt's pastor also wanted to clarify to the family that pt's condition is in "The Lord's hands."  Faith is important to the pt and family as their conversations prior to the consult indicate.  Chaplain provided spiritual and emotional support to family before, during and after consult.  Chaplain checked in with family members to ensure they understood and had their concerns adequately addressed.  Chaplain provided ministry of presence and empathetic listening and will continue to follow up as needed.  03/05/2014 0945  Clinical Encounter Type  Visited With Family;Patient not available;Health care provider  Visit Type Follow-up;Spiritual support;Social support;Patient in surgery;Critical Care  Referral From Nurse;Physician  Spiritual Encounters  Spiritual Needs Prayer;Emotional  Stress Factors  Patient Stress Factors Not reviewed  Family Stress Factors Exhausted;Family relationships;Health changes;Major life changes  Advance Directives (For Healthcare)  Does patient have an advance directive? No   Blain PaisOvercash, Castulo Scarpelli A, Chaplain 03/23/2014

## 2014-03-24 NOTE — Progress Notes (Signed)
PULMONARY / CRITICAL CARE MEDICINE   Name: [name] MRN: [id number] DOB: February 28, 1933    ADMISSION DATE:  18-Apr-2014 CONSULTATION DATE:  03/16/2014  REFERRING MD :  Gaynelle Adu, MD  CHIEF COMPLAINT:  Abdominal pain  INITIAL PRESENTATION: 80 y.o. female with history of CAD s/p CABG, systolic CHF, COPD, tobacco use, ESRD presenting with pneumoperitoneum. Difficult extubation expected postoperatively and PCCM was consulted.   STUDIES:  CT Abd 10/20 >> pneumoperitoneum with diffusely thickened bowel  SIGNIFICANT EVENTS: 10/20 Ex lap and removal of necrotic bowel; on neo 10/22 Back to OR; pt made DNR after family meeting  SUBJECTIVE: Pt sedated, stable postop; per family meeting with Dr. Sung Amabile will continue full treatment but agree not to attempt resuscitation in event of cardiac arrest.   VITAL SIGNS: Temp:  [94.8 F (34.9 C)-99 F (37.2 C)] 99 F (37.2 C) (10/22 0842) Pulse Rate:  [75-85] 76 (10/22 0758) Resp:  [14-22] 17 (10/22 0758) BP: (78-134)/(38-74) 98/38 mmHg (10/22 0758) SpO2:  [98 %-100 %] 100 % (10/22 0758) Arterial Line BP: (92-155)/(34-77) 108/38 mmHg (10/22 0700) FiO2 (%):  [40 %-50 %] 40 % (10/22 0759) Weight:  [110 lb 0.2 oz (49.9 kg)] 110 lb 0.2 oz (49.9 kg) (10/22 0500) HEMODYNAMICS: CVP:  [3 mmHg-10 mmHg] 3 mmHg VENTILATOR SETTINGS: Vent Mode:  [-] PRVC FiO2 (%):  [40 %-50 %] 40 % Set Rate:  [16 bmp] 16 bmp Vt Set:  [500 mL] 500 mL PEEP:  [5 cmH20] 5 cmH20 Plateau Pressure:  [20 cmH20-26 cmH20] 21 cmH20 INTAKE / OUTPUT:  Intake/Output Summary (Last 24 hours) at 03/05/2014 4098 Last data filed at 03/13/2014 0700  Gross per 24 hour  Intake 2767.68 ml  Output   2770 ml  Net  -2.32 ml    PHYSICAL EXAMINATION: General: Elderly female sedated on vent Neuro: RASS -3  HEENT: ETT in place, NGT in R nare Cardiovascular: Regular distant heart sounds without appreciable murmur or JVD. Paced rhythm on monitor. +Thrill in LUE AVF.  Lungs: Scattered wheezes  on R > L no crackles.  Abdomen: Wound vac with good seal in midline incision. ND.  Musculoskeletal: No gross deformities. No edema. Skin: No rash, wound or ecchymosis noted.   LABS:  CBC  Recent Labs Lab 03/19/14 0639 03/23/14 0106 03/23/14 0448 03/23/2014 0358  WBC 5.3  --  2.6* 20.3*  HGB 11.9* 13.9 13.4 13.7  HCT 38.1 41.0 43.2 42.8  PLT 113*  --  110* 70*   Coag's  Recent Labs Lab 04/18/14 2257 03/23/14 0448 04/02/2014 0358  APTT 25  --  33  INR 1.20 1.34  --    BMET  Recent Labs Lab 03/23/14 0448 03/23/14 2320 03/15/2014 0358  NA 143 135* 139  K 4.5 4.9 5.1  CL 104 97 101  CO2 19 19 22   BUN 73* 49* 43*  CREATININE 5.30* 3.62* 3.25*  GLUCOSE 127* 159* 144*   Electrolytes  Recent Labs Lab 03/23/14 0448 03/23/14 0500 03/23/14 2320 03/09/2014 0358  CALCIUM 7.3*  --  7.3* 7.3*  MG 1.5  --   --  1.8  PHOS  --  4.7* 4.8* 4.6   Sepsis Markers  Recent Labs Lab 03/23/14 0700  LATICACIDVEN 2.1   ABG  Recent Labs Lab 03/23/14 0106 03/23/14 0130 03/23/14 0700  PHART 7.349* 7.299* 7.344*  PCO2ART 45.2* 45.4* 40.4  PO2ART 229.0* 144.0* 137.0*   Liver Enzymes  Recent Labs Lab 03/23/14 0448 03/23/14 2320 03/16/2014 0358  AST 19  --  31  ALT 15  --  17  ALKPHOS 96  --  97  BILITOT 0.5  --  0.4  ALBUMIN 2.6* 2.4* 2.2*   Cardiac Enzymes  Recent Labs Lab 11/26/13 2257 03/23/14 0448 03/23/14 1045  TROPONINI <0.30 <0.30 <0.30   Glucose  Recent Labs Lab 03/23/14 1514 03/23/14 1858 03/23/14 2107 03/23/14 2330 03/26/2014 0348 03/28/2014 0841  GLUCAP 76 59* 142* 141* 131* 104*    Imaging Dg Chest Port 1 View  03/23/2014   CLINICAL DATA:  Check central line placement  EXAM: PORTABLE CHEST - 1 VIEW  COMPARISON:  03/23/2014 1814 hrs  FINDINGS: Cardiac shadow is stable. Postsurgical changes are again seen. A pacing device is again noted and stable. The endotracheal tube is noted 4.4 cm above the carina. A nasogastric catheter courses towards  the stomach. The left central venous line has been advanced and now lies with the tip in the proximal superior vena cava. No pneumothorax is noted. Bibasilar infiltrates are again identified.  IMPRESSION: Tubes and lines as described.  Stable bibasilar changes.   Electronically Signed   By: Alcide CleverMark  Lukens M.D.   On: 03/23/2014 20:25   Dg Chest Port 1 View  03/23/2014   CLINICAL DATA:  Left central line placement.  EXAM: PORTABLE CHEST - 1 VIEW  COMPARISON:  03/23/2014 at 24:49 a.m.  FINDINGS: Endotracheal tube tip is indistinct but believed to be about 4.4 cm above the carina. Nasogastric tube enters the stomach. Left IJ line appears to of retracted slightly and now projects over the bottom of the aortic arch. This is probably in the innominate vein.  Prior CABG. Indistinct pulmonary vasculature. Stable low level airspace opacity in the right infrahilar region and retrocardiac region. coronary stent.  IMPRESSION: 1. Left IJ line has retracted somewhat, with tip projecting over the lower margin of the thoracic aorta, but likely in a venous structure given prior positioning. One could perform lateral view or chest CT to confirm line positioning. 2. Stable mild bibasilar airspace opacities. Interstitial accentuation in the upper lung zones may reflect mild pulmonary venous hypertension.   Electronically Signed   By: Herbie BaltimoreWalt  Liebkemann M.D.   On: 03/23/2014 18:44   Dg Chest Port 1 View  03/23/2014   CLINICAL DATA:  Evaluate endotracheal tube placement  EXAM: PORTABLE CHEST - 1 VIEW  COMPARISON:  Chest x-ray of 03/15/2014  FINDINGS: The tip of the endotracheal tube is approximately 2.9 cm above the carina. Cardiomegaly and minimal pulmonary vascular congestion remains. Left central venous line is present with the tip seen to the junction of the left innominate vein with the SVC. Pacemaker remains.  IMPRESSION: 1. Endotracheal tube 2.9 cm above the carina. 2. Left IJ central venous line tip near the junction of the  left innominate vein and SVC. 3. Stable cardiomegaly with minimal congestion endotracheal tube placement   Electronically Signed   By: Dwyane DeePaul  Barry M.D.   On: 03/23/2014 07:52   Dg Abd Portable 1v  03/23/2014   CLINICAL DATA:  Encounter for nasogastric tube placement  EXAM: PORTABLE ABDOMEN - 1 VIEW  COMPARISON:  CT abdomen pelvis of 07/06/2013  FINDINGS: The tip of the feeding tube is below the hemidiaphragm probably overlying the mid portion of the stomach. The bowel gas pattern is nonspecific. Considerable arterial calcification is present throughout the abdominal aorta and iliac arteries.  IMPRESSION: Tip of NG tube overlies the region of the body of the stomach.   Electronically Signed   By: Renae FicklePaul  Gery PrayBarry M.D.   On: 03/23/2014 07:49     ASSESSMENT / PLAN:  PULMONARY OETT 10/21>>> A: Postoperative acute respiratory failure  COPD Tobacco use P:   Full ventilator support VAP prevention bundle BD's prn, brovana and pulmicort, atrovent  CARDIOVASCULAR L IJ 10/21>>> A-line 10/21>>> A: Shock -  Chronic systolic CHF CAD s/p 3v CABG History of atrial fibrillation History of complete heart block with pacemaker P:  Continue levophed and neo Hold hydralazine 25mg  TID, lasix 80mg  BID Daily weights  RENAL A:  ESRD (HD on MWF) P:   CRRT per nephrology  GASTROINTESTINAL A:  Perforated viscus with peritonitis Nutrition P:   Per surgery NPO NGT to LIS IVF Abx as below SUP: PPI IV  HEMATOLOGIC A:  History of anemia Thrombocytopenia P: Follow CBC D/C SubQ heparin; no HIT panel now SCDs  INFECTIOUS A:  Peritonitis with septic shock History of bacterial endocarditis July 2015 HCAP P:   resp culture 10/21 >> BCx2 10/21 >>  Abx: Vancomycin 10/20 >>  Abx: Zosyn 10/21 >>  Add diflucan if worse  ENDOCRINE A:  History of hypothyroidism  P:   Continue synthroid 150mcg CBG q4h  NEUROLOGIC A:  Sedation P:   RASS goal: -2 Daily WUA   Family updated: 10/21 following  surgery.    Interdisciplinary Family Meeting v Palliative Care Meeting:    TODAY'S SUMMARY: Full vent support, wash out today; no further OR planned. 30 min family meeting: DNR, continue full treatment for now. If not substantially improving by first of next week, consider full comfort care  50 mins CCM time  Billy Fischeravid Simonds, MD ; [facility] Mobile [phone].  After 5:30 PM or weekends, call [phone]  03/26/2014 9:18 AM

## 2014-03-24 NOTE — Op Note (Signed)
EXPLORATORY LAPAROTOMY, COLOSTOMY  Procedure Note  Jacalyn LefevreVera M Melone 03/04/2014 - 2013/08/22   Pre-op Diagnosis: Open Abdomen,2nd Look     Post-op Diagnosis: same  Procedure(s): EXPLORATORY LAPAROTOMY COLOSTOMY LIVER BIOPSY  Surgeon(s): Abigail Miyamotoouglas Alecsander Hattabaugh, MD  Anesthesia: General  FINDINGS:  STILL WITH PATCHY AREAS OF ISCHEMIA BUT NONE OF THESE WERE CIRCUMFERENTIAL.                      SEVERAL NODULES ON THE LIVER SUSPICIOUS FOR MALIGNANCY WERE BIOPSIED.  Staff:  Circulator: Ocie BobKassie C Robinson, RN; Alpha GulaKathy Somers, RN; Doy MinceSharon P Hitchcock, RN Scrub Person: Leighton ParodyAnn Marie Wilson, CST; Huel CoventryMelanie A Saxton  Estimated Blood Loss: Minimal               Specimens: liver biopsy          Jovanne Riggenbach A   Date: 2013/08/22  Time: 9:49 AM

## 2014-03-24 NOTE — Progress Notes (Addendum)
PARENTERAL NUTRITION CONSULT NOTE   Pharmacy Consult for TPN Indication: pneumoperitoneum with bowel resection  Allergies  Allergen Reactions  . Ivp Dye [Iodinated Diagnostic Agents] Swelling  . Lisinopril Swelling  . Iohexol Itching and Swelling         Patient Measurements: Height: 5\' 3"  (160 cm) Weight: 110 lb 0.2 oz (49.9 kg) IBW/kg (Calculated) : 52.4  Vital Signs: Temp: 99 F (37.2 C) (10/22 0842) Temp Source: Oral (10/22 0842) BP: 98/38 mmHg (10/22 0758) Pulse Rate: 76 (10/22 0758) Intake/Output from previous day: 10/21 0701 - 10/22 0700 In: 2907.7 [I.V.:2437; IV Piggyback:100; TPN:370.7] Out: 2970 [Emesis/NG output:350; Drains:1225] Intake/Output from this shift:    Labs:  Recent Labs  02/28/2014 2257 03/23/14 0106 03/23/14 0448 03/07/2014 0358  WBC  --   --  2.6* 20.3*  HGB  --  13.9 13.4 13.7  HCT  --  41.0 43.2 42.8  PLT  --   --  110* 70*  APTT 25  --   --  33  INR 1.20  --  1.34  --      Recent Labs  03/23/14 0448 03/23/14 0500 03/23/14 2320 03/05/2014 0358  NA 143  --  135* 139  K 4.5  --  4.9 5.1  CL 104  --  97 101  CO2 19  --  19 22  GLUCOSE 127*  --  159* 144*  BUN 73*  --  49* 43*  CREATININE 5.30*  --  3.62* 3.25*  CALCIUM 7.3*  --  7.3* 7.3*  MG 1.5  --   --  1.8  PHOS  --  4.7* 4.8* 4.6  PROT 4.8*  --   --  5.1*  ALBUMIN 2.6*  --  2.4* 2.2*  AST 19  --   --  31  ALT 15  --   --  17  ALKPHOS 96  --   --  97  BILITOT 0.5  --   --  0.4  TRIG  --   --   --  199*   Estimated Creatinine Clearance: 11.1 ml/min (by C-G formula based on Cr of 3.25).    Recent Labs  03/23/14 2330 03/30/2014 0348 03/04/2014 0841  GLUCAP 141* 131* 104*   Insulin Requirements in the past 24 hours:  2 units SSI  Current Nutrition:  Clinimix NO LYTES at 9930mL/hr + IVFE at 1610mL/hr- provides 36g protein and 991kcal daily  Nutritional Goals:  1024kCal, 65-80 grams of protein per day per RD recommendations 10/21  Admit: 81 YOF admitted 10/20 after  transfer from [facility] with perforated bowel- CT showed free air and thickened small bowel. She was taken to OR 10/20 PM for ex lap, application of open wound VAC and sigmoid colon resection.   GI: hx duodenal ulcers; POD#2 ex lap, sigmoid colon resection. Bback to OR today for re-exploration along with possible resection and colostomy- has patchy areas of ischemia and several liver nodules suspicious for malignancy which were biopsied. Baseline prealbumin in process. On IV PPI  Endo: hx hypothyroidism, TSH 1.6 on admit, continued on home Synthroid . No noted history of DM, no A1C on file. CBGs 59-159 since starting TPN  Lytes: NO LYTES IN TPN; Na 139, K 5.1, Mag 1.8, Phos 4.6, CorCa 8.6>> ca x phos = 39  Renal: ESRD on HD MWF- last HD was 10/19. Renal started CRRT 10/21 PM  Pulm: remains ventilated, 40% FiO2  Cards: CAD s/p CABG in 2008, HF with EF 25-30%, afib. Currently  requiring norepinephrine 4630mcg/min- BP remains soft, HR ok  Hepatobil: LFTs remain WNL. Albumin low at 2.2. Baseline trigs 199  Neuro: GCS 13, RASS -1. On fent drip ordered.  ID: Started empirically on Zosyn/vancomycin for bowel perf. Blood and urine cultures with no growth, trach aspirate with few candida. WBC 20.3, currently afebrile  Best Practices: subq hep, PPI IV  TPN Access: CVC double lumen left IJ placed 10/20 TPN day#: 1 (started 10/21)  Plan:  1. Increase Clinimix 5/15 NO LYTES to 4840mL/hr. Will NOT give IVFE so as to avoid overfeeding- this will provide 48g protein and 682kcal in 24 hours. Goal TPN rate based on current nutrition goals will be Clinimix 5/15 at 1755mL/hr with IVFE at 1010mL/hr one day a week- this will provide 66g protein daily and an average of 1005kcal 2. Multivitamin and trace elements in TPN 3. Continue SSI and CBGs q4h 4. maintenance fluids per CCM and Renal 5. Renal panel and mag ordered daily   Jennye Runquist D. Valina Maes, PharmD, BCPS Clinical Pharmacist Pager: [phone] 03/18/2014 9:32 AM

## 2014-03-25 ENCOUNTER — Encounter (HOSPITAL_COMMUNITY): Payer: Self-pay | Admitting: Surgery

## 2014-03-25 DIAGNOSIS — Z9889 Other specified postprocedural states: Secondary | ICD-10-CM

## 2014-03-25 LAB — RENAL FUNCTION PANEL
ALBUMIN: 2.2 g/dL — AB (ref 3.5–5.2)
Anion gap: 12 (ref 5–15)
BUN: 22 mg/dL (ref 6–23)
CALCIUM: 7.9 mg/dL — AB (ref 8.4–10.5)
CHLORIDE: 101 meq/L (ref 96–112)
CO2: 23 mEq/L (ref 19–32)
CREATININE: 1.48 mg/dL — AB (ref 0.50–1.10)
GFR calc Af Amer: 38 mL/min — ABNORMAL LOW (ref >90)
GFR calc non Af Amer: 32 mL/min — ABNORMAL LOW (ref >90)
Glucose, Bld: 106 mg/dL — ABNORMAL HIGH (ref 70–99)
Phosphorus: 2.3 mg/dL (ref 2.3–4.6)
Potassium: 4.5 mEq/L (ref 3.7–5.3)
Sodium: 136 mEq/L — ABNORMAL LOW (ref 137–147)

## 2014-03-25 LAB — CULTURE, RESPIRATORY

## 2014-03-25 LAB — GLUCOSE, CAPILLARY
GLUCOSE-CAPILLARY: 89 mg/dL (ref 70–99)
GLUCOSE-CAPILLARY: 90 mg/dL (ref 70–99)
GLUCOSE-CAPILLARY: 97 mg/dL (ref 70–99)
Glucose-Capillary: 80 mg/dL (ref 70–99)
Glucose-Capillary: 78 mg/dL (ref 70–99)
Glucose-Capillary: 92 mg/dL (ref 70–99)

## 2014-03-25 LAB — CBC
HCT: 33.1 % — ABNORMAL LOW (ref 36.0–46.0)
Hemoglobin: 10.5 g/dL — ABNORMAL LOW (ref 12.0–15.0)
MCH: 32.8 pg (ref 26.0–34.0)
MCHC: 31.7 g/dL (ref 30.0–36.0)
MCV: 103.4 fL — ABNORMAL HIGH (ref 78.0–100.0)
PLATELETS: 41 10*3/uL — AB (ref 150–400)
RBC: 3.2 MIL/uL — ABNORMAL LOW (ref 3.87–5.11)
RDW: 17 % — ABNORMAL HIGH (ref 11.5–15.5)
WBC: 14.5 10*3/uL — ABNORMAL HIGH (ref 4.0–10.5)

## 2014-03-25 LAB — CULTURE, RESPIRATORY W GRAM STAIN

## 2014-03-25 LAB — MAGNESIUM: MAGNESIUM: 2 mg/dL (ref 1.5–2.5)

## 2014-03-25 LAB — APTT: aPTT: 35 seconds (ref 24–37)

## 2014-03-25 MED ORDER — PIPERACILLIN-TAZOBACTAM IN DEX 2-0.25 GM/50ML IV SOLN
2.2500 g | Freq: Three times a day (TID) | INTRAVENOUS | Status: DC
  Administered 2014-03-25 – 2014-03-30 (×13): 2.25 g via INTRAVENOUS
  Filled 2014-03-25 (×19): qty 50

## 2014-03-25 MED ORDER — LEVOTHYROXINE SODIUM 100 MCG IV SOLR
75.0000 ug | Freq: Every day | INTRAVENOUS | Status: DC
Start: 2014-03-25 — End: 2014-03-28
  Administered 2014-03-25 – 2014-03-27 (×3): 75 ug via INTRAVENOUS
  Filled 2014-03-25 (×4): qty 5

## 2014-03-25 MED ORDER — TRACE MINERALS CR-CU-F-FE-I-MN-MO-SE-ZN IV SOLN
INTRAVENOUS | Status: AC
  Administered 2014-03-25: 17:00:00 via INTRAVENOUS
  Filled 2014-03-25: qty 2000

## 2014-03-25 MED ORDER — TIOTROPIUM BROMIDE MONOHYDRATE 18 MCG IN CAPS
18.0000 ug | ORAL_CAPSULE | Freq: Every day | RESPIRATORY_TRACT | Status: DC
Start: 2014-03-25 — End: 2014-04-08
  Administered 2014-03-26 – 2014-04-07 (×12): 18 ug via RESPIRATORY_TRACT
  Filled 2014-03-25 (×3): qty 5

## 2014-03-25 MED ORDER — FENTANYL CITRATE 0.05 MG/ML IJ SOLN
25.0000 ug | INTRAMUSCULAR | Status: DC | PRN
  Administered 2014-03-25: 100 ug via INTRAVENOUS
  Administered 2014-03-25 – 2014-03-28 (×6): 50 ug via INTRAVENOUS
  Administered 2014-03-28 (×2): 100 ug via INTRAVENOUS
  Administered 2014-03-28 (×4): 50 ug via INTRAVENOUS
  Administered 2014-03-29: 100 ug via INTRAVENOUS
  Filled 2014-03-25 (×12): qty 2

## 2014-03-25 MED ORDER — ALBUTEROL SULFATE (2.5 MG/3ML) 0.083% IN NEBU
2.5000 mg | INHALATION_SOLUTION | RESPIRATORY_TRACT | Status: DC | PRN
  Administered 2014-04-02: 2.5 mg via RESPIRATORY_TRACT
  Filled 2014-03-25 (×2): qty 3

## 2014-03-25 MED ORDER — HEPARIN SODIUM (PORCINE) 1000 UNIT/ML DIALYSIS
1000.0000 [IU] | Freq: Once | INTRAMUSCULAR | Status: AC
  Administered 2014-03-25: 3200 [IU] via INTRAVENOUS_CENTRAL
  Filled 2014-03-25: qty 4
  Filled 2014-03-25: qty 6

## 2014-03-25 MED ORDER — PIPERACILLIN-TAZOBACTAM IN DEX 2-0.25 GM/50ML IV SOLN
2.2500 g | Freq: Three times a day (TID) | INTRAVENOUS | Status: DC
  Filled 2014-03-25: qty 50

## 2014-03-25 NOTE — Progress Notes (Signed)
NUTRITION FOLLOW UP  Intervention:   - TPN per pharmacy to meet protein and calorie needs (needs re-calculated due to pt on CRRT) - RD will continue to monitor.   Nutrition Dx:   Inadequate oral intake related to inability to eat as evidenced by NPO.  Goal:   Pt to meet >/= 90% of their estimated nutrition needs   Monitor:   TPN tolerance/adequacy, weight trend, labs  Assessment:   80 y.o. female with history of CAD s/p CABG, systolic CHF, COPD, tobacco use, ESRD presenting with pneumoperitoneum.   10/21: S/P exploratory laparotomy, sigmoid colon resection, and application of wound VAC for sigmoid perforation with intestinal ischemia on 10/20. Surgery planning to take back to OR tomorrow for re-exploration along with possible resection and colostomy. TPN being initiated today. Renal consult pending. If CRRT started, will need increased calories and protein.  10/23: - Pt extubated 10/23. - Consult received for re-calculation of nutritional needs due to pt starting CRRT for ARF.   - Pt confused and tolerating TPN at this time.  - s/p ex lap with colon resection and subsequent liver biopsy (awaiting results) and creation of colostomy with abdominal closure.   Labs: Na low K, Phos, Mag and BUN wnl  Height: Ht Readings from Last 1 Encounters:  03/23/14 5\' 3"  (1.6 m)    Weight Status:   Wt Readings from Last 1 Encounters:  03/25/14 106 lb 0.7 oz (48.1 kg)    Re-estimated needs:  Kcal: 2000-2200 Protein: 75-85 g  Fluid: Per MD  Skin: surgical incision to abdomen with open wound vac  Diet Order: NPO   Intake/Output Summary (Last 24 hours) at 03/25/14 1408 Last data filed at 03/25/14 1300  Gross per 24 hour  Intake 2127.92 ml  Output   2435 ml  Net -307.08 ml    Last BM: colostomy   Labs:   Recent Labs Lab 03/23/14 0448  Mar 29, 2014 0358 Mar 29, 2014 1600 03/25/14 0442  NA 143  < > 139 135* 136*  K 4.5  < > 5.1 4.7 4.5  CL 104  < > 101 101 101  CO2 19  < > 22  21 23   BUN 73*  < > 43* 31* 22  CREATININE 5.30*  < > 3.25* 2.20* 1.48*  CALCIUM 7.3*  < > 7.3* 7.2* 7.9*  MG 1.5  --  1.8  --  2.0  PHOS  --   < > 4.6 3.4 2.3  GLUCOSE 127*  < > 144* 105* 106*  < > = values in this interval not displayed.  CBG (last 3)   Recent Labs  03/25/14 0433 03/25/14 0821 03/25/14 1132  GLUCAP 97 89 80    Scheduled Meds: . antiseptic oral rinse  7 mL Mouth Rinse QID  . arformoterol  15 mcg Nebulization BID  . budesonide (PULMICORT) nebulizer solution  0.5 mg Nebulization BID  . chlorhexidine  15 mL Mouth Rinse BID  . insulin aspart  0-9 Units Subcutaneous 6 times per day  . levothyroxine  75 mcg Intravenous Daily  . olopatadine  1 drop Both Eyes BID  . ondansetron (ZOFRAN) IV  4 mg Intravenous 3 times per day  . pantoprazole (PROTONIX) IV  40 mg Intravenous Daily  . piperacillin-tazobactam (ZOSYN)  IV  2.25 g Intravenous 3 times per day  . tiotropium  18 mcg Inhalation Daily    Continuous Infusions: . sodium chloride 40 mL/hr at 03/25/14 1222  . norepinephrine (LEVOPHED) Adult infusion 4 mcg/min (03/25/14 0500)  .  TPN (CLINIMIX) Adult without lytes 40 mL/hr at 01/06/14 1759  . TPN [facility](CLINIMIX) Adult without lytes      Emmaline KluverHaley Johanan Skorupski RD, LDN

## 2014-03-25 NOTE — Progress Notes (Signed)
PULMONARY / CRITICAL CARE MEDICINE   Name: [name] MRN: [id number] DOB: 1932/11/03    ADMISSION DATE:  03/19/2014 CONSULTATION DATE:  03/25/2014  REFERRING MD :  Gaynelle AduEric Wilson, MD  CHIEF COMPLAINT:  Abdominal pain  INITIAL PRESENTATION: 80 y.o. female with history of CAD s/p CABG, systolic CHF, COPD, tobacco use, ESRD presenting with pneumoperitoneum. Difficult extubation expected postoperatively and PCCM was consulted.   STUDIES:  CT Abd 10/20 >> pneumoperitoneum with diffusely thickened bowel  SIGNIFICANT EVENTS: 10/20 Ex lap and removal of necrotic bowel; on neo 10/21 On levophed and neo 10/22 Back to OR, fascial closure. Finding of multiple liver nodules not previously noted. Liver biopsy obtained 10/22 Family conference. Pt made DNR in event of cardiac arrest.  10/23 Off vasopressors, extubated with adequate resp status post extubaiton  SUBJECTIVE:  RASS 0, Passed SBT. Extubated and looks good initially with adequate cough  VITAL SIGNS: Temp:  [97 F (36.1 C)-99 F (37.2 C)] 97.3 F (36.3 C) (10/22 2041) Pulse Rate:  [25-150] 77 (10/23 0730) Resp:  [13-34] 27 (10/23 0730) BP: (34-149)/(16-80) 53/31 mmHg (10/23 0700) SpO2:  [25 %-100 %] 100 % (10/23 0730) Arterial Line BP: (73-210)/(31-62) 132/45 mmHg (10/23 0730) FiO2 (%):  [40 %] 40 % (10/23 0729) Weight:  [106 lb 0.7 oz (48.1 kg)] 106 lb 0.7 oz (48.1 kg) (10/23 0500) HEMODYNAMICS: CVP:  [1 mmHg-8 mmHg] 8 mmHg VENTILATOR SETTINGS: Vent Mode:  [-] PRVC FiO2 (%):  [40 %] 40 % Set Rate:  [16 bmp] 16 bmp Vt Set:  [500 mL] 500 mL PEEP:  [5 cmH20] 5 cmH20 Plateau Pressure:  [15 cmH20-23 cmH20] 22 cmH20 INTAKE / OUTPUT:  Intake/Output Summary (Last 24 hours) at 03/25/14 0753 Last data filed at 03/25/14 0700  Gross per 24 hour  Intake 2447.21 ml  Output   2172 ml  Net 275.21 ml    PHYSICAL EXAMINATION: General: NAD Neuro: No focal deficits HEENT: WNL Cardiovascular: Regular, no murmur Lungs: Coarse  breath sounds without focal consolidation  Abdomen: Wound vac with good seal in midline incision. ND.  Ext: No gross deformities. No edema. Skin: No rash, wound or ecchymosis noted.   LABS: I have reviewed all of today's lab results. Relevant abnormalities are discussed in the A/P section  CXR: NNF  ASSESSMENT / PLAN:  PULMONARY OETT 10/21>>>10/23 A:  Acute on chronic respiratory failure  Severe COPD Ongoing tobacco use P:   Supp O2 to maintain SpO2 > 92% Cont nebulized steroids and BDs Airway hygiene PRN BiPAP  CARDIOVASCULAR L IJ 10/21 >>> A-line 10/21 >> 10/23 A:  Septic shock - resolved Chronic systolic CHF, compensated CAD s/p 3v CABG History of atrial fibrillation History of complete heart block with pacemaker P:  Continue low dose levophed (off neo) MAP goal 55 mmHg   RENAL A:  ESRD (HD on MWF) P:   Cont CRRT per nephrology - keep even Monitor BMET intermittently Monitor I/Os Correct electrolytes as indicated  GASTROINTESTINAL A:  Perforated viscus Intestinal ischemia Post laparotomy, sigmoid colectomy Liver nodules - concern for metastatic disease P:   Post op mgmt per CCS NPO, TPN Cont NGT to LIS Await liver biopsy results (colon pathology negative for malignancy) SUP: PPI IV  HEMATOLOGIC A:   Anemia of CKD Thrombocytopenia - holding heparin P: Follow CBC SCDs  INFECTIOUS A:   Severe sepsis Peritonitis History of bacterial endocarditis July 2015 P:   resp culture 10/21 >> Few C. albicans BCx2 10/21 >>  UC 10/21 >> Neg  Abx: Vancomycin 10/20 >> 10/23 Abx: Zosyn 10/21 >>    ENDOCRINE A:   Hypothyroidism  Risk of hyperglycemia on TPN P:   IV levothyroxine @ 1/2 home PO dose Sens scale SSI  NEUROLOGIC A:   Post op pain P:   PRN fentanyl   Family updated: 10/23    Interdisciplinary Family Meeting v Palliative Care Meeting:    TODAY'S SUMMARY:  CCM X 40 mins  Billy Fischeravid Chico Cawood, MD ; [facility] Mobile  [phone].  After 5:30 PM or weekends, call [phone]   03/25/2014 7:54 AM

## 2014-03-25 NOTE — Progress Notes (Signed)
I have seen and examined the patient and agree with the assessment and plans. Pathology on the colon was negative for malignancy.  Path on the liver is pending  Wadsworth Skolnick A. Magnus IvanBlackman  MD, FACS

## 2014-03-25 NOTE — Progress Notes (Addendum)
PARENTERAL NUTRITION CONSULT NOTE   Pharmacy Consult for TPN Indication: pneumoperitoneum with bowel resection  Allergies  Allergen Reactions  . Ivp Dye [Iodinated Diagnostic Agents] Swelling  . Lisinopril Swelling  . Iohexol Itching and Swelling        Patient Measurements: Height: 5\' 3"  (160 cm) Weight: 106 lb 0.7 oz (48.1 kg) IBW/kg (Calculated) : 52.4  Vital Signs: Temp: 97.3 F (36.3 C) (10/22 2041) Temp Source: Oral (10/22 2041) BP: 53/31 mmHg (10/23 0700) Pulse Rate: 75 (10/23 0800) Intake/Output from previous day: 10/22 0701 - 10/23 0700 In: 2447.2 [I.V.:1127.4; IV Piggyback:450; TPN:869.8] Out: 2172 [Emesis/NG output:200; Blood:25] Intake/Output from this shift: Total I/O In: 85 [I.V.:45; TPN:40] Out: 108 [Other:108]  Labs:  Recent Labs  04/02/2014 2257 03/23/14 0106 03/23/14 0448 03/08/2014 0358 03/25/14 0442  WBC  --   --  2.6* 20.3*  --   HGB  --  13.9 13.4 13.7  --   HCT  --  41.0 43.2 42.8  --   PLT  --   --  110* 70*  --   APTT 25  --   --  33 35  INR 1.20  --  1.34  --   --      Recent Labs  03/23/14 0448  03/21/2014 0358 03/31/2014 1600 03/25/14 0442  NA 143  < > 139 135* 136*  K 4.5  < > 5.1 4.7 4.5  CL 104  < > 101 101 101  CO2 19  < > 22 21 23   GLUCOSE 127*  < > 144* 105* 106*  BUN 73*  < > 43* 31* 22  CREATININE 5.30*  < > 3.25* 2.20* 1.48*  CALCIUM 7.3*  < > 7.3* 7.2* 7.9*  MG 1.5  --  1.8  --  2.0  PHOS  --   < > 4.6 3.4 2.3  PROT 4.8*  --  5.1*  --   --   ALBUMIN 2.6*  < > 2.2* 2.2* 2.2*  AST 19  --  31  --   --   ALT 15  --  17  --   --   ALKPHOS 96  --  97  --   --   BILITOT 0.5  --  0.4  --   --   PREALBUMIN  --   --  14.1*  --   --   TRIG  --   --  199*  --   --   < > = values in this interval not displayed. Estimated Creatinine Clearance: 23.4 ml/min (by C-G formula based on Cr of 1.48).    Recent Labs  03/27/2014 2037 03/25/14 03/25/14 0433  GLUCAP 84 90 97   Insulin Requirements in the past 24 hours:  None  required  Current Nutrition:  Clinimix 5/15 at 6960mL/hr and 20% lipid emulsion at 6110ml/hr only on Wed. This will provide average of 72g protein and 1056 kcal.  Nutritional Goals:  1024kCal, 65-80 grams of protein per day per RD recommendations 10/21   Admit: 81 YOF admitted 10/20 after transfer from [facility] with perforated bowel- CT showed free air and thickened small bowel. She was taken to OR 10/20 PM for ex lap, application of open wound VAC and sigmoid colon resection.   GI: hx duodenal ulcers; POD#3 ex lap, sigmoid colon resection. Went back to OR 10/22 for re-exp, colostomy - also found to have several liver nodules suspicious for malignancy which were biopsied. Baseline prealbumin 14.1 (slightly low). NGT o/p 275/24h.  On IV PPI  Endo: hx hypothyroidism, TSH 1.6 on admit, continued on home Synthroid . No noted history of DM, no A1C on file. CBGs low-nl since starting TPN  Lytes: NO LYTES IN TPN; Lytes wnl, Corr Ca 9.3  Renal: ESRD on HD MWF. Renal started CRRT 10/21 PM - tolerating. CRRT to stop today and plan for regular HD.  Pulm: Extubated today. 2L [place].  Cards: CAD s/p CABG in 2008, HF with EF 25-30%, afib. Norepi requirements down to 4 mcg/min. HR ok  Hepatobil: LFTs remain WNL. Albumin low at 2.2. Baseline TG 199  Neuro: GCS 13. On fent drip.  ID: Started empirically on Zosyn/vancomycin for bowel perf. Blood and urine cultures with no growth to date, trach aspirate with few candida. WBC 20.3, currently afebrile.  Best Practices: subq hep, PPI IV  TPN Access: CVC double lumen left IJ placed 10/20  TPN day#: 2 (started 10/21)  Plan:  1. Increase Clinimix 5/15 (NO ELECTROLYTES) to 1660mL/hr. Will only give IVFE once per week (on Wed). This will provide average of 72g protein and 1056 kcal. 2. Multivitamin and trace elements in TPN 3. Continue SSI and CBGs q4h. If these remain at goal on goal TPN, could consider d/cing or changing to q8h. 4. Maintenance fluids per CCM and  Renal 5. Will f/u daily labs  Christoper Fabianaron Abbott Jasinski, PharmD, BCPS Clinical pharmacist, pager [phone] 03/25/2014 8:06 AM

## 2014-03-25 NOTE — Procedures (Signed)
Extubation Procedure Note  Patient Details:   Name: Jacalyn LefevreVera M Bonsell DOB: 1932-09-10 MRN: [id number]   Airway Documentation:     Evaluation  O2 sats: stable throughout Complications: No apparent complications Patient did tolerate procedure well. Bilateral Breath Sounds: Coarse crackles Suctioning: Airway Yes Placed to 4l/min [place] Good vocalization   Newt LukesGroendal, Ruthella Kirchman Ann 03/25/2014, 9:21 AM

## 2014-03-25 NOTE — Progress Notes (Signed)
Chaplain has followed up with pt and family throughout the AM.  Pt tried to speak to chaplain despite being intubated.  RN informed they were about to remove the tube soon.  Pt seemed agitated.   Chaplain returned later and pt seemed much more comfortable follow removal of breathing tube.  Pt and family were now together and celebrating pt's improvement.  Chaplain provided spiritual and emotional support as well as ministry of presence and hospitality.  Chaplain will continue to follow up as needed.   03/25/14 0920  Clinical Encounter Type  Visited With Patient;Health care provider  Visit Type Follow-up;Spiritual support;Critical Care  Spiritual Encounters  Spiritual Needs Emotional  Stress Factors  Patient Stress Factors Exhausted;Health changes  Family Stress Factors None identified  Advance Directives (For Healthcare)  Does patient have an advance directive? No   Erroll Lunavercash, Suriyah Vergara A, Chaplain

## 2014-03-25 NOTE — Progress Notes (Signed)
Patient ID: [name], female   DOB: 03-05-1933, 80 y.o.   MRN: [id number] 1 Day Post-Op  Subjective: Pt awake on vent and alert.  Shakes her head No to abdominal pain  Objective: Vital signs in last 24 hours: Temp:  [97 F (36.1 C)-97.7 F (36.5 C)] 97.5 F (36.4 C) (10/23 0838) Pulse Rate:  [25-150] 76 (10/23 0815) Resp:  [13-34] 13 (10/23 0830) BP: (34-149)/(16-80) 53/31 mmHg (10/23 0700) SpO2:  [25 %-100 %] 100 % (10/23 0815) Arterial Line BP: (73-210)/(31-62) 121/39 mmHg (10/23 0830) FiO2 (%):  [40 %] 40 % (10/23 0729) Weight:  [106 lb 0.7 oz (48.1 kg)] 106 lb 0.7 oz (48.1 kg) (10/23 0500) Last BM Date:  (pts)  Intake/Output from previous day: 10/22 0701 - 10/23 0700 In: 2447.2 [I.V.:1127.4; IV Piggyback:450; TPN:869.8] Out: 2172 [Emesis/NG output:200; Blood:25] Intake/Output this shift: Total I/O In: 85 [I.V.:45; TPN:40] Out: 108 [Other:108]  PE: Abd: soft, skinny, absent BS, ostomy with serosang sweat, stoma is pink and viable.  Wound is clean, packing removed and fascia intact.  Repacked.  Lab Results:   Recent Labs  03/23/14 0448 03/10/2014 0358  WBC 2.6* 20.3*  HGB 13.4 13.7  HCT 43.2 42.8  PLT 110* 70*   BMET  Recent Labs  03/09/2014 1600 03/25/14 0442  NA 135* 136*  K 4.7 4.5  CL 101 101  CO2 21 23  GLUCOSE 105* 106*  BUN 31* 22  CREATININE 2.20* 1.48*  CALCIUM 7.2* 7.9*   PT/INR  Recent Labs  03/13/2014 2257 03/23/14 0448  LABPROT 15.3* 16.7*  INR 1.20 1.34   CMP     Component Value Date/Time   NA 136* 03/25/2014 0442   K 4.5 03/25/2014 0442   CL 101 03/25/2014 0442   CO2 23 03/25/2014 0442   GLUCOSE 106* 03/25/2014 0442   BUN 22 03/25/2014 0442   CREATININE 1.48* 03/25/2014 0442   CALCIUM 7.9* 03/25/2014 0442   PROT 5.1* 03/27/2014 0358   ALBUMIN 2.2* 03/25/2014 0442   AST 31 03/25/2014 0358   ALT 17 03/05/2014 0358   ALKPHOS 97 03/30/2014 0358   BILITOT 0.4 04/02/2014 0358   GFRNONAA 32* 03/25/2014 0442   GFRAA 38*  03/25/2014 0442   Lipase  No results found for this basename: lipase       Studies/Results: Dg Chest Port 1 View  03/23/2014   CLINICAL DATA:  Check central line placement  EXAM: PORTABLE CHEST - 1 VIEW  COMPARISON:  03/23/2014 1814 hrs  FINDINGS: Cardiac shadow is stable. Postsurgical changes are again seen. A pacing device is again noted and stable. The endotracheal tube is noted 4.4 cm above the carina. A nasogastric catheter courses towards the stomach. The left central venous line has been advanced and now lies with the tip in the proximal superior vena cava. No pneumothorax is noted. Bibasilar infiltrates are again identified.  IMPRESSION: Tubes and lines as described.  Stable bibasilar changes.   Electronically Signed   By: Alcide CleverMark  Lukens M.D.   On: 03/23/2014 20:25   Dg Chest Port 1 View  03/23/2014   CLINICAL DATA:  Left central line placement.  EXAM: PORTABLE CHEST - 1 VIEW  COMPARISON:  03/23/2014 at 24:49 a.m.  FINDINGS: Endotracheal tube tip is indistinct but believed to be about 4.4 cm above the carina. Nasogastric tube enters the stomach. Left IJ line appears to of retracted slightly and now projects over the bottom of the aortic arch. This is probably in the innominate vein.  Prior CABG. Indistinct pulmonary vasculature. Stable low level airspace opacity in the right infrahilar region and retrocardiac region. coronary stent.  IMPRESSION: 1. Left IJ line has retracted somewhat, with tip projecting over the lower margin of the thoracic aorta, but likely in a venous structure given prior positioning. One could perform lateral view or chest CT to confirm line positioning. 2. Stable mild bibasilar airspace opacities. Interstitial accentuation in the upper lung zones may reflect mild pulmonary venous hypertension.   Electronically Signed   By: Herbie BaltimoreWalt  Liebkemann M.D.   On: 03/23/2014 18:44    Anti-infectives: Anti-infectives   Start     Dose/Rate Route Frequency Ordered Stop   03/29/2014  1400  vancomycin (VANCOCIN) 500 mg in sodium chloride 0.9 % 100 mL IVPB     500 mg 100 mL/hr over 60 Minutes Intravenous Every 24 hours 03/23/14 1503     03/23/14 2100  piperacillin-tazobactam (ZOSYN) IVPB 2.25 g     2.25 g 100 mL/hr over 30 Minutes Intravenous 4 times per day 03/23/14 1455     03/23/14 0500  piperacillin-tazobactam (ZOSYN) IVPB 2.25 g  Status:  Discontinued     2.25 g 100 mL/hr over 30 Minutes Intravenous 3 times per day 03/06/2014 2242 03/23/14 1455   03/27/2014 2245  [MAR Hold]  vancomycin (VANCOCIN) IVPB 750 mg/150 ml premix     (On MAR Hold since 03/23/14 0020)   750 mg 150 mL/hr over 60 Minutes Intravenous  Once 03/04/2014 2241 03/23/14 0024       Assessment/Plan  1. POD 3,1 s/p ex lap with colon resection and subsequent liver biopsy and creation of colostomy with abdominal closure 2. Acute renal failure, on CRT, improving 3. Septic shock 4. VDRF  Plan: 1. Patient looks very good today considering all she has been through.  Continue local wound care BID to abdominal wound 2. Routine ostomy care, will consult WOC today 3. Cont abx therapy, will need at least 7 days post op. 4. Defer vent and further care to CCM  LOS: 3 days    Lavaris Sexson E 03/25/2014, 8:43 AM Pager: [phone]

## 2014-03-25 NOTE — Progress Notes (Signed)
  [facility] Progress Note   Subjective: off pressors now, extubated, has ostomy now  Filed Vitals:   03/25/14 1200 03/25/14 1220 03/25/14 1250 03/25/14 1300  BP: 101/47   94/60  Pulse: 76 74 73 77  Temp:      TempSrc:      Resp: 17 20 17 19   Height:      Weight:      SpO2: 100% 87% 91% 96%   Exam: Opens eyes, follows commands No jvd  Chest clear BS bilat  RRR 3/6 SEM no rub, distant heart sounds  Abd large midline vac system in place, dec'd BS, nondistended , LUQ ostomy No LE or UE edema  Neuro is alert, nonfocal   HD: MWF [facility]   Assessment:  1 Ruptured sigmoid colon / ischemia bowel - s/p partial colectomy, colostomy 2 Septic shock - resolved 3 ESRD on CRRT D#3 4 Hx CABG  5 Anemia Hb 13 6 Afib / hx CHB w PPM / hx CABG / CM EF 15-20%  7 Possibly malignant liver nodules -sent to lab    Plan- stop CRRT today. Plan regular HD from here.    Vinson Moselleob Maimuna Leaman MD  pager [phone]    cell [phone]  03/25/2014, 1:26 PM     Recent Labs Lab Jan 17, 2014 0358 Jan 17, 2014 1600 03/25/14 0442  NA 139 135* 136*  K 5.1 4.7 4.5  CL 101 101 101  CO2 22 21 23   GLUCOSE 144* 105* 106*  BUN 43* 31* 22  CREATININE 3.25* 2.20* 1.48*  CALCIUM 7.3* 7.2* 7.9*  PHOS 4.6 3.4 2.3    Recent Labs Lab 03/23/14 0448  Jan 17, 2014 0358 Jan 17, 2014 1600 03/25/14 0442  AST 19  --  31  --   --   ALT 15  --  17  --   --   ALKPHOS 96  --  97  --   --   BILITOT 0.5  --  0.4  --   --   PROT 4.8*  --  5.1*  --   --   ALBUMIN 2.6*  < > 2.2* 2.2* 2.2*  < > = values in this interval not displayed.  Recent Labs Lab 03/23/14 0448 Jan 17, 2014 0358 03/25/14 0845  WBC 2.6* 20.3* 14.5*  NEUTROABS  --  19.3*  --   HGB 13.4 13.7 10.5*  HCT 43.2 42.8 33.1*  MCV 105.1* 102.6* 103.4*  PLT 110* 70* 41*   . antiseptic oral rinse  7 mL Mouth Rinse QID  . arformoterol  15 mcg Nebulization BID  . budesonide (PULMICORT) nebulizer solution  0.5 mg Nebulization BID  . chlorhexidine  15 mL  Mouth Rinse BID  . insulin aspart  0-9 Units Subcutaneous 6 times per day  . levothyroxine  75 mcg Intravenous Daily  . olopatadine  1 drop Both Eyes BID  . ondansetron (ZOFRAN) IV  4 mg Intravenous 3 times per day  . pantoprazole (PROTONIX) IV  40 mg Intravenous Daily  . piperacillin-tazobactam (ZOSYN)  IV  2.25 g Intravenous 4 times per day  . tiotropium  18 mcg Inhalation Daily   . sodium chloride 40 mL/hr at 03/25/14 1222  . norepinephrine (LEVOPHED) Adult infusion 4 mcg/min (03/25/14 0500)  . TPN (CLINIMIX) Adult without lytes 40 mL/hr at Jan 17, 2014 1759   albuterol, fentaNYL, pramoxine-mineral oil-zinc

## 2014-03-26 ENCOUNTER — Inpatient Hospital Stay (HOSPITAL_COMMUNITY): Payer: Medicare Other

## 2014-03-26 LAB — MAGNESIUM: MAGNESIUM: 2.1 mg/dL (ref 1.5–2.5)

## 2014-03-26 LAB — GLUCOSE, CAPILLARY
GLUCOSE-CAPILLARY: 127 mg/dL — AB (ref 70–99)
Glucose-Capillary: 101 mg/dL — ABNORMAL HIGH (ref 70–99)
Glucose-Capillary: 102 mg/dL — ABNORMAL HIGH (ref 70–99)
Glucose-Capillary: 116 mg/dL — ABNORMAL HIGH (ref 70–99)
Glucose-Capillary: 123 mg/dL — ABNORMAL HIGH (ref 70–99)

## 2014-03-26 LAB — BASIC METABOLIC PANEL
Anion gap: 11 (ref 5–15)
BUN: 43 mg/dL — ABNORMAL HIGH (ref 6–23)
CHLORIDE: 94 meq/L — AB (ref 96–112)
CO2: 27 meq/L (ref 19–32)
Calcium: 8.3 mg/dL — ABNORMAL LOW (ref 8.4–10.5)
Creatinine, Ser: 2.22 mg/dL — ABNORMAL HIGH (ref 0.50–1.10)
GFR calc Af Amer: 23 mL/min — ABNORMAL LOW (ref >90)
GFR calc non Af Amer: 20 mL/min — ABNORMAL LOW (ref >90)
Glucose, Bld: 112 mg/dL — ABNORMAL HIGH (ref 70–99)
Potassium: 3.4 mEq/L — ABNORMAL LOW (ref 3.7–5.3)
SODIUM: 132 meq/L — AB (ref 137–147)

## 2014-03-26 LAB — PHOSPHORUS: PHOSPHORUS: 2 mg/dL — AB (ref 2.3–4.6)

## 2014-03-26 MED ORDER — TRACE MINERALS CR-CU-F-FE-I-MN-MO-SE-ZN IV SOLN
INTRAVENOUS | Status: AC
  Administered 2014-03-26: 20:00:00 via INTRAVENOUS
  Filled 2014-03-26: qty 2000

## 2014-03-26 MED ORDER — POTASSIUM PHOSPHATES 15 MMOLE/5ML IV SOLN
20.0000 mmol | Freq: Once | INTRAVENOUS | Status: AC
  Administered 2014-03-26: 20 mmol via INTRAVENOUS
  Filled 2014-03-26: qty 6.67

## 2014-03-26 MED ORDER — LORAZEPAM 2 MG/ML IJ SOLN
INTRAMUSCULAR | Status: AC
  Filled 2014-03-26: qty 1

## 2014-03-26 MED ORDER — INSULIN ASPART 100 UNIT/ML ~~LOC~~ SOLN
0.0000 [IU] | Freq: Three times a day (TID) | SUBCUTANEOUS | Status: DC
  Administered 2014-03-26 – 2014-03-27 (×2): 1 [IU] via SUBCUTANEOUS

## 2014-03-26 MED ORDER — LORAZEPAM 2 MG/ML IJ SOLN
0.5000 mg | Freq: Once | INTRAMUSCULAR | Status: AC
  Administered 2014-03-26: 0.5 mg via INTRAVENOUS

## 2014-03-26 MED ORDER — SODIUM CHLORIDE 0.9 % IJ SOLN
10.0000 mL | INTRAMUSCULAR | Status: DC | PRN
  Administered 2014-03-31 – 2014-04-04 (×2): 10 mL
  Filled 2014-03-26: qty 40

## 2014-03-26 NOTE — Progress Notes (Signed)
Patient ID: [name], female   DOB: 1932-12-07, 80 y.o.   MRN: [id number] 2 Days Post-Op  Subjective: Pt extubated yesterday.  Denies abdominal pain.    Objective: Vital signs in last 24 hours: Temp:  [97 F (36.1 C)-99.1 F (37.3 C)] 99 F (37.2 C) (10/24 0720) Pulse Rate:  [40-82] 71 (10/24 0900) Resp:  [13-30] 18 (10/24 0900) BP: (83-136)/(33-84) 125/48 mmHg (10/24 0900) SpO2:  [87 %-100 %] 98 % (10/24 0900) Arterial Line BP: (116-127)/(33-40) 116/38 mmHg (10/23 1200) Weight:  [105 lb 13.1 oz (48 kg)] 105 lb 13.1 oz (48 kg) (10/24 0900) Last BM Date:  (pts)  Intake/Output from previous day: 10/23 0701 - 10/24 0700 In: 2355 [I.V.:965; IV Piggyback:150; TPN:1240] Out: 1879 [Emesis/NG output:1100] Intake/Output this shift: Total I/O In: 200 [I.V.:80; TPN:120] Out: -   PE: Gen:  A&O Resp:  Breathing comfortably. Abd: soft, skinny, absent BS, ostomy with serosang sweat, stoma is pink and viable.  No flatus in bag.   Lab Results:   Recent Labs  03/03/2014 0358 03/25/14 0845  WBC 20.3* 14.5*  HGB 13.7 10.5*  HCT 42.8 33.1*  PLT 70* 41*   BMET  Recent Labs  03/14/2014 1600 03/25/14 0442  NA 135* 136*  K 4.7 4.5  CL 101 101  CO2 21 23  GLUCOSE 105* 106*  BUN 31* 22  CREATININE 2.20* 1.48*  CALCIUM 7.2* 7.9*   PT/INR No results found for this basename: LABPROT, INR,  in the last 72 hours CMP     Component Value Date/Time   NA 136* 03/25/2014 0442   K 4.5 03/25/2014 0442   CL 101 03/25/2014 0442   CO2 23 03/25/2014 0442   GLUCOSE 106* 03/25/2014 0442   BUN 22 03/25/2014 0442   CREATININE 1.48* 03/25/2014 0442   CALCIUM 7.9* 03/25/2014 0442   PROT 5.1* 03/29/2014 0358   ALBUMIN 2.2* 03/25/2014 0442   AST 31 03/15/2014 0358   ALT 17 03/14/2014 0358   ALKPHOS 97 03/06/2014 0358   BILITOT 0.4 04/02/2014 0358   GFRNONAA 32* 03/25/2014 0442   GFRAA 38* 03/25/2014 0442   Lipase  No results found for this basename: lipase        Studies/Results: No results found.  Anti-infectives: Anti-infectives   Start     Dose/Rate Route Frequency Ordered Stop   03/25/14 2000  piperacillin-tazobactam (ZOSYN) IVPB 2.25 g     2.25 g 100 mL/hr over 30 Minutes Intravenous 3 times per day 03/25/14 1339     03/25/14 1345  piperacillin-tazobactam (ZOSYN) IVPB 2.25 g  Status:  Discontinued     2.25 g 100 mL/hr over 30 Minutes Intravenous 3 times per day 03/25/14 1335 03/25/14 1339   03/05/2014 1400  vancomycin (VANCOCIN) 500 mg in sodium chloride 0.9 % 100 mL IVPB  Status:  Discontinued     500 mg 100 mL/hr over 60 Minutes Intravenous Every 24 hours 03/23/14 1503 03/25/14 0856   03/23/14 2100  piperacillin-tazobactam (ZOSYN) IVPB 2.25 g  Status:  Discontinued     2.25 g 100 mL/hr over 30 Minutes Intravenous 4 times per day 03/23/14 1455 03/25/14 1335   03/23/14 0500  piperacillin-tazobactam (ZOSYN) IVPB 2.25 g  Status:  Discontinued     2.25 g 100 mL/hr over 30 Minutes Intravenous 3 times per day 04/01/2014 2242 03/23/14 1455   03/09/2014 2245  [MAR Hold]  vancomycin (VANCOCIN) IVPB 750 mg/150 ml premix     (On MAR Hold since 03/23/14 0020)   750  mg 150 mL/hr over 60 Minutes Intravenous  Once 03/17/2014 2241 03/23/14 0024       Assessment/Plan  1. POD 3,1 s/p ex lap with colon resection and subsequent liver biopsy and creation of colostomy with abdominal closure 2. Acute renal failure, on CRT, continues to improve 3. Septic shock, improving 4. VDRF- resolved  Plan: Patient looks very good today considering all she has been through.  Continue local wound care BID to abdominal wound  Routine ostomy care, not ready to have diet yet.   3. Cont abx therapy, will need at least 7 days post op.    LOS: 4 days    Cillian Gwinner 03/26/2014, 9:45 AM Pager: [phone]

## 2014-03-26 NOTE — Progress Notes (Addendum)
PARENTERAL NUTRITION CONSULT NOTE   Pharmacy Consult for TPN Indication: pneumoperitoneum with bowel resection  Allergies  Allergen Reactions  . Ivp Dye [Iodinated Diagnostic Agents] Swelling  . Lisinopril Swelling  . Iohexol Itching and Swelling        Patient Measurements: Height: 5\' 3"  (160 cm) Weight: 106 lb 0.7 oz (48.1 kg) IBW/kg (Calculated) : 52.4  Vital Signs: Temp: 99 F (37.2 C) (10/24 0720) Temp Source: Oral (10/24 0720) BP: 135/49 mmHg (10/24 0600) Pulse Rate: 47 (10/24 0600) Intake/Output from previous day: 10/23 0701 - 10/24 0700 In: 2255 [I.V.:925; IV Piggyback:150; TPN:1180] Out: 1879 [Emesis/NG output:1100] Intake/Output from this shift:    Labs:  Recent Labs  03/10/2014 0358 03/25/14 0442 03/25/14 0845  WBC 20.3*  --  14.5*  HGB 13.7  --  10.5*  HCT 42.8  --  33.1*  PLT 70*  --  41*  APTT 33 35  --      Recent Labs  03/22/2014 0358 03/18/2014 1600 03/25/14 0442  NA 139 135* 136*  K 5.1 4.7 4.5  CL 101 101 101  CO2 22 21 23   GLUCOSE 144* 105* 106*  BUN 43* 31* 22  CREATININE 3.25* 2.20* 1.48*  CALCIUM 7.3* 7.2* 7.9*  MG 1.8  --  2.0  PHOS 4.6 3.4 2.3  PROT 5.1*  --   --   ALBUMIN 2.2* 2.2* 2.2*  AST 31  --   --   ALT 17  --   --   ALKPHOS 97  --   --   BILITOT 0.4  --   --   PREALBUMIN 14.1*  --   --   TRIG 199*  --   --    Estimated Creatinine Clearance: 23.4 ml/min (by C-G formula based on Cr of 1.48).    Recent Labs  03/25/14 2350 03/26/14 0424 03/26/14 0716  GLUCAP 101* 102* 116*   Insulin Requirements in the past 24 hours:  None required  Current Nutrition:  Clinimix 5/15 at 2360mL/hr and 20% lipid emulsion at 7110ml/hr only on Wed. This will provide average of 72g protein and 1056 kcal.  Nutritional Goals:  1024kCal, 65-80 grams of protein per day per RD recommendations 10/21  (Re-estimated for patient on CRRT- 2000-2200kcal, 75-85g protein, however CRRT stopped 10/23 PM)  Admit: 81 YOF admitted 10/20 after  transfer from [facility] with perforated bowel- CT showed free air and thickened small bowel. She was taken to OR 10/20 PM for ex lap, application of open wound VAC and sigmoid colon resection.   GI: hx duodenal ulcers; POD#4 ex lap, sigmoid colon resection. Went back to OR 10/22 for re-exp, colostomy - also found to have several liver nodules suspicious for malignancy which were biopsied. Baseline prealbumin 14.1 (slightly low). NGT o/p 1100/24h. On IV PPI  Endo: hx hypothyroidism, TSH 1.6 on admit, continued on home Synthroid . No noted history of DM, no A1C on file. CBGs low-nl since starting TPN  Lytes: NO LYTES IN TPN; labs not drawn this morning- ordered stat  Renal: ESRD on HD MWF. Renal started CRRT 10/21 PM - tolerating. CRRT stopped 10/23 and planning for regular HD.  Pulm: Extubated- 96/2L [place].  Cards: CAD s/p CABG in 2008, HF with EF 25-30%, afib. Norepi requirements down to 4 mcg/min. HR ok  Hepatobil: LFTs remain WNL. Albumin low at 2.2. Baseline TG 199  Neuro: GCS 13. RASS -1 (goal 0) PRN fent  ID: Continues on Zosyn for bowel perf. Blood and urine cultures with  no growth to date, trach aspirate with few candida. WBC down to 14.5, currently afebrile.  Best Practices: subq hep, PPI IV  TPN Access: CVC double lumen left IJ placed 10/20  TPN day#: 3 (started 10/21)  Plan:  1. Continue Clinimix 5/15 (NO ELECTROLYTES) at 6660mL/hr. Will only give IVFE once per week (on Wed). This will provide average of 72g protein and 1056 kcal. 2. Multivitamin and trace elements in TPN 3. Change SSI and CBGs to q8h as these have remained very stable while on TPN 4. Maintenance fluids per CCM and Renal 5. Stat labs to determine if electrolyte replacement is needed; BMET, mag and phos in the morning  Trayquan Kolakowski D. Gianlucca Szymborski, PharmD, BCPS Clinical Pharmacist Pager: [phone] 03/26/2014 9:32 AM  ADDENDUM Noted patient has gone to HD. This morning's labs have finally resulted: low Na at 132, low K at  3.4, low Phos at 2, CorCa nml 9.6, Mag nml 2.1.  Plan: -give KPhos 20mmol IV x1 when  Patient returns from HD. This will provide 30mEq K.  Shineka Auble D. Kynzee Devinney, PharmD, BCPS Clinical Pharmacist Pager: [phone] 03/26/2014 2:04 PM

## 2014-03-26 NOTE — Progress Notes (Signed)
  [name] Progress Note   Subjective: no complaints  Filed Vitals:   03/26/14 0500 03/26/14 0600 03/26/14 0720 03/26/14 0810  BP: 129/84 135/49    Pulse: 55 47    Temp:   99 F (37.2 C)   TempSrc:   Oral   Resp: 17 19    Height:      Weight:      SpO2: 100% 100%  96%   Exam: Alert, no distress, nasal O2 No jvd  Chest scattered crackles bilat RRR 3/6 SEM no rub, distant heart sounds  Abd large midline vac system in place, dec'd BS, nondistended , LUQ ostomy No LE or UE edema  Neuro is alert, nonfocal   HD: MWF [facility] 3.5h   44.5kg   2/2.25 [place]  350/800  Heparin none  Profile 2 No meds  Assessment:  1 Ruptured sigmoid colon / ischemia bowel - s/p partial colectomy, colostomy 2 Septic shock - resolved 3 ESRD - s/p CRRT x 3 days 4 Hx CABG  5 Anemia Hb 13 6 Afib / hx CHB w PPM / hx CABG / CM EF 15-20%  7 Liver nodules - path is benign calcified fibrous tissue 8 DNR/DNI    Plan- HD today for volume, check CXR   Vinson Moselleob Cherika Jessie MD  pager [phone]    cell [phone]  03/26/2014, 9:05 AM     Recent Labs Lab 03/07/2014 0358 03/03/2014 1600 03/25/14 0442  NA 139 135* 136*  K 5.1 4.7 4.5  CL 101 101 101  CO2 22 21 23   GLUCOSE 144* 105* 106*  BUN 43* 31* 22  CREATININE 3.25* 2.20* 1.48*  CALCIUM 7.3* 7.2* 7.9*  PHOS 4.6 3.4 2.3    Recent Labs Lab 03/23/14 0448  03/13/2014 0358 03/27/2014 1600 03/25/14 0442  AST 19  --  31  --   --   ALT 15  --  17  --   --   ALKPHOS 96  --  97  --   --   BILITOT 0.5  --  0.4  --   --   PROT 4.8*  --  5.1*  --   --   ALBUMIN 2.6*  < > 2.2* 2.2* 2.2*  < > = values in this interval not displayed.  Recent Labs Lab 03/23/14 0448 03/15/2014 0358 03/25/14 0845  WBC 2.6* 20.3* 14.5*  NEUTROABS  --  19.3*  --   HGB 13.4 13.7 10.5*  HCT 43.2 42.8 33.1*  MCV 105.1* 102.6* 103.4*  PLT 110* 70* 41*   . antiseptic oral rinse  7 mL Mouth Rinse QID  . arformoterol  15 mcg Nebulization BID  . budesonide  (PULMICORT) nebulizer solution  0.5 mg Nebulization BID  . chlorhexidine  15 mL Mouth Rinse BID  . insulin aspart  0-9 Units Subcutaneous 6 times per day  . levothyroxine  75 mcg Intravenous Daily  . olopatadine  1 drop Both Eyes BID  . ondansetron (ZOFRAN) IV  4 mg Intravenous 3 times per day  . pantoprazole (PROTONIX) IV  40 mg Intravenous Daily  . piperacillin-tazobactam (ZOSYN)  IV  2.25 g Intravenous 3 times per day  . tiotropium  18 mcg Inhalation Daily   . sodium chloride 40 mL/hr (03/26/14 0200)  . norepinephrine (LEVOPHED) Adult infusion 4 mcg/min (03/25/14 0500)  . TPN (CLINIMIX) Adult without lytes 60 mL/hr at 03/25/14 1724   albuterol, fentaNYL, pramoxine-mineral oil-zinc

## 2014-03-26 NOTE — Progress Notes (Signed)
Received call from nurse on HD, Denyse AmassCorey. Patient became restless and removed NG tube. Pt given 0.5 mg Ativan x1. MD notified.

## 2014-03-26 NOTE — Progress Notes (Signed)
Received a call from radiology: pt's NGT is in her lung. NGT now removed.

## 2014-03-26 NOTE — Progress Notes (Signed)
PULMONARY / CRITICAL CARE MEDICINE   Name: [name] MRN: [id number] DOB: [birth year]    ADMISSION DATE:  Feb 15, 2014 CONSULTATION DATE:  03/26/2014  REFERRING MD :  Gaynelle AduEric Wilson, MD  CHIEF COMPLAINT:  Abdominal pain  INITIAL PRESENTATION: 80 y.o. female with history of CAD s/p CABG, systolic CHF, COPD, tobacco use, ESRD presenting with pneumoperitoneum. Difficult extubation expected postoperatively and PCCM was consulted.   STUDIES:  CT Abd 10/20 >> pneumoperitoneum with diffusely thickened bowel  SIGNIFICANT EVENTS: 10/20 Ex lap and removal of necrotic bowel; on neo 10/21 On levophed and neo 10/22 Back to OR, fascial closure. Finding of multiple liver nodules not previously noted. Liver biopsy obtained 10/22 Family conference. Pt made DNR in event of cardiac arrest.  10/23 Off vasopressors, extubated with adequate resp status post extubaiton 10/24: no issues. Ready to go to SDU   SUBJECTIVE:  Denies CP Breathing looks OK  VITAL SIGNS: Temp:  [97.5 F (36.4 C)-99.1 F (37.3 C)] 98.4 F (36.9 C) (10/24 1136) Pulse Rate:  [40-82] 71 (10/24 0900) Resp:  [14-30] 18 (10/24 0900) BP: (83-136)/(33-84) 125/48 mmHg (10/24 0900) SpO2:  [87 %-100 %] 98 % (10/24 0900) Arterial Line BP: (116)/(38) 116/38 mmHg (10/23 1200) Weight:  [48 kg (105 lb 13.1 oz)] 48 kg (105 lb 13.1 oz) (10/24 0900) 2 liters HEMODYNAMICS:   VENTILATOR SETTINGS:   INTAKE / OUTPUT:  Intake/Output Summary (Last 24 hours) at 03/26/14 1150 Last data filed at 03/26/14 0900  Gross per 24 hour  Intake   2230 ml  Output   1474 ml  Net    756 ml    PHYSICAL EXAMINATION: General: NAD Neuro: No focal deficits, confused at times  HEENT: WNL Cardiovascular: Regular, no murmur Lungs: Coarse breath sounds without focal consolidation  Abdomen: Wound vac with good seal in midline incision. ND.  Ext: No gross deformities. No edema. Skin: No rash, wound or ecchymosis noted.   LABS:  Recent Labs Lab  04/02/2014 0358 03/11/2014 1600 03/25/14 0442  NA 139 135* 136*  K 5.1 4.7 4.5  CL 101 101 101  CO2 22 21 23   BUN 43* 31* 22  CREATININE 3.25* 2.20* 1.48*  GLUCOSE 144* 105* 106*    Recent Labs Lab 03/23/14 0448 03/04/2014 0358 03/25/14 0845  HGB 13.4 13.7 10.5*  HCT 43.2 42.8 33.1*  WBC 2.6* 20.3* 14.5*  PLT 110* 70* 41*     CXR: NNF  ASSESSMENT / PLAN:  PULMONARY OETT 10/21>>>10/23 A:  Acute on chronic respiratory failure  Severe COPD Ongoing tobacco use P:   Supp O2 to maintain SpO2 > 92% Cont nebulized steroids and BDs Airway hygiene PRN BiPAP  CARDIOVASCULAR L IJ 10/21 >>> A-line 10/21 >> 10/23 A:  Septic shock - resolved Chronic systolic CHF, compensated CAD s/p 3v CABG History of atrial fibrillation History of complete heart block with pacemaker P:  MAP goal 55 mmHg   RENAL A:  ESRD (HD on MWF) P:   To start regular HD Monitor BMET intermittently Monitor I/Os Correct electrolytes as indicated  GASTROINTESTINAL A:  Perforated viscus Intestinal ischemia Post laparotomy, sigmoid colectomy Liver nodules - concern for metastatic disease P:   Post op mgmt per CCS NPO, TPN Cont NGT to LIS Await liver biopsy results (colon pathology negative for malignancy) SUP: PPI IV  HEMATOLOGIC A:   Anemia of CKD Thrombocytopenia - holding heparin P: Follow CBC SCDs  INFECTIOUS A:   Severe sepsis Peritonitis History of bacterial endocarditis July 2015 P:  resp culture 10/21 >> Few C. albicans BCx2 10/21 >> ng UC 10/21 >> Neg  Abx: Vancomycin 10/20 >> 10/23 Abx: Zosyn 10/21 >> (plan x 7d)   ENDOCRINE A:   Hypothyroidism  Risk of hyperglycemia on TPN P:   IV levothyroxine @ 1/2 home PO dose Sens scale SSI  NEUROLOGIC A:   Post op pain P:   PRN fentanyl   Family updated: 10/24   Interdisciplinary Family Meeting v Palliative Care Meeting:    TODAY'S SUMMARY: Extubated, DNR established, await liver bx results, needs  colostomy care, HD planned today  ready for transfer to SDU & to triad 10/25  Cyril Mourningakesh Marquest Gunkel MD. [facility]P. [facility] Pager [phone] If no response call [phone]     03/26/2014 11:50 AM

## 2014-03-26 NOTE — Progress Notes (Signed)
Patient off unit for HD. E-link notified.

## 2014-03-26 NOTE — Progress Notes (Signed)
Patient has been agitated while on hemodialysis. Patient sliding legs over side rails attempting to get out of bed. Dr Arlean HoppingSchertz called and ordered 0.5 mg Ativan IV. Patient given 0.5 mg Ativan at 1400 . Patient observed with eyes closed and resting. At 1410 patient observed with suction completely removed from nasal passageway and right hand mitten removed . Johann CapersLatanya RN has been notified on 67M.  Jetty DuhamelBridget Whelan NP has been made aware.

## 2014-03-26 NOTE — Progress Notes (Signed)
ANTIBIOTIC CONSULT NOTE - Follow-up  Pharmacy Consult for vancomycin, zosyn Indication: bowel perf  Allergies  Allergen Reactions  . Ivp Dye [Iodinated Diagnostic Agents] Swelling  . Lisinopril Swelling  . Iohexol Itching and Swelling         Patient Measurements: Height: 5\' 3"  (160 cm) Weight: 105 lb 13.1 oz (48 kg) IBW/kg (Calculated) : 52.4  Vital Signs: Temp: 98.4 F (36.9 C) (10/24 1136) Temp Source: Oral (10/24 1136) BP: 125/48 mmHg (10/24 0900) Pulse Rate: 71 (10/24 0900) Intake/Output from previous day: 10/23 0701 - 10/24 0700 In: 2355 [I.V.:965; IV Piggyback:150; TPN:1240] Out: 1879 [Emesis/NG output:1100] Intake/Output from this shift: Total I/O In: 200 [I.V.:80; TPN:120] Out: -   Labs:  Recent Labs  12/19/2013 0358 12/19/2013 1600 03/25/14 0442 03/25/14 0845  WBC 20.3*  --   --  14.5*  HGB 13.7  --   --  10.5*  PLT 70*  --   --  41*  CREATININE 3.25* 2.20* 1.48*  --    Estimated Creatinine Clearance: 23.4 ml/min (by C-G formula based on Cr of 1.48). No results found for this basename: VANCOTROUGH, Leodis BinetVANCOPEAK, VANCORANDOM, GENTTROUGH, GENTPEAK, GENTRANDOM, TOBRATROUGH, TOBRAPEAK, TOBRARND, AMIKACINPEAK, AMIKACINTROU, AMIKACIN,  in the last 72 hours   Assessment: 80 yo female on Vancomycin and Zosyn Day#4 for bowel perf - s/p OR 10/21 for sigmoid colon resection - plan back to OR 10/22 for re-exploration, resection/colostomy, and liver biopsy. Improving clinically, Afeb. WBC 14.5. Pt ESRD (M/W/F as o/p). S/p CRRT x 3 days, now transitioned back to HD, plan for HD today.  10/20 vanc> 10/23 10/20 zosyn >  10/21 Urine>> neg 10/21 Bld x2>> ngtd 10/21 Trach aspirate >> few candida albicans MRSA PCR neg  Goal of Therapy:  Resolution of infection  Plan:  -Zosyn changed to 2.25 g IV q8h -F/u HD schedule and tolerance -Monitor cultures, pt's clinical condition  Bayard HuggerMei Riyan Haile, PharmD, BCPS  Clinical Pharmacist  Pager: [phone]   03/26/2014 11:44 AM

## 2014-03-27 DIAGNOSIS — J42 Unspecified chronic bronchitis: Secondary | ICD-10-CM

## 2014-03-27 LAB — BASIC METABOLIC PANEL
Anion gap: 11 (ref 5–15)
BUN: 34 mg/dL — AB (ref 6–23)
CHLORIDE: 95 meq/L — AB (ref 96–112)
CO2: 27 meq/L (ref 19–32)
Calcium: 8.1 mg/dL — ABNORMAL LOW (ref 8.4–10.5)
Creatinine, Ser: 1.87 mg/dL — ABNORMAL HIGH (ref 0.50–1.10)
GFR calc Af Amer: 28 mL/min — ABNORMAL LOW (ref >90)
GFR, EST NON AFRICAN AMERICAN: 24 mL/min — AB (ref >90)
GLUCOSE: 108 mg/dL — AB (ref 70–99)
POTASSIUM: 3.7 meq/L (ref 3.7–5.3)
Sodium: 133 mEq/L — ABNORMAL LOW (ref 137–147)

## 2014-03-27 LAB — PHOSPHORUS: Phosphorus: 2.3 mg/dL (ref 2.3–4.6)

## 2014-03-27 LAB — GLUCOSE, CAPILLARY
GLUCOSE-CAPILLARY: 131 mg/dL — AB (ref 70–99)
GLUCOSE-CAPILLARY: 100 mg/dL — AB (ref 70–99)
Glucose-Capillary: 103 mg/dL — ABNORMAL HIGH (ref 70–99)

## 2014-03-27 LAB — MAGNESIUM: MAGNESIUM: 1.7 mg/dL (ref 1.5–2.5)

## 2014-03-27 MED ORDER — TRACE MINERALS CR-CU-F-FE-I-MN-MO-SE-ZN IV SOLN
INTRAVENOUS | Status: DC
  Filled 2014-03-27: qty 2000

## 2014-03-27 MED ORDER — HEPARIN SODIUM (PORCINE) 1000 UNIT/ML DIALYSIS: 1000.0000 [IU] | INTRAMUSCULAR | Status: DC | PRN

## 2014-03-27 MED ORDER — LIDOCAINE-PRILOCAINE 2.5-2.5 % EX CREA: 1.0000 "application " | TOPICAL_CREAM | CUTANEOUS | Status: DC | PRN

## 2014-03-27 MED ORDER — SODIUM CHLORIDE 0.9 % IV SOLN: 100.0000 mL | INTRAVENOUS | Status: DC | PRN

## 2014-03-27 MED ORDER — LIDOCAINE HCL (PF) 1 % IJ SOLN: 5.0000 mL | INTRAMUSCULAR | Status: DC | PRN

## 2014-03-27 MED ORDER — NEPRO/CARBSTEADY PO LIQD: 237.0000 mL | ORAL | Status: DC | PRN

## 2014-03-27 MED ORDER — ALTEPLASE 2 MG IJ SOLR: 2.0000 mg | Freq: Once | INTRAMUSCULAR | Status: DC | PRN

## 2014-03-27 MED ORDER — TRACE MINERALS CR-CU-F-FE-I-MN-MO-SE-ZN IV SOLN
INTRAVENOUS | Status: AC
  Administered 2014-03-27: 18:00:00 via INTRAVENOUS
  Filled 2014-03-27: qty 1000

## 2014-03-27 MED ORDER — PENTAFLUOROPROP-TETRAFLUOROETH EX AERO: 1.0000 "application " | INHALATION_SPRAY | CUTANEOUS | Status: DC | PRN

## 2014-03-27 NOTE — Evaluation (Signed)
Clinical/Bedside Swallow Evaluation Patient Details  Name: [name] MRN: [id number] Date of Birth: 1933/04/29  Today's Date: 03/27/2014 Time: 1204-1220 SLP Time Calculation (min): 16 min  Past Medical History:  Past Medical History  Diagnosis Date  . Carotid artery occlusion     Bilateral carotid artery disease  . Peripheral artery disease   . CHF (congestive heart failure)     EF 15-20% on TEE 12/15/13  . Hypertension   . Hyperlipidemia   . ESRD on hemodialysis 06/20/11    M, W, F; [address], [place].  Started HD in Sept 2008  . AAA (abdominal aortic aneurysm)   . Stroke 2007  . Duodenal ulcer 2008  . S/P CABG (coronary artery bypass graft)     CABG in 2008, had 3 MI's prior  . Depression   . Pneumonia   . COPD, severe   . Hypothyroidism   . Anemia   . Pacemaker   . Atrial fibrillation   . Complete heart block    Past Surgical History:  Past Surgical History  Procedure Laterality Date  . Thyroid surgery  2004  . Dg av dialysis  shunt access exist*l* or  03/2000    left upper arm  . Vaginal hysterectomy  1976  . Hemorrhoid surgery  1971  . Insert / replace / remove pacemaker  1990's    initial placement  . Insert / replace / remove pacemaker  05/2011    MDT ADDRL1 pacemaker implanted by Dr Dulce SellarMunley  . Incisional hernia repair  10/2010    incarcerated  . Common iliac  06/2009    bilateral kissing stents  . Pseudoaneurysm repair  09/1999    right groin  . Av fistula repair  04/2007    left upper arm  . Cardiac catheterization  02/10, 03/12    Most recent showed 3 vessel CAD with patent grafts: SVG to LAD, SVG to PDA and SVG to OM  . Coronary angioplasty with stent placement  09/1999    "1"  . Coronary artery bypass graft  02/2007    CABG X3  . Tee without cardioversion N/A 12/02/2013    Procedure: TRANSESOPHAGEAL ECHOCARDIOGRAM (TEE);  Surgeon: Lewayne BuntingBrian S Crenshaw, MD;  [facility];  Service: Cardiovascular;  Laterality: N/A;  . Laparotomy N/A 03/21/2014     Procedure: EXPLORATORY LAPAROTOMY;  Surgeon: Atilano InaEric M Wilson, MD;  [facility];  Service: General;  Laterality: N/A;  . Application of wound vac N/A 03/31/2014    Procedure: APPLICATION OF OPEN WOUND VAC;  Surgeon: Atilano InaEric M Wilson, MD;  [facility];  Service: General;  Laterality: N/A;  . Colostomy revision N/A 03/03/2014    Procedure: COLON RESECTION SIGMOID;  Surgeon: Atilano InaEric M Wilson, MD;  [facility];  Service: General;  Laterality: N/A;  . Laparotomy N/A 03/18/2014    Procedure: EXPLORATORY LAPAROTOMY;  Surgeon: Abigail Miyamotoouglas Blackman, MD;  [facility];  Service: General;  Laterality: N/A;  . Colostomy N/A 03/09/2014    Procedure: COLOSTOMY;  Surgeon: Abigail Miyamotoouglas Blackman, MD;  [facility];  Service: General;  Laterality: N/A;   HPI:  80 y.o. female with history of CAD s/p CABG, systolic CHF, COPD, tobacco use, ESRD presenting with pneumoperitoneum.  Intubated 10/21-10/23;  10/20 s/p ex lap with colon resection and subsequent liver biopsy and creation of colostomy with abdominal closure. Acute renal failure on CKD , on CRT, continues to improve. Septic shock, improving.  Currently NPO with TPN.   Assessment / Plan / Recommendation  Clinical Impression  Pt presents with a likely acute reversible dysphagia secondary to deconditioning, intubation.  Limited assessment today due to fatiguability.  Pt with weak mastication, likely delayed swallow response, and wet-sounding phonation with cough post-swallow.  Voice is hoarse with low volume;  respiratory and swallow coordination appear to be compromised.  Pt currently a high-aspiration risk.  Recommend continued NPO with alternative nutrition; SLP will follow for readiness/safety for PO consumption.  Family present; agree with plan.      Aspiration Risk  Severe    Diet Recommendation NPO;Alternative means - temporary        Other  Recommendations Oral Care Recommendations:  (QD)   Follow Up Recommendations   (tba)    Frequency and  Duration min 3x week  2 weeks    Swallow Study Prior Functional Status       General Date of Onset: 03/10/2014 HPI: 80 y.o. female with history of CAD s/p CABG, systolic CHF, COPD, tobacco use, ESRD presenting with pneumoperitoneum.  Intubated 10/21-10/23;  10/20 s/p ex lap with colon resection and subsequent liver biopsy and creation of colostomy with abdominal closure. Acute renal failure on CKD , on CRT, continues to improve. Septic shock, improving.  Currently NPO with TPN. Type of Study: Bedside swallow evaluation Previous Swallow Assessment: none per records Diet Prior to this Study: NPO (TPN) Temperature Spikes Noted: No Respiratory Status: Nasal cannula History of Recent Intubation: Yes Length of Intubations (days): 2 days Date extubated: 03/25/14 Behavior/Cognition: Alert Oral Cavity - Dentition: Edentulous Self-Feeding Abilities: Total assist Patient Positioning: Upright in bed Baseline Vocal Quality: Hoarse;Low vocal intensity Volitional Cough: Weak Volitional Swallow: Able to elicit    Oral/Motor/Sensory Function Overall Oral Motor/Sensory Function: Appears within functional limits for tasks assessed   Ice Chips Ice chips: Impaired Presentation: Spoon Oral Phase Functional Implications: Prolonged oral transit Pharyngeal Phase Impairments: Suspected delayed Swallow;Wet Vocal Quality;Throat Clearing - Immediate;Cough - Delayed   Thin Liquid Thin Liquid: Not tested    Nectar Thick Nectar Thick Liquid: Not tested   Honey Thick Honey Thick Liquid: Not tested   Puree Puree: Not tested   Solid  [name], [place]MA CCC/SLP Pager [phone]     Solid: Not tested       [name], [name] 03/27/2014,12:24 PM

## 2014-03-27 NOTE — Progress Notes (Addendum)
PARENTERAL NUTRITION CONSULT NOTE   Pharmacy Consult for TPN Indication: pneumoperitoneum with bowel resection  Allergies  Allergen Reactions  . Ivp Dye [Iodinated Diagnostic Agents] Swelling  . Lisinopril Swelling  . Iohexol Itching and Swelling        Patient Measurements: Height: 5\' 3"  (160 cm) Weight: 101 lb 13.6 oz (46.2 kg) IBW/kg (Calculated) : 52.4  Vital Signs: Temp: 96.3 F (35.7 C) (10/24 2335) Temp Source: Axillary (10/24 2335) BP: 166/60 mmHg (10/24 2335) Pulse Rate: 83 (10/24 2335) Intake/Output from previous day: 10/24 0701 - 10/25 0700 In: 1306.7 [I.V.:320; IV Piggyback:506.7; TPN:480] Out: 2500  Intake/Output from this shift:    Labs:  Recent Labs  03/25/14 0442 03/25/14 0845  WBC  --  14.5*  HGB  --  10.5*  HCT  --  33.1*  PLT  --  41*  APTT 35  --      Recent Labs  June 19, 2013 1600 03/25/14 0442 03/26/14 0932  NA 135* 136* 132*  K 4.7 4.5 3.4*  CL 101 101 94*  CO2 21 23 27   GLUCOSE 105* 106* 112*  BUN 31* 22 43*  CREATININE 2.20* 1.48* 2.22*  CALCIUM 7.2* 7.9* 8.3*  MG  --  2.0 2.1  PHOS 3.4 2.3 2.0*  ALBUMIN 2.2* 2.2*  --    Estimated Creatinine Clearance: 15 ml/min (by C-G formula based on Cr of 2.22).    Recent Labs  03/26/14 1104 03/26/14 2305 03/27/14 0658  GLUCAP 123* 127* 103*   Insulin Requirements in the past 24 hours:  1 unit SSI  Current Nutrition:  Clinimix 5/15 at 8060mL/hr and 20% lipid emulsion at 2710ml/hr only on Wed. This will provide average of 72g protein and 1056 kcal.  Nutritional Goals:  1024kCal, 65-80 grams of protein per day per RD recommendations 10/21  (Re-estimated for patient on CRRT- 2000-2200kcal, 75-85g protein, however CRRT stopped 10/23 PM)  Admit: [name] admitted 10/20 after transfer from [place] with perforated bowel- CT showed free air and thickened small bowel. She was taken to OR 10/20 PM for ex lap, application of open wound VAC and sigmoid colon resection.   GI: hx duodenal  ulcers; POD#5 ex lap, sigmoid colon resection. Went back to OR 10/22 for re-exp, colostomy - also found to have several liver nodules suspicious for malignancy which were biopsied. Baseline prealbumin 14.1 (slightly low). NGT now out as patient displaced during agitation while on HD yesterday. On IV PPI  Endo: hx hypothyroidism, TSH 1.6 on admit, continued on home Synthroid . No noted history of DM, no A1C on file. CBGs low-nl since starting TPN  Lytes: NO LYTES IN TPN; Na/Cl low, K nml at 3.7 after repletion yesterday, Mag 1.7, Phos 2.3, also normal after yesterday's repletion, CorCa 9.4  Renal: ESRD on HD MWF. Renal started CRRT 10/21 PM, then stopped 10/23. Now undergoing iHD- last session 10/24- tolerated 3 hr at BFR 400- noted she became agitated during the session  Pulm: Extubated- 100/2L [place].  Cards: CAD s/p CABG in 2008, HF with EF 25-30%, afib. Not requiring pressors, HR brady-nml, BP soft-nml  Hepatobil: LFTs remain WNL. Albumin low at 2.2. Baseline TG 199  Neuro: GCS 13. RASS -1 (goal 0) PRN fent  ID: Continues on Zosyn for bowel perf. Blood and urine cultures with no growth to date, trach aspirate with few candida. WBC down to 14.5, currently afebrile.  Best Practices: subq hep, PPI IV  TPN Access: CVC double lumen left IJ placed 10/20 TPN day#: 4 (  started 10/21)  Plan:  1. Reduce Clinimix 5/15 (NO ELECTROLYTES) to 6530mL/hr per renal MD request. Will only give IVFE once per week (on Wed). This will 36g protein daily and an average of 579 kcal/day- cannot concentrate TPN any further to provide more nutrients without providing excess fluid 2. Multivitamin and trace elements in TPN 3. Continue SSI and CBGs as q8h 4. Maintenance fluids per CCM and Renal 5. Continue IV PPI 6. TPN labs as ordered 7. Will update nutritional goals as recommended by RD now that patient is not on CRRT  Madysyn Hanken D. Caryl Fate, PharmD, BCPS Clinical Pharmacist Pager: [phone] 03/27/2014 7:38 AM

## 2014-03-27 NOTE — Progress Notes (Signed)
Pt OOB to wheelchair today. Pt requested to get out, so she was taken to the front entrance for a brief moment.

## 2014-03-27 NOTE — Progress Notes (Signed)
[facility] Progress Note   Subjective: confused, on TNA at 60 /hr, coughing . 2.5 kg off w hd yest w high BP's throughout  Filed Vitals:   03/27/14 0200 03/27/14 0600 03/27/14 0818 03/27/14 0922  BP: 146/70 129/51 147/51   Pulse:  76 80   Temp:   97.9 F (36.6 C)   TempSrc:   Axillary   Resp:  21 28   Height:      Weight:      SpO2:  100% 100% 100%   Exam: Alert, no distress, nasal O2, hands in mitts No jvd  Chest scattered rales and diffuse mild exp wheezing bilat RRR 3/6 SEM no rub, distant heart sounds  Abd dressing in place,  LUQ ostomy No LE or UE edema  Neuro is alert, nonfocal, confused   HD: MWF [place] 3.5h   44.5kg   2/2.25 [place]  350/800  Heparin none  Profile 2 No meds  Assessment:  1 Ruptured sigmoid colon / ischemia bowel - s/p partial colectomy, colostomy 2 ESRD - s/p CRRT x 3 days 3 Anemia Hb 10.5, no esa unless drops lower, check Fe 4 Nutrition - on TNA at 60 cc/hr 5 HTPH resume sensipar when eating, no binders P in range 6 Cardiac - hx Afib / PPM / hx CABG / CM EF 15-20%  7 DNR/DNI   Plan- extra HD tomorrow for volume; TNA volume will be excessive for her with her lung and heart disease, recommend lower rate to 30-40 cc/hr max and use gut asap please. Have d/w primary MD   Vinson Moselleob Kloe Oates MD  pager [phone]    cell [phone]  03/27/2014, 10:05 AM     Recent Labs Lab 03/25/14 0442 03/26/14 0932 03/27/14 0500  NA 136* 132* 133*  K 4.5 3.4* 3.7  CL 101 94* 95*  CO2 23 27 27   GLUCOSE 106* 112* 108*  BUN 22 43* 34*  CREATININE 1.48* 2.22* 1.87*  CALCIUM 7.9* 8.3* 8.1*  PHOS 2.3 2.0* 2.3    Recent Labs Lab 03/23/14 0448  03/05/2014 0358 03/04/2014 1600 03/25/14 0442  AST 19  --  31  --   --   ALT 15  --  17  --   --   ALKPHOS 96  --  97  --   --   BILITOT 0.5  --  0.4  --   --   PROT 4.8*  --  5.1*  --   --   ALBUMIN 2.6*  < > 2.2* 2.2* 2.2*  < > = values in this interval not displayed.  Recent Labs Lab 03/23/14 0448  03/14/2014 0358 03/25/14 0845  WBC 2.6* 20.3* 14.5*  NEUTROABS  --  19.3*  --   HGB 13.4 13.7 10.5*  HCT 43.2 42.8 33.1*  MCV 105.1* 102.6* 103.4*  PLT 110* 70* 41*   . antiseptic oral rinse  7 mL Mouth Rinse QID  . arformoterol  15 mcg Nebulization BID  . budesonide (PULMICORT) nebulizer solution  0.5 mg Nebulization BID  . chlorhexidine  15 mL Mouth Rinse BID  . insulin aspart  0-9 Units Subcutaneous 3 times per day  . levothyroxine  75 mcg Intravenous Daily  . olopatadine  1 drop Both Eyes BID  . ondansetron (ZOFRAN) IV  4 mg Intravenous 3 times per day  . pantoprazole (PROTONIX) IV  40 mg Intravenous Daily  . piperacillin-tazobactam (ZOSYN)  IV  2.25 g Intravenous 3 times per day  . tiotropium  18  mcg Inhalation Daily   . sodium chloride 40 mL/hr at 03/26/14 1744  . TPN (CLINIMIX) Adult without lytes 60 mL/hr at 03/26/14 2004  . TPN (CLINIMIX) Adult without lytes     sodium chloride, sodium chloride, albuterol, alteplase, fentaNYL, heparin, lidocaine (PF), lidocaine-prilocaine, pentafluoroprop-tetrafluoroeth, pramoxine-mineral oil-zinc, sodium chloride

## 2014-03-27 NOTE — Progress Notes (Signed)
Patient ID: [name], female   DOB: 07-30-1932, 80 y.o.   MRN: [id number]  [facility] PROGRESS NOTE  [name] [id number] DOB: 07-30-1932 DOA: 03/09/2014 PCP: Galvin ProfferHAGUE, IMRAN P, MD  Brief narrative: 80 y.o. female with history of CAD s/p CABG, systolic CHF, COPD, tobacco use, ESRD presented with AMS, dyspnea and found with pneumoperitoneum. Difficult extubation expected postoperatively and PCCM was consulted.   SIGNIFICANT EVENTS:  10/20 Ex lap and removal of necrotic bowel; on neo  10/21 On levophed and neo  10/22 Back to OR, fascial closure. Finding of multiple liver nodules not previously noted. Liver biopsy obtained  10/22 Family conference. Pt made DNR in event of cardiac arrest.  10/23 Off vasopressors, extubated with adequate resp status post extubaiton  10/24 Stable for tx to SDU  10/25 [facility] assumed care  Assessment and Plan:   Acute on chronic respiratory failure, post operative VDRF - in pt with severe COPD and ongoing tobacco abuse  - OETT 10/21>>>10/23  - pt on oxygen via [place], maintaining oxygen saturations at target range  - Cont nebulized steroids and BD's, provide BiPAP as needed - Add Flutter/Teach brace abd w pillow to cough  Severe sepsis and septic shock, secondary to peritonitis  - resp culture 10/21 >> Few C. albicans  - BCx2 10/21 >> ng  - UC 10/21 >> Neg  - Abx: Vancomycin 10/20 >> 10/23  - Abx: Zosyn 10/21 >> (plan x 7d), stop 10/27 Chronic systolic CHF, compensated  - HD in AM for volume control - lower the rate of TNA as noted below  History of atrial fibrillation  - rate controlled, no events on telemetry  - in NSR this AM 10/25 History of complete heart block with pacemaker  - CAD s/p 3v CABG - MAP goal 55 mmHg  ESRD (HD on MWF)  - appreciate renal team input - plan for extra HD in AM for volume  - per nephrology team: TNA volume excessive for pt, recommend lower rate at 30-40 cc/hr max and use gut asap - order placed  Perforated  viscus with Intestinal ischemia  - Post laparotomy, sigmoid colectomy and liver biopsy, creation of colostomy with abdominal closure  - post op day #4, clinically stable this AM - Aggressive pulm toilet, PT/OT - NPO, await bowel function, TPN  - Cont zosyn for total 7 days given stool contamination  - Wet-dressing bid to lower midline incision - surgery following  Liver nodules  - concern for metastatic disease, biopsy pending  - colon pathology negative for malignancy   Anemia of CKD  - no signs of active bleeding - repeat CBC in AM Thrombocytopenia - continue holding Heparin products - no CBC this AM, order for tomorrow AM - SCD's for DVT prophylaxis  Hypothyroidism  - IV levothyroxine @ 1/2 home PO dose  Severe PCM - secondary to acute illness with complications outlined above - NPO for now per surgery  Acute functional quadriplegia - secondary to acute illness as noted above - PT/OT evaluation   DVT prophylaxis  SCD's  Code Status: DNR Family Communication: No family at bedside  Disposition Plan: Keep in SDU   IV Access:    L IJ 10/21 >>>   A-line 10/21 >> 10/23  Procedures and diagnostic studies:    CXR  03/26/2014  Enlargement of cardiac silhouette post CABG and pacemaker.  Question minimal pulmonary edema.    Dg Abd Portable 1v  03/26/2014  NG tube in the left lung with  the tip in the left lung base.  03/23/2014 Ex Lap, sigmoid colon resection, application of open wound vac done by Dr. Gaynelle AduEric Wilson  Medical Consultants:    Nephrology  Surgery  Other Consultants:    PT/OT Anti-Infectives:    Zosyn 10/21 --> 10/27  Debbora PrestoMAGICK-Haneen Bernales, MD  [facility] Pager [phone]  If 7PM-7AM, please contact night-coverage [email or web address] Password [email or web address] 03/27/2014, 12:57 PM   LOS: 5 days   HPI/Subjective: No events overnight.   Objective: Filed Vitals:   03/27/14 0600 03/27/14 0818 03/27/14 0922 03/27/14 1248  BP: 129/51 147/51  146/55  Pulse: 76 80  77  Temp:   97.9 F (36.6 C)  98.3 F (36.8 C)  TempSrc:  Axillary  Oral  Resp: 21 28  26   Height:      Weight:      SpO2: 100% 100% 100% 91%    Intake/Output Summary (Last 24 hours) at 03/27/14 1257 Last data filed at 03/27/14 0800  Gross per 24 hour  Intake 1921.67 ml  Output   2500 ml  Net -578.33 ml    Exam:   General:  Pt is sleeping, easy to arouse, confused, NAD, no verbal   Cardiovascular: Regular rate and rhythm, no rubs, no gallops  Respiratory: Course breath sounds bilaterally with very poor air movement at bases   Abdomen: Soft, non tender, wound vac with good seal in midline incision  Extremities: No edema, pulses DP and PT palpable bilaterally  Data Reviewed: Basic Metabolic Panel:  Recent Labs Lab 03/23/14 0448  03/05/2014 0358 03/16/2014 1600 03/25/14 0442 03/26/14 0932 03/27/14 0500  NA 143  < > 139 135* 136* 132* 133*  K 4.5  < > 5.1 4.7 4.5 3.4* 3.7  CL 104  < > 101 101 101 94* 95*  CO2 19  < > 22 21 23 27 27   GLUCOSE 127*  < > 144* 105* 106* 112* 108*  BUN 73*  < > 43* 31* 22 43* 34*  CREATININE 5.30*  < > 3.25* 2.20* 1.48* 2.22* 1.87*  CALCIUM 7.3*  < > 7.3* 7.2* 7.9* 8.3* 8.1*  MG 1.5  --  1.8  --  2.0 2.1 1.7  PHOS  --   < > 4.6 3.4 2.3 2.0* 2.3  < > = values in this interval not displayed. Liver Function Tests:  Recent Labs Lab 03/23/14 0448 03/23/14 2320 03/25/2014 0358 03/13/2014 1600 03/25/14 0442  AST 19  --  31  --   --   ALT 15  --  17  --   --   ALKPHOS 96  --  97  --   --   BILITOT 0.5  --  0.4  --   --   PROT 4.8*  --  5.1*  --   --   ALBUMIN 2.6* 2.4* 2.2* 2.2* 2.2*   CBC:  Recent Labs Lab 03/23/14 0106 03/23/14 0448 03/11/2014 0358 03/25/14 0845  WBC  --  2.6* 20.3* 14.5*  NEUTROABS  --   --  19.3*  --   HGB 13.9 13.4 13.7 10.5*  HCT 41.0 43.2 42.8 33.1*  MCV  --  105.1* 102.6* 103.4*  PLT  --  110* 70* 41*   Cardiac Enzymes:  Recent Labs Lab 03/21/2014 2257 03/23/14 0448 03/23/14 1045  TROPONINI <0.30 <0.30 <0.30    CBG:  Recent Labs Lab 03/26/14 0424 03/26/14 0716 03/26/14 1104 03/26/14 2305 03/27/14 0658  GLUCAP 102* 116* 123* 127* 103*   Scheduled Meds: . arformoterol  15 mcg Nebulization BID  . budesonide  nebulizer   0.5 mg Nebulization BID  . insulin aspart  0-9 Units Subcutaneous 3 times per day  . levothyroxine  75 mcg Intravenous Daily  . olopatadine  1 drop Both Eyes BID  . ondansetron  IV  4 mg Intravenous 3 times per day  . pantoprazole  IV  40 mg Intravenous Daily  . ZOSYN IV  2.25 g Intravenous 3 times per day  . tiotropium  18 mcg Inhalation Daily   Continuous Infusions: . sodium chloride 20 mL/hr at 03/27/14 1023  . TPN (CLINIMIX) Adult without lytes 60 mL/hr at 03/26/14 2004  . TPN (CLINIMIX) Adult without lytes

## 2014-03-27 NOTE — Progress Notes (Signed)
PULMONARY / CRITICAL CARE MEDICINE   Name: Maria LefevreVera M Moffitt MRN: [id number] DOB: [birth year]    ADMISSION DATE:  03/15/2014 CONSULTATION DATE:  03/27/2014  REFERRING MD :  Gaynelle AduEric Wilson, MD  CHIEF COMPLAINT:  Abdominal pain  INITIAL PRESENTATION: 80 y.o. female with history of CAD s/p CABG, systolic CHF, COPD, tobacco use, ESRD presenting with pneumoperitoneum. Difficult extubation expected postoperatively and PCCM was consulted.   STUDIES:  CT Abd 10/20 >> pneumoperitoneum with diffusely thickened bowel  SIGNIFICANT EVENTS: 10/20 Ex lap and removal of necrotic bowel; on neo 10/21 On levophed and neo 10/22 Back to OR, fascial closure. Finding of multiple liver nodules not previously noted. Liver biopsy obtained 10/22 Family conference. Pt made DNR in event of cardiac arrest.  10/23 Off vasopressors, extubated with adequate resp status post extubaiton 10/24: no issues. Ready to go to SDU   SUBJECTIVE: Patient asleep and not disturbed. Extended family in room. They note rattle breathing whiochg did not clear as usual w dialysis yesterday. They report abdominal pain when she tries to cough.   VITAL SIGNS: Temp:  [96.3 F (35.7 C)-98.9 F (37.2 C)] 97.9 F (36.6 C) (10/25 0818) Pulse Rate:  [75-88] 80 (10/25 0818) Resp:  [15-29] 28 (10/25 0818) BP: (123-166)/(48-70) 147/51 mmHg (10/25 0818) SpO2:  [95 %-100 %] 100 % (10/25 0922) Weight:  [46.2 kg (101 lb 13.6 oz)-48.1 kg (106 lb 0.7 oz)] 46.2 kg (101 lb 13.6 oz) (10/24 1636) 2 liters HEMODYNAMICS:   VENTILATOR SETTINGS:   INTAKE / OUTPUT:  Intake/Output Summary (Last 24 hours) at 03/27/14 1139 Last data filed at 03/27/14 0800  Gross per 24 hour  Intake 2021.67 ml  Output   2500 ml  Net -478.33 ml    PHYSICAL EXAMINATION: General: NAD Neuro: Sleeping soundly. Did not wake to light touch  HEENT: NP O2 Cardiovascular: Regular, no murmur Lungs: Very coarse breath sounds / upper airway Abdomen: Wound vac with good seal in  midline incision. ND.  Ext: No gross deformities. No edema. Skin: No rash, wound or ecchymosis noted.   LABS:  Recent Labs Lab 03/25/14 0442 03/26/14 0932 03/27/14 0500  NA 136* 132* 133*  K 4.5 3.4* 3.7  CL 101 94* 95*  CO2 23 27 27   BUN 22 43* 34*  CREATININE 1.48* 2.22* 1.87*  GLUCOSE 106* 112* 108*    Recent Labs Lab 03/23/14 0448 03/23/2014 0358 03/25/14 0845  HGB 13.4 13.7 10.5*  HCT 43.2 42.8 33.1*  WBC 2.6* 20.3* 14.5*  PLT 110* 70* 41*     CXR: NNF  ASSESSMENT / PLAN:  PULMONARY OETT 10/21>>>10/23 A:  Acute on chronic respiratory failure  Severe COPD Ongoing tobacco use P:   Supp O2 to maintain SpO2 > 92% Cont nebulized steroids and BDs Airway hygiene PRN BiPAP Add Flutter/Teach brace abd w pillow to cough  CARDIOVASCULAR L IJ 10/21 >>> A-line 10/21 >> 10/23 A:  Septic shock - resolved Chronic systolic CHF, compensated CAD s/p 3v CABG History of atrial fibrillation History of complete heart block with pacemaker P:  MAP goal 55 mmHg   RENAL A:  ESRD (HD on MWF) P:   To start regular HD Monitor BMET intermittently Monitor I/Os Correct electrolytes as indicated  GASTROINTESTINAL A:  Perforated viscus Intestinal ischemia Post laparotomy, sigmoid colectomy Liver nodules - concern for metastatic disease P:   Post op mgmt per [facility] NPO, TPN Cont NGT to LIS Await liver biopsy results (colon pathology negative for malignancy) SUP: PPI IV  HEMATOLOGIC A:  Anemia of CKD Thrombocytopenia - holding heparin P: Follow CBC SCDs  INFECTIOUS A:   Severe sepsis Peritonitis History of bacterial endocarditis July 2015 P:   resp culture 10/21 >> Few C. albicans BCx2 10/21 >> ng UC 10/21 >> Neg  Abx: Vancomycin 10/20 >> 10/23 Abx: Zosyn 10/21 >> (plan x 7d)   ENDOCRINE A:   Hypothyroidism  Risk of hyperglycemia on TPN P:   IV levothyroxine @ 1/2 home PO dose Sens scale SSI  NEUROLOGIC A:   Post op pain P:   PRN  fentanyl   Family updated: 10/25   Interdisciplinary Family Meeting v Palliative Care Meeting:    TODAY'S SUMMARY: Extubated, DNR established, await liver bx results, needs colostomy care, HD planned today  ready for transfer to SDU & to [facility] 10/25  Cyril Mourningakesh Alva MD. [facility]. [facility] Pager [phone] If no response call [phone]     03/27/2014 11:39 AM

## 2014-03-27 NOTE — Progress Notes (Signed)
3 Days Post-Op  Subjective: tx to SDU yesterday; pt really doesn't speak to me  Objective: Vital signs in last 24 hours: Temp:  [96.3 F (35.7 C)-98.9 F (37.2 C)] 97.9 F (36.6 C) (10/25 0818) Pulse Rate:  [71-88] 80 (10/25 0818) Resp:  [15-29] 28 (10/25 0818) BP: (123-166)/(46-70) 147/51 mmHg (10/25 0818) SpO2:  [95 %-100 %] 100 % (10/25 0818) Weight:  [101 lb 13.6 oz (46.2 kg)-106 lb 0.7 oz (48.1 kg)] 101 lb 13.6 oz (46.2 kg) (10/24 1636) Last BM Date: 03/27/14  Intake/Output from previous day: 10/24 0701 - 10/25 0700 In: 2361.7 [I.V.:320; IV Piggyback:606.7; TPN:1435] Out: 2500  Intake/Output this shift: Total I/O In: 60 [TPN:60] Out: -   Awake, not oriented; pt intermittently asks questions. Some Follow commands;  Upper airway coarse BS; b/l coarse BS at bases Reg Soft, nd, open lower midline, fascia intact; colostomy - viable, edematous, no air in bag No edema  Lab Results:   Recent Labs  03/25/14 0845  WBC 14.5*  HGB 10.5*  HCT 33.1*  PLT 41*   BMET  Recent Labs  03/26/14 0932 03/27/14 0500  NA 132* 133*  K 3.4* 3.7  CL 94* 95*  CO2 27 27  GLUCOSE 112* 108*  BUN 43* 34*  CREATININE 2.22* 1.87*  CALCIUM 8.3* 8.1*   PT/INR No results found for this basename: LABPROT, INR,  in the last 72 hours ABG No results found for this basename: PHART, PCO2, PO2, HCO3,  in the last 72 hours  Studies/Results: Dg Chest Port 1v Same Day  03/26/2014   CLINICAL DATA:  Pulmonary edema, history of CHF, coronary artery disease post CABG, COPD, hypertension, end-stage renal disease on dialysis, atrial fibrillation  EXAM: PORTABLE CHEST - 1 VIEW SAME DAY  COMPARISON:  Portable exam 1213 hr compared to 03/23/2014  FINDINGS: Nasogastric tube extends into stomach.  LEFT jugular central venous catheter tip projects over SVC.  RIGHT subclavian sequential pacemaker leads project over RIGHT atrium and RIGHT ventricle.  Enlargement of cardiac silhouette post CABG.  Pulmonary  vascular congestion.  Peribronchial thickening and slight chronic accentuation of interstitial markings likely reflect minimal failure.  No segmental infiltrate, pleural effusion or pneumothorax.  Bones demineralized.  IMPRESSION: Enlargement of cardiac silhouette post CABG and pacemaker.  Question minimal pulmonary edema.   Electronically Signed   By: Ulyses SouthwardMark  Boles M.D.   On: 03/26/2014 14:22   Dg Abd Portable 1v  03/26/2014   CLINICAL DATA:  NG tube placement.  EXAM: PORTABLE ABDOMEN - 1 VIEW  COMPARISON:  03/23/2014  FINDINGS: NG tube is seen, entering the left lung with the tip at the left lung base. Recommend complete removal and replacement.  Nonobstructive bowel gas pattern. Heavily calcified aorta. The right growing central line is in place with the tip in the lower abdomen.  IMPRESSION: NG tube in the left lung with the tip in the left lung base.  Critical Value/emergent results were called by telephone at the time of interpretation on 03/26/2014 at 7:11 pm to patient's nurse Georgeann OppenheimAbla Asatsawo who verbally acknowledged these results.   Electronically Signed   By: Charlett NoseKevin  Dover M.D.   On: 03/26/2014 19:12    Anti-infectives: Anti-infectives   Start     Dose/Rate Route Frequency Ordered Stop   03/25/14 2000  piperacillin-tazobactam (ZOSYN) IVPB 2.25 g     2.25 g 100 mL/hr over 30 Minutes Intravenous 3 times per day 03/25/14 1339     03/25/14 1345  piperacillin-tazobactam (ZOSYN) IVPB 2.25 g  Status:  Discontinued     2.25 g 100 mL/hr over 30 Minutes Intravenous 3 times per day 03/25/14 1335 03/25/14 1339   03/18/2014 1400  vancomycin (VANCOCIN) 500 mg in sodium chloride 0.9 % 100 mL IVPB  Status:  Discontinued     500 mg 100 mL/hr over 60 Minutes Intravenous Every 24 hours 03/23/14 1503 03/25/14 0856   03/23/14 2100  piperacillin-tazobactam (ZOSYN) IVPB 2.25 g  Status:  Discontinued     2.25 g 100 mL/hr over 30 Minutes Intravenous 4 times per day 03/23/14 1455 03/25/14 1335   03/23/14 0500   piperacillin-tazobactam (ZOSYN) IVPB 2.25 g  Status:  Discontinued     2.25 g 100 mL/hr over 30 Minutes Intravenous 3 times per day 03/28/2014 2242 03/23/14 1455   04/01/2014 2245  [MAR Hold]  vancomycin (VANCOCIN) IVPB 750 mg/150 ml premix     (On MAR Hold since 03/23/14 0020)   750 mg 150 mL/hr over 60 Minutes Intravenous  Once 03/17/2014 2241 03/23/14 0024      Assessment/Plan: s/p Procedure(s): EXPLORATORY LAPAROTOMY (N/A) COLOSTOMY (N/A)  1. POD 4,2 s/p ex lap with colon resection and subsequent liver biopsy and creation of colostomy with abdominal closure  2. Acute renal failure on CKD , on CRT, continues to improve  3. Septic shock, improving  4. VDRF- resolved 5. Moderate protein calorie malnutrition - TPN,   Aggressive pulm toilet Pt/ot Npo, await bowel function, TPN Cont zosyn for total 7 days given stool contamination Wet-dressing bid to lower midline incision  Mary SellaEric M. Andrey CampanileWilson, MD, FACS General, Bariatric, & Minimally Invasive Surgery [facility] [place]    LOS: 5 days    Atilano InaWILSON,Madeeha Costantino M 03/27/2014

## 2014-03-28 LAB — DIFFERENTIAL
BASOS ABS: 0 10*3/uL (ref 0.0–0.1)
BASOS PCT: 0 % (ref 0–1)
Eosinophils Absolute: 0.1 10*3/uL (ref 0.0–0.7)
Eosinophils Relative: 1 % (ref 0–5)
Lymphocytes Relative: 9 % — ABNORMAL LOW (ref 12–46)
Lymphs Abs: 0.7 10*3/uL (ref 0.7–4.0)
MONOS PCT: 13 % — AB (ref 3–12)
Monocytes Absolute: 1 10*3/uL (ref 0.1–1.0)
NEUTROS ABS: 5.6 10*3/uL (ref 1.7–7.7)
NEUTROS PCT: 77 % (ref 43–77)

## 2014-03-28 LAB — PHOSPHORUS: Phosphorus: 2.3 mg/dL (ref 2.3–4.6)

## 2014-03-28 LAB — COMPREHENSIVE METABOLIC PANEL
ALBUMIN: 2 g/dL — AB (ref 3.5–5.2)
ALT: 20 U/L (ref 0–35)
AST: 30 U/L (ref 0–37)
Alkaline Phosphatase: 125 U/L — ABNORMAL HIGH (ref 39–117)
Anion gap: 15 (ref 5–15)
BUN: 58 mg/dL — ABNORMAL HIGH (ref 6–23)
CO2: 24 mEq/L (ref 19–32)
CREATININE: 2.86 mg/dL — AB (ref 0.50–1.10)
Calcium: 8.2 mg/dL — ABNORMAL LOW (ref 8.4–10.5)
Chloride: 95 mEq/L — ABNORMAL LOW (ref 96–112)
GFR calc Af Amer: 17 mL/min — ABNORMAL LOW (ref >90)
GFR, EST NON AFRICAN AMERICAN: 15 mL/min — AB (ref >90)
Glucose, Bld: 88 mg/dL (ref 70–99)
Potassium: 3.6 mEq/L — ABNORMAL LOW (ref 3.7–5.3)
Sodium: 134 mEq/L — ABNORMAL LOW (ref 137–147)
Total Bilirubin: 0.8 mg/dL (ref 0.3–1.2)
Total Protein: 5.1 g/dL — ABNORMAL LOW (ref 6.0–8.3)

## 2014-03-28 LAB — TRIGLYCERIDES: Triglycerides: 97 mg/dL (ref <150)

## 2014-03-28 LAB — MAGNESIUM: MAGNESIUM: 1.8 mg/dL (ref 1.5–2.5)

## 2014-03-28 LAB — CBC
HCT: 29.3 % — ABNORMAL LOW (ref 36.0–46.0)
HEMOGLOBIN: 9.5 g/dL — AB (ref 12.0–15.0)
MCH: 31.9 pg (ref 26.0–34.0)
MCHC: 32.4 g/dL (ref 30.0–36.0)
MCV: 98.3 fL (ref 78.0–100.0)
Platelets: 54 10*3/uL — ABNORMAL LOW (ref 150–400)
RBC: 2.98 MIL/uL — ABNORMAL LOW (ref 3.87–5.11)
RDW: 16.9 % — ABNORMAL HIGH (ref 11.5–15.5)
WBC: 7.3 10*3/uL (ref 4.0–10.5)

## 2014-03-28 LAB — PREALBUMIN: Prealbumin: 9 mg/dL — ABNORMAL LOW (ref 17.0–34.0)

## 2014-03-28 LAB — GLUCOSE, CAPILLARY: Glucose-Capillary: 96 mg/dL (ref 70–99)

## 2014-03-28 MED ORDER — LEVOTHYROXINE SODIUM 150 MCG PO TABS
150.0000 ug | ORAL_TABLET | Freq: Every day | ORAL | Status: DC
  Administered 2014-03-28 – 2014-04-07 (×11): 150 ug via ORAL
  Filled 2014-03-28 (×12): qty 1

## 2014-03-28 MED ORDER — FENTANYL CITRATE 0.05 MG/ML IJ SOLN
INTRAMUSCULAR | Status: AC
  Filled 2014-03-28: qty 2

## 2014-03-28 MED ORDER — CETYLPYRIDINIUM CHLORIDE 0.05 % MT LIQD
7.0000 mL | Freq: Two times a day (BID) | OROMUCOSAL | Status: DC
  Administered 2014-03-28 – 2014-04-07 (×19): 7 mL via OROMUCOSAL

## 2014-03-28 MED ORDER — LORAZEPAM 2 MG/ML IJ SOLN
0.5000 mg | Freq: Once | INTRAMUSCULAR | Status: AC
  Administered 2014-03-28: 0.5 mg via INTRAVENOUS

## 2014-03-28 MED ORDER — LORAZEPAM 2 MG/ML IJ SOLN
INTRAMUSCULAR | Status: AC
  Filled 2014-03-28: qty 1

## 2014-03-28 MED ORDER — PANTOPRAZOLE SODIUM 40 MG PO TBEC
40.0000 mg | DELAYED_RELEASE_TABLET | Freq: Every day | ORAL | Status: DC
  Administered 2014-03-28 – 2014-03-31 (×4): 40 mg via ORAL
  Filled 2014-03-28 (×5): qty 1

## 2014-03-28 MED ORDER — RESOURCE THICKENUP CLEAR PO POWD
ORAL | Status: DC | PRN
  Administered 2014-03-31: 21:00:00 via ORAL
  Filled 2014-03-28 (×2): qty 125

## 2014-03-28 MED ORDER — ALPRAZOLAM 0.5 MG PO TABS
0.5000 mg | ORAL_TABLET | Freq: Three times a day (TID) | ORAL | Status: DC | PRN
  Administered 2014-03-28 – 2014-04-06 (×10): 0.5 mg via ORAL
  Filled 2014-03-28 (×10): qty 1

## 2014-03-28 MED ORDER — TRACE MINERALS CR-CU-F-FE-I-MN-MO-SE-ZN IV SOLN
INTRAVENOUS | Status: DC
  Filled 2014-03-28: qty 2000

## 2014-03-28 MED ORDER — TRACE MINERALS CR-CU-F-FE-I-MN-MO-SE-ZN IV SOLN
INTRAVENOUS | Status: AC
  Administered 2014-03-28: 18:00:00 via INTRAVENOUS
  Filled 2014-03-28: qty 1000

## 2014-03-28 NOTE — Progress Notes (Signed)
Spoke with pt's daughter, Stanton KidneyDebra about who would be caring for the pt's ostomy at home or after discharge. She states that pt's daughter Lennox Laityheresa Fore lives next door and would be the most appropriate family member to have present when colostomy care/instruction is performed.

## 2014-03-28 NOTE — Progress Notes (Signed)
[facility]rukowski Progress Note 4 Days Post-Op  Subjective: Patient sitting up in chair, working with ST trying to eat.  Not very much PO intake.  Constantly complaining of abdominal pain.  Objective: Vital signs in last 24 hours: Temp:  [97.5 F (36.4 C)-98.4 F (36.9 C)] 97.5 F (36.4 C) (10/26 0759) Pulse Rate:  [75-77] 75 (10/26 0508) Resp:  [17-27] 24 (10/26 0508) BP: (109-146)/(50-74) 109/50 mmHg (10/26 0439) SpO2:  [84 %-100 %] 98 % (10/26 0508) Weight:  [48.9 kg (107 lb 12.9 oz)] 48.9 kg (107 lb 12.9 oz) (10/26 0439) Last BM Date: 03/21/14  Intake/Output from previous day: 10/25 0701 - 10/26 0700 In: 623.3 [I.V.:108.3; IV Piggyback:100; TPN:415] Out: 1 [Stool:1] Intake/Output this shift:    General: Moderated acute distress, says that she is no better since surgery.  Lungs: Clear  Abd: Soft, wounds are fine, LUQ colostomy is viable and functioning.  Good bowel sounds.  Extremities: No changes  Neuro: Intact  Lab Results:  @LABLAST2 (wbc:2,hgb:2,hct:2,plt:2) BMET  Recent Labs  03/27/14 0500 03/28/14 0515  NA 133* 134*  K 3.7 3.6*  CL 95* 95*  CO2 27 24  GLUCOSE 108* 88  BUN 34* 58*  CREATININE 1.87* 2.86*  CALCIUM 8.1* 8.2*   PT/INR No results found for this basename: LABPROT, INR,  in the last 72 hours ABG No results found for this basename: PHART, PCO2, PO2, HCO3,  in the last 72 hours  Studies/Results: Dg Chest Port 1v Same Day  03/26/2014   CLINICAL DATA:  Pulmonary edema, history of CHF, coronary artery disease post CABG, COPD, hypertension, end-stage renal disease on dialysis, atrial fibrillation  EXAM: PORTABLE CHEST - 1 VIEW SAME DAY  COMPARISON:  Portable exam 1213 hr compared to 03/23/2014  FINDINGS: Nasogastric tube extends into stomach.  LEFT jugular central venous catheter tip projects over SVC.  RIGHT subclavian sequential pacemaker leads project over RIGHT atrium and RIGHT ventricle.  Enlargement of cardiac silhouette post CABG.  Pulmonary  vascular congestion.  Peribronchial thickening and slight chronic accentuation of interstitial markings likely reflect minimal failure.  No segmental infiltrate, pleural effusion or pneumothorax.  Bones demineralized.  IMPRESSION: Enlargement of cardiac silhouette post CABG and pacemaker.  Question minimal pulmonary edema.   Electronically Signed   By: Ulyses SouthwardMark  Boles M.D.   On: 03/26/2014 14:22   Dg Abd Portable 1v  03/26/2014   CLINICAL DATA:  NG tube placement.  EXAM: PORTABLE ABDOMEN - 1 VIEW  COMPARISON:  03/23/2014  FINDINGS: NG tube is seen, entering the left lung with the tip at the left lung base. Recommend complete removal and replacement.  Nonobstructive bowel gas pattern. Heavily calcified aorta. The right growing central line is in place with the tip in the lower abdomen.  IMPRESSION: NG tube in the left lung with the tip in the left lung base.  Critical Value/emergent results were called by telephone at the time of interpretation on 03/26/2014 at 7:11 pm to patient's nurse Georgeann OppenheimAbla Asatsawo who verbally acknowledged these results.   Electronically Signed   By: Charlett NoseKevin  Dover M.D.   On: 03/26/2014 19:12    Anti-infectives: Anti-infectives   Start     Dose/Rate Route Frequency Ordered Stop   03/25/14 2000  piperacillin-tazobactam (ZOSYN) IVPB 2.25 g     2.25 g 100 mL/hr over 30 Minutes Intravenous 3 times per day 03/25/14 1339     03/25/14 1345  piperacillin-tazobactam (ZOSYN) IVPB 2.25 g  Status:  Discontinued     2.25 g 100 mL/hr over 30  Minutes Intravenous 3 times per day 03/25/14 1335 03/25/14 1339   03/04/2014 1400  vancomycin (VANCOCIN) 500 mg in sodium chloride 0.9 % 100 mL IVPB  Status:  Discontinued     500 mg 100 mL/hr over 60 Minutes Intravenous Every 24 hours 03/23/14 1503 03/25/14 0856   03/23/14 2100  piperacillin-tazobactam (ZOSYN) IVPB 2.25 g  Status:  Discontinued     2.25 g 100 mL/hr over 30 Minutes Intravenous 4 times per day 03/23/14 1455 03/25/14 1335   03/23/14 0500   piperacillin-tazobactam (ZOSYN) IVPB 2.25 g  Status:  Discontinued     2.25 g 100 mL/hr over 30 Minutes Intravenous 3 times per day 2013-12-08 2242 03/23/14 1455   2013-12-08 2245  [MAR Hold]  vancomycin (VANCOCIN) IVPB 750 mg/150 ml premix     (On MAR Hold since 03/23/14 0020)   750 mg 150 mL/hr over 60 Minutes Intravenous  Once 2013-12-08 2241 03/23/14 0024      Assessment/Plan: s/p Procedure(s): EXPLORATORY LAPAROTOMY COLOSTOMY Advance diet Surgicallly the patient is doing fine.  Need to improve diet and nutrition overall.  LOS: 6 days   Marta LamasJames O. Gae BonWyatt, III, MD, FACS [phone]--pager [phone]--office [facility] 03/28/2014

## 2014-03-28 NOTE — Progress Notes (Signed)
OT Cancellation Note  Patient Details Name: [name] MRN: [id number] DOB: [birth year]   Cancelled Treatment:    Reason Eval/Treat Not Completed: Pain limiting ability to participate;Fatigue/lethargy limiting ability to participate. Will continue to follow.  Evern BioMayberry, Veda Arrellano Lynn 03/28/2014, 11:55 AM

## 2014-03-28 NOTE — Consult Note (Signed)
WOC ostomy consult note Pt s/p exploratory laparotomy and colostomy 03/25/2014 Stoma type/location: LLQ, end colostomy Stomal assessment/size: large, pink, moist stoma.  Pouch has just been changed and is clean, dry and intact.   Peristomal assessment: pouch intact, will not plan to change at this time Treatment options for stomal/peristomal skin: NA Output brown liquid stool in pouch  Ostomy pouching: 2pc.  Education provided: pt up in chair, wheezing and some cough when she tries to talk to me. She reports her pain as a 10 out of 10 and wants to get back in the bed.  She has only been up in the chair for a short time.  I assessed the abdomen and stoma and explained my role as an ostomy nurse.  She reports she will return to home where is lives alone, she has a daughter that will assist with her care. I will attempt to contact her daughter to determine the level of support she will be able to provide. I explained the creation of a stoma today to the patient and will begin education when she is in less pain and more receptive to education.   Educational materials left in the room. Supplies ordered to the bedside for use.   WOC team will follow along with you for ostomy care and training.   Anna-Marie Coller ParadiseAustin RN,CWOCN [phone]

## 2014-03-28 NOTE — Progress Notes (Signed)
PT Cancellation Note  Patient Details Name: Jacalyn LefevreVera M Pellecchia MRN: [id number] DOB: 04/30/1933   Cancelled Treatment:    Reason Eval/Treat Not Completed: Pain limiting ability to participate.  Pt painful and c/o very fatigued.  Will try back another time.     Donne Robillard, Alison MurrayMegan F 03/28/2014, 8:14 AM

## 2014-03-28 NOTE — Progress Notes (Signed)
[facility] Progress Note   Subjective: More awake, oriented to self, location.  On TPN.    Filed Vitals:   03/28/14 0508 03/28/14 0759 03/28/14 0800 03/28/14 0930  BP:   112/51   Pulse: 75  75   Temp:  97.5 F (36.4 C)    TempSrc:  Oral    Resp: 24  26   Height:      Weight:      SpO2: 98%  97% 96%   Exam: Alert, no distress, nasal O2, No jvd  Clear on ant auscultation RRR 3/6 SEM no rub, distant heart sounds  Abd dressing in place,  LUQ ostomy No LE or UE edema  Neuro is alert, nonfocal,    HD: MWF [place] 3.5h   44.5kg   2/2.25 [place]  350/800  Heparin none  Profile 2 No meds  Assessment:  1 Ruptured sigmoid colon / ischemia bowel - s/p partial colectomy, colostomy found to have liver nodules as well, biopsied (both colonic and liver path are benign); Vanc/Zosyn 2 ESRD - s/p CRRT x 3 days; back on MWF schedule 3 Anemia: Off ESA. Follow Hb, probablywill need ESA; Add on Iron studies today 4 Nutrition - on TNA at 30 cc/hr 5 HTPH resume sensipar when eating, no binders P in range 6 Cardiac - hx Afib / PPM / hx CABG / CM EF 15-20%  7 DNR/DNI 9 COPD   Plan- HD today.  Some improvement.    Sabra Heckyan Sheniya Garciaperez MD [phone]     Recent Labs Lab 03/26/14 0932 03/27/14 0500 03/28/14 0515  NA 132* 133* 134*  K 3.4* 3.7 3.6*  CL 94* 95* 95*  CO2 27 27 24   GLUCOSE 112* 108* 88  BUN 43* 34* 58*  CREATININE 2.22* 1.87* 2.86*  CALCIUM 8.3* 8.1* 8.2*  PHOS 2.0* 2.3 2.3    Recent Labs Lab 03/23/14 0448  03/17/2014 0358 03/08/2014 1600 03/25/14 0442 03/28/14 0515  AST 19  --  31  --   --  30  ALT 15  --  17  --   --  20  ALKPHOS 96  --  97  --   --  125*  BILITOT 0.5  --  0.4  --   --  0.8  PROT 4.8*  --  5.1*  --   --  5.1*  ALBUMIN 2.6*  < > 2.2* 2.2* 2.2* 2.0*  < > = values in this interval not displayed.  Recent Labs Lab 03/27/2014 0358 03/25/14 0845 03/28/14 0515  WBC 20.3* 14.5* 7.3  NEUTROABS 19.3*  --  5.6  HGB 13.7 10.5* 9.5*  HCT 42.8 33.1*  29.3*  MCV 102.6* 103.4* 98.3  PLT 70* 41* 54*   . antiseptic oral rinse  7 mL Mouth Rinse QID  . arformoterol  15 mcg Nebulization BID  . budesonide (PULMICORT) nebulizer solution  0.5 mg Nebulization BID  . chlorhexidine  15 mL Mouth Rinse BID  . levothyroxine  150 mcg Oral QAC breakfast  . olopatadine  1 drop Both Eyes BID  . ondansetron (ZOFRAN) IV  4 mg Intravenous 3 times per day  . pantoprazole  40 mg Oral Daily  . piperacillin-tazobactam (ZOSYN)  IV  2.25 g Intravenous 3 times per day  . tiotropium  18 mcg Inhalation Daily   . sodium chloride 10 mL/hr at 03/27/14 1345  . TPN (CLINIMIX) Adult without lytes 30 mL/hr at 03/27/14 1804  . TPN (CLINIMIX) Adult without lytes  albuterol, fentaNYL, pramoxine-mineral oil-zinc, RESOURCE THICKENUP CLEAR, sodium chloride

## 2014-03-28 NOTE — Evaluation (Signed)
Physical Therapy Evaluation Patient Details Name: Maria LefevreVera M [name] MRN: [id number] DOB: 1932/08/07 Today's Date: 03/28/2014   History of Present Illness  pt presents with VDRF and Sepsis.    Clinical Impression  Pt indicates pain "all over" with any mobility.  Attempted to have pt sit to EOB, however unable due to pain.  Will continue to follow.      Follow Up Recommendations SNF    Equipment Recommendations  None recommended by PT    Recommendations for Other Services       Precautions / Restrictions Precautions Precautions: Fall Restrictions Weight Bearing Restrictions: No      Mobility  Bed Mobility Overal bed mobility: Needs Assistance Bed Mobility: Supine to Sit;Sit to Supine     Supine to sit: Mod assist;HOB elevated Sit to supine: Mod assist;HOB elevated   General bed mobility comments: Unable to achieve full sitting to EOB 2/2 pain "all over" per pt report.  pt indicating unable to continue further.    Transfers                    Ambulation/Gait                Stairs            Wheelchair Mobility    Modified Rankin (Stroke Patients Only)       Balance                                             Pertinent Vitals/Pain Pain Assessment: 0-10 Pain Score: 10-Worst pain ever Pain Location: "All over." Pain Descriptors / Indicators: Grimacing;Moaning Pain Intervention(s): Repositioned;Premedicated before session    Home Living Family/patient expects to be discharged to:: Unsure Living Arrangements: Alone Available Help at Discharge: Personal care attendant;Family;Available 24 hours/day (PCA 6hr x 6 days per wk.) Type of Home: House Home Access: Ramped entrance     Home Layout: One level Home Equipment: Shower seat;Grab bars - tub/shower;Hand held shower head;Walker - 2 wheels;Bedside commode Additional Comments: Home set-up taken from previous admit as pt having difficulty answering questions.      Prior  Function Level of Independence: Needs assistance   Gait / Transfers Assistance Needed: uses a RW with supervision  ADL's / Homemaking Assistance Needed: Caregiver helps with housekeeping, meals, and personal care.  Comments: PLOF taken from previous admit as pt having difficulty answering questions.       Hand Dominance   Dominant Hand: Right    Extremity/Trunk Assessment   Upper Extremity Assessment: Defer to OT evaluation           Lower Extremity Assessment: Generalized weakness         Communication   Communication: No difficulties  Cognition Arousal/Alertness: Awake/alert Behavior During Therapy: Flat affect Overall Cognitive Status: No family/caregiver present to determine baseline cognitive functioning                      General Comments      Exercises        Assessment/Plan    PT Assessment Patient needs continued PT services  PT Diagnosis Difficulty walking;Generalized weakness;Acute pain   PT Problem List Decreased strength;Decreased activity tolerance;Decreased balance;Decreased mobility;Decreased cognition;Decreased knowledge of use of DME;Pain  PT Treatment Interventions DME instruction;Gait training;Functional mobility training;Therapeutic activities;Therapeutic exercise;Balance training;Cognitive remediation;Patient/family education   PT Goals (Current goals can be  found in the Care Plan section) Acute Rehab PT Goals Patient Stated Goal: get home PT Goal Formulation: With patient Time For Goal Achievement: 04/11/14 Potential to Achieve Goals: Good    Frequency Min 3X/week   Barriers to discharge        Co-evaluation               End of Session   Activity Tolerance: Patient limited by pain Patient left: in bed;with call bell/phone within reach Nurse Communication: Mobility status (pt pain)         Time: 1016-1030 PT Time Calculation (min): 14 min   Charges:   PT Evaluation $Initial PT Evaluation Tier I: 1  Procedure     PT G CodesSunny [name]:          [name], [place] CarolinaPT [phone] 03/28/2014, 11:48 AM

## 2014-03-28 NOTE — Progress Notes (Signed)
Pt's colostomy bag noticed to be soiled at this time. Upon further review in pt's chart there doesn't seem to be an order for a WOC consult nor had one ever been ordered/performed since pt's colostomy had been formed on 10/20. This RN placed order at this time by protocol for WOC consult. Also supplies obtained and colostomy bag 2 piece changed and skin surrounding stoma cleaned with wet washcloth. Large clot clean from between stoma and pt's skin. Stoma very edematous but pink/red in color. Item number for bag/device [place] [id number]. Wafer cut to 51mm in diameter at this time. Will continue to monitor pt.

## 2014-03-28 NOTE — Evaluation (Signed)
Occupational Therapy Evaluation Patient Details Name: [name] MRN: [id number] DOB: Jan 02, 1933 Today's Date: 03/28/2014    History of Present Illness Pt admitted with pneumoperitoneum.  S/P removal of necrotic bowel 10/20 and fascial closure 10/22.  Pt with liver biopsy results pending.  PMH:  COPD, severe sepsis and septic shock with perontenitis, CHF, afib, ESRD.   Clinical Impression   Per history from previous admission, pt was assisted for ADL and IADL, but unclear of degree of help pt received.  Pt presents with pain and was initially confused calling out for "Betsy" when awakening, but able to state she was in the hospital when questioned.  Session limited by pain. Will follow acutely.    Follow Up Recommendations  SNF;Supervision/Assistance - 24 hour    Equipment Recommendations       Recommendations for Other Services       Precautions / Restrictions Precautions Precautions: Fall Restrictions Weight Bearing Restrictions: No      Mobility Bed Mobility Overal bed mobility: Needs Assistance Bed Mobility: Supine to Sit;Sit to Supine     Supine to sit: Mod assist;HOB elevated Sit to supine: Mod assist;HOB elevated   General bed mobility comments: Sat EOB momentarily, but immediately requesting to lay back down.  Transfers                      Balance                                            ADL Overall ADL's : Needs assistance/impaired                                       General ADL Comments: Pt awoke and calling out for "Betsy."  OT entered room and pt able to state where she was.  Requesting to sit up.  Assisted pt to EOB, but then she immediately wanted to return to supine.  Raised pts head in bed and she stated she felt better.  Pt kicking and pushing blanket off of her legs stating she was hot.  Placed fan and gave pt cool washcloth for her face.       Vision                     Perception     Praxis      Pertinent Vitals/Pain Pain Assessment: Faces Pain Score: 10-Worst pain ever Faces Pain Scale: Hurts worst Pain Location: pt unable to locate Pain Descriptors / Indicators: Grimacing;Moaning Pain Intervention(s): Limited activity within patient's tolerance;Monitored during session;Repositioned     Hand Dominance Right   Extremity/Trunk Assessment Upper Extremity Assessment Upper Extremity Assessment: Generalized weakness   Lower Extremity Assessment Lower Extremity Assessment: Defer to PT evaluation       Communication Communication Communication: No difficulties   Cognition Arousal/Alertness: Lethargic Behavior During Therapy: Flat affect Overall Cognitive Status: No family/caregiver present to determine baseline cognitive functioning (Pt calling out for "Betsy")                     General Comments       Exercises       Shoulder Instructions      Home Living Family/patient expects to be discharged to:: Unsure Living Arrangements: Alone Available Help  at Discharge: Personal care attendant;Family;Available 24 hours/day Type of Home: House Home Access: Ramped entrance     Home Layout: One level               Home Equipment: Shower seat;Grab bars - tub/shower;Hand held shower head;Walker - 2 wheels;Bedside commode   Additional Comments: Home set-up taken from previous admit as pt having difficulty answering questions.        Prior Functioning/Environment Level of Independence: Needs assistance  Gait / Transfers Assistance Needed: uses a RW with supervision ADL's / Homemaking Assistance Needed: Caregiver helps with housekeeping, meals, and personal care.   Comments: PLOF taken from previous admit as pt having difficulty answering questions.      OT Diagnosis: Generalized weakness;Acute pain;Cognitive deficits   OT Problem List: Decreased strength;Decreased activity tolerance;Impaired balance (sitting and/or standing);Decreased  cognition;Decreased safety awareness;Pain   OT Treatment/Interventions: Self-care/ADL training;DME and/or AE instruction;Therapeutic activities;Cognitive remediation/compensation;Patient/family education;Balance training    OT Goals(Current goals can be found in the care plan section) Acute Rehab OT Goals Patient Stated Goal: get home OT Goal Formulation: Patient unable to participate in goal setting Time For Goal Achievement: 04/11/14 Potential to Achieve Goals: Good  OT Frequency: Min 2X/week   Barriers to D/C:            Co-evaluation              End of Session    Activity Tolerance: Patient limited by pain Patient left: in bed;with call bell/phone within reach;with bed alarm set   Time: [id number] OT Time Calculation (min): 18 min Charges:  OT General Charges $OT Visit: 1 Procedure OT Evaluation $Initial OT Evaluation Tier I: 1 Procedure OT Treatments $Self Care/Home Management : 8-22 mins G-Codes:    Evern BioMayberry, Ronith Berti Lynn 03/28/2014, 1:48 PM [phone]

## 2014-03-28 NOTE — Progress Notes (Addendum)
[place] NOTE   Pharmacy Consult for TPN Indication: pneumoperitoneum with bowel resection  Allergies  Allergen Reactions  . Ivp Dye [Iodinated Diagnostic Agents] Swelling  . Lisinopril Swelling  . Iohexol Itching and Swelling        Patient Measurements: Height: _0  (160 cm) Weight: 107 lb 12.9 oz (48.9 kg) IBW/kg (Calculated) : 52.4  Vital Signs: Temp: 97.5 F (36.4 C) (10/26 0759) Temp Source: Oral (10/26 0759) BP: 112/51 mmHg (10/26 0800) Pulse Rate: 75 (10/26 0800) Intake/Output from previous day: 10/25 0701 - 10/26 0700 In: 623.3 [I.V.:108.3; IV Piggyback:100; TPN:415] Out: 1 [Stool:1] Intake/Output from this shift:    Labs:  Recent Labs  03/28/14 0515  WBC 7.3  HGB 9.5*  HCT 29.3*  PLT 54*     Recent Labs  03/26/14 0932 03/27/14 0500 03/28/14 0515  NA 132* 133* 134*  K 3.4* 3.7 3.6*  CL 94* 95* 95*  CO2 _1 GLUCOSE 112* 108* 88  BUN 43* 34* 58*  CREATININE 2.22* 1.87* 2.86*  CALCIUM 8.3* 8.1* 8.2*  MG 2.1 1.7 1.8  PHOS 2.0* 2.3 2.3  PROT  --   --  5.1*  ALBUMIN  --   --  2.0*  AST  --   --  30  ALT  --   --  20  ALKPHOS  --   --  125*  BILITOT  --   --  0.8  TRIG  --   --  97   Estimated Creatinine Clearance: 12.3 ml/min (by C-G formula based on Cr of 2.86).    Recent Labs  03/27/14 1435 03/27/14 2258 03/28/14 0616  GLUCAP 131* 100* 96   Insulin Requirements in the past 24 hours:  none  Current Nutrition:  Clinimix 5/15 at 38m/hr (per renal request) and 20% lipid emulsion at 162mhr only on Wed. This will provide average of 36 g protein and 579 kcal.  Nutritional Goals:  1024kCal, 65-80 grams of protein per day per RD recommendations 10/21  (Re-estimated for patient on CRRT- 2000-2200kcal, 75-85g protein, however CRRT stopped 10/23 PM)  Admit: [id number] admitted 10/20 after transfer from [place] with perforated bowel- CT showed free air and thickened small bowel. She was taken to OR 10/20 PM for ex  lap, application of open wound VAC and sigmoid colon resection.   GI: hx duodenal ulcers; POD#4 ex lap, sigmoid colon resection. Went back to OR 10/22 for re-exp, colostomy - also found to have several liver nodules suspicious for malignancy which were biopsied. Baseline prealbumin 14.1 (slightly low). NGT now out as patient displaced during agitation while on HD Saturday.  pt c/o abd pain; SLP eval this am: rec Dys 1, pudding thick liq, pills whole in applesauce.  Diet to adv from NPO to dys 1.  Change IV PPI to PO.   Endo: hx hypothyroidism, TSH 1.6 on admit, continued on home Synthroid . No noted history of DM, no A1C on file. CBGs low-nl since starting TPN; change IV synthroid to PO  Lytes: NO LYTES IN TPN; Na/Cl low, K nml at 3.6 Mag 1.8 Phos 2.3, CorCa 9.8   Renal: ESRD on HD MWF with extra HD Sat 10/24 due to TPN.  Renal started CRRT 10/21 PM, then stopped 10/23. Now undergoing iHD- last session 10/24- tolerated 3 hr at BFR 400- noted she became agitated during the session. TPN at 30 ml/hr per renal request.   Cards: CAD s/p CABG in 2008, HF with EF 25-30%,  afib.    Hepatobil: LFTs remain WNL x alk phos slightly elevated at 125. Marland Kitchen Albumin low at 2.0. Baseline TG 199, now trig 97  ID: Continues on Zosyn for bowel perf. Blood and urine cultures with no growth to date, trach aspirate with few candida. WBC down to 7.3, currently afebrile.  Best Practices: subq hep, PPI po  TPN Access: CVC double lumen left IJ placed 10/20 TPN day#: 5 (started 10/21)  Plan -continue Clinimix 5/15 (NO ELECTROLYTES)  at 56m/hr per renal MD request. Will only give IVFE once per week (on Wed). This will 36g protein daily and an average of 579 kcal/day- cannot concentrate TPN any further to provide more nutrients without providing excess fluid. Diet just advanced to Dys 1 today -. Multivitamin and trace elements in TPN, ? Change to PO tomorrow -Dc SSI/CBGs - Maintenance fluids per CCM and Renal --change IV  PPI and IV synthroid to PO  MEudelia Bunch Pharm.D. [phone]/26/2015 10:07 AM

## 2014-03-28 NOTE — Progress Notes (Signed)
[place] TEAM 1 - Stepdown/ICU TEAM Progress Note  [name] ZOX:[id number] DOB: 01-20-1933 DOA: 03/11/2014 PCP: Galvin Proffer, MD  Admit HPI / Brief Narrative: 80 y.o. female with history of CAD s/p CABG, systolic CHF, COPD, tobacco use, and ESRD who presented with AMS and dyspnea and was found to have a pneumoperitoneum. Difficult extubation was expected postoperatively and PCCM was consulted.   Significant Events:  10/20 Ex lap and removal of necrotic bowel; on neo  10/21 On levophed and neo  10/22 Back to OR, fascial closure. Finding of multiple liver nodules not previously noted. Liver biopsy obtained  10/22 Family conference. Pt made DNR in event of cardiac arrest.  10/23 Off vasopressors, extubated with adequate resp status post extubaiton  10/24 Stable for tx to SDU  10/25 [facility] assumed care   HPI/Subjective: Pt is c/o abdom pain.  Denies nausea, vomiting, sob, or cp.  Is resisting efforts to keep her sitting up in a chair.    Assessment/Plan:  Acute on chronic respiratory failure, post operative VDRF  - in pt with severe COPD and ongoing tobacco abuse  - OETT 10/21>10/23  - pt now weaned to RA - Cont nebulized steroids and BD's, provide BiPAP as needed   Severe sepsis and septic shock, secondary to peritonitis  - resp culture 10/21 > Few C. albicans  - BCx2 10/21 >> ng  - UC 10/21 >> Neg  - Abx: Vancomycin 10/20 > 10/22  - Abx: Zosyn 10/20 > stop 10/27   Perforated viscus with Intestinal ischemia  - Post laparotomy, sigmoid colectomy and liver biopsy, creation of colostomy with abdominal closure  - Aggressive pulm toilet, PT/OT  - Cont zosyn for total 7 days given stool contamination  - Wet-dressing bid to lower midline incision  - Gen Surgery following and now advancing diet   Chronic systolic CHF, compensated  - HD for volume control   History of atrial fibrillation  - rate controlled - in NSR presently   History of complete heart block with pacemaker     CAD s/p 3v CABG   ESRD (HD on MWF)  - per Nephrology   Liver nodules  - biopsy results indicate benign process - colon pathology negative for malignancy   Anemia of CKD  - no signs of active bleeding  - follow CBC   Thrombocytopenia  - continue holding Heparin products - Plt slowly improving  - SCD's for DVT prophylaxis   Hypothyroidism  - IV levothyroxine @ 1/2 home PO dose   Severe PCM  - secondary to acute illness with complications outlined above   Acute functional quadriplegia  - secondary to acute illness as noted above  - PT/OT evaluation   Code Status: DNR Family Communication: spoke w/ daughter at bedside  Disposition Plan: SDU  Consultants: Gen Surgery  Nephrology  PCCM   Antibiotics: Noted above   DVT prophylaxis: SCDs  Objective: Blood pressure 127/71, pulse 118, temperature 98.2 F (36.8 C), temperature source Oral, resp. rate 31, height 5\' 3"  (1.6 m), weight 48.9 kg (107 lb 12.9 oz), SpO2 97.00%.  Intake/Output Summary (Last 24 hours) at 03/28/14 1640 Last data filed at 03/28/14 0650  Gross per 24 hour  Intake 563.33 ml  Output      1 ml  Net 562.33 ml   Exam: General: No acute respiratory distress Lungs: poor air movement th/o all fields - no active wheeze  Cardiovascular: Regular rate and rhythm without murmur gallop or rub  Abdomen: soft,  bowel sounds hypoactive, no appreciable mass Extremities: No significant cyanosis, clubbing, or edema bilateral lower extremities  Data Reviewed: Basic Metabolic Panel:  Recent Labs Lab 04/02/2014 0358 03/03/2014 1600 03/25/14 0442 03/26/14 0932 03/27/14 0500 03/28/14 0515  NA 139 135* 136* 132* 133* 134*  K 5.1 4.7 4.5 3.4* 3.7 3.6*  CL 101 101 101 94* 95* 95*  CO2 22 21 23 27 27 24   GLUCOSE 144* 105* 106* 112* 108* 88  BUN 43* 31* 22 43* 34* 58*  CREATININE 3.25* 2.20* 1.48* 2.22* 1.87* 2.86*  CALCIUM 7.3* 7.2* 7.9* 8.3* 8.1* 8.2*  MG 1.8  --  2.0 2.1 1.7 1.8  PHOS 4.6 3.4 2.3 2.0*  2.3 2.3    Liver Function Tests:  Recent Labs Lab 03/23/14 0448 03/23/14 2320 03/15/2014 0358 03/16/2014 1600 03/25/14 0442 03/28/14 0515  AST 19  --  31  --   --  30  ALT 15  --  17  --   --  20  ALKPHOS 96  --  97  --   --  125*  BILITOT 0.5  --  0.4  --   --  0.8  PROT 4.8*  --  5.1*  --   --  5.1*  ALBUMIN 2.6* 2.4* 2.2* 2.2* 2.2* 2.0*   Coags:  Recent Labs Lab Apr 08, 2014 2257 03/23/14 0448  INR 1.20 1.34    Recent Labs Lab 08-Apr-2014 2257 03/15/2014 0358 03/25/14 0442  APTT 25 33 35    CBC:  Recent Labs Lab 03/23/14 0106 03/23/14 0448 03/22/2014 0358 03/25/14 0845 03/28/14 0515  WBC  --  2.6* 20.3* 14.5* 7.3  NEUTROABS  --   --  19.3*  --  5.6  HGB 13.9 13.4 13.7 10.5* 9.5*  HCT 41.0 43.2 42.8 33.1* 29.3*  MCV  --  105.1* 102.6* 103.4* 98.3  PLT  --  110* 70* 41* 54*    Cardiac Enzymes:  Recent Labs Lab 2014-04-08 2257 03/23/14 0448 03/23/14 1045  TROPONINI <0.30 <0.30 <0.30   CBG:  Recent Labs Lab 03/26/14 2305 03/27/14 0658 03/27/14 1435 03/27/14 2258 03/28/14 0616  GLUCAP 127* 103* 131* 100* 96    Recent Results (from the past 240 hour(s))  MRSA PCR SCREENING     Status: None   Collection Time    03/23/14  2:34 AM      Result Value Ref Range Status   MRSA by PCR NEGATIVE  NEGATIVE Final   Comment:            The GeneXpert MRSA Assay (FDA     approved for NASAL specimens     only), is one component of a     comprehensive MRSA colonization     surveillance program. It is not     intended to diagnose MRSA     infection nor to guide or     monitor treatment for     MRSA infections.  CULTURE, BLOOD (ROUTINE X 2)     Status: None   Collection Time    03/23/14  3:23 AM      Result Value Ref Range Status   Specimen Description BLOOD LEFT HAND   Final   Special Requests BOTTLES DRAWN AEROBIC ONLY 1CC   Final   Culture  Setup Time     Final   Value: 03/23/2014 09:07     Performed at [facility]   Culture     Final   Value:  BLOOD CULTURE RECEIVED NO GROWTH TO DATE CULTURE WILL BE HELD FOR 5 DAYS BEFORE ISSUING A FINAL NEGATIVE REPORT     [facility]   Report Status PENDING   Incomplete  CULTURE, BLOOD (ROUTINE X 2)     Status: None   Collection Time    03/23/14  3:35 AM      Result Value Ref Range Status   Specimen Description BLOOD RIGHT HAND   Final   Special Requests BOTTLES DRAWN AEROBIC ONLY 1CC   Final   Culture  Setup Time     Final   Value: 03/23/2014 09:07     Performed at [facility]   Culture     Final   Value:        BLOOD CULTURE RECEIVED NO GROWTH TO DATE CULTURE WILL BE HELD FOR 5 DAYS BEFORE ISSUING A FINAL NEGATIVE REPORT     Performed at [facility]   Report Status PENDING   Incomplete  URINE CULTURE     Status: None   Collection Time    03/23/14  3:49 AM      Result Value Ref Range Status   Specimen Description URINE, CATHETERIZED   Final   Special Requests ADDED [id number]   Final   Culture  Setup Time     Final   Value: 03/23/2014 08:44     Performed at [facility]   Colony Count     Final   Value: NO GROWTH     Performed at [facility]   Culture     Final   Value: NO GROWTH     Performed at [facility]   Report Status 04/02/2014 FINAL   Final  CULTURE, RESPIRATORY (NON-EXPECTORATED)     Status: None   Collection Time    03/23/14 10:15 AM      Result Value Ref Range Status   Specimen Description TRACHEAL ASPIRATE   Final   Special Requests NONE   Final   Gram Stain     Final   Value: RARE WBC PRESENT,BOTH PMN AND MONONUCLEAR     NO SQUAMOUS EPITHELIAL CELLS SEEN     RARE YEAST     Performed at [facility]   Culture     Final   Value: FEW CANDIDA ALBICANS     Performed at [facility]   Report Status 03/25/2014 FINAL   Final     Studies:  Recent x-ray studies have been reviewed in detail by the Attending Physician  Scheduled Meds:  Scheduled Meds: . antiseptic oral rinse   7 mL Mouth Rinse QID  . arformoterol  15 mcg Nebulization BID  . budesonide (PULMICORT) nebulizer solution  0.5 mg Nebulization BID  . chlorhexidine  15 mL Mouth Rinse BID  . levothyroxine  150 mcg Oral QAC breakfast  . olopatadine  1 drop Both Eyes BID  . ondansetron (ZOFRAN) IV  4 mg Intravenous 3 times per day  . pantoprazole  40 mg Oral Daily  . piperacillin-tazobactam (ZOSYN)  IV  2.25 g Intravenous 3 times per day  . tiotropium  18 mcg Inhalation Daily    Time spent on care of this patient: 35 mins   Jakaiden Fill T , MD   [facility] Office  [phone] Pager - Text Page per Loretha StaplerAmion as per below:  On-Call/Text Page:      [email or web address]      password [email or web address]  If 7PM-7AM, please contact  night-coverage [email or web address] Password [email or web address] 03/28/2014, 4:40 PM   LOS: 6 days

## 2014-03-28 NOTE — Progress Notes (Signed)
Speech Language Pathology Treatment: Dysphagia  Patient Details Name: [name] MRN: [id number] DOB: 1933/02/01 Today's Date: 03/28/2014 Time: [id number] SLP Time Calculation (min): 21 min  Assessment / Plan / Recommendation Clinical Impression  Pt. alert, pain in abdomen (RN notified).  Puree textures appeared safe, without indications of aspiration, however pt. with consistent cough following thin, nectar and honey thick liquids. Suspect edema after intubation and effects of COPD.  Recommend Dys 1, pudding thick liquids, pills whole in applesauce.  Will continue to follow.   HPI HPI: 80 y.o. female with history of CAD s/p CABG, systolic CHF, COPD, tobacco use, ESRD presenting with pneumoperitoneum.  Intubated 10/21-10/23;  10/20 s/p ex lap with colon resection and subsequent liver biopsy and creation of colostomy with abdominal closure. Acute renal failure on CKD , on CRT, continues to improve. Septic shock, improving.  Currently NPO with TPN.   Pertinent Vitals Pain Assessment: 0-10 Pain Score: 10-Worst pain ever Pain Location: abdomen Pain Intervention(s): Repositioned;Patient requesting pain meds-RN notified  SLP Plan  Continue with current plan of care    Recommendations Diet recommendations: Dysphagia 1 (puree);Pudding-thick liquid Liquids provided via: Teaspoon Medication Administration: Whole meds with puree Supervision: Patient able to self feed;Full supervision/cueing for compensatory strategies Compensations: Slow rate;Small sips/bites Postural Changes and/or Swallow Maneuvers: Seated upright 90 degrees              Oral Care Recommendations: Oral care BID Follow up Recommendations:  (TBD) Plan: Continue with current plan of care         [name], [name] 03/28/2014, 9:32 AM  [name] [name] Pager [phone]

## 2014-03-29 DIAGNOSIS — K550 Acute vascular disorders of intestine: Secondary | ICD-10-CM

## 2014-03-29 DIAGNOSIS — I442 Atrioventricular block, complete: Secondary | ICD-10-CM

## 2014-03-29 DIAGNOSIS — Z950 Presence of cardiac pacemaker: Secondary | ICD-10-CM

## 2014-03-29 DIAGNOSIS — I480 Paroxysmal atrial fibrillation: Secondary | ICD-10-CM

## 2014-03-29 DIAGNOSIS — E038 Other specified hypothyroidism: Secondary | ICD-10-CM

## 2014-03-29 DIAGNOSIS — J9621 Acute and chronic respiratory failure with hypoxia: Secondary | ICD-10-CM

## 2014-03-29 DIAGNOSIS — R69 Illness, unspecified: Secondary | ICD-10-CM

## 2014-03-29 LAB — COMPREHENSIVE METABOLIC PANEL
ALT: 20 U/L (ref 0–35)
AST: 25 U/L (ref 0–37)
Albumin: 1.9 g/dL — ABNORMAL LOW (ref 3.5–5.2)
Alkaline Phosphatase: 153 U/L — ABNORMAL HIGH (ref 39–117)
Anion gap: 14 (ref 5–15)
BUN: 33 mg/dL — ABNORMAL HIGH (ref 6–23)
CALCIUM: 8.2 mg/dL — AB (ref 8.4–10.5)
CO2: 27 meq/L (ref 19–32)
CREATININE: 2.16 mg/dL — AB (ref 0.50–1.10)
Chloride: 99 mEq/L (ref 96–112)
GFR calc Af Amer: 24 mL/min — ABNORMAL LOW (ref >90)
GFR, EST NON AFRICAN AMERICAN: 21 mL/min — AB (ref >90)
GLUCOSE: 122 mg/dL — AB (ref 70–99)
Potassium: 3.7 mEq/L (ref 3.7–5.3)
SODIUM: 140 meq/L (ref 137–147)
Total Bilirubin: 0.7 mg/dL (ref 0.3–1.2)
Total Protein: 5.2 g/dL — ABNORMAL LOW (ref 6.0–8.3)

## 2014-03-29 LAB — CBC WITH DIFFERENTIAL/PLATELET
Basophils Absolute: 0 10*3/uL (ref 0.0–0.1)
Basophils Relative: 0 % (ref 0–1)
EOS PCT: 1 % (ref 0–5)
Eosinophils Absolute: 0.1 10*3/uL (ref 0.0–0.7)
HCT: 30 % — ABNORMAL LOW (ref 36.0–46.0)
Hemoglobin: 9.7 g/dL — ABNORMAL LOW (ref 12.0–15.0)
LYMPHS ABS: 0.6 10*3/uL — AB (ref 0.7–4.0)
Lymphocytes Relative: 7 % — ABNORMAL LOW (ref 12–46)
MCH: 33.2 pg (ref 26.0–34.0)
MCHC: 32.3 g/dL (ref 30.0–36.0)
MCV: 102.7 fL — AB (ref 78.0–100.0)
MONO ABS: 1 10*3/uL (ref 0.1–1.0)
Monocytes Relative: 12 % (ref 3–12)
NEUTROS ABS: 6.9 10*3/uL (ref 1.7–7.7)
Neutrophils Relative %: 80 % — ABNORMAL HIGH (ref 43–77)
Platelets: 70 10*3/uL — ABNORMAL LOW (ref 150–400)
RBC: 2.92 MIL/uL — AB (ref 3.87–5.11)
RDW: 16.9 % — ABNORMAL HIGH (ref 11.5–15.5)
WBC: 8.6 10*3/uL (ref 4.0–10.5)

## 2014-03-29 LAB — CULTURE, BLOOD (ROUTINE X 2)
CULTURE: NO GROWTH
Culture: NO GROWTH

## 2014-03-29 LAB — MAGNESIUM: Magnesium: 1.7 mg/dL (ref 1.5–2.5)

## 2014-03-29 LAB — PHOSPHORUS: PHOSPHORUS: 1.9 mg/dL — AB (ref 2.3–4.6)

## 2014-03-29 LAB — CBC
HCT: 30.1 % — ABNORMAL LOW (ref 36.0–46.0)
Hemoglobin: 9.9 g/dL — ABNORMAL LOW (ref 12.0–15.0)
MCH: 32.5 pg (ref 26.0–34.0)
MCHC: 32.9 g/dL (ref 30.0–36.0)
MCV: 98.7 fL (ref 78.0–100.0)
PLATELETS: 62 10*3/uL — AB (ref 150–400)
RBC: 3.05 MIL/uL — AB (ref 3.87–5.11)
RDW: 16.8 % — ABNORMAL HIGH (ref 11.5–15.5)
WBC: 7.9 10*3/uL (ref 4.0–10.5)

## 2014-03-29 LAB — GLUCOSE, CAPILLARY
Glucose-Capillary: 103 mg/dL — ABNORMAL HIGH (ref 70–99)
Glucose-Capillary: 124 mg/dL — ABNORMAL HIGH (ref 70–99)

## 2014-03-29 MED ORDER — ADULT MULTIVITAMIN W/MINERALS CH
1.0000 | ORAL_TABLET | Freq: Every day | ORAL | Status: DC
  Administered 2014-03-29 – 2014-04-06 (×9): 1 via ORAL
  Filled 2014-03-29 (×9): qty 1

## 2014-03-29 MED ORDER — CLINIMIX/DEXTROSE (5/15) 5 % IV SOLN
INTRAVENOUS | Status: DC
  Administered 2014-03-29: 17:00:00 via INTRAVENOUS
  Filled 2014-03-29: qty 1000

## 2014-03-29 MED ORDER — FENTANYL CITRATE 0.05 MG/ML IJ SOLN
50.0000 ug | INTRAMUSCULAR | Status: DC | PRN
  Administered 2014-03-29 – 2014-03-31 (×16): 50 ug via INTRAVENOUS
  Filled 2014-03-29 (×17): qty 2

## 2014-03-29 MED ORDER — LORAZEPAM 2 MG/ML IJ SOLN
0.5000 mg | Freq: Once | INTRAMUSCULAR | Status: AC
  Administered 2014-03-29: 0.5 mg via INTRAVENOUS

## 2014-03-29 NOTE — Progress Notes (Signed)
Speech Language Pathology Treatment: Dysphagia  Patient Details Name: [name] MRN: [id number] DOB: 1932/07/22 Today's Date: 03/29/2014 Time: [id number] SLP Time Calculation (min): 16 min  Assessment / Plan / Recommendation Clinical Impression  Pt. More lethargic today (fentanyl ?).  Observed with honey thick liquids for differential diagnosis revealing delayed cough.  PO's limited due to pt's pain and lethargy.  Puree textures and pudding thick liquids continue to appear safe only when alert.  Will continue to follow.   HPI HPI: 80 y.o. female with history of CAD s/p CABG, systolic CHF, COPD, tobacco use, ESRD presenting with pneumoperitoneum.  Intubated 10/21-10/23;  10/20 s/p ex lap with colon resection and subsequent liver biopsy and creation of colostomy with abdominal closure. Acute renal failure on CKD , on CRT, continues to improve. Septic shock, improving.  Currently NPO with TPN.   Pertinent Vitals Pain Assessment:  (pain, RN aware) Pain Location:  (abdomen)  SLP Plan  Continue with current plan of care    Recommendations Diet recommendations: Dysphagia 1 (puree);Pudding-thick liquid Liquids provided via: Teaspoon Medication Administration: Whole meds with puree Supervision: Patient able to self feed;Full supervision/cueing for compensatory strategies Compensations: Slow rate;Small sips/bites Postural Changes and/or Swallow Maneuvers: Seated upright 90 degrees              Oral Care Recommendations: Oral care BID Follow up Recommendations: Skilled Nursing facility Plan: Continue with current plan of care    GO     [name], [name] 03/29/2014, 9:46 AM  Breck CoonsLisa [name] Maria FaceLitaker [name] Pager [phone]

## 2014-03-29 NOTE — Progress Notes (Signed)
NUTRITION FOLLOW UP  Intervention:    Recommend feeding tube placement for transition to enteral nutrition  TPN per pharmacy RD to follow for nutrition care plan  Nutrition Dx:   Inadequate oral intake now related to poor appetite as evidenced by PO intake 0%, ongoing  Goal:   Pt to meet >/= 90% of their estimated nutrition needs, currently unmet  Monitor:   EN initiation, TPN prescription, PO intake, weight, labs, I/O's  Assessment:   80 y.o. female with history of CAD s/p CABG, systolic CHF, COPD, tobacco use, ESRD presenting with pneumoperitoneum.   Patient s/p procedures 10/22: EXPLORATORY LAPAROTOMY  COLOSTOMY  LIVER BIOPSY  TPN initiated 10/21.  Patient extubated 10/23.    S/p bedside swallow evaluation 10/25.  Advanced to Dys 1, pudding thick liquid diet 10/26.  PO intake 0% per flowsheet records.  Continues to receive HD.  Patient is receiving TPN via CVC with Clinimix E 5/15 @ 30 ml/hr (per Renal Service request).  Only receiving IVFE at 10 ml/hr once per week.  Provides 579 kcal and 36 grams protein per day.  Meets 38% minimum re-estimated energy needs and 48% minimum re-estimated protein needs.  Height: Ht Readings from Last 1 Encounters:  03/26/14 5\' 3"  (1.6 m)    Weight Status:   Wt Readings from Last 1 Encounters:  03/29/14 106 lb 4.2 oz (48.2 kg)    Body mass index is 18.83 kg/(m^2).  Re-estimated needs:  Kcal: 1500-1700 Protein: 75-85 gm Fluid: per MD  Skin: surgical incision to abdomen with open wound vac  Diet Order: Dysphagia 1, pudding thick liquids   Intake/Output Summary (Last 24 hours) at 03/29/14 1219 Last data filed at 03/29/14 0138  Gross per 24 hour  Intake 616.17 ml  Output   2000 ml  Net -1383.83 ml    Labs:   Recent Labs Lab 03/26/14 0932 03/27/14 0500 03/28/14 0515  NA 132* 133* 134*  K 3.4* 3.7 3.6*  CL 94* 95* 95*  CO2 27 27 24   BUN 43* 34* 58*  CREATININE 2.22* 1.87* 2.86*  CALCIUM 8.3* 8.1* 8.2*  MG 2.1  1.7 1.8  PHOS 2.0* 2.3 2.3  GLUCOSE 112* 108* 88    CBG (last 3)   Recent Labs  03/27/14 2258 03/28/14 0616 03/29/14 0213  GLUCAP 100* 96 103*    Scheduled Meds: . antiseptic oral rinse  7 mL Mouth Rinse BID  . arformoterol  15 mcg Nebulization BID  . budesonide (PULMICORT) nebulizer solution  0.5 mg Nebulization BID  . levothyroxine  150 mcg Oral QAC breakfast  . multivitamin with minerals  1 tablet Oral Daily  . olopatadine  1 drop Both Eyes BID  . ondansetron (ZOFRAN) IV  4 mg Intravenous 3 times per day  . pantoprazole  40 mg Oral Daily  . piperacillin-tazobactam (ZOSYN)  IV  2.25 g Intravenous 3 times per day  . tiotropium  18 mcg Inhalation Daily    Continuous Infusions: . sodium chloride 10 mL/hr at 03/28/14 1901  . TPN (CLINIMIX) Adult without lytes 30 mL/hr at 03/28/14 1755  . TPN [facility](CLINIMIX) Adult without lytes      Maureen ChattersKatie Lakeesha Fontanilla, RD, LDN Pager #: [phone] After-Hours Pager #: [phone]

## 2014-03-29 NOTE — Progress Notes (Signed)
[facility] Progress Note 5 Days Post-Op  Subjective: Patient does not appear to be making much progress.  Still very sore in abdomen, breathing heavily.  Looks kind of miserable.  Objective: Vital signs in last 24 hours: Temp:  [97 F (36.1 C)-98.6 F (37 C)] 97.4 F (36.3 C) (10/27 0415) Pulse Rate:  [73-118] 73 (10/27 0415) Resp:  [18-31] 31 (10/27 0415) BP: (75-127)/(39-71) 97/48 mmHg (10/27 0415) SpO2:  [96 %-100 %] 100 % (10/27 0415) Weight:  [48.2 kg (106 lb 4.2 oz)-50.2 kg (110 lb 10.7 oz)] 48.2 kg (106 lb 4.2 oz) (10/27 0138) Last BM Date: 03/28/14  Intake/Output from previous day: 10/26 0701 - 10/27 0700 In: 616.2 [I.V.:141.7; IV Piggyback:50; TPN:424.5] Out: 2000  Intake/Output this shift:    General: Moderately uncomfortable.  Always having pain all over , even in the abdomen  Lungs: Coarse bilaterally, oxygen saturations only 93% on 3L.  Abd: Mildly distended, diffusely tender with hypoactive bowel sounds.  Wound is granulating well.  Colostomy output is not being recorded well.  Written as "1".  Actual amount of drainage needs to be recorded.  Extremities: The same  Neuro: Somnolent  Lab Results:  @LABLAST2 (wbc:2,hgb:2,hct:2,plt:2) BMET  Recent Labs  03/27/14 0500 03/28/14 0515  NA 133* 134*  K 3.7 3.6*  CL 95* 95*  CO2 27 24  GLUCOSE 108* 88  BUN 34* 58*  CREATININE 1.87* 2.86*  CALCIUM 8.1* 8.2*   PT/INR No results found for this basename: LABPROT, INR,  in the last 72 hours ABG No results found for this basename: PHART, PCO2, PO2, HCO3,  in the last 72 hours  Studies/Results: No results found.  Anti-infectives: Anti-infectives   Start     Dose/Rate Route Frequency Ordered Stop   03/25/14 2000  piperacillin-tazobactam (ZOSYN) IVPB 2.25 g     2.25 g 100 mL/hr over 30 Minutes Intravenous 3 times per day 03/25/14 1339     03/25/14 1345  piperacillin-tazobactam (ZOSYN) IVPB 2.25 g  Status:  Discontinued     2.25 g 100 mL/hr over 30 Minutes  Intravenous 3 times per day 03/25/14 1335 03/25/14 1339   03/18/2014 1400  vancomycin (VANCOCIN) 500 mg in sodium chloride 0.9 % 100 mL IVPB  Status:  Discontinued     500 mg 100 mL/hr over 60 Minutes Intravenous Every 24 hours 03/23/14 1503 03/25/14 0856   03/23/14 2100  piperacillin-tazobactam (ZOSYN) IVPB 2.25 g  Status:  Discontinued     2.25 g 100 mL/hr over 30 Minutes Intravenous 4 times per day 03/23/14 1455 03/25/14 1335   03/23/14 0500  piperacillin-tazobactam (ZOSYN) IVPB 2.25 g  Status:  Discontinued     2.25 g 100 mL/hr over 30 Minutes Intravenous 3 times per day 03/23/2014 2242 03/23/14 1455   03/14/2014 2245  [MAR Hold]  vancomycin (VANCOCIN) IVPB 750 mg/150 ml premix     (On MAR Hold since 03/23/14 0020)   750 mg 150 mL/hr over 60 Minutes Intravenous  Once 03/16/2014 2241 03/23/14 0024      Assessment/Plan: s/p Procedure(s): EXPLORATORY LAPAROTOMY COLOSTOMY I am not sure how much more we can do for this patient.  If she will not eat then should consider soft feeding tube and enteral nutrition.. Accurate and strict I's and O's need to be kept.  LOS: 7 days   Marta LamasJames O. Gae BonWyatt, III, MD, FACS [phone]--pager [phone]--office [facility] 03/29/2014

## 2014-03-29 NOTE — Progress Notes (Signed)
[facility] Progress Note   Subjective: HD yeseterday, 2L UF; post wt 48.2kg.  Anxious, appears unwell today  Filed Vitals:   03/29/14 0400 03/29/14 0415 03/29/14 0802 03/29/14 0859  BP: 100/44 97/48 123/63   Pulse: 73 73 77   Temp:  97.4 F (36.3 C) 99.7 F (37.6 C)   TempSrc:  Oral Axillary   Resp: 21 31 24    Height:      Weight:      SpO2: 99% 100% 92% 100%   Exam: Alert, no distress, nasal O2, No jvd  Clear on ant auscultation RRR 3/6 SEM no rub, distant heart sounds  Abd dressing in place,  LUQ ostomy No LE or UE edema  Neuro is alert, nonfocal,    HD: MWF [place] 3.5h   44.5kg   2/2.25 [place]  350/800  Heparin none  Profile 2 No meds  Assessment:  1 Ruptured sigmoid colon / ischemia bowel - s/p partial colectomy, colostomy found to have liver nodules as well, biopsied (both colonic and liver path are benign); Vanc/Zosyn 2 ESRD - s/p CRRT x 3 days; back on MWF schedule 3 Anemia: Off ESA. Follow Hb, probably will need ESA; Add on Iron studies today 4 Nutrition - on TNA at 30 cc/hr 5 HTPH resume sensipar when eating, no binders P in range 6 Cardiac - hx Afib / PPM / hx CABG / CM EF 15-20%  7 DNR/DNI 9 COPD  Plan- HD tomorrow.   Sabra Heckyan Giselle Brutus MD [phone]     Recent Labs Lab 03/26/14 0932 03/27/14 0500 03/28/14 0515  NA 132* 133* 134*  K 3.4* 3.7 3.6*  CL 94* 95* 95*  CO2 27 27 24   GLUCOSE 112* 108* 88  BUN 43* 34* 58*  CREATININE 2.22* 1.87* 2.86*  CALCIUM 8.3* 8.1* 8.2*  PHOS 2.0* 2.3 2.3    Recent Labs Lab 03/23/14 0448  03/21/2014 0358 03/13/2014 1600 03/25/14 0442 03/28/14 0515  AST 19  --  31  --   --  30  ALT 15  --  17  --   --  20  ALKPHOS 96  --  97  --   --  125*  BILITOT 0.5  --  0.4  --   --  0.8  PROT 4.8*  --  5.1*  --   --  5.1*  ALBUMIN 2.6*  < > 2.2* 2.2* 2.2* 2.0*  < > = values in this interval not displayed.  Recent Labs Lab 03/13/2014 0358 03/25/14 0845 03/28/14 0515 03/29/14 0530  WBC 20.3* 14.5* 7.3 7.9   NEUTROABS 19.3*  --  5.6  --   HGB 13.7 10.5* 9.5* 9.9*  HCT 42.8 33.1* 29.3* 30.1*  MCV 102.6* 103.4* 98.3 98.7  PLT 70* 41* 54* 62*   . antiseptic oral rinse  7 mL Mouth Rinse BID  . arformoterol  15 mcg Nebulization BID  . budesonide (PULMICORT) nebulizer solution  0.5 mg Nebulization BID  . levothyroxine  150 mcg Oral QAC breakfast  . multivitamin with minerals  1 tablet Oral Daily  . olopatadine  1 drop Both Eyes BID  . ondansetron (ZOFRAN) IV  4 mg Intravenous 3 times per day  . pantoprazole  40 mg Oral Daily  . piperacillin-tazobactam (ZOSYN)  IV  2.25 g Intravenous 3 times per day  . tiotropium  18 mcg Inhalation Daily   . sodium chloride 10 mL/hr at 03/28/14 1901  . TPN (CLINIMIX) Adult without lytes 30 mL/hr at 03/28/14 1755  .  TPN (CLINIMIX) Adult without lytes     albuterol, ALPRAZolam, fentaNYL, pramoxine-mineral oil-zinc, RESOURCE THICKENUP CLEAR, sodium chloride

## 2014-03-29 NOTE — Progress Notes (Signed)
Pt was not able to take spirvia

## 2014-03-29 NOTE — Progress Notes (Signed)
Pharmacy: Zosyn  81yof POD#5 ex lap/colostomy/liver biopsy continues on day #7 zosyn. She required 3 days of CRRT but is now back on her usual HD schedule MWF. Tolerated yesterday's session.  10/20 vanc> 10/23 10/20 zosyn >   10/21 Urine>> neg 10/21 Bld x2>> ngtd 10/21 Trach aspirate >> few candida albicans MRSA PCR neg  Plan: 1) Continue zosyn 2.25g IV q8 2) Follow up LOT  Louie CasaJennifer Seab Axel, PharmD, BCPS 03/29/2014, 9:48 AM

## 2014-03-29 NOTE — Progress Notes (Signed)
[facility] TEAM 1 - Stepdown/ICU TEAM Progress Note  [name] ZOX:[id number] DOB: 1932/10/10 DOA: 03/07/2014 PCP: Galvin Proffer, MD  Admit HPI / Brief Narrative: 80 y.o. WF PMHx  CAD s/p CABG, systolic CHF, COPD on 2 L of O2 24hr/dy at home, tobacco use, and ESRD on M/W/F who presented with AMS and dyspnea and was found to have a pneumoperitoneum. Difficult extubation was expected postoperatively and PCCM was consulted.      HPI/Subjective: 10/27 A/O 4, states some abdominal pain, however was sleeping and somewhat difficult to arouse when we entered her room    Assessment/Plan: Acute on chronic respiratory failure, post operative VDRF  - in pt with severe COPD and ongoing tobacco abuse  - OETT 10/21>10/23  - pt now weaned to RA  - Cont nebulized steroids and BD's, provide BiPAP as needed  Severe sepsis and septic shock, secondary to peritonitis  -- Resolved  -Patient sleeping the whole day decreased fentanyl do not increase without speaking with primary team   Perforated viscus with Intestinal ischemia  - Post laparotomy, sigmoid colectomy and liver biopsy, creation of colostomy with abdominal closure  - Aggressive pulm toilet, PT/OT  - Cont zosyn for total 7 days given stool contamination  - Wet-dressing bid to lower midline incision  - Gen Surgery following and now advancing diet  -Out of bed to chair with help  Chronic systolic CHF, compensated  - HD for volume control   History of atrial fibrillation  - rate controlled  - in NSR presently   History of complete heart block with pacemaker  CAD s/p 3v CABG   ESRD (HD on MWF)  - per Nephrology   Liver nodules  - biopsy results indicate benign process  - colon pathology negative for malignancy   Anemia of CKD  - no signs of active bleeding  - follow CBC   Thrombocytopenia  - continue holding Heparin products - Plt slowly improving  - SCD's for DVT prophylaxis   Hypothyroidism  - IV levothyroxine @ 1/2  home PO dose   Severe PCM  - secondary to acute illness with complications outlined above   Acute functional quadriplegia  - secondary to acute illness as noted above  - PT/OT evaluation    Code Status: DNR  Family Communication: No family at bedside  Disposition Plan: SDU    Consultants: Gen Surgery  Nephrology  PCCM    Procedure/Significant Events: 10/20 Ex lap and removal of necrotic bowel; on neo  10/21 On levophed and neo  10/22 Back to OR, fascial closure. Finding of multiple liver nodules not previously noted. Liver biopsy obtained  10/22 Family conference. Pt made DNR in event of cardiac arrest.  10/23 Off vasopressors, extubated with adequate resp status post extubaiton  10/24 Stable for tx to SDU  10/25 TRH assumed care     Culture resp culture 10/21 > Few C. albicans  BCx2 10/21 >> ng  UC 10/21 >> Neg    Antibiotics: Abx: Vancomycin 10/20 > 10/22  Abx: Zosyn 10/20 > stop 10/27     DVT prophylaxis: SCDs    Devices    LINES / TUBES:      Continuous Infusions: . sodium chloride 10 mL/hr at 03/28/14 1901  . TPN (CLINIMIX) Adult without lytes 30 mL/hr at 03/28/14 1755  . TPN (CLINIMIX) Adult without lytes      Objective: VITAL SIGNS: Temp: 99.7 F (37.6 C) (10/27 0802) Temp Source: Axillary (10/27 0802) BP: 123/63 mmHg (  10/27 0802) Pulse Rate: 77 (10/27 0802) SPO2; FIO2:   Intake/Output Summary (Last 24 hours) at 03/29/14 1052 Last data filed at 03/29/14 0138  Gross per 24 hour  Intake 616.17 ml  Output   2000 ml  Net -1383.83 ml     Exam: General: Difficult to arouse from her sleep, but A/O 4 once awake, No acute respiratory distress (on 2 L O2 at home) Lungs: Rhonchi  Lt> Rt without wheezes or crackles Cardiovascular: Regular rate and rhythm without murmur gallop or rub normal S1 and S2 Abdomen: Nontender, nondistended, soft, bowel sounds positive, no rebound, no ascites, no appreciable mass; midline incision site clean,  dry covered. Good granulation tissue visible without discharge. Stoma red without discharge. Extremities: No significant cyanosis, clubbing, or edema bilateral lower extremities  Data Reviewed: Basic Metabolic Panel:  Recent Labs Lab May 23, 2014 0358 May 23, 2014 1600 03/25/14 0442 03/26/14 0932 03/27/14 0500 03/28/14 0515  NA 139 135* 136* 132* 133* 134*  K 5.1 4.7 4.5 3.4* 3.7 3.6*  CL 101 101 101 94* 95* 95*  CO2 22 21 23 27 27 24   GLUCOSE 144* 105* 106* 112* 108* 88  BUN 43* 31* 22 43* 34* 58*  CREATININE 3.25* 2.20* 1.48* 2.22* 1.87* 2.86*  CALCIUM 7.3* 7.2* 7.9* 8.3* 8.1* 8.2*  MG 1.8  --  2.0 2.1 1.7 1.8  PHOS 4.6 3.4 2.3 2.0* 2.3 2.3   Liver Function Tests:  Recent Labs Lab 03/23/14 0448 03/23/14 2320 May 23, 2014 0358 May 23, 2014 1600 03/25/14 0442 03/28/14 0515  AST 19  --  31  --   --  30  ALT 15  --  17  --   --  20  ALKPHOS 96  --  97  --   --  125*  BILITOT 0.5  --  0.4  --   --  0.8  PROT 4.8*  --  5.1*  --   --  5.1*  ALBUMIN 2.6* 2.4* 2.2* 2.2* 2.2* 2.0*   No results found for this basename: LIPASE, AMYLASE,  in the last 168 hours No results found for this basename: AMMONIA,  in the last 168 hours CBC:  Recent Labs Lab 03/23/14 0448 May 23, 2014 0358 03/25/14 0845 03/28/14 0515 03/29/14 0530  WBC 2.6* 20.3* 14.5* 7.3 7.9  NEUTROABS  --  19.3*  --  5.6  --   HGB 13.4 13.7 10.5* 9.5* 9.9*  HCT 43.2 42.8 33.1* 29.3* 30.1*  MCV 105.1* 102.6* 103.4* 98.3 98.7  PLT 110* 70* 41* 54* 62*   Cardiac Enzymes:  Recent Labs Lab 03/23/2014 2257 03/23/14 0448 03/23/14 1045  TROPONINI <0.30 <0.30 <0.30   BNP (last 3 results) No results found for this basename: PROBNP,  in the last 8760 hours CBG:  Recent Labs Lab 03/27/14 0658 03/27/14 1435 03/27/14 2258 03/28/14 0616 03/29/14 0213  GLUCAP 103* 131* 100* 96 103*    Recent Results (from the past 240 hour(s))  MRSA PCR SCREENING     Status: None   Collection Time    03/23/14  2:34 AM      Result Value  Ref Range Status   MRSA by PCR NEGATIVE  NEGATIVE Final   Comment:            The GeneXpert MRSA Assay (FDA     approved for NASAL specimens     only), is one component of a     comprehensive MRSA colonization     surveillance program. It is not     intended to  diagnose MRSA     infection nor to guide or     monitor treatment for     MRSA infections.  CULTURE, BLOOD (ROUTINE X 2)     Status: None   Collection Time    03/23/14  3:23 AM      Result Value Ref Range Status   Specimen Description BLOOD LEFT HAND   Final   Special Requests BOTTLES DRAWN AEROBIC ONLY 1CC   Final   Culture  Setup Time     Final   Value: 03/23/2014 09:07     Performed at [facility]   Culture     Final   Value: NO GROWTH 5 DAYS     Performed at [facility]   Report Status 03/29/2014 FINAL   Final  CULTURE, BLOOD (ROUTINE X 2)     Status: None   Collection Time    03/23/14  3:35 AM      Result Value Ref Range Status   Specimen Description BLOOD RIGHT HAND   Final   Special Requests BOTTLES DRAWN AEROBIC ONLY 1CC   Final   Culture  Setup Time     Final   Value: 03/23/2014 09:07     Performed at [facility]   Culture     Final   Value: NO GROWTH 5 DAYS     Performed at [facility]   Report Status 03/29/2014 FINAL   Final  URINE CULTURE     Status: None   Collection Time    03/23/14  3:49 AM      Result Value Ref Range Status   Specimen Description URINE, CATHETERIZED   Final   Special Requests ADDED [id number]   Final   Culture  Setup Time     Final   Value: 03/23/2014 08:44     Performed at [facility]   Colony Count     Final   Value: NO GROWTH     Performed at [facility]   Culture     Final   Value: NO GROWTH     Performed at [facility]   Report Status 03/21/2014 FINAL   Final  CULTURE, RESPIRATORY (NON-EXPECTORATED)     Status: None   Collection Time    03/23/14 10:15 AM      Result Value Ref Range Status   Specimen  Description TRACHEAL ASPIRATE   Final   Special Requests NONE   Final   Gram Stain     Final   Value: RARE WBC PRESENT,BOTH PMN AND MONONUCLEAR     NO SQUAMOUS EPITHELIAL CELLS SEEN     RARE YEAST     Performed at [facility]   Culture     Final   Value: FEW CANDIDA ALBICANS     Performed at [facility]   Report Status 03/25/2014 FINAL   Final     Studies:  Recent x-ray studies have been reviewed in detail by the Attending Physician  Scheduled Meds:  Scheduled Meds: . antiseptic oral rinse  7 mL Mouth Rinse BID  . arformoterol  15 mcg Nebulization BID  . budesonide (PULMICORT) nebulizer solution  0.5 mg Nebulization BID  . levothyroxine  150 mcg Oral QAC breakfast  . multivitamin with minerals  1 tablet Oral Daily  . olopatadine  1 drop Both Eyes BID  . ondansetron (ZOFRAN) IV  4 mg Intravenous 3 times per day  . pantoprazole  40 mg  Oral Daily  . piperacillin-tazobactam (ZOSYN)  IV  2.25 g Intravenous 3 times per day  . tiotropium  18 mcg Inhalation Daily    Time spent on care of this patient: 40 mins   Drema DallasWOODS, CURTIS, J , MD   [facility] Office  [phone] Pager [phone]- [phone]  On-Call/Text Page:      [email or web address]      password [email or web address]  If 7PM-7AM, please contact night-coverage [email or web address] Password [email or web address] 03/29/2014, 10:52 AM   LOS: 7 days

## 2014-03-29 NOTE — Progress Notes (Signed)
[facility] NOTE   Pharmacy Consult for TPN Indication: pneumoperitoneum with bowel resection  Allergies  Allergen Reactions  . Ivp Dye [Iodinated Diagnostic Agents] Swelling  . Lisinopril Swelling  . Iohexol Itching and Swelling        Patient Measurements: Height: 5\' 3"  (160 cm) Weight: 106 lb 4.2 oz (48.2 kg) IBW/kg (Calculated) : 52.4  Vital Signs: Temp: 97.4 F (36.3 C) (10/27 0415) Temp Source: Oral (10/27 0415) BP: 97/48 mmHg (10/27 0415) Pulse Rate: 73 (10/27 0415) Intake/Output from previous day: 10/26 0701 - 10/27 0700 In: 616.2 [I.V.:141.7; IV Piggyback:50; TPN:424.5] Out: 2000  Intake/Output from this shift:    Labs:  Recent Labs  03/28/14 0515 03/29/14 0530  WBC 7.3 7.9  HGB 9.5* 9.9*  HCT 29.3* 30.1*  PLT 54* 62*     Recent Labs  03/26/14 0932 03/27/14 0500 03/28/14 0515  NA 132* 133* 134*  K 3.4* 3.7 3.6*  CL 94* 95* 95*  CO2 27 27 24   GLUCOSE 112* 108* 88  BUN 43* 34* 58*  CREATININE 2.22* 1.87* 2.86*  CALCIUM 8.3* 8.1* 8.2*  MG 2.1 1.7 1.8  PHOS 2.0* 2.3 2.3  PROT  --   --  5.1*  ALBUMIN  --   --  2.0*  AST  --   --  30  ALT  --   --  20  ALKPHOS  --   --  125*  BILITOT  --   --  0.8  PREALBUMIN  --   --  9.0*  TRIG  --   --  97   Estimated Creatinine Clearance: 12.1 ml/min (by C-G formula based on Cr of 2.86).    Recent Labs  03/27/14 2258 03/28/14 0616 03/29/14 0213  GLUCAP 100* 96 103*   Insulin Requirements in the past 24 hours:  SSI dc'd 10/26  Current Nutrition:  Clinimix 5/15 at 14mL/hr (per renal request d/t pt inability to tolerate more volume) and 20% lipid emulsion at 39ml/hr only on Wed. This will provide average of 36 g protein and 579 kcal.  Nutritional Goals:  1024kCal, 65-80 grams of protein per day per RD recommendations 10/21  (Re-estimated for patient on CRRT- 2000-2200kcal, 75-85g protein, however CRRT stopped 10/23 PM)  Admit: 29 YOF admitted 10/20 after transfer from  [facility] with perforated bowel - CT showed free air and thickened small bowel. She was taken to OR 10/20 PM for ex lap, application of open wound VAC and sigmoid colon resection.  GI: hx duodenal ulcers; POD#5 ex lap, sigmoid colon resection. Went back to OR 10/22 for re-exp, colostomy - also found to have several liver nodules suspicious for malignancy which were biopsied. Baseline prealbumin 14.1 (slightly low) and now decreased to 9. NGT now out as patient displaced during agitation while on HD. Pt c/o sore abdomen. Diet advanced to dysphagia diet however, no PO intake documented. Surgery recommended feeding tube for enteral nutrition.  Endo: hx hypothyroidism, TSH 1.6 on admit, continued on home Synthroid . No noted history of DM, no A1C on file. CBGs low-nl since starting TPN; change IV synthroid to PO Lytes: NO LYTES IN TPN - BMET not checked today Renal: ESRD on HD MWF with extra HD Sat 10/24 due to TPN. Renal started CRRT 10/21 PM, then stopped 10/23. Now undergoing iHD- last session 10/26, tolerated 3.5 hr at BFR 400. TPN at 30 ml/hr per renal request. Cards: CAD s/p CABG in 2008, HF with EF 25-30%, afib.  Hepatobil: LFTs remain WNL  x alk phos slightly elevated at 125. Albumin low at 2.0. Baseline TG 199, now trig 97 - remains anemic and thrombocytopenic, no overt bleeding noted ID: Continues on Zosyn D#7 for bowel perf. Blood and urine cultures with no growth to date, trach aspirate with few candida - Afebrile, WBC WNL Best Practices: SCDs for VTE proph, MC TPN Access: CVC double lumen left IJ placed 10/20 TPN day#: 6 (started 10/21)  Plan: - Continue Clinimix 5/15 (NO ELECTROLYTES) at 80mL/hr per renal MD request. Will only give IVFE once per week (on Wed). This will provide 36g protein daily and an average of 579 kcal/day- cannot concentrate TPN any further to provide more nutrients without providing excess fluid. - Change multivitamin and trace elements to PO - pt is taking PO  medications - Maintenance fluids per CCM and Renal - Please evaluate need for a feeding tube - Prealbumin is worsening since we are unable to meet nutrition requirements due to fluid limitations  Salome Arnt, PharmD, BCPS Pager # [phone] 03/29/2014 8:14 AM

## 2014-03-30 LAB — CBC WITH DIFFERENTIAL/PLATELET
BASOS PCT: 0 % (ref 0–1)
Basophils Absolute: 0 10*3/uL (ref 0.0–0.1)
EOS ABS: 0.2 10*3/uL (ref 0.0–0.7)
EOS PCT: 2 % (ref 0–5)
HCT: 28.9 % — ABNORMAL LOW (ref 36.0–46.0)
Hemoglobin: 9.4 g/dL — ABNORMAL LOW (ref 12.0–15.0)
LYMPHS ABS: 0.6 10*3/uL — AB (ref 0.7–4.0)
Lymphocytes Relative: 6 % — ABNORMAL LOW (ref 12–46)
MCH: 32.5 pg (ref 26.0–34.0)
MCHC: 32.5 g/dL (ref 30.0–36.0)
MCV: 100 fL (ref 78.0–100.0)
MONO ABS: 0.6 10*3/uL (ref 0.1–1.0)
Monocytes Relative: 6 % (ref 3–12)
NEUTROS PCT: 86 % — AB (ref 43–77)
Neutro Abs: 8.4 10*3/uL — ABNORMAL HIGH (ref 1.7–7.7)
Platelets: 86 10*3/uL — ABNORMAL LOW (ref 150–400)
RBC: 2.89 MIL/uL — ABNORMAL LOW (ref 3.87–5.11)
RDW: 17.1 % — ABNORMAL HIGH (ref 11.5–15.5)
WBC: 9.8 10*3/uL (ref 4.0–10.5)

## 2014-03-30 LAB — COMPREHENSIVE METABOLIC PANEL
ALBUMIN: 1.8 g/dL — AB (ref 3.5–5.2)
ALK PHOS: 168 U/L — AB (ref 39–117)
ALT: 19 U/L (ref 0–35)
ANION GAP: 15 (ref 5–15)
AST: 30 U/L (ref 0–37)
BUN: 45 mg/dL — AB (ref 6–23)
CALCIUM: 8.3 mg/dL — AB (ref 8.4–10.5)
CO2: 26 mEq/L (ref 19–32)
CREATININE: 2.86 mg/dL — AB (ref 0.50–1.10)
Chloride: 99 mEq/L (ref 96–112)
GFR calc non Af Amer: 15 mL/min — ABNORMAL LOW (ref >90)
GFR, EST AFRICAN AMERICAN: 17 mL/min — AB (ref >90)
GLUCOSE: 105 mg/dL — AB (ref 70–99)
POTASSIUM: 3.6 meq/L — AB (ref 3.7–5.3)
Sodium: 140 mEq/L (ref 137–147)
Total Bilirubin: 0.5 mg/dL (ref 0.3–1.2)
Total Protein: 5 g/dL — ABNORMAL LOW (ref 6.0–8.3)

## 2014-03-30 LAB — MAGNESIUM: Magnesium: 1.8 mg/dL (ref 1.5–2.5)

## 2014-03-30 LAB — GLUCOSE, CAPILLARY
Glucose-Capillary: 100 mg/dL — ABNORMAL HIGH (ref 70–99)
Glucose-Capillary: 106 mg/dL — ABNORMAL HIGH (ref 70–99)

## 2014-03-30 LAB — PHOSPHORUS: Phosphorus: 2.3 mg/dL (ref 2.3–4.6)

## 2014-03-30 MED ORDER — MEGESTROL ACETATE 400 MG/10ML PO SUSP
400.0000 mg | Freq: Two times a day (BID) | ORAL | Status: DC
  Administered 2014-03-30 – 2014-04-06 (×14): 400 mg via ORAL
  Filled 2014-03-30 (×15): qty 10

## 2014-03-30 MED ORDER — ALTEPLASE 2 MG IJ SOLR
2.0000 mg | Freq: Once | INTRAMUSCULAR | Status: AC | PRN
  Filled 2014-03-30: qty 2

## 2014-03-30 MED ORDER — NEPRO/CARBSTEADY PO LIQD: 237.0000 mL | ORAL | Status: DC | PRN

## 2014-03-30 MED ORDER — SODIUM CHLORIDE 0.9 % IV SOLN: 100.0000 mL | INTRAVENOUS | Status: DC | PRN

## 2014-03-30 MED ORDER — PENTAFLUOROPROP-TETRAFLUOROETH EX AERO: 1.0000 "application " | INHALATION_SPRAY | CUTANEOUS | Status: DC | PRN

## 2014-03-30 MED ORDER — CLINIMIX/DEXTROSE (5/15) 5 % IV SOLN
INTRAVENOUS | Status: AC
  Administered 2014-03-30: 18:00:00 via INTRAVENOUS
  Filled 2014-03-30 (×2): qty 1000

## 2014-03-30 MED ORDER — CLINIMIX/DEXTROSE (5/15) 5 % IV SOLN
INTRAVENOUS | Status: DC
  Filled 2014-03-30: qty 1000

## 2014-03-30 MED ORDER — LIDOCAINE HCL (PF) 1 % IJ SOLN: 5.0000 mL | INTRAMUSCULAR | Status: DC | PRN

## 2014-03-30 MED ORDER — HEPARIN SODIUM (PORCINE) 1000 UNIT/ML DIALYSIS: 1000.0000 [IU] | INTRAMUSCULAR | Status: DC | PRN

## 2014-03-30 MED ORDER — LIDOCAINE-PRILOCAINE 2.5-2.5 % EX CREA: 1.0000 "application " | TOPICAL_CREAM | CUTANEOUS | Status: DC | PRN

## 2014-03-30 NOTE — Progress Notes (Signed)
Pt just arrived to rm 6e03 via bed. No ss of distress noted at this time. Pt settled in room, and denies needs at present.

## 2014-03-30 NOTE — Progress Notes (Signed)
  [facility] Progress Note   Subjective:  Now having more output from colostomy. Continues to have difficulty with pain control and anxiety. Family updated yesterday afternoon. For hemodialysis today. Continues on TPN.  Filed Vitals:   03/30/14 0600 03/30/14 0837 03/30/14 0922 03/30/14 1000  BP: 98/39 110/45  114/41  Pulse: 73 76  81  Temp:  98.1 F (36.7 C)    TempSrc:  Oral    Resp: 18 23  29   Height:      Weight:      SpO2: 93% 99% 100% 100%   Exam: Alert, anxious appearing, nasal O2, No jvd  Clear on ant auscultation RRR 3/6 SEM no rub, distant heart sounds  Abd dressing in place,  LUQ ostomy with liquid and back. No LE or UE edema  Neuro is alert, nonfocal,    HD: MWF [place] 3.5h   44.5kg   2/2.25 [place]  350/800  Heparin none  Profile 2 No meds  Assessment and plan: 1 Ruptured sigmoid colon / ischemia bowel - s/p partial colectomy, colostomy found to have liver nodules as well, biopsied (both colonic and liver path are benign); antibiotics per surgery and hospitalist. Having more ostomy output now. 2 ESRD - s/p CRRT x 3 days; back on MWF schedule.  Use [facility] today. [facility]. 3 Anemia: Off ESA. Follow Hb, probably will need ESA;  4 Nutrition - on TNA at 30 cc/hr.  Very poor by mouth intake. 5 HTPH resume sensipar when eating, no binders P remains low/normal 6 Cardiac - hx Afib / PPM / hx CABG / CM EF 15-20%  7 DNR/DNI 9 COPD  Sabra Heckyan Allex Lapoint MD [phone]     Recent Labs Lab 03/28/14 0515 03/29/14 1130 03/30/14 0420  NA 134* 140 140  K 3.6* 3.7 3.6*  CL 95* 99 99  CO2 24 27 26   GLUCOSE 88 122* 105*  BUN 58* 33* 45*  CREATININE 2.86* 2.16* 2.86*  CALCIUM 8.2* 8.2* 8.3*  PHOS 2.3 1.9* 2.3    Recent Labs Lab 03/28/14 0515 03/29/14 1130 03/30/14 0420  AST 30 25 30   ALT 20 20 19   ALKPHOS 125* 153* 168*  BILITOT 0.8 0.7 0.5  PROT 5.1* 5.2* 5.0*  ALBUMIN 2.0* 1.9* 1.8*    Recent Labs Lab 03/28/14 0515  03/29/14 0530 03/29/14 1130 03/30/14 0420  WBC 7.3 7.9 8.6 9.8  NEUTROABS 5.6  --  6.9 8.4*  HGB 9.5* 9.9* 9.7* 9.4*  HCT 29.3* 30.1* 30.0* 28.9*  MCV 98.3 98.7 102.7* 100.0  PLT 54* 62* 70* 86*   . antiseptic oral rinse  7 mL Mouth Rinse BID  . arformoterol  15 mcg Nebulization BID  . budesonide (PULMICORT) nebulizer solution  0.5 mg Nebulization BID  . levothyroxine  150 mcg Oral QAC breakfast  . multivitamin with minerals  1 tablet Oral Daily  . olopatadine  1 drop Both Eyes BID  . ondansetron (ZOFRAN) IV  4 mg Intravenous 3 times per day  . pantoprazole  40 mg Oral Daily  . piperacillin-tazobactam (ZOSYN)  IV  2.25 g Intravenous 3 times per day  . tiotropium  18 mcg Inhalation Daily   . sodium chloride 10 mL/hr at 03/28/14 1901  . TPN (CLINIMIX) Adult without lytes 30 mL/hr at 03/29/14 1728  . TPN (CLINIMIX) Adult without lytes     albuterol, ALPRAZolam, fentaNYL, pramoxine-mineral oil-zinc, RESOURCE THICKENUP CLEAR, sodium chloride

## 2014-03-30 NOTE — Progress Notes (Signed)
Discussed TPN discontinuation with Dr. Sharon SellerMcClung.  Since her TPN bag was already made for this evening to be hung at 1800, we will use this as the last bag and not continue TPN past that bag expiring (10/29 at 1759).  Plan: -Clinimix 5/15 NO LYTES at 3730mL/hr per renal MD request-- to run from 10/28 at 1800 to 10/29 at 1759. -discontinue TPN at that point- no need to taper further since low rate and no insulin in TPN bag -will discontinue all TPN labs   All discussed and agreed to by Dr. Sharon SellerMcClung. Pharmacy signed off. Please re-consult if needed   Daltin Crist D. Iley Breeden, PharmD, BCPS Clinical Pharmacist Pager: [phone] 03/30/2014 4:18 PM

## 2014-03-30 NOTE — Consult Note (Signed)
WOC ostomy follow up  Met with patient, her sister and nephew at the bedside. They report that Ms. Bally has PCS worker that stays with her during the day time hours and that her daughter [name] lives next door to her.  At this point I do not believe that I can teach Ms. Schicker to care for her ostomy. She is so sleepy or in pain whenever I visit.  Stoma type/location: LLQ, end colosotmy Output large volume liquid green stool in the pouch  Ostomy pouching: 2pc. In place, just changed this am per bedside nursing staff.  Education provided:  Discussed care of her ostomy with family in the room, they report that the CNA that stays with her reported to already be familiar with care and that her daughter would be the point of contact for ostomy educational session.  WOC will continue to follow along with you for ostomy care and support.     RN,CWOCN [phone] 

## 2014-03-30 NOTE — Progress Notes (Signed)
PT Cancellation Note  Patient Details Name: Maria LefevreVera M [name] MRN: [id number] DOB: [birth year]   Cancelled Treatment:    Reason Eval/Treat Not Completed: Medical issues which prohibited therapy.  Noted pt with Temporary R Femoral HD Catheter, so deferred mobility at this time.  Spoke with RN and SLP about positioning for swallow precautions as pt only allowed to minimally flex R hip.  SLP to f/u.  Will hold PT until pt no longer has temporary femoral cath.     Taite Baldassari, Alison MurrayMegan F 03/30/2014, 8:56 AM

## 2014-03-30 NOTE — Progress Notes (Signed)
[facility] Progress Note 6 Days Post-Op  Subjective: Patient still not eating and renal function is worsening.  She has no appetite and is not eating.  Still on TPN.  Objective: Vital signs in last 24 hours: Temp:  [97.6 F (36.4 C)-99.7 F (37.6 C)] 98 F (36.7 C) (10/28 0318) Pulse Rate:  [70-80] 80 (10/28 0400) Resp:  [17-28] 20 (10/28 0400) BP: (103-131)/(45-63) 121/49 mmHg (10/28 0400) SpO2:  [92 %-100 %] 100 % (10/28 0400) Weight:  [47.7 kg (105 lb 2.6 oz)] 47.7 kg (105 lb 2.6 oz) (10/28 0318) Last BM Date: 03/29/14  Intake/Output from previous day: 10/27 0701 - 10/28 0700 In: 0  Out: 676 [Stool:676] Intake/Output this shift:    General: No acute distress but complaining of abdominal pain.    Lungs: coarse breath sounds bilaterally.  Abd: Mildly distended, good bowel sounds.  Bilious output in colostomy bag.  Stoma is still edematous.  Extremities: No changes  Neuro: Intact  Lab Results:  @LABLAST2 (wbc:2,hgb:2,hct:2,plt:2) BMET  Recent Labs  03/29/14 1130 03/30/14 0420  NA 140 140  K 3.7 3.6*  CL 99 99  CO2 27 26  GLUCOSE 122* 105*  BUN 33* 45*  CREATININE 2.16* 2.86*  CALCIUM 8.2* 8.3*   PT/INR No results found for this basename: LABPROT, INR,  in the last 72 hours ABG No results found for this basename: PHART, PCO2, PO2, HCO3,  in the last 72 hours  Studies/Results: No results found.  Anti-infectives: Anti-infectives   Start     Dose/Rate Route Frequency Ordered Stop   03/25/14 2000  piperacillin-tazobactam (ZOSYN) IVPB 2.25 g     2.25 g 100 mL/hr over 30 Minutes Intravenous 3 times per day 03/25/14 1339     03/25/14 1345  piperacillin-tazobactam (ZOSYN) IVPB 2.25 g  Status:  Discontinued     2.25 g 100 mL/hr over 30 Minutes Intravenous 3 times per day 03/25/14 1335 03/25/14 1339   03/06/2014 1400  vancomycin (VANCOCIN) 500 mg in sodium chloride 0.9 % 100 mL IVPB  Status:  Discontinued     500 mg 100 mL/hr over 60 Minutes Intravenous Every  24 hours 03/23/14 1503 03/25/14 0856   03/23/14 2100  piperacillin-tazobactam (ZOSYN) IVPB 2.25 g  Status:  Discontinued     2.25 g 100 mL/hr over 30 Minutes Intravenous 4 times per day 03/23/14 1455 03/25/14 1335   03/23/14 0500  piperacillin-tazobactam (ZOSYN) IVPB 2.25 g  Status:  Discontinued     2.25 g 100 mL/hr over 30 Minutes Intravenous 3 times per day 03/31/2014 2242 03/23/14 1455   03/10/2014 2245  [MAR Hold]  vancomycin (VANCOCIN) IVPB 750 mg/150 ml premix     (On MAR Hold since 03/23/14 0020)   750 mg 150 mL/hr over 60 Minutes Intravenous  Once 03/12/2014 2241 03/23/14 0024      Assessment/Plan: s/p Procedure(s): EXPLORATORY LAPAROTOMY COLOSTOMY Patient not getting adequate nutrition and will not take orally.  May need to pass feeding tube and put patient on tube feedings.  Parenteral nutrition is not adequate.  Also, patient getting behind on volume although it is difficult to tell this from the I's and O's kept on the chare Consider Panda tube and enteral feedings. WBC normal and there is no objective finding to suggest intra-abdominal process leading to her chronic abdominal pain.  LOS: 8 days   Marta LamasJames O. Gae BonWyatt, III, MD, FACS [phone]--pager [phone]--office [facility] 03/30/2014

## 2014-03-30 NOTE — Progress Notes (Signed)
[place] NOTE   Pharmacy Consult for TPN Indication: pneumoperitoneum with bowel resection  Allergies  Allergen Reactions  . Ivp Dye [Iodinated Diagnostic Agents] Swelling  . Lisinopril Swelling  . Iohexol Itching and Swelling        Patient Measurements: Height: 5\' 3"  (160 cm) Weight: 105 lb 2.6 oz (47.7 kg) IBW/kg (Calculated) : 52.4  Vital Signs: Temp: 98 F (36.7 C) (10/28 0318) Temp Source: Axillary (10/28 0318) BP: 121/49 mmHg (10/28 0400) Pulse Rate: 80 (10/28 0400) Intake/Output from previous day: 10/27 0701 - 10/28 0700 In: 0  Out: 676 [Stool:676] Intake/Output from this shift:    Labs:  Recent Labs  03/29/14 0530 03/29/14 1130 03/30/14 0420  WBC 7.9 8.6 9.8  HGB 9.9* 9.7* 9.4*  HCT 30.1* 30.0* 28.9*  PLT 62* 70* 86*     Recent Labs  03/28/14 0515 03/29/14 1130 03/30/14 0420  NA 134* 140 140  K 3.6* 3.7 3.6*  CL 95* 99 99  CO2 24 27 26   GLUCOSE 88 122* 105*  BUN 58* 33* 45*  CREATININE 2.86* 2.16* 2.86*  CALCIUM 8.2* 8.2* 8.3*  MG 1.8 1.7 1.8  PHOS 2.3 1.9* 2.3  PROT 5.1* 5.2* 5.0*  ALBUMIN 2.0* 1.9* 1.8*  AST 30 25 30   ALT 20 20 19   ALKPHOS 125* 153* 168*  BILITOT 0.8 0.7 0.5  PREALBUMIN 9.0*  --   --   TRIG 97  --   --    Estimated Creatinine Clearance: 12 ml/min (by C-G formula based on Cr of 2.86).    Recent Labs  03/29/14 0213 03/29/14 1636 03/29/14 2351  GLUCAP 103* 124* 106*   Insulin Requirements in the past 24 hours:  SSI dc'd 10/26  Current Nutrition:  Clinimix 5/15 at 78mL/hr (per renal request d/t pt inability to tolerate more volume) and 20% lipid emulsion at 43ml/hr only on Wed. This will provide average of 36 g protein and 579 kcal.  Nutritional Goals:  1500-1700 kCal, 75-85 grams of protein per day per RD recommendations 10/27  Admit: [name] admitted 10/20 after transfer from [facility] with perforated bowel - CT showed free air and thickened small bowel. She was taken to OR 10/20 PM for  ex lap, application of open wound VAC and sigmoid colon resection.  GI: hx duodenal ulcers; POD#6 ex lap, sigmoid colon resection. Went back to OR 10/22 for re-exp, colostomy - also found to have several liver nodules suspicious for malignancy which were biopsied. Baseline prealbumin 14.1 (slightly low) and now decreased to 9. NGT now out as patient displaced during agitation while on HD. Pt c/o sore abdomen. Diet advanced to dysphagia diet however, no PO intake documented. Surgery recommended feeding tube for enteral nutrition.  Endo: hx hypothyroidism, TSH 1.6 on admit, continued on home Synthroid . No noted history of DM, no A1C on file. CBGs low-nl since starting TPN; change IV synthroid to PO Lytes: NO LYTES IN TPN - Na 140, K 3.6, Mag 1.8, Phos 2.3 Renal: ESRD on HD MWF with extra HD Sat 10/24 due to TPN. Renal started CRRT 10/21 PM, then stopped 10/23. Now undergoing iHD- last session 10/26, tolerated 3.5 hr at BFR 400. TPN at 30 ml/hr per renal request. Cards: CAD s/p CABG in 2008, HF with EF 25-30%, afib.  Hepatobil: LFTs remain WNL x alk phos elevated. Albumin low at 1.8. Baseline TG 199, now trig 97 - remains anemic and thrombocytopenic, no overt bleeding noted ID: Continues on Zosyn D#8 for bowel  perf. Blood and urine cultures with no growth to date, trach aspirate with few candida - Afebrile, WBC WNL Best Practices: SCDs for VTE proph, MC TPN Access: CVC double lumen left IJ placed 10/20 TPN day#: 7 (started 10/21)  Plan: - Continue Clinimix 5/15 (NO ELECTROLYTES) at 51mL/hr per renal MD request. Will only give IVFE once per week (on Wed). This will provide 36g protein daily and an average of 579 kcal/day- cannot concentrate TPN any further to provide more nutrients without providing excess fluid. - Change multivitamin and trace elements to PO - pt is taking PO medications - Maintenance fluids per CCM and Renal - Please evaluate need for a feeding tube - Prealbumin is worsening since we  are unable to meet nutrition requirements due to fluid limitations  Excell Seltzer, PharmD Pager # [phone] 03/30/2014 7:35 AM

## 2014-03-30 NOTE — Progress Notes (Signed)
[place] TEAM 1 - Stepdown/ICU TEAM Progress Note  [name]:[id number] DOB: August 14, 1932 DOA: 04-13-14 PCP: Galvin Proffer, MD  Admit HPI / Brief Narrative: 80 y.o. female with history of CAD s/p CABG, systolic CHF, COPD, tobacco use, and ESRD who presented with AMS and dyspnea and was found to have a pneumoperitoneum. Difficult extubation was expected postoperatively and PCCM was consulted.   Significant Events:  10/20 Ex lap and removal of necrotic bowel; on neo  10/21 On levophed and neo  10/22 Back to OR, fascial closure. Finding of multiple liver nodules not previously noted. Liver biopsy obtained  10/22 Family conference. Pt made DNR in event of cardiac arrest.  10/23 Off vasopressors, extubated with adequate resp status post extubaiton  10/24 Stable for tx to SDU  10/25 [facility] assumed care   Since the hospitalist have assume the patient's care the primary focus has been advancement of her diet/nutritional status.  Her respiratory status remains stable.  Her peritonitis has essentially resolved.  Her chronic health problems are currently well compensated.  She is being transferred to the renal floor today to focus on improving oral intake/advancing nutrition, and initiating what will prove to be a retracted rehabilitation process.  HPI/Subjective: Patient is alert and conversant.  She tells me she does not wish to consider placement of an NG tube for feeding.  She complains of back pain and abdominal pain at her surgical site.  She admits that she has a very poor appetite.  She denies chest pain or shortness of breath.  Assessment/Plan:  Acute on chronic respiratory failure, post operative VDRF  - in pt with severe COPD and ongoing tobacco abuse  - OETT 10/21>10/23  - pt now weaned to RA and stable - Cont nebulized steroids and BD's  Severe sepsis and septic shock, secondary to peritonitis  - Resolved  - resp culture 10/21 > Few C. albicans  - BCx2 10/21 >> ng  - UC  10/21 >> Neg  - Abx: Vancomycin 10/20 > 10/22  - Abx: Zosyn 10/20 > stop 10/27   Perforated sigmoid colon due to Intestinal ischemia  - Post laparotomy, sigmoid colectomy and liver biopsy, creation of colostomy with abdominal closure  - PT/OT once femoral dialysis catheter able to be discontinued - Zosyn for total 7 days completed given stool contamination  - Wet-dressing bid to lower midline incision  - Gen Surgery following and advancing diet   Chronic systolic CHF, compensated  - HD for volume control   History of atrial fibrillation  - rate controlled - in NSR presently   History of complete heart block with pacemaker   CAD s/p 3v CABG  -Denies anginal type chest pain  ESRD (HD on MWF)  - per Nephrology - currently utilizing femoral temporary dialysis catheter with plan to reuse AV fistula today - if this is successful femoral catheter will be removed making PT/OT possible  Liver nodules  - biopsy results indicate benign process - colon pathology negative for malignancy   Anemia of CKD  - no signs of active bleeding  - follow CBC   Thrombocytopenia  - continue holding Heparin products - Plt slowly improving  - SCD's for DVT prophylaxis   Hypothyroidism  - Continue home Synthroid dose   Severe PCM  - secondary to acute illness with complications outlined above - patient states she is not interested in having an NG tube placed - I have explained her the extreme importance of adequate nutrition - I  will initiate Megace  Acute functional quadriplegia  - secondary to acute illness as noted above  - PT/OT evaluation once femoral dialysis catheter removed  Code Status: DNR Family Communication: No family present at time of exam today Disposition Plan: Transfer to renal floor - encourage increased oral intake - initiate PT/OT when femoral dialysis catheter removed  Consultants: Gen Surgery  Nephrology  PCCM   Antibiotics: Noted above   DVT  prophylaxis: SCDs  Objective: Blood pressure 105/56, pulse 74, temperature 97.9 F (36.6 C), temperature source Oral, resp. rate 25, height 5\' 3"  (1.6 m), weight 47.7 kg (105 lb 2.6 oz), SpO2 98.00%.  Intake/Output Summary (Last 24 hours) at 03/30/14 1517 Last data filed at 03/30/14 1321  Gross per 24 hour  Intake   1040 ml  Output   1025 ml  Net     15 ml   Exam: General: No acute respiratory distress - alert and conversant  Lungs: poor air movement th/o all fields - no active wheeze  Cardiovascular: Regular rate and rhythm without murmur gallop or rub  Abdomen: soft, bowel sounds hypoactive, no appreciable mass - ostomy unremarkable - midline wound dressed and dry  Extremities: No significant cyanosis, clubbing, or edema bilateral lower extremities  Data Reviewed: Basic Metabolic Panel:  Recent Labs Lab 03/26/14 0932 03/27/14 0500 03/28/14 0515 03/29/14 1130 03/30/14 0420  NA 132* 133* 134* 140 140  K 3.4* 3.7 3.6* 3.7 3.6*  CL 94* 95* 95* 99 99  CO2 27 27 24 27 26   GLUCOSE 112* 108* 88 122* 105*  BUN 43* 34* 58* 33* 45*  CREATININE 2.22* 1.87* 2.86* 2.16* 2.86*  CALCIUM 8.3* 8.1* 8.2* 8.2* 8.3*  MG 2.1 1.7 1.8 1.7 1.8  PHOS 2.0* 2.3 2.3 1.9* 2.3    Liver Function Tests:  Recent Labs Lab 09-14-2013 0358 09-14-2013 1600 03/25/14 0442 03/28/14 0515 03/29/14 1130 03/30/14 0420  AST 31  --   --  30 25 30   ALT 17  --   --  20 20 19   ALKPHOS 97  --   --  125* 153* 168*  BILITOT 0.4  --   --  0.8 0.7 0.5  PROT 5.1*  --   --  5.1* 5.2* 5.0*  ALBUMIN 2.2* 2.2* 2.2* 2.0* 1.9* 1.8*   Coags: No results found for this basename: PT, INR,  in the last 168 hours  Recent Labs Lab 09-14-2013 0358 03/25/14 0442  APTT 33 35   CBC:  Recent Labs Lab 09-14-2013 0358 03/25/14 0845 03/28/14 0515 03/29/14 0530 03/29/14 1130 03/30/14 0420  WBC 20.3* 14.5* 7.3 7.9 8.6 9.8  NEUTROABS 19.3*  --  5.6  --  6.9 8.4*  HGB 13.7 10.5* 9.5* 9.9* 9.7* 9.4*  HCT 42.8 33.1* 29.3*  30.1* 30.0* 28.9*  MCV 102.6* 103.4* 98.3 98.7 102.7* 100.0  PLT 70* 41* 54* 62* 70* 86*   CBG:  Recent Labs Lab 03/28/14 0616 03/29/14 0213 03/29/14 1636 03/29/14 2351 03/30/14 0948  GLUCAP 96 103* 124* 106* 100*    Recent Results (from the past 240 hour(s))  MRSA PCR SCREENING     Status: None   Collection Time    03/23/14  2:34 AM      Result Value Ref Range Status   MRSA by PCR NEGATIVE  NEGATIVE Final   Comment:            The GeneXpert MRSA Assay (FDA     approved for NASAL specimens     only),  is one component of a     comprehensive MRSA colonization     surveillance program. It is not     intended to diagnose MRSA     infection nor to guide or     monitor treatment for     MRSA infections.  CULTURE, BLOOD (ROUTINE X 2)     Status: None   Collection Time    03/23/14  3:23 AM      Result Value Ref Range Status   Specimen Description BLOOD LEFT HAND   Final   Special Requests BOTTLES DRAWN AEROBIC ONLY 1CC   Final   Culture  Setup Time     Final   Value: 03/23/2014 09:07     Performed at [facility]   Culture     Final   Value: NO GROWTH 5 DAYS     Performed at [facility]   Report Status 03/29/2014 FINAL   Final  CULTURE, BLOOD (ROUTINE X 2)     Status: None   Collection Time    03/23/14  3:35 AM      Result Value Ref Range Status   Specimen Description BLOOD RIGHT HAND   Final   Special Requests BOTTLES DRAWN AEROBIC ONLY 1CC   Final   Culture  Setup Time     Final   Value: 03/23/2014 09:07     Performed at [facility]   Culture     Final   Value: NO GROWTH 5 DAYS     Performed at [facility]   Report Status 03/29/2014 FINAL   Final  URINE CULTURE     Status: None   Collection Time    03/23/14  3:49 AM      Result Value Ref Range Status   Specimen Description URINE, CATHETERIZED   Final   Special Requests ADDED [id number]   Final   Culture  Setup Time     Final   Value: 03/23/2014 08:44     Performed at  [facility]   Colony Count     Final   Value: NO GROWTH     Performed at [facility]   Culture     Final   Value: NO GROWTH     Performed at [facility]   Report Status 03/23/2014 FINAL   Final  CULTURE, RESPIRATORY (NON-EXPECTORATED)     Status: None   Collection Time    03/23/14 10:15 AM      Result Value Ref Range Status   Specimen Description TRACHEAL ASPIRATE   Final   Special Requests NONE   Final   Gram Stain     Final   Value: RARE WBC PRESENT,BOTH PMN AND MONONUCLEAR     NO SQUAMOUS EPITHELIAL CELLS SEEN     RARE YEAST     Performed at [facility]   Culture     Final   Value: FEW CANDIDA ALBICANS     Performed at [facility]   Report Status 03/25/2014 FINAL   Final     Studies:  Recent x-ray studies have been reviewed in detail by the Attending Physician  Scheduled Meds:  Scheduled Meds: . antiseptic oral rinse  7 mL Mouth Rinse BID  . arformoterol  15 mcg Nebulization BID  . budesonide (PULMICORT) nebulizer solution  0.5 mg Nebulization BID  . levothyroxine  150 mcg Oral QAC breakfast  . multivitamin with minerals  1 tablet Oral Daily  .  olopatadine  1 drop Both Eyes BID  . ondansetron (ZOFRAN) IV  4 mg Intravenous 3 times per day  . pantoprazole  40 mg Oral Daily  . piperacillin-tazobactam (ZOSYN)  IV  2.25 g Intravenous 3 times per day  . tiotropium  18 mcg Inhalation Daily    Time spent on care of this patient: 35 mins   Tracie Dore T , MD   [facility] Office  [phone] Pager - Text Page per Loretha StaplerAmion as per below:  On-Call/Text Page:      [email or web address]      password [email or web address]  If 7PM-7AM, please contact night-coverage [email or web address] Password [email or web address] 03/30/2014, 3:17 PM   LOS: 8 days

## 2014-03-31 ENCOUNTER — Inpatient Hospital Stay (HOSPITAL_COMMUNITY): Payer: Medicare Other

## 2014-03-31 DIAGNOSIS — R109 Unspecified abdominal pain: Secondary | ICD-10-CM

## 2014-03-31 DIAGNOSIS — R1012 Left upper quadrant pain: Secondary | ICD-10-CM

## 2014-03-31 DIAGNOSIS — I5023 Acute on chronic systolic (congestive) heart failure: Secondary | ICD-10-CM

## 2014-03-31 DIAGNOSIS — R1084 Generalized abdominal pain: Secondary | ICD-10-CM

## 2014-03-31 DIAGNOSIS — R0600 Dyspnea, unspecified: Secondary | ICD-10-CM

## 2014-03-31 LAB — BASIC METABOLIC PANEL
Anion gap: 16 — ABNORMAL HIGH (ref 5–15)
BUN: 62 mg/dL — AB (ref 6–23)
CALCIUM: 8.7 mg/dL (ref 8.4–10.5)
CHLORIDE: 97 meq/L (ref 96–112)
CO2: 25 mEq/L (ref 19–32)
Creatinine, Ser: 3.83 mg/dL — ABNORMAL HIGH (ref 0.50–1.10)
GFR calc non Af Amer: 10 mL/min — ABNORMAL LOW (ref >90)
GFR, EST AFRICAN AMERICAN: 12 mL/min — AB (ref >90)
Glucose, Bld: 104 mg/dL — ABNORMAL HIGH (ref 70–99)
Potassium: 3.6 mEq/L — ABNORMAL LOW (ref 3.7–5.3)
Sodium: 138 mEq/L (ref 137–147)

## 2014-03-31 MED ORDER — SODIUM CHLORIDE 0.9 % IV SOLN: 100.0000 mL | INTRAVENOUS | Status: DC | PRN

## 2014-03-31 MED ORDER — FENTANYL CITRATE 0.05 MG/ML IJ SOLN
INTRAMUSCULAR | Status: AC
  Filled 2014-03-31: qty 2

## 2014-03-31 MED ORDER — ALTEPLASE 2 MG IJ SOLR
2.0000 mg | Freq: Once | INTRAMUSCULAR | Status: AC | PRN
  Filled 2014-03-31: qty 2

## 2014-03-31 MED ORDER — FENTANYL CITRATE 0.05 MG/ML IJ SOLN
50.0000 ug | INTRAMUSCULAR | Status: DC | PRN
  Administered 2014-03-31 – 2014-04-02 (×19): 50 ug via INTRAVENOUS
  Filled 2014-03-31 (×19): qty 2

## 2014-03-31 MED ORDER — HEPARIN SODIUM (PORCINE) 1000 UNIT/ML DIALYSIS
1000.0000 [IU] | INTRAMUSCULAR | Status: DC | PRN
  Filled 2014-03-31: qty 1

## 2014-03-31 MED ORDER — CLINIMIX/DEXTROSE (5/15) 5 % IV SOLN
INTRAVENOUS | Status: AC
  Administered 2014-03-30 – 2014-03-31 (×2): via INTRAVENOUS
  Filled 2014-03-31: qty 1000

## 2014-03-31 MED ORDER — NEPRO/CARBSTEADY PO LIQD
237.0000 mL | ORAL | Status: DC | PRN
  Administered 2014-03-31: 237 mL via ORAL
  Filled 2014-03-31: qty 237

## 2014-03-31 MED ORDER — PENTAFLUOROPROP-TETRAFLUOROETH EX AERO: 1.0000 "application " | INHALATION_SPRAY | CUTANEOUS | Status: DC | PRN

## 2014-03-31 MED ORDER — LIDOCAINE-PRILOCAINE 2.5-2.5 % EX CREA: 1.0000 "application " | TOPICAL_CREAM | CUTANEOUS | Status: DC | PRN

## 2014-03-31 MED ORDER — PIPERACILLIN-TAZOBACTAM IN DEX 2-0.25 GM/50ML IV SOLN
2.2500 g | Freq: Three times a day (TID) | INTRAVENOUS | Status: DC
  Administered 2014-03-31 – 2014-04-07 (×22): 2.25 g via INTRAVENOUS
  Filled 2014-03-31 (×26): qty 50

## 2014-03-31 MED ORDER — ENSURE PUDDING PO PUDG
1.0000 | Freq: Three times a day (TID) | ORAL | Status: DC
  Administered 2014-03-31 – 2014-04-07 (×16): 1 via ORAL

## 2014-03-31 MED ORDER — LIDOCAINE HCL (PF) 1 % IJ SOLN: 5.0000 mL | INTRAMUSCULAR | Status: DC | PRN

## 2014-03-31 NOTE — Progress Notes (Signed)
Difficult patient to manage.  Chronic abdominal pain.  Maria LamasJames O. Gae BonWyatt, III, MD, FACS [phone]--pager [phone]--office [facility]

## 2014-03-31 NOTE — Progress Notes (Signed)
SLP Cancellation Note  Patient Details Name: Jacalyn LefevreVera M Templer MRN: [id number] DOB: Sep 20, 1932   Cancelled treatment:        Pt. In HD when attempted to see for dysphagia intervention.  Will continue efforts.   [name], Gianni Fuchs Willis 03/31/2014, 2:11 PM

## 2014-03-31 NOTE — Progress Notes (Signed)
I saw the patient and agree with the above assessment and plan.    Pt refusing HD yesterday, agrees to tx today.  Though she has made remarkable progress, I am worried about if she can return to prev baseline.  I think ok to inc TPN rate.

## 2014-03-31 NOTE — Progress Notes (Signed)
[place] NOTE   Pharmacy Consult for TPN Indication: pneumoperitoneum with bowel resection  Allergies  Allergen Reactions  . Ivp Dye [Iodinated Diagnostic Agents] Swelling  . Lisinopril Swelling  . Iohexol Itching and Swelling        Patient Measurements: Height: 5\' 3"  (160 cm) Weight: 105 lb 2.6 oz (47.701 kg) IBW/kg (Calculated) : 52.4  Vital Signs: Temp: 97.9 F (36.6 C) (10/29 0935) Temp Source: Oral (10/29 0935) BP: 113/70 mmHg (10/29 0935) Pulse Rate: 80 (10/29 0935) Intake/Output from previous day: 10/28 0701 - 10/29 0700 In: 410 [IV Piggyback:50; TPN:360] Out: 1000 [Stool:1000] Intake/Output from this shift: Total I/O In: 240 [P.O.:240] Out: -   Labs:  Recent Labs  03/29/14 0530 03/29/14 1130 03/30/14 0420  WBC 7.9 8.6 9.8  HGB 9.9* 9.7* 9.4*  HCT 30.1* 30.0* 28.9*  PLT 62* 70* 86*     Recent Labs  03/29/14 1130 03/30/14 0420 03/31/14 0640  NA 140 140 138  K 3.7 3.6* 3.6*  CL 99 99 97  CO2 27 26 25   GLUCOSE 122* 105* 104*  BUN 33* 45* 62*  CREATININE 2.16* 2.86* 3.83*  CALCIUM 8.2* 8.3* 8.7  MG 1.7 1.8  --   PHOS 1.9* 2.3  --   PROT 5.2* 5.0*  --   ALBUMIN 1.9* 1.8*  --   AST 25 30  --   ALT 20 19  --   ALKPHOS 153* 168*  --   BILITOT 0.7 0.5  --    Estimated Creatinine Clearance: 9 ml/min (by C-G formula based on Cr of 3.83).    Recent Labs  03/29/14 1636 03/29/14 2351 03/30/14 0948  GLUCAP 124* 106* 100*   Insulin Requirements in the past 24 hours:  SSI dc'd 10/26  Current Nutrition:  Clinimix 5/15 at 80mL/hr (per renal request d/t pt inability to tolerate more volume) and 20% lipid emulsion at 27ml/hr only on Wed. This will provide average of 36 g protein and 579 kcal.  Nutritional Goals:  1500-1700 kCal, 75-85 grams of protein per day per RD recommendations 10/27  Admit: 46 YOF admitted 10/20 after transfer from [facility] with perforated bowel - CT showed free air and thickened small bowel. She was  taken to OR 10/20 PM for ex lap, application of open wound VAC and sigmoid colon resection.  GI: hx duodenal ulcers; POD#7 ex lap, sigmoid colon resection. Went back to OR 10/22 for re-exp, colostomy - also found to have several liver nodules suspicious for malignancy which were biopsied. Baseline prealbumin 14.1 (slightly low) and now decreased to 9. NGT now out as patient displaced during agitation while on HD. Pt c/o sore abdomen. Diet advanced to dysphagia diet however, no PO intake documented. Surgery recommended feeding tube for enteral nutrition. TPN initially dc'd yesterday however, MD wanting to continue x 1 more day to help improve eating. Endo: hx hypothyroidism, TSH 1.6 on admit, continued on home Synthroid . No noted history of DM, no A1C on file. CBGs low-nl since starting TPN; changed IV synthroid to PO Lytes: NO LYTES IN TPN - Na 138, K 3.6, Mag 1.8, Phos 2.3 Renal: ESRD on HD MWF with extra HD Sat 10/24 due to TPN. Renal started CRRT 10/21 PM, then stopped 10/23. Planning HD today. TPN at 30 ml/hr per renal request. Cards: CAD s/p CABG in 2008, HF with EF 25-30%, afib.  Hepatobil: LFTs remain WNL x alk phos elevated. Albumin low at 1.8. Baseline TG 199, now trig 97 - remains anemic  and thrombocytopenic, no overt bleeding noted ID: Continues on Zosyn D#9 for bowel perf. Blood and urine cultures with no growth to date, trach aspirate with few candida - Afebrile, WBC WNL Best Practices: SCDs for VTE proph, MC TPN Access: CVC double lumen left IJ placed 10/20 TPN day#: 8 (started 10/21)  Plan: - Continue Clinimix 5/15 (NO ELECTROLYTES) at 26mL/hr per renal MD request x 1 more day. Will only give IVFE once per week (on Wed). This will provide 36g protein daily and an average of 579 kcal/day- cannot concentrate TPN any further to provide more nutrients without providing excess fluid. - Maintenance fluids per CCM and Renal - F/u ability to DC TPN  [name], PharmD, BCPS Pager #  [phone] 03/31/2014 11:18 AM

## 2014-03-31 NOTE — Consult Note (Signed)
Patient [name]      DOB: April 21, 1933      YTK:[id number]     Consult Note from the Palliative Medicine Team at [place] Requested by: Dr. Tana Coast     PCP: Bonnita Nasuti, MD Reason for Consultation: [place]     Phone Number:[phone] Related symptm recommendations Assessment of patients Current state: 80 yr old white female known to our service from previous goals of care.  I had met with her two daughters and the patient during a previous hospital stay. [name] returns this admission with ischemic bowel and opted for a surgical intervention despite the rigor of the procedure and the poor prognosis given to the patient and family. [name] has always asserted full treatment status despite being intermittently noncompliant with treatment recommendations. Please see my note from the day of service. She is post op times two with a wound healing by secondary intention.  She is starting to get delirious. She has not be able to stand full dialysis sessions and she has significant pulmonary vascular congestion at this time. I spoke with her two daughters are adamant that she deserves aggressive treatment even if the outcome is death.  We all agreed that her symptom needs will be better addressed in stepdown environment as she is likely requiring more frequent meds than she is being given.  Girls confirm that [name] has agreed to a DNR status but they are not accepting comfort care at this time and want to address her symptom needs and continue dialysis.  I suspect that she may still be having low flow to her gut especially during dialysis which makes her want to stop treatment.     Goals of Care: 1.  Code Status: DNR   2. Scope of Treatment:  Continue curative treatments including dialysis. Aggressive pulmonary toilet.  Family understands that she may die, but need to let her "fight her fight".  4. Disposition: unknown but would not be surprised if she had a hospital death.   3. Symptom  Management:   1. Anxiety/Agitation: continue xananx 2. Pain: change fentanyl to q q1 hr prn  3. Bowel Regimen: Wound ostomy nurse consulting 4. Delirium: likely to continue to get worse. Could use haldol prn if needed 5. Terminal Secretions: NT suction, nebs, flutter valve. 6.   NPO s/p bowel resection: continue TPN. 4. Psychosocial:  Two daughters involved in her care, [name] is her POA  5. Spiritual: Family aware of [employer] availablity       Patient Documents Completed or Given: Document Given Completed  Advanced Directives Pkt    MOST    DNR    Gone from My Sight    Hard Choices      Brief HPI: 80 yr old admitted with abdomen pain , found to have a pneumoperitoneum. She is s/p colectomy for ischemic bowel with perforation. We were asked to assist with goals of care and symptom management.   ROS: severe abdomen pain, confusion, agitation    PMH:  Past Medical History  Diagnosis Date  . Carotid artery occlusion     Bilateral carotid artery disease  . Peripheral artery disease   . CHF (congestive heart failure)     EF 15-20% on TEE 12/15/13  . Hypertension   . Hyperlipidemia   . ESRD on hemodialysis 06/20/11    M, W, F; [place].  Started HD in Sept 2008  . AAA (abdominal aortic aneurysm)   . Stroke 2007  .  Duodenal ulcer 2008  . S/P CABG (coronary artery bypass graft)     CABG in 2008, had 3 MI's prior  . Depression   . Pneumonia   . COPD, severe   . Hypothyroidism   . Anemia   . Pacemaker   . Atrial fibrillation   . Complete heart block      PSH: Past Surgical History  Procedure Laterality Date  . Thyroid surgery  2004  . Dg av dialysis  shunt access exist*l* or  03/2000    left upper arm  . Vaginal hysterectomy  1976  . Hemorrhoid surgery  1971  . Insert / replace / remove pacemaker  1990's    initial placement  . Insert / replace / remove pacemaker  05/2011    MDT ADDRL1 pacemaker implanted by Dr Bettina Gavia  . Incisional hernia  repair  10/2010    incarcerated  . Common iliac  06/2009    bilateral kissing stents  . Pseudoaneurysm repair  09/1999    right groin  . Av fistula repair  04/2007    left upper arm  . Cardiac catheterization  02/10, 03/12    Most recent showed 3 vessel CAD with patent grafts: SVG to LAD, SVG to PDA and SVG to OM  . Coronary angioplasty with stent placement  09/1999    "1"  . Coronary artery bypass graft  02/2007    CABG X3  . Tee without cardioversion N/A 12/02/2013    Procedure: TRANSESOPHAGEAL ECHOCARDIOGRAM (TEE);  Surgeon: Lelon Perla, MD;  Location: [place];  Service: Cardiovascular;  Laterality: N/A;  . Laparotomy N/A 04/01/2014    Procedure: EXPLORATORY LAPAROTOMY;  Surgeon: Gayland Curry, MD;  Location: [place];  Service: General;  Laterality: N/A;  . Application of wound vac N/A 03/17/2014    Procedure: APPLICATION OF OPEN WOUND VAC;  Surgeon: Gayland Curry, MD;  Location: [place];  Service: General;  Laterality: N/A;  . Colostomy revision N/A 03/27/2014    Procedure: COLON RESECTION SIGMOID;  Surgeon: Gayland Curry, MD;  Location: [place];  Service: General;  Laterality: N/A;  . Laparotomy N/A 03/09/2014    Procedure: EXPLORATORY LAPAROTOMY;  Surgeon: Coralie Keens, MD;  Location: [place];  Service: General;  Laterality: N/A;  . Colostomy N/A 03/19/2014    Procedure: COLOSTOMY;  Surgeon: Coralie Keens, MD;  Location: [place];  Service: General;  Laterality: N/A;   I have reviewed the [place] and SH and  If appropriate update it with new information. Allergies  Allergen Reactions  . Ivp Dye [Iodinated Diagnostic Agents] Swelling  . Lisinopril Swelling  . Iohexol Itching and Swelling        Scheduled Meds: . antiseptic oral rinse  7 mL Mouth Rinse BID  . arformoterol  15 mcg Nebulization BID  . budesonide (PULMICORT) nebulizer solution  0.5 mg Nebulization BID  . feeding supplement (ENSURE)  1 Container Oral TID BM  . levothyroxine  150 mcg Oral QAC breakfast  . megestrol   400 mg Oral BID  . multivitamin with minerals  1 tablet Oral Daily  . olopatadine  1 drop Both Eyes BID  . ondansetron (ZOFRAN) IV  4 mg Intravenous 3 times per day  . pantoprazole  40 mg Oral Daily  . piperacillin-tazobactam (ZOSYN)  IV  2.25 g Intravenous Q8H  . tiotropium  18 mcg Inhalation Daily   Continuous Infusions: . sodium chloride 10 mL/hr at 03/28/14 1901  . TPN (CLINIMIX) Adult without lytes 30  mL/hr at 03/31/14 1716   PRN Meds:.sodium chloride, sodium chloride, sodium chloride, sodium chloride, albuterol, ALPRAZolam, alteplase, feeding supplement (NEPRO CARB STEADY), fentaNYL, heparin, lidocaine (PF), lidocaine-prilocaine, pentafluoroprop-tetrafluoroeth, pramoxine-mineral oil-zinc, RESOURCE THICKENUP CLEAR, sodium chloride    BP 128/55  Pulse 76  Temp(Src) 97.8 F (36.6 C) (Oral)  Resp 18  Ht $R'5\' 3"'YY$  (1.6 m)  Wt 47.701 kg (105 lb 2.6 oz)  BMI 18.63 kg/m2  SpO2 100%   PPS: 10%   Intake/Output Summary (Last 24 hours) at 03/31/14 1819 Last data filed at 03/31/14 1721  Gross per 24 hour  Intake    250 ml  Output    378 ml  Net   -128 ml     Physical Exam:  General: agitated not able to be reasoned with , calling out in pain HEENT:  PERRL, EOMI, anicteric, mmm  Chest:   Coarse bilateral wet rhonchi, no wheezing, difficulty completing sentances CVS: S1, S2 no murmur rub or gallop  Abdomen:dressings in place Ext: warm, trace edema Neuro:awake, alert oriented to person and family but not able to hear reason  Labs: CBC    Component Value Date/Time   WBC 9.8 03/30/2014 0420   RBC 2.89* 03/30/2014 0420   HGB 9.4* 03/30/2014 0420   HCT 28.9* 03/30/2014 0420   PLT 86* 03/30/2014 0420   MCV 100.0 03/30/2014 0420   MCH 32.5 03/30/2014 0420   MCHC 32.5 03/30/2014 0420   RDW 17.1* 03/30/2014 0420   LYMPHSABS 0.6* 03/30/2014 0420   MONOABS 0.6 03/30/2014 0420   EOSABS 0.2 03/30/2014 0420   BASOSABS 0.0 03/30/2014 0420      CMP     Component Value  Date/Time   NA 138 03/31/2014 0640   K 3.6* 03/31/2014 0640   CL 97 03/31/2014 0640   CO2 25 03/31/2014 0640   GLUCOSE 104* 03/31/2014 0640   BUN 62* 03/31/2014 0640   CREATININE 3.83* 03/31/2014 0640   CALCIUM 8.7 03/31/2014 0640   PROT 5.0* 03/30/2014 0420   ALBUMIN 1.8* 03/30/2014 0420   AST 30 03/30/2014 0420   ALT 19 03/30/2014 0420   ALKPHOS 168* 03/30/2014 0420   BILITOT 0.5 03/30/2014 0420   GFRNONAA 10* 03/31/2014 0640   GFRAA 12* 03/31/2014 0640    Chest Xray Reviewed/Impressions: Increasing bibasilar atelectasis.  Interval withdrawal of the left central venous line now lying over the aortic knob. Clinical correlation is recommended.    Time In Time Out Total Time Spent with Patient Total Overall Time  500 pm 552 pm 30 min 52 min    Greater than 50%  of this time was spent counseling and coordinating care related to the above assessment and plan.  Dynasty Holquin L. Lovena Le, MD MBA The Palliative Medicine Team at [facility] Phone: [phone] Pager: [phone] ( Use team phone after hours)

## 2014-03-31 NOTE — Progress Notes (Signed)
Report called to Marcelino DusterMichelle, RN on [facility] [place]. Dondra SpryMoore, Zair Borawski Islee, RN

## 2014-03-31 NOTE — Progress Notes (Addendum)
Patient ID: [name]  female  [id number]    DOB: Jun 04, 1932    DOA: 2014/04/11  PCP: Galvin Proffer, MD   Addendum:  Called by [name], patient and family wants full aggressive care. Patient complaining of pain, impeding her HD treatments. Will transfer to SDU.    [name] M.D. [facility] 03/31/2014, 6:07 PM  Pager: [phone]      Admit HPI / Brief Narrative:  80 y.o. female with history of CAD s/p CABG, systolic CHF, COPD, tobacco use, and ESRD who presented with AMS and dyspnea and was found to have a pneumoperitoneum. Difficult extubation was expected postoperatively and PCCM was consulted.  Significant Events:  10/20 Ex lap and removal of necrotic bowel; on neo  10/21 On levophed and neo  10/22 Back to OR, fascial closure. Finding of multiple liver nodules not previously noted. Liver biopsy obtained  10/22 Family conference. Pt made DNR in event of cardiac arrest.  10/23 Off vasopressors, extubated with adequate resp status post extubaiton  10/24 Stable for tx to SDU  10/25 [facility] assumed care  Since the hospitalist have assume the patient's care the primary focus has been advancement of her diet/nutritional status. Her respiratory status remains stable. Her peritonitis has essentially resolved. Her chronic health problems are currently well compensated. Patient was transferred to renal floor on 10/28.    Assessment/Plan: Principal Problem: Acute on chronic respiratory failure, post operative VDRF : Chest rattling today, patient has history of severe COPD, ongoing tobacco abuse, extubated on 10/23, also refused hemodialysis yesterday - Stat chest x-ray showed increasing bilateral atelectasis, placed on Zosyn for today but patient needs  - pt now weaned to RA and stable  - Cont nebulized steroids and BD's, ordered flutter valve and incentive spirometry, O2, deep suctioning   Active problems Severe sepsis and septic shock, secondary to peritonitis  -  Resolved, respiratory culture had shown few Candida albicans otherwise blood cultures, urine cultures all negative, patient had received Zosyn for 7 days, finished on 10/27. Due to her respiratory status today I started her on Zosyn, recheck after hemodialysis, may not need Zosyn.  Perforated sigmoid colon due to Intestinal ischemia  - Post laparotomy, sigmoid colectomy and liver biopsy, creation of colostomy with abdominal closure  - On TPN, patient needs to start eating slowly, on dysphagia 1 diet - Needs PT, OT, increase ambulation - Surgery following, Wet-dressing bid to lower midline incision  - We will continue TPN until patient establishes that she is tolerating diet and eating  Chronic systolic CHF, compensated  - HD for volume control   History of atrial fibrillation  - rate controlled, in NSR   History of complete heart block with pacemaker  CAD s/p 3v CABG  -No chest pain    ESRD (HD on MWF)  - per Nephrology  Liver nodules  - biopsy results indicate benign process  - colon pathology negative for malignancy   Anemia of CKD  - no signs of active bleeding  - follow CBC   Thrombocytopenia  - continue holding Heparin products - Plt slowly improving  - SCD's for DVT prophylaxis   Hypothyroidism  - Continue home Synthroid dose   Severe PCM  - secondary to acute illness with complications outlined above - patient states she is not interested in having an NG tube placed - I have explained her the extreme importance of adequate nutrition - I will initiate Megace   Acute functional quadriplegia  - secondary to acute  illness as noted above  - PT/OT evaluation once femoral dialysis catheter removed  DVT Prophylaxis: SCDs  Code Status: DO NOT RESUSCITATE  Family Communication:  Disposition: Patient may be a good candidate for LTAC, discussed with case management  Consultants:  Nephrology  Gen. Surgery  Ccm   Procedures: Ex lap and removal of necrotic  bowel   Antibiotics:  Iv zosyn    Subjective: Patient seen and examined, having difficulty breathing, chest rattling    Objective: Weight change: 0.001 kg (0.1 oz)  Intake/Output Summary (Last 24 hours) at 03/31/14 1052 Last data filed at 03/31/14 0845  Gross per 24 hour  Intake    600 ml  Output    850 ml  Net   -250 ml   Blood pressure 113/70, pulse 80, temperature 97.9 F (36.6 C), temperature source Oral, resp. rate 18, height 5\' 3"  (1.6 m), weight 47.701 kg (105 lb 2.6 oz), SpO2 100.00%.  Physical Exam: General: Alert and awake, oriented x3, not in any acute distress. CVS: S1-S2 clear, no murmur rubs or gallops Chest: diffuse rhonchi b/l  Abdomen: soft nontender, nondistended, normal bowel sounds  Extremities: no cyanosis, clubbing or edema noted bilaterally   Lab Results: Basic Metabolic Panel:  Recent Labs Lab 03/30/14 0420 03/31/14 0640  NA 140 138  K 3.6* 3.6*  CL 99 97  CO2 26 25  GLUCOSE 105* 104*  BUN 45* 62*  CREATININE 2.86* 3.83*  CALCIUM 8.3* 8.7  MG 1.8  --   PHOS 2.3  --    Liver Function Tests:  Recent Labs Lab 03/29/14 1130 03/30/14 0420  AST 25 30  ALT 20 19  ALKPHOS 153* 168*  BILITOT 0.7 0.5  PROT 5.2* 5.0*  ALBUMIN 1.9* 1.8*   No results found for this basename: LIPASE, AMYLASE,  in the last 168 hours No results found for this basename: AMMONIA,  in the last 168 hours CBC:  Recent Labs Lab 03/29/14 1130 03/30/14 0420  WBC 8.6 9.8  NEUTROABS 6.9 8.4*  HGB 9.7* 9.4*  HCT 30.0* 28.9*  MCV 102.7* 100.0  PLT 70* 86*   Cardiac Enzymes: No results found for this basename: CKTOTAL, CKMB, CKMBINDEX, TROPONINI,  in the last 168 hours BNP: No components found with this basename: POCBNP,  CBG:  Recent Labs Lab 03/28/14 0616 03/29/14 0213 03/29/14 1636 03/29/14 2351 03/30/14 0948  GLUCAP 96 103* 124* 106* 100*     Micro Results: Recent Results (from the past 240 hour(s))  MRSA PCR SCREENING     Status: None    Collection Time    03/23/14  2:34 AM      Result Value Ref Range Status   MRSA by PCR NEGATIVE  NEGATIVE Final   Comment:            The GeneXpert MRSA Assay (FDA     approved for NASAL specimens     only), is one component of a     comprehensive MRSA colonization     surveillance program. It is not     intended to diagnose MRSA     infection nor to guide or     monitor treatment for     MRSA infections.  CULTURE, BLOOD (ROUTINE X 2)     Status: None   Collection Time    03/23/14  3:23 AM      Result Value Ref Range Status   Specimen Description BLOOD LEFT HAND   Final   Special Requests BOTTLES DRAWN AEROBIC  ONLY 1CC   Final   Culture  Setup Time     Final   Value: 03/23/2014 09:07     Performed at [facility]   Culture     Final   Value: NO GROWTH 5 DAYS     Performed at [facility]   Report Status 03/29/2014 FINAL   Final  CULTURE, BLOOD (ROUTINE X 2)     Status: None   Collection Time    03/23/14  3:35 AM      Result Value Ref Range Status   Specimen Description BLOOD RIGHT HAND   Final   Special Requests BOTTLES DRAWN AEROBIC ONLY 1CC   Final   Culture  Setup Time     Final   Value: 03/23/2014 09:07     Performed at [facility]   Culture     Final   Value: NO GROWTH 5 DAYS     Performed at [facility]   Report Status 03/29/2014 FINAL   Final  URINE CULTURE     Status: None   Collection Time    03/23/14  3:49 AM      Result Value Ref Range Status   Specimen Description URINE, CATHETERIZED   Final   Special Requests ADDED [id number]   Final   Culture  Setup Time     Final   Value: 03/23/2014 08:44     Performed at [facility]   Colony Count     Final   Value: NO GROWTH     Performed at [facility]   Culture     Final   Value: NO GROWTH     Performed at [facility]   Report Status 03/03/2014 FINAL   Final  CULTURE, RESPIRATORY (NON-EXPECTORATED)     Status: None   Collection Time    03/23/14  10:15 AM      Result Value Ref Range Status   Specimen Description TRACHEAL ASPIRATE   Final   Special Requests NONE   Final   Gram Stain     Final   Value: RARE WBC PRESENT,BOTH PMN AND MONONUCLEAR     NO SQUAMOUS EPITHELIAL CELLS SEEN     RARE YEAST     Performed at [facility]   Culture     Final   Value: FEW CANDIDA ALBICANS     Performed at [facility]   Report Status 03/25/2014 FINAL   Final    Studies/Results: Dg Chest 2 View  03/15/2014   CLINICAL DATA:  COUGH ABDOMINAL PAIN cough.  Shortness of breath.  EXAM: CHEST  2 VIEW  COMPARISON:  CT abdomen 01/31/2014. Multiple chest radiographs dating back to 06/07/2013.  FINDINGS: Cardiomegaly. Median sternotomy/ CABG. Pulmonary vascular congestion is present. Diffuse interstitial prominence likely rib relates to chronic congestive heart failure. Bilateral pleural apical thickening. Thoracic compression fractures present. Coronary artery stents. Two lead RIGHT subclavian cardiac pacemaker leads are unchanged. Calcified av fistula is present in the LEFT arm.  IMPRESSION: Chronic cardiomegaly and pulmonary vascular congestion. No interval change or acute abnormality.   Electronically Signed   By: Andreas Newport M.D.   On: 03/15/2014 18:47   Dg Chest Port 1 View  03/23/2014   CLINICAL DATA:  Check central line placement  EXAM: PORTABLE CHEST - 1 VIEW  COMPARISON:  03/23/2014 1814 hrs  FINDINGS: Cardiac shadow is stable. Postsurgical changes are again seen. A pacing device is again noted and stable. The  endotracheal tube is noted 4.4 cm above the carina. A nasogastric catheter courses towards the stomach. The left central venous line has been advanced and now lies with the tip in the proximal superior vena cava. No pneumothorax is noted. Bibasilar infiltrates are again identified.  IMPRESSION: Tubes and lines as described.  Stable bibasilar changes.   Electronically Signed   By: Alcide Clever M.D.   On: 03/23/2014 20:25   Dg  Chest Port 1 View  03/23/2014   CLINICAL DATA:  Left central line placement.  EXAM: PORTABLE CHEST - 1 VIEW  COMPARISON:  03/23/2014 at 24:49 a.m.  FINDINGS: Endotracheal tube tip is indistinct but believed to be about 4.4 cm above the carina. Nasogastric tube enters the stomach. Left IJ line appears to of retracted slightly and now projects over the bottom of the aortic arch. This is probably in the innominate vein.  Prior CABG. Indistinct pulmonary vasculature. Stable low level airspace opacity in the right infrahilar region and retrocardiac region. coronary stent.  IMPRESSION: 1. Left IJ line has retracted somewhat, with tip projecting over the lower margin of the thoracic aorta, but likely in a venous structure given prior positioning. One could perform lateral view or chest CT to confirm line positioning. 2. Stable mild bibasilar airspace opacities. Interstitial accentuation in the upper lung zones may reflect mild pulmonary venous hypertension.   Electronically Signed   By: Herbie Baltimore M.D.   On: 03/23/2014 18:44   Dg Chest Port 1 View  03/23/2014   CLINICAL DATA:  Evaluate endotracheal tube placement  EXAM: PORTABLE CHEST - 1 VIEW  COMPARISON:  Chest x-ray of 03/15/2014  FINDINGS: The tip of the endotracheal tube is approximately 2.9 cm above the carina. Cardiomegaly and minimal pulmonary vascular congestion remains. Left central venous line is present with the tip seen to the junction of the left innominate vein with the SVC. Pacemaker remains.  IMPRESSION: 1. Endotracheal tube 2.9 cm above the carina. 2. Left IJ central venous line tip near the junction of the left innominate vein and SVC. 3. Stable cardiomegaly with minimal congestion endotracheal tube placement   Electronically Signed   By: Dwyane Dee M.D.   On: 03/23/2014 07:52   Dg Chest Port 1v Same Day  03/31/2014   CLINICAL DATA:  Dyspnea and aspiration pneumonia  EXAM: PORTABLE CHEST - 1 VIEW SAME DAY  COMPARISON:  03/26/2014   FINDINGS: Cardiac shadow is within normal limits. Postsurgical changes are again noted. A pacing device is stable. The left central venous line appears to have been withdrawn in the interval. The tip now lies over the aortic knob. Clinical correlation is recommended. The lungs are well aerated bilaterally. Increased left retrocardiac atelectasis and right basilar atelectasis is noted.  IMPRESSION: Increasing bibasilar atelectasis.  Interval withdrawal of the left central venous line now lying over the aortic knob. Clinical correlation is recommended.   Electronically Signed   By: Alcide Clever M.D.   On: 03/31/2014 08:02   Dg Chest Port 1v Same Day  03/26/2014   CLINICAL DATA:  Pulmonary edema, history of CHF, coronary artery disease post CABG, COPD, hypertension, end-stage renal disease on dialysis, atrial fibrillation  EXAM: PORTABLE CHEST - 1 VIEW SAME DAY  COMPARISON:  Portable exam 1213 hr compared to 03/23/2014  FINDINGS: Nasogastric tube extends into stomach.  LEFT jugular central venous catheter tip projects over SVC.  RIGHT subclavian sequential pacemaker leads project over RIGHT atrium and RIGHT ventricle.  Enlargement of cardiac silhouette post CABG.  Pulmonary vascular congestion.  Peribronchial thickening and slight chronic accentuation of interstitial markings likely reflect minimal failure.  No segmental infiltrate, pleural effusion or pneumothorax.  Bones demineralized.  IMPRESSION: Enlargement of cardiac silhouette post CABG and pacemaker.  Question minimal pulmonary edema.   Electronically Signed   By: Ulyses SouthwardMark  Boles M.D.   On: 03/26/2014 14:22   Dg Abd Portable 1v  03/26/2014   CLINICAL DATA:  NG tube placement.  EXAM: PORTABLE ABDOMEN - 1 VIEW  COMPARISON:  03/23/2014  FINDINGS: NG tube is seen, entering the left lung with the tip at the left lung base. Recommend complete removal and replacement.  Nonobstructive bowel gas pattern. Heavily calcified aorta. The right growing central line is in  place with the tip in the lower abdomen.  IMPRESSION: NG tube in the left lung with the tip in the left lung base.  Critical Value/emergent results were called by telephone at the time of interpretation on 03/26/2014 at 7:11 pm to patient's nurse Georgeann OppenheimAbla Asatsawo who verbally acknowledged these results.   Electronically Signed   By: Charlett NoseKevin  Dover M.D.   On: 03/26/2014 19:12   Dg Abd Portable 1v  03/23/2014   CLINICAL DATA:  Encounter for nasogastric tube placement  EXAM: PORTABLE ABDOMEN - 1 VIEW  COMPARISON:  CT abdomen pelvis of May 18, 2014  FINDINGS: The tip of the feeding tube is below the hemidiaphragm probably overlying the mid portion of the stomach. The bowel gas pattern is nonspecific. Considerable arterial calcification is present throughout the abdominal aorta and iliac arteries.  IMPRESSION: Tip of NG tube overlies the region of the body of the stomach.   Electronically Signed   By: Dwyane DeePaul  Barry M.D.   On: 03/23/2014 07:49    Medications: Scheduled Meds: . antiseptic oral rinse  7 mL Mouth Rinse BID  . arformoterol  15 mcg Nebulization BID  . budesonide (PULMICORT) nebulizer solution  0.5 mg Nebulization BID  . levothyroxine  150 mcg Oral QAC breakfast  . megestrol  400 mg Oral BID  . multivitamin with minerals  1 tablet Oral Daily  . olopatadine  1 drop Both Eyes BID  . ondansetron (ZOFRAN) IV  4 mg Intravenous 3 times per day  . pantoprazole  40 mg Oral Daily  . piperacillin-tazobactam (ZOSYN)  IV  2.25 g Intravenous Q8H  . tiotropium  18 mcg Inhalation Daily      LOS: 9 days   Cable Fearn M.D. [facility] 03/31/2014, 10:52 AM Pager: [phone]  If 7PM-7AM, please contact night-coverage [email or web address] Password [email or web address]

## 2014-03-31 NOTE — Progress Notes (Signed)
Pt transferred to 3S04. Family aware and at bedside. Dondra SpryMoore, Atiba Kimberlin Islee, RN

## 2014-03-31 NOTE — Progress Notes (Signed)
[facility] Progress Note  Assessment/Plan: 1. S/p partial colectomy/colostomy for ruptured sigmoid/ischemic bowel - on Vanc and Zosyn 2. ESRD - K 3.6 s/p 3 days CRRT then back on MWF HD schedule - refused since she got off CRRT, but agrees to go today 3. Anemia - Hgb trending down 9.4 - not on ESA prior to admission- resume 25 per week - check Fe studies 4. Secondary hyperparathyroidism -  No vit D Ca 8.7 P 2.3  5. HTN/volume - CXR - vol ok per cxr but sig above edw- goal 2 L on HD - keep systolic BP >100 systolic  - gentle volume removal; 1000 outpt via colostomy past 24 hr 6. Nutrition - low dose TPN 30 cc/hr -believe we can ^ to 50 an hr if she doesn't start eating 7. DM 8. COPD 9. Thrombocytopenia - on no heparin HD - plts improving 10. Severe deconditioning - may need LTC if she doesn't improve  Sheffield SliderMartha B Symphany Fleissner, PA-C [facility] Beeper [phone] 03/31/2014,9:39 AM  LOS: 9 days   Subjective:   Not eating - no appetite, weak, abdominal and bottom pain, agrees to HD today  Objective Filed Vitals:   03/30/14 1800 03/30/14 1902 03/30/14 2033 03/31/14 0432  BP: 110/48 104/53 114/42 113/48  Pulse: 75 77 76 79  Temp:  97.6 F (36.4 C) 98.7 F (37.1 C) 97.4 F (36.3 C)  TempSrc:  Oral Oral Oral  Resp: 19 18 18 18   Height:   5\' 3"  (1.6 m)   Weight:   47.701 kg (105 lb 2.6 oz)   SpO2: 100% 99% 100% 100%   Physical Exam General: frail, ill weak emaciated rattles with breathing Heart: RRR Lungs: coarse BS throughout Abdomen: colostomy green stool Extremities: no sig edema Dialysis Access: left upper AVF + bruit  Dialysis Orders: MWF [facility]  3.5h 44.5kg 2/2.25 [place] 350/800 Heparin none Profile 2  No meds  Additional Objective Labs: Basic Metabolic Panel:  Recent Labs Lab 03/28/14 0515 03/29/14 1130 03/30/14 0420 03/31/14 0640  NA 134* 140 140 138  K 3.6* 3.7 3.6* 3.6*  CL 95* 99 99 97  CO2 24 27 26 25   GLUCOSE 88 122* 105* 104*   BUN 58* 33* 45* 62*  CREATININE 2.86* 2.16* 2.86* 3.83*  CALCIUM 8.2* 8.2* 8.3* 8.7  PHOS 2.3 1.9* 2.3  --    Liver Function Tests:  Recent Labs Lab 03/28/14 0515 03/29/14 1130 03/30/14 0420  AST 30 25 30   ALT 20 20 19   ALKPHOS 125* 153* 168*  BILITOT 0.8 0.7 0.5  PROT 5.1* 5.2* 5.0*  ALBUMIN 2.0* 1.9* 1.8*   CBC:  Recent Labs Lab 03/25/14 0845 03/28/14 0515 03/29/14 0530 03/29/14 1130 03/30/14 0420  WBC 14.5* 7.3 7.9 8.6 9.8  NEUTROABS  --  5.6  --  6.9 8.4*  HGB 10.5* 9.5* 9.9* 9.7* 9.4*  HCT 33.1* 29.3* 30.1* 30.0* 28.9*  MCV 103.4* 98.3 98.7 102.7* 100.0  PLT 41* 54* 62* 70* 86*  CBG:  Recent Labs Lab 03/28/14 0616 03/29/14 0213 03/29/14 1636 03/29/14 2351 03/30/14 0948  GLUCAP 96 103* 124* 106* 100*  Studies/Results: Dg Chest Port 1v Same Day  03/31/2014   CLINICAL DATA:  Dyspnea and aspiration pneumonia  EXAM: PORTABLE CHEST - 1 VIEW SAME DAY  COMPARISON:  03/26/2014  FINDINGS: Cardiac shadow is within normal limits. Postsurgical changes are again noted. A pacing device is stable. The left central venous line appears to have been withdrawn in the interval. The  tip now lies over the aortic knob. Clinical correlation is recommended. The lungs are well aerated bilaterally. Increased left retrocardiac atelectasis and right basilar atelectasis is noted.  IMPRESSION: Increasing bibasilar atelectasis.  Interval withdrawal of the left central venous line now lying over the aortic knob. Clinical correlation is recommended.   Electronically Signed   By: Alcide CleverMark  Lukens M.D.   On: 03/31/2014 08:02   Medications: . sodium chloride 10 mL/hr at 03/28/14 1901  . TPN (CLINIMIX) Adult without lytes 30 mL/hr at 03/30/14 1748   . antiseptic oral rinse  7 mL Mouth Rinse BID  . arformoterol  15 mcg Nebulization BID  . budesonide (PULMICORT) nebulizer solution  0.5 mg Nebulization BID  . levothyroxine  150 mcg Oral QAC breakfast  . megestrol  400 mg Oral BID  . multivitamin  with minerals  1 tablet Oral Daily  . olopatadine  1 drop Both Eyes BID  . ondansetron (ZOFRAN) IV  4 mg Intravenous 3 times per day  . pantoprazole  40 mg Oral Daily  . piperacillin-tazobactam (ZOSYN)  IV  2.25 g Intravenous Q8H  . tiotropium  18 mcg Inhalation Daily

## 2014-03-31 NOTE — Progress Notes (Signed)
Seen on HD goal 2 L c/o pain BP 119/74.wants to come off HD - with 600 cc off. Explained she needed to continue HD or consider stopping.  She said she would could back tomorrow. Explained she needed to stay on today if just to clear her blood. She insists on coming off.  She is agreeable to Hospice and Palliative Care consult., which I think would be appropriate. Have asked Dr. Isidoro Donningai to arrange. Bard HerbertMarty Tarini Carrier, PA-C

## 2014-03-31 NOTE — Progress Notes (Signed)
Patient ID: Jacalyn LefevreVera M Anwar, female   DOB: 07-28-1932, 80 y.o.   MRN: [id number] 7 Days Post-Op  Subjective: Pt c/o abdominal pain.  Not eating much at all.  Objective: Vital signs in last 24 hours: Temp:  [97.4 F (36.3 C)-98.7 F (37.1 C)] 97.9 F (36.6 C) (10/29 0935) Pulse Rate:  [74-80] 80 (10/29 0935) Resp:  [15-25] 18 (10/29 0935) BP: (104-115)/(42-70) 113/70 mmHg (10/29 0935) SpO2:  [94 %-100 %] 100 % (10/29 0935) Weight:  [105 lb 2.6 oz (47.701 kg)] 105 lb 2.6 oz (47.701 kg) (10/28 2033) Last BM Date: 03/31/14  Intake/Output from previous day: 10/28 0701 - 10/29 0700 In: 410 [IV Piggyback:50; TPN:360] Out: 1000 [Stool:1000] Intake/Output this shift: Total I/O In: 240 [P.O.:240] Out: -   PE: Abd: soft, diffuse tenderness, +BS, ND, abdominal wound was not adequately packed and was opened back up some at the superior aspect of the wound.  Base of the wound as this part is dusky  Lab Results:   Recent Labs  03/29/14 1130 03/30/14 0420  WBC 8.6 9.8  HGB 9.7* 9.4*  HCT 30.0* 28.9*  PLT 70* 86*   BMET  Recent Labs  03/30/14 0420 03/31/14 0640  NA 140 138  K 3.6* 3.6*  CL 99 97  CO2 26 25  GLUCOSE 105* 104*  BUN 45* 62*  CREATININE 2.86* 3.83*  CALCIUM 8.3* 8.7   PT/INR No results found for this basename: LABPROT, INR,  in the last 72 hours CMP     Component Value Date/Time   NA 138 03/31/2014 0640   K 3.6* 03/31/2014 0640   CL 97 03/31/2014 0640   CO2 25 03/31/2014 0640   GLUCOSE 104* 03/31/2014 0640   BUN 62* 03/31/2014 0640   CREATININE 3.83* 03/31/2014 0640   CALCIUM 8.7 03/31/2014 0640   PROT 5.0* 03/30/2014 0420   ALBUMIN 1.8* 03/30/2014 0420   AST 30 03/30/2014 0420   ALT 19 03/30/2014 0420   ALKPHOS 168* 03/30/2014 0420   BILITOT 0.5 03/30/2014 0420   GFRNONAA 10* 03/31/2014 0640   GFRAA 12* 03/31/2014 0640   Lipase  No results found for this basename: lipase       Studies/Results: Dg Chest Port 1v Same Day  03/31/2014    CLINICAL DATA:  Dyspnea and aspiration pneumonia  EXAM: PORTABLE CHEST - 1 VIEW SAME DAY  COMPARISON:  03/26/2014  FINDINGS: Cardiac shadow is within normal limits. Postsurgical changes are again noted. A pacing device is stable. The left central venous line appears to have been withdrawn in the interval. The tip now lies over the aortic knob. Clinical correlation is recommended. The lungs are well aerated bilaterally. Increased left retrocardiac atelectasis and right basilar atelectasis is noted.  IMPRESSION: Increasing bibasilar atelectasis.  Interval withdrawal of the left central venous line now lying over the aortic knob. Clinical correlation is recommended.   Electronically Signed   By: Alcide CleverMark  Lukens M.D.   On: 03/31/2014 08:02    Anti-infectives: Anti-infectives   Start     Dose/Rate Route Frequency Ordered Stop   03/31/14 0800  piperacillin-tazobactam (ZOSYN) IVPB 2.25 g     2.25 g 100 mL/hr over 30 Minutes Intravenous Every 8 hours 03/31/14 0738     03/25/14 2000  piperacillin-tazobactam (ZOSYN) IVPB 2.25 g  Status:  Discontinued     2.25 g 100 mL/hr over 30 Minutes Intravenous 3 times per day 03/25/14 1339 03/30/14 1550   03/25/14 1345  piperacillin-tazobactam (ZOSYN) IVPB 2.25 g  Status:  Discontinued     2.25 g 100 mL/hr over 30 Minutes Intravenous 3 times per day 03/25/14 1335 03/25/14 1339   03/14/2014 1400  vancomycin (VANCOCIN) 500 mg in sodium chloride 0.9 % 100 mL IVPB  Status:  Discontinued     500 mg 100 mL/hr over 60 Minutes Intravenous Every 24 hours 03/23/14 1503 03/25/14 0856   03/23/14 2100  piperacillin-tazobactam (ZOSYN) IVPB 2.25 g  Status:  Discontinued     2.25 g 100 mL/hr over 30 Minutes Intravenous 4 times per day 03/23/14 1455 03/25/14 1335   03/23/14 0500  piperacillin-tazobactam (ZOSYN) IVPB 2.25 g  Status:  Discontinued     2.25 g 100 mL/hr over 30 Minutes Intravenous 3 times per day February 01, 2014 2242 03/23/14 1455   February 01, 2014 2245  [MAR Hold]  vancomycin  (VANCOCIN) IVPB 750 mg/150 ml premix     (On MAR Hold since 03/23/14 0020)   750 mg 150 mL/hr over 60 Minutes Intravenous  Once February 01, 2014 2241 03/23/14 0024       Assessment/Plan   s/p Procedure(s):  EXPLORATORY LAPAROTOMY  COLOSTOMY  POD7 -Patient not getting adequate nutrition and will not take orally. May need to pass feeding tube and put patient on tube feedings; however, when told, the patient does not want a PANDA placed. Parenteral nutrition is not adequate. -Consider Panda tube and enteral feedings if patient becomes agreeable. -WBC normal and there is no objective finding to suggest intra-abdominal process leading to her chronic abdominal pain. Will follow. -cont BID dressing changes to abdominal wound and routine ostomy care.   LOS: 9 days    Stesha Neyens E 03/31/2014, 11:41 AM Pager: [phone]

## 2014-03-31 NOTE — Progress Notes (Signed)
NUTRITION FOLLOW UP  Intervention:    TPN per pharmacy  Provide Ensure Pudding po TID, each supplement provides 170 kcal and 4 grams of protein.  Provide Magic cup TID with meals, each supplement provides 290 kcal and 9 grams of protein  Encourage PO intake RD to follow for nutrition care plan  Nutrition Dx:   Inadequate oral intake now related to poor appetite as evidenced by PO intake 0%, ongoing  Goal:   Pt to meet >/= 90% of their estimated nutrition needs, currently unmet  Monitor:   TPN prescription, PO intake, weight, labs, I/O's  Assessment:   80 y.o. female with history of CAD s/p CABG, systolic CHF, COPD, tobacco use, ESRD presenting with pneumoperitoneum.   Patient s/p procedures 10/22: EXPLORATORY LAPAROTOMY  COLOSTOMY  LIVER BIOPSY  TPN initiated 10/21.  Patient extubated 10/23.    S/p bedside swallow evaluation 10/25.  Advanced to Dys 1, pudding thick liquid diet 10/26.  PO intake 0% per flowsheet records.  Continues to receive HD.  10/27-Patient is receiving TPN via CVC with Clinimix E 5/15 @ 30 ml/hr (per Renal Service request).  Only receiving IVFE at 10 ml/hr once per week.  Provides 579 kcal and 36 grams protein per day.  Meets 38% minimum re-estimated energy needs and 48% minimum re-estimated protein needs.  10/29- Per pharmacy,Clinimix 5/15 NO LYTES at 5630mL/hr per renal MD request-- to run from 10/28 at 1800 to 10/29 at 1759.  -discontinue TPN at that point. Per MD note, pt does not wish to consider placement for an NG tube for feeding. Pt started on Megace. Pt reports no appetite. Meal completion is 10%. Pt is willing to try supplements. Will order Ensure pudding and Magic cup for between meals.   Labs: Low potassium and GFR. High BUN and creatinine.  Height: Ht Readings from Last 1 Encounters:  03/30/14 5\' 3"  (1.6 m)    Weight Status:   Wt Readings from Last 1 Encounters:  03/30/14 105 lb 2.6 oz (47.701 kg)    Body mass index is 18.63  kg/(m^2).   Re-estimated needs:  Kcal: 1500-1700 Protein: 75-85 gm Fluid: per MD  Skin: surgical incision to abdomen with open wound vac, Stage I pressure ulcer on sacrum, +1 RLE and +2 LLE edema  Diet Order: Dysphagia 1, pudding thick liquids   Intake/Output Summary (Last 24 hours) at 03/31/14 0926 Last data filed at 03/31/14 0300  Gross per 24 hour  Intake    360 ml  Output    850 ml  Net   -490 ml   Last BM: 10/28  Labs:   Recent Labs Lab 03/28/14 0515 03/29/14 1130 03/30/14 0420 03/31/14 0640  NA 134* 140 140 138  K 3.6* 3.7 3.6* 3.6*  CL 95* 99 99 97  CO2 24 27 26 25   BUN 58* 33* 45* 62*  CREATININE 2.86* 2.16* 2.86* 3.83*  CALCIUM 8.2* 8.2* 8.3* 8.7  MG 1.8 1.7 1.8  --   PHOS 2.3 1.9* 2.3  --   GLUCOSE 88 122* 105* 104*    CBG (last 3)   Recent Labs  03/29/14 1636 03/29/14 2351 03/30/14 0948  GLUCAP 124* 106* 100*    Scheduled Meds: . antiseptic oral rinse  7 mL Mouth Rinse BID  . arformoterol  15 mcg Nebulization BID  . budesonide (PULMICORT) nebulizer solution  0.5 mg Nebulization BID  . levothyroxine  150 mcg Oral QAC breakfast  . megestrol  400 mg Oral BID  . multivitamin with  minerals  1 tablet Oral Daily  . olopatadine  1 drop Both Eyes BID  . ondansetron (ZOFRAN) IV  4 mg Intravenous 3 times per day  . pantoprazole  40 mg Oral Daily  . piperacillin-tazobactam (ZOSYN)  IV  2.25 g Intravenous Q8H  . tiotropium  18 mcg Inhalation Daily    Continuous Infusions: . sodium chloride 10 mL/hr at 03/28/14 1901  . TPN (CLINIMIX) Adult without lytes 30 mL/hr at 03/30/14 1748    Marijean NiemannStephanie La, MS, RD, LDN Pager # [phone] After hours/ weekend pager # [phone]

## 2014-03-31 NOTE — Consult Note (Addendum)
Patient ON:GEXB:Dela Mannie StabileM Gruner      DOB: 09/21/1932      MWU:[id number]  Summary of Current Goals of care; full note to follow:  Patient is intermittently confused but still asserting her wishes and her family to some extent is honoring those wishes.  I don't believe she fully understands and is able to reason through all that is going on.  Her daughters feel that she is being pushed to make decisions against her wishes. When they ask her if she wants to stop dialysis like she told the team earlier, she tells them 'heck no'.  She just wanted to stop today because she was hurting. Patient continues to complain of abdomen pain and has significant congestion requiring NT suctioning which she tolerates. Neither patient or family are accepting or asserting a desire for comfort care or hospice.   They feel that more should be done to stabilize her and that she was moved out of step down too quickly.    Patient asking to get to the chair. Explained she has a dialysis catheter in her groin and that she can't do that till it is removed.  She is unable to process that .   At this time I think that until we can get her symptoms under better control with more frequent nonetheless short acting meds that it would be prudent to move her back to step down and work on aggressive pulmonary toilet and more frequent pain meds.  While this patient is a DNR the expectation is that she receive aggressive curative care and that she be able to dialyze. Perhaps short daily dialysis would be best for a little but.  I will speak with Nephrology. I have been frank with her daughters regarding her potential for death.     Recommend:  1. DNR but  NOT Comfort care  2.  Pain: change the interval for fentanyl to q 1 hour under close observation.  I would try to avoid morphine or dilaudid as she is not adequately dialyzing and I think it will make matters worse even life threatening.  3. Anxiety: continue her xanax  4.  Aggressive pulmonary  toilet.  Agree with flutter valve.   Spoke with my partner regarding her wishes as Dr. Greig RightLampkin will be seeing her tomorrow.  I reviewed the case with Dr. Isidoro Donningai, who concurs   Merline Perkin L. Ladona Ridgelaylor, MD MBA [facility] Team Phone: [phone] Pager: [phone] ( Use team phone after hours)

## 2014-03-31 NOTE — Progress Notes (Signed)
80yo female admitted 10/20 for pneumoperitoneum now w/ concern for aspiration PNA, to begin IV ABX.  Will start Zosyn 2.25g IV Q8H for ESRD and monitor heparin levels and CBC.  Vernard GamblesVeronda Dallis Darden, PharmD, BCPS 03/31/2014 7:40 AM

## 2014-04-01 ENCOUNTER — Inpatient Hospital Stay (HOSPITAL_COMMUNITY): Payer: Medicare Other

## 2014-04-01 LAB — BASIC METABOLIC PANEL
ANION GAP: 14 (ref 5–15)
BUN: 45 mg/dL — AB (ref 6–23)
CHLORIDE: 96 meq/L (ref 96–112)
CO2: 27 meq/L (ref 19–32)
CREATININE: 3.19 mg/dL — AB (ref 0.50–1.10)
Calcium: 8.8 mg/dL (ref 8.4–10.5)
GFR calc Af Amer: 15 mL/min — ABNORMAL LOW (ref >90)
GFR calc non Af Amer: 13 mL/min — ABNORMAL LOW (ref >90)
Glucose, Bld: 114 mg/dL — ABNORMAL HIGH (ref 70–99)
Potassium: 3.3 mEq/L — ABNORMAL LOW (ref 3.7–5.3)
Sodium: 137 mEq/L (ref 137–147)

## 2014-04-01 LAB — CBC
HEMATOCRIT: 28.2 % — AB (ref 36.0–46.0)
HEMOGLOBIN: 9.3 g/dL — AB (ref 12.0–15.0)
MCH: 33.2 pg (ref 26.0–34.0)
MCHC: 33 g/dL (ref 30.0–36.0)
MCV: 100.7 fL — AB (ref 78.0–100.0)
Platelets: 135 10*3/uL — ABNORMAL LOW (ref 150–400)
RBC: 2.8 MIL/uL — AB (ref 3.87–5.11)
RDW: 17.1 % — ABNORMAL HIGH (ref 11.5–15.5)
WBC: 12.8 10*3/uL — AB (ref 4.0–10.5)

## 2014-04-01 MED ORDER — FENTANYL CITRATE 0.05 MG/ML IJ SOLN
100.0000 ug | Freq: Once | INTRAMUSCULAR | Status: AC
  Administered 2014-04-01: 100 ug via INTRAVENOUS
  Filled 2014-04-01: qty 2

## 2014-04-01 MED ORDER — CLINIMIX/DEXTROSE (5/15) 5 % IV SOLN
INTRAVENOUS | Status: AC
  Administered 2014-04-01: 18:00:00 via INTRAVENOUS
  Filled 2014-04-01: qty 2000

## 2014-04-01 MED ORDER — POTASSIUM CHLORIDE CRYS ER 20 MEQ PO TBCR
40.0000 meq | EXTENDED_RELEASE_TABLET | Freq: Once | ORAL | Status: AC
  Administered 2014-04-01: 40 meq via ORAL
  Filled 2014-04-01: qty 2

## 2014-04-01 NOTE — Progress Notes (Signed)
ANTICOAGULATION CONSULT NOTE - Follow Up Consult  Pharmacy Consult for Zosyn Indication: Abdominal perforation, aspiration PNA  Allergies  Allergen Reactions  . Ivp Dye [Iodinated Diagnostic Agents] Swelling  . Lisinopril Swelling  . Iohexol Itching and Swelling         Patient Measurements: Height: 5\' 2"  (157.5 cm) Weight: 110 lb 0.2 oz (49.9 kg) IBW/kg (Calculated) : 50.1 Heparin Dosing Weight:   Vital Signs: Temp: 98.7 F (37.1 C) (10/30 0710) Temp Source: Oral (10/30 0710) BP: 122/51 mmHg (10/30 0710) Pulse Rate: 77 (10/30 0710)  Labs:  Recent Labs  03/30/14 0420 03/31/14 0640 04/01/14 0319  HGB 9.4*  --  9.3*  HCT 28.9*  --  28.2*  PLT 86*  --  135*  CREATININE 2.86* 3.83* 3.19*    Estimated Creatinine Clearance: 11.3 ml/min (by C-G formula based on Cr of 3.19).   Assessment: CC:80 y.o. female with ESRD, COPD, ICM EF 15-20%, Perforated bowel, [facility] (CHF exac, COPD exac).  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 Afeb, WBC12.8 up; ESRD on HD - s/p OR 10/21 for sigmoid colon resection with colectomy/colostomy, and liver biopsy.  10/20 vanc> 10/23 10/20 zosyn > 10/28, restarted 10/29   10/21 Urine>> neg FINAL 10/21 Bld x2>> neg FINAL 10/21 Trach aspirate >> few candida albicans MRSA PCR neg  Plan:  Zosyn 2.25g IV q8hr. Dose appropriate for ESRD Pharmacy will sign off of Zosyn protocol   Saxton Chain S. Merilynn Finlandobertson, PharmD, BCPS Clinical Staff Pharmacist Pager [phone]  Misty Stanleyobertson, Rondarius Kadrmas Stillinger 04/01/2014,2:01 PM

## 2014-04-01 NOTE — Progress Notes (Signed)
Speech Language Pathology Treatment: Dysphagia  Patient Details Name: [name] MRN: [id number] DOB: [birth year] Today's Date: 04/01/2014 Time: [id number] SLP Time Calculation (min): 12 min  Assessment / Plan / Recommendation Clinical Impression  Continued f/u for dysphagia.  Pt continues with significantly deconditioned swallow.  Abdominal pain today necessitating CT - pt willing to consume limited amounts of thickened contrast.  She will not permit HOB to be elevated due to pain, so consumption of thickened liquids in reclined position elicits congested cough.  Discussed with daughter that her mother is likely intermittently aspirating despite our best efforts to prevent it.  We discussed her deconditioned status, limited reserve, and sensitivity of swallowing system to overall medical condition.  Dtr verbalizes understanding.  Recommend continued conservative PO diet of dysphagia 1, pudding thick liquids; TPN currently primary source of nutrition.  Will follow for improvements.     HPI HPI: 80 y.o. female with history of CAD s/p CABG, systolic CHF, COPD, tobacco use, ESRD presenting with pneumoperitoneum.  Intubated 10/21-10/23;  10/20 s/p ex lap with colon resection and subsequent liver biopsy and creation of colostomy with abdominal closure. Acute renal failure on CKD , on CRT, continues to improve. Septic shock, improving.  Currently NPO with TPN.   Pertinent Vitals Pain Assessment: Faces Faces Pain Scale: Hurts whole lot Pain Location: abdomen Pain Intervention(s): Repositioned  SLP Plan  Continue with current plan of care    Recommendations Diet recommendations: Dysphagia 1 (puree);Pudding-thick liquid Liquids provided via: Teaspoon Medication Administration: Whole meds with puree Supervision: Patient able to self feed;Full supervision/cueing for compensatory strategies Compensations: Slow rate;Small sips/bites Postural Changes and/or Swallow Maneuvers:  (as upright as possible)             Oral Care Recommendations: Oral care BID Follow up Recommendations: Skilled Nursing facility Plan: Continue with current plan of care    [name], [place] CCC/SLP Pager [phone]      [name], [name] 04/01/2014, 2:32 PM

## 2014-04-01 NOTE — Progress Notes (Signed)
Patient ID: Maria LefevreVera M [name], female   DOB: Apr 17, 1933, 80 y.o.   MRN: [id number] 8 Days Post-Op  Subjective: Pt very sleepy today, just had pain meds.  Still won't eat.  Continues to complain of worsening abdominal pain.  Objective: Vital signs in last 24 hours: Temp:  [97.2 F (36.2 C)-98.7 F (37.1 C)] 98.7 F (37.1 C) (10/30 0710) Pulse Rate:  [74-82] 77 (10/30 0710) Resp:  [18-22] 22 (10/30 0710) BP: (93-128)/(44-64) 122/51 mmHg (10/30 0710) SpO2:  [94 %-100 %] 94 % (10/30 0844) Weight:  [110 lb 0.2 oz (49.9 kg)] 110 lb 0.2 oz (49.9 kg) (10/29 2100) Last BM Date: 03/31/14  Intake/Output from previous day: 10/29 0701 - 10/30 0700 In: 1430 [P.O.:270; I.V.:820; IV Piggyback:100; TPN:240] Out: 528 [Stool:350] Intake/Output this shift:    PE: Abd: soft, but very tender diffusely, +BS, ostomy is working well and stoma is pink and healthy.  Lab Results:   Recent Labs  03/30/14 0420 04/01/14 0319  WBC 9.8 12.8*  HGB 9.4* 9.3*  HCT 28.9* 28.2*  PLT 86* 135*   BMET  Recent Labs  03/31/14 0640 04/01/14 0319  NA 138 137  K 3.6* 3.3*  CL 97 96  CO2 25 27  GLUCOSE 104* 114*  BUN 62* 45*  CREATININE 3.83* 3.19*  CALCIUM 8.7 8.8   PT/INR No results found for this basename: LABPROT, INR,  in the last 72 hours CMP     Component Value Date/Time   NA 137 04/01/2014 0319   K 3.3* 04/01/2014 0319   CL 96 04/01/2014 0319   CO2 27 04/01/2014 0319   GLUCOSE 114* 04/01/2014 0319   BUN 45* 04/01/2014 0319   CREATININE 3.19* 04/01/2014 0319   CALCIUM 8.8 04/01/2014 0319   PROT 5.0* 03/30/2014 0420   ALBUMIN 1.8* 03/30/2014 0420   AST 30 03/30/2014 0420   ALT 19 03/30/2014 0420   ALKPHOS 168* 03/30/2014 0420   BILITOT 0.5 03/30/2014 0420   GFRNONAA 13* 04/01/2014 0319   GFRAA 15* 04/01/2014 0319   Lipase  No results found for this basename: lipase       Studies/Results: Dg Chest Port 1v Same Day  03/31/2014   CLINICAL DATA:  Dyspnea and aspiration pneumonia   EXAM: PORTABLE CHEST - 1 VIEW SAME DAY  COMPARISON:  03/26/2014  FINDINGS: Cardiac shadow is within normal limits. Postsurgical changes are again noted. A pacing device is stable. The left central venous line appears to have been withdrawn in the interval. The tip now lies over the aortic knob. Clinical correlation is recommended. The lungs are well aerated bilaterally. Increased left retrocardiac atelectasis and right basilar atelectasis is noted.  IMPRESSION: Increasing bibasilar atelectasis.  Interval withdrawal of the left central venous line now lying over the aortic knob. Clinical correlation is recommended.   Electronically Signed   By: Alcide CleverMark  Lukens M.D.   On: 03/31/2014 08:02    Anti-infectives: Anti-infectives   Start     Dose/Rate Route Frequency Ordered Stop   03/31/14 0800  piperacillin-tazobactam (ZOSYN) IVPB 2.25 g     2.25 g 100 mL/hr over 30 Minutes Intravenous Every 8 hours 03/31/14 0738     03/25/14 2000  piperacillin-tazobactam (ZOSYN) IVPB 2.25 g  Status:  Discontinued     2.25 g 100 mL/hr over 30 Minutes Intravenous 3 times per day 03/25/14 1339 03/30/14 1550   03/25/14 1345  piperacillin-tazobactam (ZOSYN) IVPB 2.25 g  Status:  Discontinued     2.25 g 100 mL/hr over 30  Minutes Intravenous 3 times per day 03/25/14 1335 03/25/14 1339   04/02/2014 1400  vancomycin (VANCOCIN) 500 mg in sodium chloride 0.9 % 100 mL IVPB  Status:  Discontinued     500 mg 100 mL/hr over 60 Minutes Intravenous Every 24 hours 03/23/14 1503 03/25/14 0856   03/23/14 2100  piperacillin-tazobactam (ZOSYN) IVPB 2.25 g  Status:  Discontinued     2.25 g 100 mL/hr over 30 Minutes Intravenous 4 times per day 03/23/14 1455 03/25/14 1335   03/23/14 0500  piperacillin-tazobactam (ZOSYN) IVPB 2.25 g  Status:  Discontinued     2.25 g 100 mL/hr over 30 Minutes Intravenous 3 times per day 03/12/2014 2242 03/23/14 1455   03/20/2014 2245  [MAR Hold]  vancomycin (VANCOCIN) IVPB 750 mg/150 ml premix     (On MAR Hold since  03/23/14 0020)   750 mg 150 mL/hr over 60 Minutes Intravenous  Once 03/21/2014 2241 03/23/14 0024       Assessment/Plan   s/p Procedure(s):  EXPLORATORY LAPAROTOMY  COLOSTOMY POD8 2. SPCM/TNA 3. ESRD 4. Multiple medical problems  Plan: 1. The patient continues to have abdominal pain.  Will order a CT scan of her abdomen today to rule out post operative infection.  The patient has significant vascular disease and very likely may have pain secondary to chronic mesenteric ischemia and low flow state on HD.  2. Her nutrition is almost of bigger concern.  If she does not start to eat or take in some nutrition, she will become failure to thrive and not do well.  TNA is not a viable long-term option in this situation. 3. Had a long discussion with the patient's family today about this. 4. Await CT scan results.   LOS: 10 days    [name] E 04/01/2014, 10:50 AM Pager: [phone]

## 2014-04-01 NOTE — Progress Notes (Signed)
Utilization review completed.  

## 2014-04-01 NOTE — Progress Notes (Signed)
Paged Dr. Harlon Florseui to inform of pt refusing CT scan. No new orders at this time.

## 2014-04-01 NOTE — Progress Notes (Signed)
[facility] Progress Note  Assessment/Plan: 1. S/p partial colectomy/colostomy for ruptured sigmoid/ischemic bowel - on Zosyn; WBC is trending up. AFebrile 2. ESRD - Outpt MWF.  As outpt she has frequent early termination of therapy.  Yesterday pt stopped tx early with pain as primary concern.  Seen by palliative care starting 10/29 and pt/family wish to cont on HD with better pain control.  Pt still wants treatment with curative intent.  Will cont HD but not today so that we can achieve improved pain status.  Used AVF yesterday, will remove Femoral HD catheter.  Next HD tomorrow.   3. Anemia - Hgb stable in 9s - not on ESA prior to admission- resumed 25 per week - check Fe studies 4. Secondary hyperparathyroidism -  No vit D Ca 8.7 P 2.3  5. HTN/volume - Attempt gentle UF tomorrow, 2L, outpt EDW not accurate with acute illness 6. Nutrition - low dose TPN 30 cc/hr -believe we can ^ to 50 an hr if she doesn't start eating 7. DM 8. COPD 9. Thrombocytopenia - on no heparin HD - plts improving 10. Severe deconditioning - may need LTC if she doesn't improve  Maria Heckyan Ginia Rudell MD 04/01/2014,9:11 AM  LOS: 10 days   Subjective:    Hemodialysis terminated early yesterday at patient request because of pain and cramping. She agreed to palliative care consult at that time. At that meeting she and her family expressed desire for treatment with curative intent. This included continuation of dialysis. She confirms this again today. She continues to complain of abdominal pain. Pain regimen is being addressed by palliative care.  Objective Filed Vitals:   04/01/14 0315 04/01/14 0335 04/01/14 0710 04/01/14 0844  BP: 109/49  122/51   Pulse: 76  77   Temp: 98 F (36.7 C)  98.7 F (37.1 C)   TempSrc: Oral  Oral   Resp: 19  22   Height:      Weight:      SpO2: 96% 98% 96% 94%   Physical Exam General: frail, ill weak emaciated rattles with breathing Heart: RRR Lungs: coarse BS  throughout Abdomen: colostomy green stool Extremities: no sig edema Dialysis Access: left upper AVF + bruit  Dialysis Orders: MWF [facility]  3.5h 44.5kg 2/2.25 Bath 350/800 Heparin none Profile 2  No meds  Additional Objective Labs: Basic Metabolic Panel:  Recent Labs Lab 03/28/14 0515 03/29/14 1130 03/30/14 0420 03/31/14 0640 04/01/14 0319  NA 134* 140 140 138 137  K 3.6* 3.7 3.6* 3.6* 3.3*  CL 95* 99 99 97 96  CO2 24 27 26 25 27   GLUCOSE 88 122* 105* 104* 114*  BUN 58* 33* 45* 62* 45*  CREATININE 2.86* 2.16* 2.86* 3.83* 3.19*  CALCIUM 8.2* 8.2* 8.3* 8.7 8.8  PHOS 2.3 1.9* 2.3  --   --    Liver Function Tests:  Recent Labs Lab 03/28/14 0515 03/29/14 1130 03/30/14 0420  AST 30 25 30   ALT 20 20 19   ALKPHOS 125* 153* 168*  BILITOT 0.8 0.7 0.5  PROT 5.1* 5.2* 5.0*  ALBUMIN 2.0* 1.9* 1.8*   CBC:  Recent Labs Lab 03/28/14 0515 03/29/14 0530 03/29/14 1130 03/30/14 0420 04/01/14 0319  WBC 7.3 7.9 8.6 9.8 12.8*  NEUTROABS 5.6  --  6.9 8.4*  --   HGB 9.5* 9.9* 9.7* 9.4* 9.3*  HCT 29.3* 30.1* 30.0* 28.9* 28.2*  MCV 98.3 98.7 102.7* 100.0 100.7*  PLT 54* 62* 70* 86* 135*  CBG:  Recent Labs Lab 03/28/14  [id number] 03/29/14 0213 03/29/14 1636 03/29/14 2351 03/30/14 0948  GLUCAP 96 103* 124* 106* 100*  Studies/Results: Dg Chest Port 1v Same Day  03/31/2014   CLINICAL DATA:  Dyspnea and aspiration pneumonia  EXAM: PORTABLE CHEST - 1 VIEW SAME DAY  COMPARISON:  03/26/2014  FINDINGS: Cardiac shadow is within normal limits. Postsurgical changes are again noted. A pacing device is stable. The left central venous line appears to have been withdrawn in the interval. The tip now lies over the aortic knob. Clinical correlation is recommended. The lungs are well aerated bilaterally. Increased left retrocardiac atelectasis and right basilar atelectasis is noted.  IMPRESSION: Increasing bibasilar atelectasis.  Interval withdrawal of the left central venous line now lying over the  aortic knob. Clinical correlation is recommended.   Electronically Signed   By: Alcide CleverMark  Lukens M.D.   On: 03/31/2014 08:02   Medications: . sodium chloride 10 mL/hr at 04/01/14 0700  . TPN (CLINIMIX) Adult without lytes 30 mL/hr at 04/01/14 0700   . antiseptic oral rinse  7 mL Mouth Rinse BID  . arformoterol  15 mcg Nebulization BID  . budesonide (PULMICORT) nebulizer solution  0.5 mg Nebulization BID  . feeding supplement (ENSURE)  1 Container Oral TID BM  . levothyroxine  150 mcg Oral QAC breakfast  . megestrol  400 mg Oral BID  . multivitamin with minerals  1 tablet Oral Daily  . olopatadine  1 drop Both Eyes BID  . ondansetron (ZOFRAN) IV  4 mg Intravenous 3 times per day  . pantoprazole  40 mg Oral Daily  . piperacillin-tazobactam (ZOSYN)  IV  2.25 g Intravenous Q8H  . tiotropium  18 mcg Inhalation Daily

## 2014-04-01 NOTE — Consult Note (Signed)
WOC ostomy follow up Stoma type/location:  LLQ, end colostomy Stomal assessment/size: 2 1/4" round, budded, edematous Peristomal assessment: intact  Treatment options for stomal/peristomal skin: NA Output liquid green effluent  Ostomy pouching: 1pc. Used today after discussion with patient's daughter and granddaughter at the bedside today they agree that a 1pc pouch may be easier for them since they will be the ones providing all the care for their mother at discharge along with PCS care work at home.   Education provided:  1pc ostomy pouch handout given to daughter.  [place] [facility] [place] Yes  WOC follow along with you and coordinate meetings with family to learn ostomy care as appropriate.   Jowell Bossi MaxwellAustin RN,CWOCN [phone]

## 2014-04-01 NOTE — Progress Notes (Signed)
[place] NOTE   Pharmacy Consult for TPN Indication: pneumoperitoneum with bowel resection  Allergies  Allergen Reactions  . Ivp Dye [Iodinated Diagnostic Agents] Swelling  . Lisinopril Swelling  . Iohexol Itching and Swelling        Patient Measurements: Height: 5\' 2"  (157.5 cm) Weight: 110 lb 0.2 oz (49.9 kg) IBW/kg (Calculated) : 50.1  Vital Signs: Temp: 98.7 F (37.1 C) (10/30 0710) Temp Source: Oral (10/30 0710) BP: 122/51 mmHg (10/30 0710) Pulse Rate: 77 (10/30 0710) Intake/Output from previous day: 10/29 0701 - 10/30 0700 In: 1430 [P.O.:270; I.V.:820; IV Piggyback:100; TPN:240] Out: 528 [Stool:350] Intake/Output from this shift:    Labs:  Recent Labs  03/29/14 1130 03/30/14 0420 04/01/14 0319  WBC 8.6 9.8 12.8*  HGB 9.7* 9.4* 9.3*  HCT 30.0* 28.9* 28.2*  PLT 70* 86* 135*     Recent Labs  03/29/14 1130 03/30/14 0420 03/31/14 0640 04/01/14 0319  NA 140 140 138 137  K 3.7 3.6* 3.6* 3.3*  CL 99 99 97 96  CO2 27 26 25 27   GLUCOSE 122* 105* 104* 114*  BUN 33* 45* 62* 45*  CREATININE 2.16* 2.86* 3.83* 3.19*  CALCIUM 8.2* 8.3* 8.7 8.8  MG 1.7 1.8  --   --   PHOS 1.9* 2.3  --   --   PROT 5.2* 5.0*  --   --   ALBUMIN 1.9* 1.8*  --   --   AST 25 30  --   --   ALT 20 19  --   --   ALKPHOS 153* 168*  --   --   BILITOT 0.7 0.5  --   --    Estimated Creatinine Clearance: 11.3 ml/min (by C-G formula based on Cr of 3.19).    Recent Labs  03/29/14 1636 03/29/14 2351 03/30/14 0948  GLUCAP 124* 106* 100*   Insulin Requirements in the past 24 hours:  SSI dc'd 10/26  Current Nutrition:  Clinimix 5/15 at 45mL/hr and 20% lipid emulsion at 28ml/hr only on Wed. This will provide average of 36 g protein and 579 kcal.  Nutritional Goals:  1500-1700 kCal, 75-85 grams of protein per day per RD recommendations 10/29  Admit: 10 YOF admitted 10/20 after transfer from [facility] with perforated bowel - CT showed free air and thickened  small bowel. She was taken to OR 10/20 PM for ex lap, application of open wound VAC and sigmoid colon resection.  GI: hx duodenal ulcers; POD#8 ex lap, sigmoid colon resection. Went back to OR 10/22 for re-exp, colostomy - also found to have several liver nodules suspicious for malignancy which were biopsied. Baseline prealbumin 14.1 (slightly low) and now decreased to 9. NGT now out as patient displaced during agitation while on HD. Pt c/o sore abdomen. Diet advanced to dysphagia diet however, no PO intake documented. Surgery recommended feeding tube for enteral nutrition. TPN initially dc'd yesterday however, MD wanting to continue x 1 more day to help improve eating. However, pt is still not eating well. Renal ok'd increasing TPN rate to max of 2ml/hr Endo: hx hypothyroidism, TSH 1.6 on admit, continued on home Synthroid . No noted history of DM, no A1C on file. CBGs low-nl since starting TPN; changed IV synthroid to PO Lytes: NO LYTES IN TPN - Na 137, K 3.3 (supplemented by MD), Mag 1.8, Phos 2.3 Renal: ESRD on HD MWF with extra HD Sat 10/24 due to TPN. Renal started CRRT 10/21 PM, then stopped 10/23. Planning HD tomorrow. Renal  ok'd increasing TPN rate Cards: CAD s/p CABG in 2008, HF with EF 25-30%, afib.  Hepatobil: LFTs remain WNL x alk phos elevated. Albumin low at 1.8. Baseline TG 199, now trig 97 - remains anemic and thrombocytopenic, no overt bleeding noted ID: Continues on Zosyn D#10 for bowel perf. Blood and urine cultures with no growth to date, trach aspirate with few candida - Afebrile, WBC WNL Best Practices: SCDs for VTE proph, MC TPN Access: CVC double lumen left IJ placed 10/20 TPN day#: 9 (started 10/21)  Plan: - Increase Clinimix 5/15 (NO ELECTROLYTES) to 33ml/hr (max per renal) with lipids only once weekly to maximize protein without overfeeding and without exceeding fluid limitations (unfortunately, we are unable to concentrate the bags since they are pre-mixed so we are unable  to get to nutrition goal with parenteral nutrition) - Maintenance fluids per MD - F/u long-term nutrition plan - F/u AM BMET  [name], PharmD, BCPS Pager # [phone] 04/01/2014 11:02 AM

## 2014-04-01 NOTE — Progress Notes (Signed)
PT Cancellation Note  Patient Details Name: Maria LefevreVera M Doubek MRN: [id number] DOB: December 12, 1932   Cancelled Treatment:    Reason Eval/Treat Not Completed: Medical issues which prohibited therapy. Pt with temporary femoral HD catheter. Will continue to hold PT until pt no longer has temporary femoral cath.     Trevel Dillenbeck 04/01/2014, 8:58 AM Cathlyn ParsonsElizabeth Mardel Grudzien, SPT

## 2014-04-01 NOTE — Progress Notes (Signed)
I saw the patient and agree with the above assessment and plan.    

## 2014-04-01 NOTE — Progress Notes (Signed)
Jema Deegan, PT DPT  [phone]  

## 2014-04-01 NOTE — Progress Notes (Signed)
Per MD order, femoral HD central line removed. IV cathter intact. Vaseline pressure gauze to site, pressure held a total of 30 min (C.Joyleen Haselton RN and Contractorosemary RN). Pt instructed not to get out of bed for 60 min after the removal of the femoral HD central line. Instucted to keep dressing CDI x 24hours, if bleeding occurs hold pressure and contact staff nurse. Daughter verbalized understanding and questions addressed. Consuello Masseimmons, Tramond Slinker M

## 2014-04-01 NOTE — Progress Notes (Signed)
Medicare Important Message given? YES  (If response is "NO", the following Medicare IM given date fields will be blank)  Date Medicare IM given: 04/01/14 Medicare IM given by:  Naftuli Dalsanto  

## 2014-04-01 NOTE — Progress Notes (Signed)
CT has not been done yet.  Maria LamasJames O. Gae BonWyatt, III, MD, FACS [phone]--pager [phone]--office [facility]

## 2014-04-01 NOTE — Progress Notes (Signed)
Patient ID: [name]  female  WUJ:[id number]    DOB: 1932-07-12    DOA: 03/12/2014  PCP: Galvin Proffer, MD   Admit HPI / Brief Narrative:  80 y.o. female with history of CAD s/p CABG, systolic CHF, COPD, tobacco use, and ESRD who presented with AMS and dyspnea and was found to have a pneumoperitoneum. Difficult extubation was expected postoperatively and PCCM was consulted.  Significant Events:  10/20 Ex lap and removal of necrotic bowel; on neo  10/21 On levophed and neo  10/22 Back to OR, fascial closure. Finding of multiple liver nodules not previously noted. Liver biopsy obtained  10/22 Family conference. Pt made DNR in event of cardiac arrest.  10/23 Off vasopressors, extubated with adequate resp status post extubaiton  10/24 Stable for tx to SDU  10/25 [facility] assumed care  Since the hospitalist have assume the patient's care the primary focus has been advancement of her diet/nutritional status. Her respiratory status remains stable. Her peritonitis has essentially resolved. Her chronic health problems are currently well compensated. Patient was transferred to renal floor on 10/28 but moved back to stepdown unit on 10/29.    Assessment/Plan: Principal Problem: Acute on chronic respiratory failure, post operative VDRF : Still coarse breath sounds, patient has history of severe COPD, ongoing tobacco abuse, extubated on 10/23, not completing hemodialysis - chest x-ray 10/29 showed increasing bilateral atelectasis, placed on Zosyn,  - on 3 L O2 via nasal cannula was placed on Ventimask last night - Cont nebulized steroids and BD's, ordered flutter valve and incentive spirometry, O2, deep suctioning  - Needs hemodialysis for volume management, however patient has not been completing or refusing (see nephrology notes)  Active problems Severe sepsis and septic shock, secondary to peritonitis  -  improved, respiratory culture had shown few Candida albicans otherwise blood cultures, urine  cultures all negative, patient had received Zosyn for 7 days, finished on 10/27. Due to her respiratory status,  I restarted her on Zosyn  Perforated sigmoid colon due to Intestinal ischemia  - Post laparotomy, sigmoid colectomy and liver biopsy, creation of colostomy with abdominal closure  - On TPN, patient needs to start eating slowly, on dysphagia 1 diet however she is not eating - Needs PT, OT, increase ambulation - Surgery following closely, Wet-dressing bid to lower midline incision  - We will continue TPN until patient establishes that she is tolerating diet and eating - Placed on fentanyl for pain control, palliative medicine following closely  ESRD (HD on MWF)  - per Nephrology, discussed with Dr. Marisue Humble today, difficult situation. As outpatient also, she has frequently terminated her hemodialysis early. Patient had refused hemodialysis on 10/28, went for hemodialysis on 10/29 but stopped early due to pain and cramping. She is volume overloaded. Palliative medicine consult was called, 10/29, per goals of care, patient and family requested to continue with hemodialysis and better pain control. Patient was placed on fentanyl IV q1hrs prn and patient was transferred to stepdown unit. Patient's family requested aggressive care. - Per nephrology, we will focus on pain control today and attempt hemodialysis tomorrow,will remove her femoral hemodialysis catheter so patient can sit up and participate in PT  Chronic systolic CHF, ESRD - HD for volume control   History of atrial fibrillation  - rate controlled, in NSR   History of complete heart block with pacemaker  CAD s/p 3v CABG  -No chest pain    Liver nodules  - biopsy results indicate benign process  -  colon pathology negative for malignancy   Anemia of CKD  - no signs of active bleeding  - follow CBC   Thrombocytopenia  - continue holding Heparin products - Plt slowly improving  - SCD's for DVT prophylaxis    Hypothyroidism  - Continue home Synthroid dose   Severe PCM  - secondary to acute illness with complications outlined above - patient states she is not interested in having an NG tube placed  - Currently on TPN  Acute functional quadriplegia  - secondary to acute illness as noted above  - PT/OT evaluation once femoral dialysis catheter removed  Hypokalemia-replaced  DVT Prophylaxis: SCDs  Code Status: DO NOT RESUSCITATE  Family Communication:  Disposition:Not medically stable yet  Consultants:  Nephrology  Gen. Surgery  Ccm   Procedures: Ex lap and removal of necrotic bowel   Antibiotics:  Iv zosyn    Subjective: Patient seen and examined this morning, pain somewhat controlled however still short of breath and coarse breath sounds throughout   Objective: Weight change: 2.199 kg (4 lb 13.6 oz)  Intake/Output Summary (Last 24 hours) at 04/01/14 1105 Last data filed at 04/01/14 0600  Gross per 24 hour  Intake   1190 ml  Output    528 ml  Net    662 ml   Blood pressure 122/51, pulse 77, temperature 98.7 F (37.1 C), temperature source Oral, resp. rate 22, height 5\' 2"  (1.575 m), weight 49.9 kg (110 lb 0.2 oz), SpO2 94.00%.  Physical Exam: General: Alert and awake, oriented, not in any acute distress. CVS: S1-S2 clear, no murmur rubs or gallops Chest: diffuse rhonchi  bilaterally  Abdomen: tender, midline incision, dressing intact  Extremities: no cyanosis, clubbing or edema noted bilaterally   Lab Results: Basic Metabolic Panel:  Recent Labs Lab 03/30/14 0420 03/31/14 0640 04/01/14 0319  NA 140 138 137  K 3.6* 3.6* 3.3*  CL 99 97 96  CO2 26 25 27   GLUCOSE 105* 104* 114*  BUN 45* 62* 45*  CREATININE 2.86* 3.83* 3.19*  CALCIUM 8.3* 8.7 8.8  MG 1.8  --   --   PHOS 2.3  --   --    Liver Function Tests:  Recent Labs Lab 03/29/14 1130 03/30/14 0420  AST 25 30  ALT 20 19  ALKPHOS 153* 168*  BILITOT 0.7 0.5  PROT 5.2* 5.0*  ALBUMIN  1.9* 1.8*   No results found for this basename: LIPASE, AMYLASE,  in the last 168 hours No results found for this basename: AMMONIA,  in the last 168 hours CBC:  Recent Labs Lab 03/30/14 0420 04/01/14 0319  WBC 9.8 12.8*  NEUTROABS 8.4*  --   HGB 9.4* 9.3*  HCT 28.9* 28.2*  MCV 100.0 100.7*  PLT 86* 135*   Cardiac Enzymes: No results found for this basename: CKTOTAL, CKMB, CKMBINDEX, TROPONINI,  in the last 168 hours BNP: No components found with this basename: POCBNP,  CBG:  Recent Labs Lab 03/28/14 0616 03/29/14 0213 03/29/14 1636 03/29/14 2351 03/30/14 0948  GLUCAP 96 103* 124* 106* 100*     Micro Results: Recent Results (from the past 240 hour(s))  MRSA PCR SCREENING     Status: None   Collection Time    03/23/14  2:34 AM      Result Value Ref Range Status   MRSA by PCR NEGATIVE  NEGATIVE Final   Comment:            The GeneXpert MRSA Assay (FDA  approved for NASAL specimens     only), is one component of a     comprehensive MRSA colonization     surveillance program. It is not     intended to diagnose MRSA     infection nor to guide or     monitor treatment for     MRSA infections.  CULTURE, BLOOD (ROUTINE X 2)     Status: None   Collection Time    03/23/14  3:23 AM      Result Value Ref Range Status   Specimen Description BLOOD LEFT HAND   Final   Special Requests BOTTLES DRAWN AEROBIC ONLY 1CC   Final   Culture  Setup Time     Final   Value: 03/23/2014 09:07     Performed at [facility]   Culture     Final   Value: NO GROWTH 5 DAYS     Performed at [facility]   Report Status 03/29/2014 FINAL   Final  CULTURE, BLOOD (ROUTINE X 2)     Status: None   Collection Time    03/23/14  3:35 AM      Result Value Ref Range Status   Specimen Description BLOOD RIGHT HAND   Final   Special Requests BOTTLES DRAWN AEROBIC ONLY 1CC   Final   Culture  Setup Time     Final   Value: 03/23/2014 09:07     Performed at [facility]     Culture     Final   Value: NO GROWTH 5 DAYS     Performed at [facility]   Report Status 03/29/2014 FINAL   Final  URINE CULTURE     Status: None   Collection Time    03/23/14  3:49 AM      Result Value Ref Range Status   Specimen Description URINE, CATHETERIZED   Final   Special Requests ADDED [id number]   Final   Culture  Setup Time     Final   Value: 03/23/2014 08:44     Performed at [facility]   Colony Count     Final   Value: NO GROWTH     Performed at [facility]   Culture     Final   Value: NO GROWTH     Performed at [facility]   Report Status 06-14-13 FINAL   Final  CULTURE, RESPIRATORY (NON-EXPECTORATED)     Status: None   Collection Time    03/23/14 10:15 AM      Result Value Ref Range Status   Specimen Description TRACHEAL ASPIRATE   Final   Special Requests NONE   Final   Gram Stain     Final   Value: RARE WBC PRESENT,BOTH PMN AND MONONUCLEAR     NO SQUAMOUS EPITHELIAL CELLS SEEN     RARE YEAST     Performed at [facility]   Culture     Final   Value: FEW CANDIDA ALBICANS     Performed at [facility]   Report Status 03/25/2014 FINAL   Final    Studies/Results: Dg Chest 2 View  03/15/2014   CLINICAL DATA:  COUGH ABDOMINAL PAIN cough.  Shortness of breath.  EXAM: CHEST  2 VIEW  COMPARISON:  CT abdomen 01/31/2014. Multiple chest radiographs dating back to 06/07/2013.  FINDINGS: Cardiomegaly. Median sternotomy/ CABG. Pulmonary vascular congestion is present. Diffuse interstitial prominence likely rib relates to chronic congestive heart  failure. Bilateral pleural apical thickening. Thoracic compression fractures present. Coronary artery stents. Two lead RIGHT subclavian cardiac pacemaker leads are unchanged. Calcified av fistula is present in the LEFT arm.  IMPRESSION: Chronic cardiomegaly and pulmonary vascular congestion. No interval change or acute abnormality.   Electronically Signed   By: Andreas Newport  M.D.   On: 03/15/2014 18:47   Dg Chest Port 1 View  03/23/2014   CLINICAL DATA:  Check central line placement  EXAM: PORTABLE CHEST - 1 VIEW  COMPARISON:  03/23/2014 1814 hrs  FINDINGS: Cardiac shadow is stable. Postsurgical changes are again seen. A pacing device is again noted and stable. The endotracheal tube is noted 4.4 cm above the carina. A nasogastric catheter courses towards the stomach. The left central venous line has been advanced and now lies with the tip in the proximal superior vena cava. No pneumothorax is noted. Bibasilar infiltrates are again identified.  IMPRESSION: Tubes and lines as described.  Stable bibasilar changes.   Electronically Signed   By: Alcide Clever M.D.   On: 03/23/2014 20:25   Dg Chest Port 1 View  03/23/2014   CLINICAL DATA:  Left central line placement.  EXAM: PORTABLE CHEST - 1 VIEW  COMPARISON:  03/23/2014 at 24:49 a.m.  FINDINGS: Endotracheal tube tip is indistinct but believed to be about 4.4 cm above the carina. Nasogastric tube enters the stomach. Left IJ line appears to of retracted slightly and now projects over the bottom of the aortic arch. This is probably in the innominate vein.  Prior CABG. Indistinct pulmonary vasculature. Stable low level airspace opacity in the right infrahilar region and retrocardiac region. coronary stent.  IMPRESSION: 1. Left IJ line has retracted somewhat, with tip projecting over the lower margin of the thoracic aorta, but likely in a venous structure given prior positioning. One could perform lateral view or chest CT to confirm line positioning. 2. Stable mild bibasilar airspace opacities. Interstitial accentuation in the upper lung zones may reflect mild pulmonary venous hypertension.   Electronically Signed   By: Herbie Baltimore M.D.   On: 03/23/2014 18:44   Dg Chest Port 1 View  03/23/2014   CLINICAL DATA:  Evaluate endotracheal tube placement  EXAM: PORTABLE CHEST - 1 VIEW  COMPARISON:  Chest x-ray of 03/15/2014  FINDINGS:  The tip of the endotracheal tube is approximately 2.9 cm above the carina. Cardiomegaly and minimal pulmonary vascular congestion remains. Left central venous line is present with the tip seen to the junction of the left innominate vein with the SVC. Pacemaker remains.  IMPRESSION: 1. Endotracheal tube 2.9 cm above the carina. 2. Left IJ central venous line tip near the junction of the left innominate vein and SVC. 3. Stable cardiomegaly with minimal congestion endotracheal tube placement   Electronically Signed   By: Dwyane Dee M.D.   On: 03/23/2014 07:52   Dg Chest Port 1v Same Day  03/31/2014   CLINICAL DATA:  Dyspnea and aspiration pneumonia  EXAM: PORTABLE CHEST - 1 VIEW SAME DAY  COMPARISON:  03/26/2014  FINDINGS: Cardiac shadow is within normal limits. Postsurgical changes are again noted. A pacing device is stable. The left central venous line appears to have been withdrawn in the interval. The tip now lies over the aortic knob. Clinical correlation is recommended. The lungs are well aerated bilaterally. Increased left retrocardiac atelectasis and right basilar atelectasis is noted.  IMPRESSION: Increasing bibasilar atelectasis.  Interval withdrawal of the left central venous line now lying over the aortic  knob. Clinical correlation is recommended.   Electronically Signed   By: Alcide CleverMark  Lukens M.D.   On: 03/31/2014 08:02   Dg Chest Port 1v Same Day  03/26/2014   CLINICAL DATA:  Pulmonary edema, history of CHF, coronary artery disease post CABG, COPD, hypertension, end-stage renal disease on dialysis, atrial fibrillation  EXAM: PORTABLE CHEST - 1 VIEW SAME DAY  COMPARISON:  Portable exam 1213 hr compared to 03/23/2014  FINDINGS: Nasogastric tube extends into stomach.  LEFT jugular central venous catheter tip projects over SVC.  RIGHT subclavian sequential pacemaker leads project over RIGHT atrium and RIGHT ventricle.  Enlargement of cardiac silhouette post CABG.  Pulmonary vascular congestion.   Peribronchial thickening and slight chronic accentuation of interstitial markings likely reflect minimal failure.  No segmental infiltrate, pleural effusion or pneumothorax.  Bones demineralized.  IMPRESSION: Enlargement of cardiac silhouette post CABG and pacemaker.  Question minimal pulmonary edema.   Electronically Signed   By: Ulyses SouthwardMark  Boles M.D.   On: 03/26/2014 14:22   Dg Abd Portable 1v  03/26/2014   CLINICAL DATA:  NG tube placement.  EXAM: PORTABLE ABDOMEN - 1 VIEW  COMPARISON:  03/23/2014  FINDINGS: NG tube is seen, entering the left lung with the tip at the left lung base. Recommend complete removal and replacement.  Nonobstructive bowel gas pattern. Heavily calcified aorta. The right growing central line is in place with the tip in the lower abdomen.  IMPRESSION: NG tube in the left lung with the tip in the left lung base.  Critical Value/emergent results were called by telephone at the time of interpretation on 03/26/2014 at 7:11 pm to patient's nurse Georgeann OppenheimAbla Asatsawo who verbally acknowledged these results.   Electronically Signed   By: Charlett NoseKevin  Dover M.D.   On: 03/26/2014 19:12   Dg Abd Portable 1v  03/23/2014   CLINICAL DATA:  Encounter for nasogastric tube placement  EXAM: PORTABLE ABDOMEN - 1 VIEW  COMPARISON:  CT abdomen pelvis of 03/14/2014  FINDINGS: The tip of the feeding tube is below the hemidiaphragm probably overlying the mid portion of the stomach. The bowel gas pattern is nonspecific. Considerable arterial calcification is present throughout the abdominal aorta and iliac arteries.  IMPRESSION: Tip of NG tube overlies the region of the body of the stomach.   Electronically Signed   By: Dwyane DeePaul  Barry M.D.   On: 03/23/2014 07:49    Medications: Scheduled Meds: . antiseptic oral rinse  7 mL Mouth Rinse BID  . arformoterol  15 mcg Nebulization BID  . budesonide (PULMICORT) nebulizer solution  0.5 mg Nebulization BID  . feeding supplement (ENSURE)  1 Container Oral TID BM  .  levothyroxine  150 mcg Oral QAC breakfast  . megestrol  400 mg Oral BID  . multivitamin with minerals  1 tablet Oral Daily  . olopatadine  1 drop Both Eyes BID  . ondansetron (ZOFRAN) IV  4 mg Intravenous 3 times per day  . pantoprazole  40 mg Oral Daily  . piperacillin-tazobactam (ZOSYN)  IV  2.25 g Intravenous Q8H  . tiotropium  18 mcg Inhalation Daily      LOS: 10 days   RAI,RIPUDEEP M.D. [facility] 04/01/2014, 11:05 AM Pager: [phone]  If 7PM-7AM, please contact night-coverage [email or web address] Password [email or web address]

## 2014-04-01 NOTE — Progress Notes (Signed)
Pt asleep and mouth breathing placed 35% Venn mask placed will continue to monitor saturations improved from 83% to 100 percent will continue to monitor.

## 2014-04-01 NOTE — Progress Notes (Signed)
Patient WU:JWJX:[name]      DOB: January 30, 1933      BJY:[id number]  See consultation from Dr Ladona Ridgelaylor. Family reportedly was to be here at 9 to learn dressing change.  I do not see any family today.  Her pain medicine use is unchanged.  Dr Ladona Ridgelaylor had extensive conversation as documented.  I will attempt to follow-up over weekend if service demands allow.  If not will plan on follow-up Monday. Feel free to call if more urgent needs arise.   [name] D.O. Palliative Medicine Team at [facility]  Pager: [phone] Team Phone: [phone]

## 2014-04-02 DIAGNOSIS — Z515 Encounter for palliative care: Secondary | ICD-10-CM

## 2014-04-02 DIAGNOSIS — R630 Anorexia: Secondary | ICD-10-CM

## 2014-04-02 DIAGNOSIS — F418 Other specified anxiety disorders: Secondary | ICD-10-CM

## 2014-04-02 LAB — BASIC METABOLIC PANEL
Anion gap: 16 — ABNORMAL HIGH (ref 5–15)
BUN: 62 mg/dL — ABNORMAL HIGH (ref 6–23)
CHLORIDE: 97 meq/L (ref 96–112)
CO2: 23 mEq/L (ref 19–32)
Calcium: 9.4 mg/dL (ref 8.4–10.5)
Creatinine, Ser: 4.13 mg/dL — ABNORMAL HIGH (ref 0.50–1.10)
GFR calc Af Amer: 11 mL/min — ABNORMAL LOW (ref >90)
GFR, EST NON AFRICAN AMERICAN: 9 mL/min — AB (ref >90)
GLUCOSE: 101 mg/dL — AB (ref 70–99)
POTASSIUM: 4.6 meq/L (ref 3.7–5.3)
SODIUM: 136 meq/L — AB (ref 137–147)

## 2014-04-02 MED ORDER — MIRTAZAPINE 7.5 MG PO TABS
7.5000 mg | ORAL_TABLET | Freq: Every day | ORAL | Status: DC
  Administered 2014-04-02 – 2014-04-06 (×5): 7.5 mg via ORAL
  Filled 2014-04-02 (×6): qty 1

## 2014-04-02 MED ORDER — PANTOPRAZOLE SODIUM 40 MG IV SOLR
40.0000 mg | INTRAVENOUS | Status: DC
  Administered 2014-04-02 – 2014-04-07 (×6): 40 mg via INTRAVENOUS
  Filled 2014-04-02 (×8): qty 40

## 2014-04-02 MED ORDER — CLINIMIX/DEXTROSE (5/15) 5 % IV SOLN
INTRAVENOUS | Status: AC
  Administered 2014-04-02: 18:00:00 via INTRAVENOUS
  Filled 2014-04-02: qty 2000

## 2014-04-02 MED ORDER — FENTANYL CITRATE 0.05 MG/ML IJ SOLN
25.0000 ug | INTRAMUSCULAR | Status: DC | PRN
  Administered 2014-04-02 – 2014-04-05 (×18): 25 ug via INTRAVENOUS
  Filled 2014-04-02 (×18): qty 2

## 2014-04-02 MED ORDER — FENTANYL 12 MCG/HR TD PT72
12.5000 ug | MEDICATED_PATCH | TRANSDERMAL | Status: DC
  Administered 2014-04-02 – 2014-04-05 (×2): 12.5 ug via TRANSDERMAL
  Filled 2014-04-02 (×2): qty 1

## 2014-04-02 NOTE — Progress Notes (Signed)
[facility] Progress Note 9 Days Post-Op  Subjective: Patient refused CT yesterday after receiving some of her oral contrast and going to the scanner.  Objective: Vital signs in last 24 hours: Temp:  [97.5 F (36.4 C)-99 F (37.2 C)] 98.4 F (36.9 C) (10/31 0745) Pulse Rate:  [73-77] 74 (10/31 0915) Resp:  [12-77] 15 (10/31 0915) BP: (87-126)/(39-58) 87/39 mmHg (10/31 0915) SpO2:  [90 %-100 %] 95 % (10/31 0915) Weight:  [47.4 kg (104 lb 8 oz)-51.2 kg (112 lb 14 oz)] 47.4 kg (104 lb 8 oz) (10/31 0745) Last BM Date: 04/01/14  Intake/Output from previous day: 10/30 0701 - 10/31 0700 In: 679.5 [I.V.:179.5; IV Piggyback:150; TPN:350] Out: 225 [Stool:225] Intake/Output this shift:    General: Still complaining of abdominal pain, constantly  Lungs: Coarse breath sounds bilaterally.  Abd: Stoma is functioning and is viable.  Excellent bowel sounds.  Extremities: No changes  Neuro: Intact  Lab Results:  @LABLAST2 (wbc:2,hgb:2,hct:2,plt:2) BMET  Recent Labs  04/01/14 0319 04/02/14 0415  NA 137 136*  K 3.3* 4.6  CL 96 97  CO2 27 23  GLUCOSE 114* 101*  BUN 45* 62*  CREATININE 3.19* 4.13*  CALCIUM 8.8 9.4   PT/INR No results found for this basename: LABPROT, INR,  in the last 72 hours ABG No results found for this basename: PHART, PCO2, PO2, HCO3,  in the last 72 hours  Studies/Results: No results found.  Anti-infectives: Anti-infectives   Start     Dose/Rate Route Frequency Ordered Stop   03/31/14 0800  piperacillin-tazobactam (ZOSYN) IVPB 2.25 g     2.25 g 100 mL/hr over 30 Minutes Intravenous Every 8 hours 03/31/14 0738     03/25/14 2000  piperacillin-tazobactam (ZOSYN) IVPB 2.25 g  Status:  Discontinued     2.25 g 100 mL/hr over 30 Minutes Intravenous 3 times per day 03/25/14 1339 03/30/14 1550   03/25/14 1345  piperacillin-tazobactam (ZOSYN) IVPB 2.25 g  Status:  Discontinued     2.25 g 100 mL/hr over 30 Minutes Intravenous 3 times per day 03/25/14 1335  03/25/14 1339   03/07/2014 1400  vancomycin (VANCOCIN) 500 mg in sodium chloride 0.9 % 100 mL IVPB  Status:  Discontinued     500 mg 100 mL/hr over 60 Minutes Intravenous Every 24 hours 03/23/14 1503 03/25/14 0856   03/23/14 2100  piperacillin-tazobactam (ZOSYN) IVPB 2.25 g  Status:  Discontinued     2.25 g 100 mL/hr over 30 Minutes Intravenous 4 times per day 03/23/14 1455 03/25/14 1335   03/23/14 0500  piperacillin-tazobactam (ZOSYN) IVPB 2.25 g  Status:  Discontinued     2.25 g 100 mL/hr over 30 Minutes Intravenous 3 times per day 03/11/2014 2242 03/23/14 1455   03/18/2014 2245  [MAR Hold]  vancomycin (VANCOCIN) IVPB 750 mg/150 ml premix     (On MAR Hold since 03/23/14 0020)   750 mg 150 mL/hr over 60 Minutes Intravenous  Once 03/28/2014 2241 03/23/14 0024      Assessment/Plan: s/p Procedure(s): EXPLORATORY LAPAROTOMY COLOSTOMY Not able to objectively assess her abdominal examination. She refuses CT. CPM  LOS: 11 days   Marta LamasJames O. Gae BonWyatt, III, MD, FACS [phone]--pager [phone]--office [facility] 04/02/2014

## 2014-04-02 NOTE — Progress Notes (Signed)
Patient ID: [name]  female  QMV:[id number]    DOB: [birth year]    DOA: 03/05/2014  PCP: Galvin ProfferHAGUE, IMRAN P, MD   Admit HPI / Brief Narrative:  80 y.o. female with history of CAD s/p CABG, systolic CHF, COPD, tobacco use, and ESRD who presented with AMS and dyspnea and was found to have a pneumoperitoneum. Difficult extubation was expected postoperatively and PCCM was consulted.  Significant Events:  10/20 Ex lap and removal of necrotic bowel; on neo  10/21 On levophed and neo  10/22 Back to OR, fascial closure. Finding of multiple liver nodules not previously noted. Liver biopsy obtained  10/22 Family conference. Pt made DNR in event of cardiac arrest.  10/23 Off vasopressors, extubated with adequate resp status post extubaiton  10/24 Stable for tx to SDU  10/25 [facility] assumed care  Since the hospitalist have assume the patient's care the primary focus has been advancement of her diet/nutritional status. Her respiratory status remains stable. Her peritonitis has essentially resolved. Her chronic health problems are currently well compensated. Patient was transferred to renal floor on 10/28 but moved back to stepdown unit on 10/29.    Assessment/Plan:  Acute on chronic respiratory failure, post operative VDRF : Still coarse breath sounds, patient has history of severe COPD, ongoing tobacco abuse, extubated on 10/23, not completing hemodialysis - chest x-ray 10/29 showed increasing bilateral atelectasis, placed on Zosyn,  -[place] - Cont nebulized steroids and BD's, ordered flutter valve and incentive spirometry, O2, deep suctioning  - Needs hemodialysis for volume management, however patient has not been completing or refusing (see nephrology notes)   Severe sepsis and septic shock, secondary to peritonitis  -  improved, respiratory culture had shown few Candida albicans otherwise blood cultures, urine cultures all negative, patient had received Zosyn for 7 days, finished on 10/27.  -zosyn  restarted  Perforated sigmoid colon due to Intestinal ischemia  - Post laparotomy, sigmoid colectomy and liver biopsy, creation of colostomy with abdominal closure  - On TPN, patient needs to start eating slowly, on dysphagia 1 diet however she is not eating - Needs PT, OT, increase ambulation - Surgery following closely, Wet-dressing bid to lower midline incision  - We will continue TPN until patient establishes that she is tolerating diet and eating - Placed on fentanyl for pain control, palliative medicine following closely  ESRD (HD on MWF)  - per Nephrology, discussed with Dr. Marisue HumbleSanford today, difficult situation. As outpatient also, she has frequently terminated her hemodialysis early. Patient had refused hemodialysis on 10/28, went for hemodialysis on 10/29 but stopped early due to pain and cramping. She is volume overloaded. Palliative medicine consult was called, 10/29, per goals of care, patient and family requested to continue with hemodialysis and better pain control. Patient was placed on fentanyl IV q1hrs prn and patient was transferred to stepdown unit. Patient's family requested aggressive care. - Per nephrology, we will focus on pain control today and attempt hemodialysis tomorrow,will remove her femoral hemodialysis catheter so patient can sit up and participate in PT  Chronic systolic CHF, ESRD - HD for volume control   History of atrial fibrillation  - rate controlled, in NSR   History of complete heart block with pacemaker  CAD s/p 3v CABG  -No chest pain    Liver nodules  - biopsy results indicate benign process  - colon pathology negative for malignancy   Anemia of CKD  - no signs of active bleeding  - follow CBC  Thrombocytopenia  - continue holding Heparin products - Plt slowly improving  - SCD's for DVT prophylaxis   Hypothyroidism  - Continue home Synthroid dose   Severe PCM  - secondary to acute illness with complications outlined above - patient  states she is not interested in having an NG tube placed  -not eating well -TPN  Acute functional quadriplegia  - secondary to acute illness as noted above  - PT/OT evaluation pending  Hypokalemia-replaced  DVT Prophylaxis: SCDs  Code Status: DO NOT RESUSCITATE  Family Communication:  Disposition:Not medically stable yet/LTAC???  Consultants:  Nephrology  Gen. Surgery  Ccm   Palliative care  Procedures: Ex lap and removal of necrotic bowel   Antibiotics:  zosyn   Subjective: Not eating well Some confusion last PM  Objective: Weight change: 1.3 kg (2 lb 13.9 oz)  Intake/Output Summary (Last 24 hours) at 04/02/14 0737 Last data filed at 04/02/14 0700  Gross per 24 hour  Intake  679.5 ml  Output    225 ml  Net  454.5 ml   Blood pressure 126/51, pulse 75, temperature 99 F (37.2 C), temperature source Oral, resp. rate 22, height 5\' 2"  (1.575 m), weight 51.2 kg (112 lb 14 oz), SpO2 95.00%.  Physical Exam: General: Alert and awake, oriented, not in any acute distress. CVS: S1-S2 clear, no murmur rubs or gallops Chest: diffuse rhonchi  bilaterally  Abdomen: tender, midline incision, dressing intact  Extremities: no cyanosis, clubbing or edema noted bilaterally   Lab Results: Basic Metabolic Panel:  Recent Labs Lab 03/30/14 0420  04/01/14 0319 04/02/14 0415  NA 140  < > 137 136*  K 3.6*  < > 3.3* 4.6  CL 99  < > 96 97  CO2 26  < > 27 23  GLUCOSE 105*  < > 114* 101*  BUN 45*  < > 45* 62*  CREATININE 2.86*  < > 3.19* 4.13*  CALCIUM 8.3*  < > 8.8 9.4  MG 1.8  --   --   --   PHOS 2.3  --   --   --   < > = values in this interval not displayed. Liver Function Tests:  Recent Labs Lab 03/29/14 1130 03/30/14 0420  AST 25 30  ALT 20 19  ALKPHOS 153* 168*  BILITOT 0.7 0.5  PROT 5.2* 5.0*  ALBUMIN 1.9* 1.8*   No results found for this basename: LIPASE, AMYLASE,  in the last 168 hours No results found for this basename: AMMONIA,  in the last  168 hours CBC:  Recent Labs Lab 03/30/14 0420 04/01/14 0319  WBC 9.8 12.8*  NEUTROABS 8.4*  --   HGB 9.4* 9.3*  HCT 28.9* 28.2*  MCV 100.0 100.7*  PLT 86* 135*   Cardiac Enzymes: No results found for this basename: CKTOTAL, CKMB, CKMBINDEX, TROPONINI,  in the last 168 hours BNP: No components found with this basename: POCBNP,  CBG:  Recent Labs Lab 03/28/14 0616 03/29/14 0213 03/29/14 1636 03/29/14 2351 03/30/14 0948  GLUCAP 96 103* 124* 106* 100*     Micro Results: Recent Results (from the past 240 hour(s))  CULTURE, RESPIRATORY (NON-EXPECTORATED)     Status: None   Collection Time    03/23/14 10:15 AM      Result Value Ref Range Status   Specimen Description TRACHEAL ASPIRATE   Final   Special Requests NONE   Final   Gram Stain     Final   Value: RARE WBC PRESENT,BOTH PMN AND MONONUCLEAR  NO SQUAMOUS EPITHELIAL CELLS SEEN     RARE YEAST     Performed at [facility]   Culture     Final   Value: FEW CANDIDA ALBICANS     Performed at [facility]   Report Status 03/25/2014 FINAL   Final    Studies/Results: Dg Chest 2 View  03/15/2014   CLINICAL DATA:  COUGH ABDOMINAL PAIN cough.  Shortness of breath.  EXAM: CHEST  2 VIEW  COMPARISON:  CT abdomen 01/31/2014. Multiple chest radiographs dating back to 06/07/2013.  FINDINGS: Cardiomegaly. Median sternotomy/ CABG. Pulmonary vascular congestion is present. Diffuse interstitial prominence likely rib relates to chronic congestive heart failure. Bilateral pleural apical thickening. Thoracic compression fractures present. Coronary artery stents. Two lead RIGHT subclavian cardiac pacemaker leads are unchanged. Calcified av fistula is present in the LEFT arm.  IMPRESSION: Chronic cardiomegaly and pulmonary vascular congestion. No interval change or acute abnormality.   Electronically Signed   By: Andreas Newport M.D.   On: 03/15/2014 18:47   Dg Chest Port 1 View  03/23/2014   CLINICAL DATA:  Check  central line placement  EXAM: PORTABLE CHEST - 1 VIEW  COMPARISON:  03/23/2014 1814 hrs  FINDINGS: Cardiac shadow is stable. Postsurgical changes are again seen. A pacing device is again noted and stable. The endotracheal tube is noted 4.4 cm above the carina. A nasogastric catheter courses towards the stomach. The left central venous line has been advanced and now lies with the tip in the proximal superior vena cava. No pneumothorax is noted. Bibasilar infiltrates are again identified.  IMPRESSION: Tubes and lines as described.  Stable bibasilar changes.   Electronically Signed   By: Alcide Clever M.D.   On: 03/23/2014 20:25   Dg Chest Port 1 View  03/23/2014   CLINICAL DATA:  Left central line placement.  EXAM: PORTABLE CHEST - 1 VIEW  COMPARISON:  03/23/2014 at 24:49 a.m.  FINDINGS: Endotracheal tube tip is indistinct but believed to be about 4.4 cm above the carina. Nasogastric tube enters the stomach. Left IJ line appears to of retracted slightly and now projects over the bottom of the aortic arch. This is probably in the innominate vein.  Prior CABG. Indistinct pulmonary vasculature. Stable low level airspace opacity in the right infrahilar region and retrocardiac region. coronary stent.  IMPRESSION: 1. Left IJ line has retracted somewhat, with tip projecting over the lower margin of the thoracic aorta, but likely in a venous structure given prior positioning. One could perform lateral view or chest CT to confirm line positioning. 2. Stable mild bibasilar airspace opacities. Interstitial accentuation in the upper lung zones may reflect mild pulmonary venous hypertension.   Electronically Signed   By: Herbie Baltimore M.D.   On: 03/23/2014 18:44   Dg Chest Port 1 View  03/23/2014   CLINICAL DATA:  Evaluate endotracheal tube placement  EXAM: PORTABLE CHEST - 1 VIEW  COMPARISON:  Chest x-ray of 03/15/2014  FINDINGS: The tip of the endotracheal tube is approximately 2.9 cm above the carina. Cardiomegaly and  minimal pulmonary vascular congestion remains. Left central venous line is present with the tip seen to the junction of the left innominate vein with the SVC. Pacemaker remains.  IMPRESSION: 1. Endotracheal tube 2.9 cm above the carina. 2. Left IJ central venous line tip near the junction of the left innominate vein and SVC. 3. Stable cardiomegaly with minimal congestion endotracheal tube placement   Electronically Signed   By: Lucienne Minks.D.  On: 03/23/2014 07:52   Dg Chest Port 1v Same Day  03/31/2014   CLINICAL DATA:  Dyspnea and aspiration pneumonia  EXAM: PORTABLE CHEST - 1 VIEW SAME DAY  COMPARISON:  03/26/2014  FINDINGS: Cardiac shadow is within normal limits. Postsurgical changes are again noted. A pacing device is stable. The left central venous line appears to have been withdrawn in the interval. The tip now lies over the aortic knob. Clinical correlation is recommended. The lungs are well aerated bilaterally. Increased left retrocardiac atelectasis and right basilar atelectasis is noted.  IMPRESSION: Increasing bibasilar atelectasis.  Interval withdrawal of the left central venous line now lying over the aortic knob. Clinical correlation is recommended.   Electronically Signed   By: Alcide Clever M.D.   On: 03/31/2014 08:02   Dg Chest Port 1v Same Day  03/26/2014   CLINICAL DATA:  Pulmonary edema, history of CHF, coronary artery disease post CABG, COPD, hypertension, end-stage renal disease on dialysis, atrial fibrillation  EXAM: PORTABLE CHEST - 1 VIEW SAME DAY  COMPARISON:  Portable exam 1213 hr compared to 03/23/2014  FINDINGS: Nasogastric tube extends into stomach.  LEFT jugular central venous catheter tip projects over SVC.  RIGHT subclavian sequential pacemaker leads project over RIGHT atrium and RIGHT ventricle.  Enlargement of cardiac silhouette post CABG.  Pulmonary vascular congestion.  Peribronchial thickening and slight chronic accentuation of interstitial markings likely reflect  minimal failure.  No segmental infiltrate, pleural effusion or pneumothorax.  Bones demineralized.  IMPRESSION: Enlargement of cardiac silhouette post CABG and pacemaker.  Question minimal pulmonary edema.   Electronically Signed   By: Ulyses Southward M.D.   On: 03/26/2014 14:22   Dg Abd Portable 1v  03/26/2014   CLINICAL DATA:  NG tube placement.  EXAM: PORTABLE ABDOMEN - 1 VIEW  COMPARISON:  03/23/2014  FINDINGS: NG tube is seen, entering the left lung with the tip at the left lung base. Recommend complete removal and replacement.  Nonobstructive bowel gas pattern. Heavily calcified aorta. The right growing central line is in place with the tip in the lower abdomen.  IMPRESSION: NG tube in the left lung with the tip in the left lung base.  Critical Value/emergent results were called by telephone at the time of interpretation on 03/26/2014 at 7:11 pm to patient's nurse Georgeann Oppenheim who verbally acknowledged these results.   Electronically Signed   By: Charlett Nose M.D.   On: 03/26/2014 19:12   Dg Abd Portable 1v  03/23/2014   CLINICAL DATA:  Encounter for nasogastric tube placement  EXAM: PORTABLE ABDOMEN - 1 VIEW  COMPARISON:  CT abdomen pelvis of 03/19/2014  FINDINGS: The tip of the feeding tube is below the hemidiaphragm probably overlying the mid portion of the stomach. The bowel gas pattern is nonspecific. Considerable arterial calcification is present throughout the abdominal aorta and iliac arteries.  IMPRESSION: Tip of NG tube overlies the region of the body of the stomach.   Electronically Signed   By: Dwyane Dee M.D.   On: 03/23/2014 07:49    Medications: Scheduled Meds: . antiseptic oral rinse  7 mL Mouth Rinse BID  . arformoterol  15 mcg Nebulization BID  . budesonide (PULMICORT) nebulizer solution  0.5 mg Nebulization BID  . feeding supplement (ENSURE)  1 Container Oral TID BM  . levothyroxine  150 mcg Oral QAC breakfast  . megestrol  400 mg Oral BID  . multivitamin with minerals  1  tablet Oral Daily  . olopatadine  1 drop  Both Eyes BID  . ondansetron (ZOFRAN) IV  4 mg Intravenous 3 times per day  . pantoprazole  40 mg Oral Daily  . piperacillin-tazobactam (ZOSYN)  IV  2.25 g Intravenous Q8H  . tiotropium  18 mcg Inhalation Daily      LOS: 11 days   Kevis Qu  DO [facility] 04/02/2014, 7:37 AM Pager: [phone]  If 7PM-7AM, please contact night-coverage [email or web address] Password [email or web address]

## 2014-04-02 NOTE — Procedures (Addendum)
I was present at this dialysis session. I have reviewed the session itself and made appropriate changes.   Done as a travel case. Pt having trouble tolerating even in bed, restless, agitated.    Patient demanded to have treatment terminated with more than 1 hour remaining. This was a 3 hour treatment which is an abbreviation of her outpatient regimen. We tried to improve her pain control with narcotics during the treatment and she still wished to discontinue. Need to continue to work with the patient and in the family on this disconnect for curative goals but inability to tolerate the treatments to achieve these goals.  Sabra Heckyan Sanford  MD 04/02/2014, 9:02 AM

## 2014-04-02 NOTE — Progress Notes (Signed)
[place] NOTE   Pharmacy Consult for TPN Indication: pneumoperitoneum with bowel resection  Allergies  Allergen Reactions  . Ivp Dye [Iodinated Diagnostic Agents] Swelling  . Lisinopril Swelling  . Iohexol Itching and Swelling        Patient Measurements: Height: 5\' 2"  (157.5 cm) Weight: 112 lb 14 oz (51.2 kg) IBW/kg (Calculated) : 50.1  Vital Signs: Temp: 99 F (37.2 C) (10/31 0404) Temp Source: Oral (10/31 0404) BP: 126/51 mmHg (10/31 0404) Pulse Rate: 75 (10/31 0404) Intake/Output from previous day: 10/30 0701 - 10/31 0700 In: 679.5 [I.V.:179.5; IV Piggyback:150; TPN:350] Out: 225 [Stool:225] Intake/Output from this shift:    Labs:  Recent Labs  04/01/14 0319  WBC 12.8*  HGB 9.3*  HCT 28.2*  PLT 135*     Recent Labs  03/31/14 0640 04/01/14 0319 04/02/14 0415  NA 138 137 136*  K 3.6* 3.3* 4.6  CL 97 96 97  CO2 25 27 23   GLUCOSE 104* 114* 101*  BUN 62* 45* 62*  CREATININE 3.83* 3.19* 4.13*  CALCIUM 8.7 8.8 9.4   Estimated Creatinine Clearance: 8.7 ml/min (by C-G formula based on Cr of 4.13).    Recent Labs  03/30/14 0948  GLUCAP 100*   Insulin Requirements in the past 24 hours:  SSI dc'd 10/26  Current Nutrition:  Clinimix 5/15 at 47mL/hr   Nutritional Goals:  1500-1700 kCal, 75-85 grams of protein per day per RD recommendations 10/29  Admit: 44 YOF admitted 10/20 after transfer from [facility] with perforated bowel - CT showed free air and thickened small bowel. She was taken to OR 10/20 PM for ex lap, application of open wound VAC and sigmoid colon resection.  GI: hx duodenal ulcers; POD#9 ex lap, sigmoid colon resection. Went back to OR 10/22 for re-exp, colostomy - also found to have several liver nodules suspicious for malignancy which were biopsied. Baseline prealbumin 14.1 (slightly low) and now decreased to 9. NGT now out as patient displaced during agitation while on HD. Pt c/o sore abdomen. Diet advanced to  dysphagia diet however, no PO intake documented and continued poor appetite. Surgery recommended feeding tube for enteral nutrition. TPN previously dc'd however, MD wanting to continue until eating improves. Renal ok'd increasing TPN rate to max of 75ml/hr. Megestrol added. CT scan ordered to r/o post-op infection but pt refused Endo: hx hypothyroidism, TSH 1.6 on admit, continued on home Synthroid . No noted history of DM, no A1C on file. CBGs low-nl since starting TPN; changed IV synthroid to PO Lytes: NO LYTES IN TPN - Na 136, K 4.6, Mag 1.8, Phos 2.3 Renal: ESRD on HD MWF with extra HD Sat 10/24 due to TPN. Renal started CRRT 10/21 PM, then stopped 10/23. Planning HD today. Renal ok'd increasing TPN rate to 43ml/hr Cards: CAD s/p CABG in 2008, HF with EF 25-30%, afib.  Hepatobil: LFTs remain WNL but alk phos trending up slightly. Albumin low at 1.8. Baseline TG 199, now trig 97 - remains anemic and thrombocytopenic, no overt bleeding noted ID: Continues on Zosyn D#11 for bowel perf. Blood and urine cultures with no growth to date, trach aspirate with few candida - Afebrile, WBC WNL Best Practices: SCDs for VTE proph, MC TPN Access: CVC double lumen left IJ placed 10/20 TPN day#: 10 (started 10/21)  Plan: - Continue Clinimix 5/15 (NO ELECTROLYTES) to 74ml/hr (max per renal). No lipids for now to maximize protein intake without exceeding fluid limitations (unfortunately, we are unable to concentrate the bags since  they are pre-mixed so we are unable to meet nutrition needs with parenteral nutrition with fluid limitations). Last lipids given 10/21 - Maintenance fluids per MD - F/u long-term nutrition plan - TPN will not meet her needs unless increased fluid can be tolerated  Salome Arnt, PharmD, BCPS Pager # [phone] 04/02/2014 8:19 AM

## 2014-04-02 NOTE — Progress Notes (Signed)
Pt became agitated and pulled off all her dressings and tried to climb out of bed. Pt was redirected and reposition. Pt was informed that she had to stay in the bed for her safety and given the call bell. Bed alarm was turned back on. Will continue to monitor pt.

## 2014-04-02 NOTE — Progress Notes (Signed)
Patient ZO:XWRU:[name]      DOB: 08/19/1932      [id number]   Palliative Medicine Team at [facility] Progress Note    Subjective: On dialysis. Still complains of severe pain.  10/10 and constant.  Does not feel like fentanyl is helping but took doses at times every hour yesterday.  Appetite poor and on TPN.  Refused CT last night.     Filed Vitals:   04/02/14 0930  BP: 102/44  Pulse: 75  Temp:   Resp: 16   Physical exam: GEN: alert, on dialysis, NAD HEENT: , sclera anicteric CV: RRR LUNGS: CTAB EXT: no edema Skin: warm/dry    Assessment and plan: 80 yo female with multiple medical problems including ESRD, CHF, Afib, heart block who presented with perforated sigmoid colon s/p surgical resection.  Stay complicated by sepsis, poor appetite requiring TPN, resp failure  1. Code Status- DNR  2. Goals of Care- see note by Dr Ladona Ridgelaylor.  Hard for me to have much conversation thus far. Per Dr Lubertha Basqueaylor's note, family has felt like they are being pushed to make decisions limiting treatments that would be against her wishes.  Maybe helpful to less aggressively pursue goals and allow family to view trajectory.  Will fu Monday.   3. Symptom Management  A. Acute on Chronic Pain- using plenty of PRN boluses. She states pain is poorly controlled.  Does not give great history though.  I will start low dose fentanyl patch at 12.255mcg/hr and reduce PRN dose to 25mcg to see if we can get better control and less sedation issues.    B. Poor appetite- on megace. TPN.  Will add remeron as well  C. Anxiety- xanax PRN.  Will add remeron.     Total Time: 25 minutes >50% of time spent in counseling and coordination of care regarding above. Discussed with Dr Benjamine MolaVann and Dr Lindie SpruceWyatt.   Orvis BrillAaron J. Almina Schul D.O. Palliative Medicine Team at [facility]  Pager: [phone] Team Phone: [phone]

## 2014-04-03 DIAGNOSIS — I10 Essential (primary) hypertension: Secondary | ICD-10-CM

## 2014-04-03 DIAGNOSIS — K631 Perforation of intestine (nontraumatic): Secondary | ICD-10-CM

## 2014-04-03 LAB — BASIC METABOLIC PANEL
ANION GAP: 14 (ref 5–15)
BUN: 47 mg/dL — ABNORMAL HIGH (ref 6–23)
CHLORIDE: 95 meq/L — AB (ref 96–112)
CO2: 23 meq/L (ref 19–32)
CREATININE: 3.26 mg/dL — AB (ref 0.50–1.10)
Calcium: 8.9 mg/dL (ref 8.4–10.5)
GFR calc Af Amer: 14 mL/min — ABNORMAL LOW (ref >90)
GFR calc non Af Amer: 12 mL/min — ABNORMAL LOW (ref >90)
Glucose, Bld: 106 mg/dL — ABNORMAL HIGH (ref 70–99)
Potassium: 3.7 mEq/L (ref 3.7–5.3)
Sodium: 132 mEq/L — ABNORMAL LOW (ref 137–147)

## 2014-04-03 LAB — CBC
HCT: 26.6 % — ABNORMAL LOW (ref 36.0–46.0)
Hemoglobin: 8.6 g/dL — ABNORMAL LOW (ref 12.0–15.0)
MCH: 32.8 pg (ref 26.0–34.0)
MCHC: 32.3 g/dL (ref 30.0–36.0)
MCV: 101.5 fL — ABNORMAL HIGH (ref 78.0–100.0)
Platelets: 171 10*3/uL (ref 150–400)
RBC: 2.62 MIL/uL — AB (ref 3.87–5.11)
RDW: 16.6 % — ABNORMAL HIGH (ref 11.5–15.5)
WBC: 12.5 10*3/uL — AB (ref 4.0–10.5)

## 2014-04-03 MED ORDER — CLINIMIX/DEXTROSE (5/15) 5 % IV SOLN
INTRAVENOUS | Status: AC
  Administered 2014-04-03: 17:00:00 via INTRAVENOUS
  Filled 2014-04-03: qty 2000

## 2014-04-03 MED ORDER — WITCH HAZEL-GLYCERIN EX PADS
MEDICATED_PAD | Freq: Two times a day (BID) | CUTANEOUS | Status: DC | PRN
  Administered 2014-04-04: 14:00:00 via TOPICAL
  Filled 2014-04-03: qty 100

## 2014-04-03 NOTE — Progress Notes (Signed)
10 Days Post-Op  Subjective: Frail complains of chronic pain, functionally unable to get OOB.  She doesn't really know what she wants to do next.  Course breath sounds, pacing, partial HD yesterday.  Objective: Vital signs in last 24 hours: Temp:  [97.5 F (36.4 C)-99.4 F (37.4 C)] 98 F (36.7 C) (11/01 0755) Pulse Rate:  [74-79] 75 (11/01 0755) Resp:  [12-23] 12 (11/01 0755) BP: (87-121)/(37-84) 119/49 mmHg (11/01 0755) SpO2:  [94 %-100 %] 100 % (11/01 0852) Weight:  [45.36 kg (100 lb)-46.9 kg (103 lb 6.3 oz)] 45.36 kg (100 lb) (11/01 0427) Last BM Date: 04/01/14 AFEBRILE, VSS WBC IS UP, but stable No films Intake/Output from previous day: 10/31 0701 - 11/01 0700 In: 650 [I.V.:100; IV Piggyback:50; TPN:500] Out: 1037 [Stool:75] Intake/Output this shift: Total I/O In: 60 [I.V.:10; TPN:50] Out: -   General appearance: alert, fatigued and woman, very frail, but no acute distress.  she say her pain is constant. Resp: course BS, ronchi, she can't clear congestion,  Cardio: regular rate and rhythm, S1, S2 normal, no murmur, click, rub or gallop and she is completely paced right now. GI: soft, mildly distended.  her ostomy is a bit edematous, but it's pink with good stool and gas coming into bag.  Open wound looks OK, some white nonviable stuff along the base in places but most of it is pink and looks OK.  Lab Results:   Recent Labs  04/01/14 0319 04/03/14 0149  WBC 12.8* 12.5*  HGB 9.3* 8.6*  HCT 28.2* 26.6*  PLT 135* 171    BMET  Recent Labs  04/02/14 0415 04/03/14 0148  NA 136* 132*  K 4.6 3.7  CL 97 95*  CO2 23 23  GLUCOSE 101* 106*  BUN 62* 47*  CREATININE 4.13* 3.26*  CALCIUM 9.4 8.9   PT/INR No results for input(s): LABPROT, INR in the last 72 hours.   Recent Labs Lab 03/28/14 0515 03/29/14 1130 03/30/14 0420  AST 30 25 30   ALT 20 20 19   ALKPHOS 125* 153* 168*  BILITOT 0.8 0.7 0.5  PROT 5.1* 5.2* 5.0*  ALBUMIN 2.0* 1.9* 1.8*     Lipase   No results found for: LIPASE   Studies/Results: No results found.  Medications: . antiseptic oral rinse  7 mL Mouth Rinse BID  . arformoterol  15 mcg Nebulization BID  . budesonide (PULMICORT) nebulizer solution  0.5 mg Nebulization BID  . feeding supplement (ENSURE)  1 Container Oral TID BM  . fentaNYL  12.5 mcg Transdermal Q72H  . levothyroxine  150 mcg Oral QAC breakfast  . megestrol  400 mg Oral BID  . mirtazapine  7.5 mg Oral QHS  . multivitamin with minerals  1 tablet Oral Daily  . olopatadine  1 drop Both Eyes BID  . ondansetron (ZOFRAN) IV  4 mg Intravenous 3 times per day  . pantoprazole (PROTONIX) IV  40 mg Intravenous Q24H  . piperacillin-tazobactam (ZOSYN)  IV  2.25 g Intravenous Q8H  . tiotropium  18 mcg Inhalation Daily    Assessment/Plan Sigmoid perforation with intestinal ischemia.03/23/14 Dr. Andrey CampanileWilson 1.  EXPLORATORY LAPAROTOMY, APPLICATION OF OPEN WOUND VAC (ABTHERA). COLON RESECTION SIGMOID 2.  EXPLORATORY LAPAROTOMY, COLOSTOMY, LIVER BIOPSY  FINDING:  STILL WITH PATCHY AREAS OF ISCHEMIA BUT NONE OF THESE WERE CIRCUMFERENTIAL.  SEVERAL NODULES ON THE LIVER SUSPICIOUS FOR MALIGNANCY WERE BIOPSIED Acute on chronic respiratory failure ESRD on HD (stopped early yesterday) Sepsis/shock Chronic CHF/atrial fibrillation/CHB with PTVP/S/p CABG FUNCTIONAL Quadraplegia COPD/onging tobacco  use HX of noncompliance Chronic abdominal pain Hypothyroid/hyperparathyroidism Palliative following-goals and family wishes are being discussed. DNR   Plan:  I changed her dressing her ostomy is working, she is on a dysphagia diet.  She still does not want a CT scan. Receiving fentanyl for pain.  We could try something PO if we think this is just chronic. She is still on Zosyn day 13 with ongoing elevated WBC. Major issues are how to proceed from here, and she is being seen by Palliative and having those discussions. SCD's added for DVT.   LOS: 12 days     [name] 04/03/2014

## 2014-04-03 NOTE — Progress Notes (Signed)
[name] Progress Note  Assessment/Plan: 1. S/p partial colectomy/colostomy for ruptured sigmoid/ischemic bowel - on Zosyn; WBC is stable. AFebrile 2. ESRD - Outpt MWF.  As outpt she has frequent early termination of therapy.  Yesterday refused full treatment HD with narcotics and in her room.  Pt is not toelrating HD anymore.  Palliative care working with patient.  Plan next Tx Tuesday.  3. Anemia - Hgb stable in 9s - not on ESA prior to admission- resumed 25 per week  4. Secondary hyperparathyroidism -  No vit D Ca 8.7 P 2.3  5. HTN/volume - Cont gentle UF w/ HD 6. Nutrition - TPN at 7350mL/hr an hr.  She is not eating.  Refuses enteral nutrition 7. DM 8. COPD 9. Thrombocytopenia -  10. Severe deconditioning - may need LTC if she doesn't improve  Maria Heckyan Ashlinn Hemrick MD 04/03/2014,8:37 AM  LOS: 12 days   Subjective:    Stopped treatment early (>1h remaining, in 3h tx) desite narcotics and pleading Cont to have disconnect between her goals of care and ability to participate in those goals  Objective Filed Vitals:   04/02/14 2019 04/02/14 2330 04/03/14 0427 04/03/14 0755  BP:  121/49 107/50 119/49  Pulse:  75 75 75  Temp:  97.8 F (36.6 C) 98 F (36.7 C) 98 F (36.7 C)  TempSrc:  Oral Oral Oral  Resp:  13 23 12   Height:      Weight:   45.36 kg (100 lb)   SpO2: 99% 99% 100% 100%   Physical Exam General: frail, ill weak emaciated rattles with breathing Heart: RRR Lungs: coarse BS throughout Abdomen: colostomy green stool Extremities: no sig edema Dialysis Access: left upper AVF + bruit  Dialysis Orders: MWF [facility]  3.5h 44.5kg 2/2.25 Bath 350/800 Heparin none Profile 2  No meds  Additional Objective Labs: Basic Metabolic Panel:  Recent Labs Lab 03/28/14 0515 03/29/14 1130 03/30/14 0420  04/01/14 0319 04/02/14 0415 04/03/14 0148  NA 134* 140 140  < > 137 136* 132*  K 3.6* 3.7 3.6*  < > 3.3* 4.6 3.7  CL 95* 99 99  < > 96 97 95*  CO2 24 27 26   < > 27  23 23   GLUCOSE 88 122* 105*  < > 114* 101* 106*  BUN 58* 33* 45*  < > 45* 62* 47*  CREATININE 2.86* 2.16* 2.86*  < > 3.19* 4.13* 3.26*  CALCIUM 8.2* 8.2* 8.3*  < > 8.8 9.4 8.9  PHOS 2.3 1.9* 2.3  --   --   --   --   < > = values in this interval not displayed. Liver Function Tests:  Recent Labs Lab 03/28/14 0515 03/29/14 1130 03/30/14 0420  AST 30 25 30   ALT 20 20 19   ALKPHOS 125* 153* 168*  BILITOT 0.8 0.7 0.5  PROT 5.1* 5.2* 5.0*  ALBUMIN 2.0* 1.9* 1.8*   CBC:  Recent Labs Lab 03/28/14 0515 03/29/14 0530 03/29/14 1130 03/30/14 0420 04/01/14 0319 04/03/14 0149  WBC 7.3 7.9 8.6 9.8 12.8* 12.5*  NEUTROABS 5.6  --  6.9 8.4*  --   --   HGB 9.5* 9.9* 9.7* 9.4* 9.3* 8.6*  HCT 29.3* 30.1* 30.0* 28.9* 28.2* 26.6*  MCV 98.3 98.7 102.7* 100.0 100.7* 101.5*  PLT 54* 62* 70* 86* 135* 171  CBG:  Recent Labs Lab 03/28/14 0616 03/29/14 0213 03/29/14 1636 03/29/14 2351 03/30/14 0948  GLUCAP 96 103* 124* 106* 100*  Studies/Results: No results found. Medications: . sodium  chloride 10 mL/hr at 04/01/14 0700  . TPN (CLINIMIX) Adult without lytes 50 mL/hr at 04/02/14 1746  . TPN (CLINIMIX) Adult without lytes     . antiseptic oral rinse  7 mL Mouth Rinse BID  . arformoterol  15 mcg Nebulization BID  . budesonide (PULMICORT) nebulizer solution  0.5 mg Nebulization BID  . feeding supplement (ENSURE)  1 Container Oral TID BM  . fentaNYL  12.5 mcg Transdermal Q72H  . levothyroxine  150 mcg Oral QAC breakfast  . megestrol  400 mg Oral BID  . mirtazapine  7.5 mg Oral QHS  . multivitamin with minerals  1 tablet Oral Daily  . olopatadine  1 drop Both Eyes BID  . ondansetron (ZOFRAN) IV  4 mg Intravenous 3 times per day  . pantoprazole (PROTONIX) IV  40 mg Intravenous Q24H  . piperacillin-tazobactam (ZOSYN)  IV  2.25 g Intravenous Q8H  . tiotropium  18 mcg Inhalation Daily

## 2014-04-03 NOTE — Progress Notes (Addendum)
Patient ID: AYDE RECORD  female  RUE:[id number]    DOB: Feb 01, 1933    DOA: 03/30/2014  PCP: Galvin Proffer, MD   Admit HPI / Brief Narrative:  80 y.o. female with history of CAD s/p CABG, systolic CHF, COPD, tobacco use, and ESRD who presented with AMS and dyspnea and was found to have a pneumoperitoneum. Difficult extubation was expected postoperatively and PCCM was consulted.  Significant Events:  10/20 Ex lap and removal of necrotic bowel; on neo  10/21 On levophed and neo  10/22 Back to OR, fascial closure. Finding of multiple liver nodules not previously noted. Liver biopsy obtained  10/22 Family conference. Pt made DNR in event of cardiac arrest.  10/23 Off vasopressors, extubated with adequate resp status post extubaiton  10/24 Stable for tx to SDU  10/25 [facility] assumed care  Since the hospitalist have assume the patient's care the primary focus has been advancement of her diet/nutritional status. Her respiratory status remains stable. Her peritonitis has essentially resolved. Her chronic health problems are currently well compensated. Patient was transferred to renal floor on 10/28 but moved back to stepdown unit on 10/29.    Assessment/Plan:  Acute on chronic respiratory failure, post operative VDRF : -coarse breath sounds, patient has history of severe COPD, ongoing tobacco abuse, extubated on 10/23, not completing hemodialysis - chest x-ray 10/29 showed increasing bilateral atelectasis, placed on Zosyn,  -[place] - Cont nebulized steroids and BD's, ordered flutter valve and incentive spirometry, O2, deep suctioning  - Needs hemodialysis for volume management, however patient has not been completing or refusing (see nephrology notes)   Severe sepsis and septic shock, secondary to peritonitis  -  improved, respiratory culture had shown few Candida albicans otherwise blood cultures, urine cultures all negative, patient had received Zosyn for 7 days, finished on 10/27.  -zosyn  restarted 10/30  Perforated sigmoid colon due to Intestinal ischemia  - Post laparotomy, sigmoid colectomy and liver biopsy, creation of colostomy with abdominal closure  - On TPN, patient needs to start eating slowly, on dysphagia 1 diet however she is not eating - Needs PT, OT, increase ambulation - Surgery following closely, Wet-dressing bid to lower midline incision  - We will continue TPN until patient establishes that she is tolerating diet and eating - Placed on fentanyl for pain control, palliative medicine following closely  ESRD (HD on MWF)  - per Nephrology, discussed with Dr. Marisue Humble today, difficult situation. As outpatient also, she has frequently terminated her hemodialysis early. Patient had refused hemodialysis on 10/28, went for hemodialysis on 10/29 but stopped early due to pain and cramping. She is volume overloaded. Palliative medicine consult was called, 10/29, per goals of care, patient and family requested to continue with hemodialysis and better pain control.   Chronic systolic CHF, ESRD - HD for volume control   History of atrial fibrillation  - rate controlled, in NSR   History of complete heart block with pacemaker  CAD s/p 3v CABG  -No chest pain    Liver nodules  - biopsy results indicate benign process  - colon pathology negative for malignancy   Anemia of CKD  - no signs of active bleeding  - follow CBC   Thrombocytopenia  - continue holding Heparin products - Plt slowly improving  - SCD's for DVT prophylaxis   Hypothyroidism  - Continue home Synthroid dose   Severe PCM  - secondary to acute illness with complications outlined above - patient states she is not interested  in having an NG tube placed  -not eating well -TPN  Acute functional quadriplegia  - secondary to acute illness as noted above  - PT/OT evaluation pending  Hypokalemia-replaced  DVT Prophylaxis: SCDs  Code Status: DO NOT RESUSCITATE  Family Communication: daughter  on phone  Disposition:Not medically stable yet/LTAC???  Consultants:  Nephrology  Gen. Surgery  Ccm   Palliative care  Procedures: Ex lap and removal of necrotic bowel   Antibiotics:  zosyn   Subjective: Not eating well confusion last PM- on bed alarm  Objective: Weight change: -3.8 kg (-8 lb 6 oz)  Intake/Output Summary (Last 24 hours) at 04/03/14 0734 Last data filed at 04/03/14 0428  Gross per 24 hour  Intake    590 ml  Output   1037 ml  Net   -447 ml   Blood pressure 107/50, pulse 75, temperature 98 F (36.7 C), temperature source Oral, resp. rate 23, height 5\' 2"  (1.575 m), weight 45.36 kg (100 lb), SpO2 100 %.  Physical Exam: General: Alert and awake, oriented to place, not time CVS: S1-S2 clear, no murmur rubs or gallops Chest: diffuse rhonchi  bilaterally  Abdomen: tender, midline incision, dressing intact  Extremities: no cyanosis, clubbing or edema noted bilaterally   Lab Results: Basic Metabolic Panel:  Recent Labs Lab 03/30/14 0420  04/02/14 0415 04/03/14 0148  NA 140  < > 136* 132*  K 3.6*  < > 4.6 3.7  CL 99  < > 97 95*  CO2 26  < > 23 23  GLUCOSE 105*  < > 101* 106*  BUN 45*  < > 62* 47*  CREATININE 2.86*  < > 4.13* 3.26*  CALCIUM 8.3*  < > 9.4 8.9  MG 1.8  --   --   --   PHOS 2.3  --   --   --   < > = values in this interval not displayed. Liver Function Tests:  Recent Labs Lab 03/29/14 1130 03/30/14 0420  AST 25 30  ALT 20 19  ALKPHOS 153* 168*  BILITOT 0.7 0.5  PROT 5.2* 5.0*  ALBUMIN 1.9* 1.8*   No results for input(s): LIPASE, AMYLASE in the last 168 hours. No results for input(s): AMMONIA in the last 168 hours. CBC:  Recent Labs Lab 03/30/14 0420 04/01/14 0319 04/03/14 0149  WBC 9.8 12.8* 12.5*  NEUTROABS 8.4*  --   --   HGB 9.4* 9.3* 8.6*  HCT 28.9* 28.2* 26.6*  MCV 100.0 100.7* 101.5*  PLT 86* 135* 171   Cardiac Enzymes: No results for input(s): CKTOTAL, CKMB, CKMBINDEX, TROPONINI in the last 168  hours. BNP: Invalid input(s): POCBNP CBG:  Recent Labs Lab 03/28/14 0616 03/29/14 0213 03/29/14 1636 03/29/14 2351 03/30/14 0948  GLUCAP 96 103* 124* 106* 100*     Micro Results: No results found for this or any previous visit (from the past 240 hour(s)).  Studies/Results: Dg Chest 2 View  03/15/2014   CLINICAL DATA:  COUGH ABDOMINAL PAIN cough.  Shortness of breath.  EXAM: CHEST  2 VIEW  COMPARISON:  CT abdomen 01/31/2014. Multiple chest radiographs dating back to 06/07/2013.  FINDINGS: Cardiomegaly. Median sternotomy/ CABG. Pulmonary vascular congestion is present. Diffuse interstitial prominence likely rib relates to chronic congestive heart failure. Bilateral pleural apical thickening. Thoracic compression fractures present. Coronary artery stents. Two lead RIGHT subclavian cardiac pacemaker leads are unchanged. Calcified av fistula is present in the LEFT arm.  IMPRESSION: Chronic cardiomegaly and pulmonary vascular congestion. No interval change or acute  abnormality.   Electronically Signed   By: Andreas NewportGeoffrey  Lamke M.D.   On: 03/15/2014 18:47   Dg Chest Port 1 View  03/23/2014   CLINICAL DATA:  Check central line placement  EXAM: PORTABLE CHEST - 1 VIEW  COMPARISON:  03/23/2014 1814 hrs  FINDINGS: Cardiac shadow is stable. Postsurgical changes are again seen. A pacing device is again noted and stable. The endotracheal tube is noted 4.4 cm above the carina. A nasogastric catheter courses towards the stomach. The left central venous line has been advanced and now lies with the tip in the proximal superior vena cava. No pneumothorax is noted. Bibasilar infiltrates are again identified.  IMPRESSION: Tubes and lines as described.  Stable bibasilar changes.   Electronically Signed   By: Alcide CleverMark  Lukens M.D.   On: 03/23/2014 20:25   Dg Chest Port 1 View  03/23/2014   CLINICAL DATA:  Left central line placement.  EXAM: PORTABLE CHEST - 1 VIEW  COMPARISON:  03/23/2014 at 24:49 a.m.  FINDINGS:  Endotracheal tube tip is indistinct but believed to be about 4.4 cm above the carina. Nasogastric tube enters the stomach. Left IJ line appears to of retracted slightly and now projects over the bottom of the aortic arch. This is probably in the innominate vein.  Prior CABG. Indistinct pulmonary vasculature. Stable low level airspace opacity in the right infrahilar region and retrocardiac region. coronary stent.  IMPRESSION: 1. Left IJ line has retracted somewhat, with tip projecting over the lower margin of the thoracic aorta, but likely in a venous structure given prior positioning. One could perform lateral view or chest CT to confirm line positioning. 2. Stable mild bibasilar airspace opacities. Interstitial accentuation in the upper lung zones may reflect mild pulmonary venous hypertension.   Electronically Signed   By: Herbie BaltimoreWalt  Liebkemann M.D.   On: 03/23/2014 18:44   Dg Chest Port 1 View  03/23/2014   CLINICAL DATA:  Evaluate endotracheal tube placement  EXAM: PORTABLE CHEST - 1 VIEW  COMPARISON:  Chest x-ray of 03/15/2014  FINDINGS: The tip of the endotracheal tube is approximately 2.9 cm above the carina. Cardiomegaly and minimal pulmonary vascular congestion remains. Left central venous line is present with the tip seen to the junction of the left innominate vein with the SVC. Pacemaker remains.  IMPRESSION: 1. Endotracheal tube 2.9 cm above the carina. 2. Left IJ central venous line tip near the junction of the left innominate vein and SVC. 3. Stable cardiomegaly with minimal congestion endotracheal tube placement   Electronically Signed   By: Dwyane DeePaul  Barry M.D.   On: 03/23/2014 07:52   Dg Chest Port 1v Same Day  03/31/2014   CLINICAL DATA:  Dyspnea and aspiration pneumonia  EXAM: PORTABLE CHEST - 1 VIEW SAME DAY  COMPARISON:  03/26/2014  FINDINGS: Cardiac shadow is within normal limits. Postsurgical changes are again noted. A pacing device is stable. The left central venous line appears to have been  withdrawn in the interval. The tip now lies over the aortic knob. Clinical correlation is recommended. The lungs are well aerated bilaterally. Increased left retrocardiac atelectasis and right basilar atelectasis is noted.  IMPRESSION: Increasing bibasilar atelectasis.  Interval withdrawal of the left central venous line now lying over the aortic knob. Clinical correlation is recommended.   Electronically Signed   By: Alcide CleverMark  Lukens M.D.   On: 03/31/2014 08:02   Dg Chest Port 1v Same Day  03/26/2014   CLINICAL DATA:  Pulmonary edema, history of CHF, coronary artery  disease post CABG, COPD, hypertension, end-stage renal disease on dialysis, atrial fibrillation  EXAM: PORTABLE CHEST - 1 VIEW SAME DAY  COMPARISON:  Portable exam 1213 hr compared to 03/23/2014  FINDINGS: Nasogastric tube extends into stomach.  LEFT jugular central venous catheter tip projects over SVC.  RIGHT subclavian sequential pacemaker leads project over RIGHT atrium and RIGHT ventricle.  Enlargement of cardiac silhouette post CABG.  Pulmonary vascular congestion.  Peribronchial thickening and slight chronic accentuation of interstitial markings likely reflect minimal failure.  No segmental infiltrate, pleural effusion or pneumothorax.  Bones demineralized.  IMPRESSION: Enlargement of cardiac silhouette post CABG and pacemaker.  Question minimal pulmonary edema.   Electronically Signed   By: Ulyses Southward M.D.   On: 03/26/2014 14:22   Dg Abd Portable 1v  03/26/2014   CLINICAL DATA:  NG tube placement.  EXAM: PORTABLE ABDOMEN - 1 VIEW  COMPARISON:  03/23/2014  FINDINGS: NG tube is seen, entering the left lung with the tip at the left lung base. Recommend complete removal and replacement.  Nonobstructive bowel gas pattern. Heavily calcified aorta. The right growing central line is in place with the tip in the lower abdomen.  IMPRESSION: NG tube in the left lung with the tip in the left lung base.  Critical Value/emergent results were called by  telephone at the time of interpretation on 03/26/2014 at 7:11 pm to patient's nurse Georgeann Oppenheim who verbally acknowledged these results.   Electronically Signed   By: Charlett Nose M.D.   On: 03/26/2014 19:12   Dg Abd Portable 1v  03/23/2014   CLINICAL DATA:  Encounter for nasogastric tube placement  EXAM: PORTABLE ABDOMEN - 1 VIEW  COMPARISON:  CT abdomen pelvis of 03/14/2014  FINDINGS: The tip of the feeding tube is below the hemidiaphragm probably overlying the mid portion of the stomach. The bowel gas pattern is nonspecific. Considerable arterial calcification is present throughout the abdominal aorta and iliac arteries.  IMPRESSION: Tip of NG tube overlies the region of the body of the stomach.   Electronically Signed   By: Dwyane Dee M.D.   On: 03/23/2014 07:49    Medications: Scheduled Meds: . antiseptic oral rinse  7 mL Mouth Rinse BID  . arformoterol  15 mcg Nebulization BID  . budesonide (PULMICORT) nebulizer solution  0.5 mg Nebulization BID  . feeding supplement (ENSURE)  1 Container Oral TID BM  . fentaNYL  12.5 mcg Transdermal Q72H  . levothyroxine  150 mcg Oral QAC breakfast  . megestrol  400 mg Oral BID  . mirtazapine  7.5 mg Oral QHS  . multivitamin with minerals  1 tablet Oral Daily  . olopatadine  1 drop Both Eyes BID  . ondansetron (ZOFRAN) IV  4 mg Intravenous 3 times per day  . pantoprazole (PROTONIX) IV  40 mg Intravenous Q24H  . piperacillin-tazobactam (ZOSYN)  IV  2.25 g Intravenous Q8H  . tiotropium  18 mcg Inhalation Daily   Time 35 min   LOS: 12 days   Tanaka Gillen  DO [facility] 04/03/2014, 7:34 AM Pager: [phone]  If 7PM-7AM, please contact night-coverage [email or web address] Password [email or web address]

## 2014-04-03 NOTE — Progress Notes (Signed)
[place] NOTE   Pharmacy Consult for TPN Indication: pneumoperitoneum with bowel resection  Allergies  Allergen Reactions  . Ivp Dye [Iodinated Diagnostic Agents] Swelling  . Lisinopril Swelling  . Iohexol Itching and Swelling        Patient Measurements: Height: 5\' 2"  (157.5 cm) Weight: 100 lb (45.36 kg) IBW/kg (Calculated) : 50.1  Vital Signs: Temp: 98 F (36.7 C) (11/01 0755) Temp Source: Oral (11/01 0755) BP: 119/49 mmHg (11/01 0755) Pulse Rate: 75 (11/01 0755) Intake/Output from previous day: 10/31 0701 - 11/01 0700 In: 590 [I.V.:90; IV Piggyback:50; TPN:450] Out: 1037 [Stool:75] Intake/Output from this shift:    Labs:  Recent Labs  04/01/14 0319 04/03/14 0149  WBC 12.8* 12.5*  HGB 9.3* 8.6*  HCT 28.2* 26.6*  PLT 135* 171     Recent Labs  04/01/14 0319 04/02/14 0415 04/03/14 0148  NA 137 136* 132*  K 3.3* 4.6 3.7  CL 96 97 95*  CO2 27 23 23   GLUCOSE 114* 101* 106*  BUN 45* 62* 47*  CREATININE 3.19* 4.13* 3.26*  CALCIUM 8.8 9.4 8.9   Estimated Creatinine Clearance: 10 mL/min (by C-G formula based on Cr of 3.26).   No results for input(s): GLUCAP in the last 72 hours. Insulin Requirements in the past 24 hours:  SSI dc'd 10/26  Current Nutrition:  Clinimix 5/15 at 93mL/hr   Nutritional Goals:  1500-1700 kCal, 75-85 grams of protein per day per RD recommendations 10/29  Admit: 4 YOF admitted 10/20 after transfer from [facility] with perforated bowel - CT showed free air and thickened small bowel. She was taken to OR 10/20 PM for ex lap, application of open wound VAC and sigmoid colon resection.  GI: hx duodenal ulcers; POD#10 ex lap, sigmoid colon resection. Went back to OR 10/22 for re-exp, colostomy - also found to have several liver nodules suspicious for malignancy which were biopsied. Baseline prealbumin 14.1 (slightly low) and now decreased to 9. NGT now out as patient displaced during agitation while on HD. Pt c/o sore  abdomen. Diet advanced to dysphagia diet however, no PO intake documented and continued poor appetite. Surgery recommended feeding tube for enteral nutrition. TPN previously dc'd however, MD wanting to continue until eating improves. Renal ok'd increasing TPN rate to max of 13ml/hr. Megestrol added. CT scan ordered to r/o post-op infection but pt refused. No PO intake documented in the past 24 hours. However, she has been having 1-2 containers of ensure pudding Endo: hx hypothyroidism, TSH 1.6 on admit, continued on home Synthroid . No noted history of DM, no A1C on file. CBGs low-nl since starting TPN; changed IV synthroid to PO Lytes: NO LYTES IN TPN - Na 132, K 3.7, Mag 1.8, Phos 2.3 Renal: ESRD on HD MWF with extra HD Sat 10/24 due to TPN. Renal started CRRT 10/21 PM, then stopped 10/23. Pt wants continued treatment but either refusing HD or requesting to discontinue HD early. Renal ok'd increasing TPN rate to 12ml/hr Cards: CAD s/p CABG in 2008, HF with EF 25-30%, afib.  Hepatobil: LFTs remain WNL but alk phos trending up slightly. Albumin low at 1.8. Baseline TG 199, now trig 97 - remains anemic but thrombocytopenia is improved, no overt bleeding noted ID: Continues on Zosyn D#12 for bowel perf. Blood and urine cultures with no growth to date, trach aspirate with few candida - Afebrile, WBC 12.5 Best Practices: SCDs for VTE proph, MC TPN Access: CVC double lumen left IJ placed 10/20 TPN day#: 11 (started  10/21)  Plan: - Continue Clinimix 5/15 (NO ELECTROLYTES) at 26ml/hr (max per renal) which provides 60gm protein/day (80% goal) and 852 Kcal/day (57% goal). Unfortunately, we are unable to concentrate the bags since they are pre-mixed so we are unable to meet nutrition needs with parenteral nutrition due to fluid limitations. No lipids for now to maximize protein intake without exceeding fluid limitations. Last parenteral lipids given 10/21 (receiving lipids from ensure supplements) - Maintenance  fluids per MD - F/u long-term nutrition plan - TPN will not meet her needs unless increased fluid can be tolerated - F/u AM labs  [name], PharmD, BCPS Pager # [phone] 04/03/2014 7:59 AM

## 2014-04-03 DEATH — deceased

## 2014-04-04 DIAGNOSIS — Z515 Encounter for palliative care: Secondary | ICD-10-CM | POA: Insufficient documentation

## 2014-04-04 DIAGNOSIS — N186 End stage renal disease: Secondary | ICD-10-CM | POA: Insufficient documentation

## 2014-04-04 DIAGNOSIS — R109 Unspecified abdominal pain: Secondary | ICD-10-CM | POA: Insufficient documentation

## 2014-04-04 DIAGNOSIS — R630 Anorexia: Secondary | ICD-10-CM | POA: Insufficient documentation

## 2014-04-04 DIAGNOSIS — Z992 Dependence on renal dialysis: Secondary | ICD-10-CM

## 2014-04-04 LAB — CBC
HEMATOCRIT: 27.8 % — AB (ref 36.0–46.0)
Hemoglobin: 9.2 g/dL — ABNORMAL LOW (ref 12.0–15.0)
MCH: 33 pg (ref 26.0–34.0)
MCHC: 33.1 g/dL (ref 30.0–36.0)
MCV: 99.6 fL (ref 78.0–100.0)
Platelets: 257 10*3/uL (ref 150–400)
RBC: 2.79 MIL/uL — ABNORMAL LOW (ref 3.87–5.11)
RDW: 16.2 % — ABNORMAL HIGH (ref 11.5–15.5)
WBC: 12.5 10*3/uL — ABNORMAL HIGH (ref 4.0–10.5)

## 2014-04-04 LAB — COMPREHENSIVE METABOLIC PANEL
ALK PHOS: 138 U/L — AB (ref 39–117)
ALT: 15 U/L (ref 0–35)
AST: 19 U/L (ref 0–37)
Albumin: 1.8 g/dL — ABNORMAL LOW (ref 3.5–5.2)
Anion gap: 17 — ABNORMAL HIGH (ref 5–15)
BILIRUBIN TOTAL: 0.3 mg/dL (ref 0.3–1.2)
BUN: 70 mg/dL — ABNORMAL HIGH (ref 6–23)
CHLORIDE: 92 meq/L — AB (ref 96–112)
CO2: 21 meq/L (ref 19–32)
CREATININE: 4.15 mg/dL — AB (ref 0.50–1.10)
Calcium: 10.1 mg/dL (ref 8.4–10.5)
GFR calc Af Amer: 11 mL/min — ABNORMAL LOW (ref >90)
GFR, EST NON AFRICAN AMERICAN: 9 mL/min — AB (ref >90)
Glucose, Bld: 121 mg/dL — ABNORMAL HIGH (ref 70–99)
Potassium: 4 mEq/L (ref 3.7–5.3)
Sodium: 130 mEq/L — ABNORMAL LOW (ref 137–147)
Total Protein: 5.4 g/dL — ABNORMAL LOW (ref 6.0–8.3)

## 2014-04-04 LAB — DIFFERENTIAL
BASOS PCT: 0 % (ref 0–1)
Basophils Absolute: 0 10*3/uL (ref 0.0–0.1)
EOS ABS: 0.2 10*3/uL (ref 0.0–0.7)
Eosinophils Relative: 2 % (ref 0–5)
Lymphocytes Relative: 7 % — ABNORMAL LOW (ref 12–46)
Lymphs Abs: 0.9 10*3/uL (ref 0.7–4.0)
MONO ABS: 0.5 10*3/uL (ref 0.1–1.0)
MONOS PCT: 4 % (ref 3–12)
Neutro Abs: 10.8 10*3/uL — ABNORMAL HIGH (ref 1.7–7.7)
Neutrophils Relative %: 87 % — ABNORMAL HIGH (ref 43–77)

## 2014-04-04 LAB — MAGNESIUM: Magnesium: 1.7 mg/dL (ref 1.5–2.5)

## 2014-04-04 LAB — TRIGLYCERIDES: Triglycerides: 110 mg/dL (ref <150)

## 2014-04-04 LAB — PHOSPHORUS: Phosphorus: 3.4 mg/dL (ref 2.3–4.6)

## 2014-04-04 MED ORDER — OXYMETAZOLINE HCL 0.05 % NA SOLN
1.0000 | Freq: Two times a day (BID) | NASAL | Status: AC
  Administered 2014-04-04 – 2014-04-06 (×5): 1 via NASAL
  Filled 2014-04-04: qty 15

## 2014-04-04 MED ORDER — MAGNESIUM SULFATE IN D5W 10-5 MG/ML-% IV SOLN
1.0000 g | Freq: Once | INTRAVENOUS | Status: AC
  Administered 2014-04-04: 1 g via INTRAVENOUS
  Filled 2014-04-04: qty 100

## 2014-04-04 MED ORDER — CLINIMIX/DEXTROSE (5/15) 5 % IV SOLN
INTRAVENOUS | Status: AC
  Administered 2014-04-04: 19:00:00 via INTRAVENOUS
  Filled 2014-04-04: qty 2000

## 2014-04-04 MED ORDER — FAT EMULSION 20 % IV EMUL
250.0000 mL | INTRAVENOUS | Status: AC
  Administered 2014-04-04: 250 mL via INTRAVENOUS
  Filled 2014-04-04: qty 250

## 2014-04-04 NOTE — Progress Notes (Signed)
Patient ID: Maria LefevreVera M [name]  female  WUJ:[id number]    DOB: 1932/06/23    DOA: 03/07/2014  PCP: Galvin ProfferHAGUE, IMRAN P, MD   Admit HPI / Brief Narrative:  [age] y.o. female with history of CAD s/p CABG, systolic CHF, COPD, tobacco use, and ESRD who presented with AMS and dyspnea and was found to have a pneumoperitoneum. Difficult extubation was expected postoperatively and PCCM was consulted.  Significant Events:  10/20 Ex lap and removal of necrotic bowel; on neo  10/21 On levophed and neo  10/22 Back to OR, fascial closure. Finding of multiple liver nodules not previously noted. Liver biopsy obtained  10/22 Family conference. Pt made DNR in event of cardiac arrest.  10/23 Off vasopressors, extubated with adequate resp status post extubaiton  10/24 Stable for tx to SDU  10/25 [facility] assumed care  Since the hospitalist have assume the patient's care the primary focus has been advancement of her diet/nutritional status. Patient has been refusing full dialysis, refusing to eat, refusing CT scan Patient was transferred to renal floor on 10/28 but moved back to stepdown unit on 10/29  Assessment/Plan:  Acute on chronic respiratory failure, post operative VDRF : -coarse breath sounds, patient has history of severe COPD, ongoing tobacco abuse, extubated on 10/23, not completing hemodialysis - chest x-ray 10/29 showed increasing bilateral atelectasis, placed on Zosyn,  -Botkins - Cont nebulized steroids and BD's, ordered flutter valve and incentive spirometry, O2, deep suctioning  - Needs hemodialysis for volume management, however patient has not been completing or refusing (see nephrology notes)   Severe sepsis and septic shock, secondary to peritonitis  -  improved, respiratory culture had shown few Candida albicans otherwise blood cultures, urine cultures all negative, patient had received Zosyn for 7 days, finished on 10/27.  -zosyn restarted 10/30  Perforated sigmoid colon due to Intestinal ischemia  -  Post laparotomy, sigmoid colectomy and liver biopsy, creation of colostomy with abdominal closure  - On TPN, patient needs to start eating slowly, on dysphagia 1 diet however she is not eating - Needs PT, OT, increase ambulation - Surgery following closely, Wet-dressing bid to lower midline incision  - We will continue TPN until patient establishes that she is tolerating diet and eating - Placed on fentanyl for pain control, palliative medicine following closely  ESRD (HD on MWF)  - per Nephrology, discussed with Dr. Marisue HumbleSanford today, difficult situation. As outpatient also, she has frequently terminated her hemodialysis early. Patient had refused hemodialysis on 10/28, went for hemodialysis on 10/29 but stopped early due to pain and cramping. She is volume overloaded. Palliative medicine consult was called, 10/29, per goals of care, patient and family requested to continue with hemodialysis and better pain control. Patient continues to refuse to complete dialysis  Chronic systolic CHF, ESRD - HD for volume control   History of atrial fibrillation  - rate controlled, in NSR   History of complete heart block with pacemaker  CAD s/p 3v CABG  -No chest pain    Liver nodules  - biopsy results indicate benign process  - colon pathology negative for malignancy   Anemia of CKD  - no signs of active bleeding  - follow CBC   Thrombocytopenia  - continue holding Heparin products - Plt slowly improving  - SCD's for DVT prophylaxis   Hypothyroidism  - Continue home Synthroid dose   Severe PCM  - secondary to acute illness with complications outlined above - patient states she is not interested in having  an NG tube placed  -not eating well -TPN  Acute functional quadriplegia  - secondary to acute illness as noted above  - PT/OT evaluation pending  Hypokalemia-replaced  Leukocytosis -stable at 12.5  DVT Prophylaxis: SCDs  Code Status: DO NOT RESUSCITATE  Family Communication:  daughter on phone 11/1  Disposition:Not medically stable yet/LTAC???  Consultants:  Nephrology  Gen. Surgery  Ccm   Palliative care  Procedures: Ex lap and removal of necrotic bowel   Antibiotics:  zosyn   Subjective: Wanting to get up -says when she is discharged, she is not coming back  Objective: Weight change: -2.04 kg (-4 lb 8 oz)  Intake/Output Summary (Last 24 hours) at 04/04/14 0748 Last data filed at 04/04/14 0600  Gross per 24 hour  Intake 1479.17 ml  Output    200 ml  Net 1279.17 ml   Blood pressure 130/73, pulse 75, temperature 98.4 F (36.9 C), temperature source Oral, resp. rate 24, height 5\' 2"  (1.575 m), weight 45.36 kg (100 lb), SpO2 100 %.  Physical Exam: General: Alert and awake, oriented to place, not time CVS: S1-S2 clear, no murmur rubs or gallops Chest: diffuse rhonchi  bilaterally  Abdomen: tender, midline incision, dressing intact  Extremities: no cyanosis, clubbing or edema noted bilaterally   Lab Results: Basic Metabolic Panel:  Recent Labs Lab 04/03/14 0148 04/04/14 0500  NA 132* 130*  K 3.7 4.0  CL 95* 92*  CO2 23 21  GLUCOSE 106* 121*  BUN 47* 70*  CREATININE 3.26* 4.15*  CALCIUM 8.9 10.1  MG  --  1.7  PHOS  --  3.4   Liver Function Tests:  Recent Labs Lab 03/30/14 0420 04/04/14 0500  AST 30 19  ALT 19 15  ALKPHOS 168* 138*  BILITOT 0.5 0.3  PROT 5.0* 5.4*  ALBUMIN 1.8* 1.8*   No results for input(s): LIPASE, AMYLASE in the last 168 hours. No results for input(s): AMMONIA in the last 168 hours. CBC:  Recent Labs Lab 04/03/14 0149 04/04/14 0500  WBC 12.5* 12.5*  NEUTROABS  --  10.8*  HGB 8.6* 9.2*  HCT 26.6* 27.8*  MCV 101.5* 99.6  PLT 171 257   Cardiac Enzymes: No results for input(s): CKTOTAL, CKMB, CKMBINDEX, TROPONINI in the last 168 hours. BNP: Invalid input(s): POCBNP CBG:  Recent Labs Lab 03/29/14 0213 03/29/14 1636 03/29/14 2351 03/30/14 0948  GLUCAP 103* 124* 106* 100*      Micro Results: No results found for this or any previous visit (from the past 240 hour(s)).  Studies/Results: Dg Chest 2 View  03/15/2014   CLINICAL DATA:  COUGH ABDOMINAL PAIN cough.  Shortness of breath.  EXAM: CHEST  2 VIEW  COMPARISON:  CT abdomen 01/31/2014. Multiple chest radiographs dating back to 06/07/2013.  FINDINGS: Cardiomegaly. Median sternotomy/ CABG. Pulmonary vascular congestion is present. Diffuse interstitial prominence likely rib relates to chronic congestive heart failure. Bilateral pleural apical thickening. Thoracic compression fractures present. Coronary artery stents. Two lead RIGHT subclavian cardiac pacemaker leads are unchanged. Calcified av fistula is present in the LEFT arm.  IMPRESSION: Chronic cardiomegaly and pulmonary vascular congestion. No interval change or acute abnormality.   Electronically Signed   By: Andreas Newport M.D.   On: 03/15/2014 18:47   Dg Chest Port 1 View  03/23/2014   CLINICAL DATA:  Check central line placement  EXAM: PORTABLE CHEST - 1 VIEW  COMPARISON:  03/23/2014 1814 hrs  FINDINGS: Cardiac shadow is stable. Postsurgical changes are again seen. A pacing device is  again noted and stable. The endotracheal tube is noted 4.4 cm above the carina. A nasogastric catheter courses towards the stomach. The left central venous line has been advanced and now lies with the tip in the proximal superior vena cava. No pneumothorax is noted. Bibasilar infiltrates are again identified.  IMPRESSION: Tubes and lines as described.  Stable bibasilar changes.   Electronically Signed   By: Alcide Clever M.D.   On: 03/23/2014 20:25   Dg Chest Port 1 View  03/23/2014   CLINICAL DATA:  Left central line placement.  EXAM: PORTABLE CHEST - 1 VIEW  COMPARISON:  03/23/2014 at 24:49 a.m.  FINDINGS: Endotracheal tube tip is indistinct but believed to be about 4.4 cm above the carina. Nasogastric tube enters the stomach. Left IJ line appears to of retracted slightly and  now projects over the bottom of the aortic arch. This is probably in the innominate vein.  Prior CABG. Indistinct pulmonary vasculature. Stable low level airspace opacity in the right infrahilar region and retrocardiac region. coronary stent.  IMPRESSION: 1. Left IJ line has retracted somewhat, with tip projecting over the lower margin of the thoracic aorta, but likely in a venous structure given prior positioning. One could perform lateral view or chest CT to confirm line positioning. 2. Stable mild bibasilar airspace opacities. Interstitial accentuation in the upper lung zones may reflect mild pulmonary venous hypertension.   Electronically Signed   By: Herbie Baltimore M.D.   On: 03/23/2014 18:44   Dg Chest Port 1 View  03/23/2014   CLINICAL DATA:  Evaluate endotracheal tube placement  EXAM: PORTABLE CHEST - 1 VIEW  COMPARISON:  Chest x-ray of 03/15/2014  FINDINGS: The tip of the endotracheal tube is approximately 2.9 cm above the carina. Cardiomegaly and minimal pulmonary vascular congestion remains. Left central venous line is present with the tip seen to the junction of the left innominate vein with the SVC. Pacemaker remains.  IMPRESSION: 1. Endotracheal tube 2.9 cm above the carina. 2. Left IJ central venous line tip near the junction of the left innominate vein and SVC. 3. Stable cardiomegaly with minimal congestion endotracheal tube placement   Electronically Signed   By: Dwyane Dee M.D.   On: 03/23/2014 07:52   Dg Chest Port 1v Same Day  03/31/2014   CLINICAL DATA:  Dyspnea and aspiration pneumonia  EXAM: PORTABLE CHEST - 1 VIEW SAME DAY  COMPARISON:  03/26/2014  FINDINGS: Cardiac shadow is within normal limits. Postsurgical changes are again noted. A pacing device is stable. The left central venous line appears to have been withdrawn in the interval. The tip now lies over the aortic knob. Clinical correlation is recommended. The lungs are well aerated bilaterally. Increased left retrocardiac  atelectasis and right basilar atelectasis is noted.  IMPRESSION: Increasing bibasilar atelectasis.  Interval withdrawal of the left central venous line now lying over the aortic knob. Clinical correlation is recommended.   Electronically Signed   By: Alcide Clever M.D.   On: 03/31/2014 08:02   Dg Chest Port 1v Same Day  03/26/2014   CLINICAL DATA:  Pulmonary edema, history of CHF, coronary artery disease post CABG, COPD, hypertension, end-stage renal disease on dialysis, atrial fibrillation  EXAM: PORTABLE CHEST - 1 VIEW SAME DAY  COMPARISON:  Portable exam 1213 hr compared to 03/23/2014  FINDINGS: Nasogastric tube extends into stomach.  LEFT jugular central venous catheter tip projects over SVC.  RIGHT subclavian sequential pacemaker leads project over RIGHT atrium and RIGHT ventricle.  Enlargement  of cardiac silhouette post CABG.  Pulmonary vascular congestion.  Peribronchial thickening and slight chronic accentuation of interstitial markings likely reflect minimal failure.  No segmental infiltrate, pleural effusion or pneumothorax.  Bones demineralized.  IMPRESSION: Enlargement of cardiac silhouette post CABG and pacemaker.  Question minimal pulmonary edema.   Electronically Signed   By: Ulyses SouthwardMark  Boles M.D.   On: 03/26/2014 14:22   Dg Abd Portable 1v  03/26/2014   CLINICAL DATA:  NG tube placement.  EXAM: PORTABLE ABDOMEN - 1 VIEW  COMPARISON:  03/23/2014  FINDINGS: NG tube is seen, entering the left lung with the tip at the left lung base. Recommend complete removal and replacement.  Nonobstructive bowel gas pattern. Heavily calcified aorta. The right growing central line is in place with the tip in the lower abdomen.  IMPRESSION: NG tube in the left lung with the tip in the left lung base.  Critical Value/emergent results were called by telephone at the time of interpretation on 03/26/2014 at 7:11 pm to patient's nurse Georgeann OppenheimAbla Asatsawo who verbally acknowledged these results.   Electronically Signed   By:  Charlett NoseKevin  Dover M.D.   On: 03/26/2014 19:12   Dg Abd Portable 1v  03/23/2014   CLINICAL DATA:  Encounter for nasogastric tube placement  EXAM: PORTABLE ABDOMEN - 1 VIEW  COMPARISON:  CT abdomen pelvis of Dec 22, 2013  FINDINGS: The tip of the feeding tube is below the hemidiaphragm probably overlying the mid portion of the stomach. The bowel gas pattern is nonspecific. Considerable arterial calcification is present throughout the abdominal aorta and iliac arteries.  IMPRESSION: Tip of NG tube overlies the region of the body of the stomach.   Electronically Signed   By: Dwyane DeePaul  Barry M.D.   On: 03/23/2014 07:49    Medications: Scheduled Meds: . antiseptic oral rinse  7 mL Mouth Rinse BID  . arformoterol  15 mcg Nebulization BID  . budesonide (PULMICORT) nebulizer solution  0.5 mg Nebulization BID  . feeding supplement (ENSURE)  1 Container Oral TID BM  . fentaNYL  12.5 mcg Transdermal Q72H  . levothyroxine  150 mcg Oral QAC breakfast  . megestrol  400 mg Oral BID  . mirtazapine  7.5 mg Oral QHS  . multivitamin with minerals  1 tablet Oral Daily  . olopatadine  1 drop Both Eyes BID  . ondansetron (ZOFRAN) IV  4 mg Intravenous 3 times per day  . pantoprazole (PROTONIX) IV  40 mg Intravenous Q24H  . piperacillin-tazobactam (ZOSYN)  IV  2.25 g Intravenous Q8H  . tiotropium  18 mcg Inhalation Daily   Time 35 min   LOS: 13 days   Macrina Lehnert  DO [facility] 04/04/2014, 7:48 AM Pager: [phone]  If 7PM-7AM, please contact night-coverage [email or web address] Password [email or web address]

## 2014-04-04 NOTE — Progress Notes (Signed)
Physician notified: Dr. Arlean HoppingSchertz and Dr. Benjamine MolaVann At: [phone]  Regarding: FYI pt refusing dialysis today. Family had meeting with palliative at bedside.

## 2014-04-04 NOTE — Progress Notes (Addendum)
Patient ZO:XWRU:[name]      DOB: 09-Apr-1933      EAV:[id number]   [facility]    Subjective: Pain still problematic though using less PRN and at smaller doses.  Refused to complete dialysis session.  Appetite poor.      Filed Vitals:   04/04/14 0804  BP: 142/56  Pulse: 77  Temp: 97.8 F (36.6 C)  Resp: 20   Physical exam: NWG:NFAOZGEN:alert, frail, NAD HEENT: [place], sclera anicteric CV: RRR LUNGS: CTAB ABD: +ostomy Ext: no edema Skin: Warm, dry, scattered ecchymosis  CBC    Component Value Date/Time   WBC 12.5* 04/04/2014 0500   RBC 2.79* 04/04/2014 0500   HGB 9.2* 04/04/2014 0500   HCT 27.8* 04/04/2014 0500   PLT 257 04/04/2014 0500   MCV 99.6 04/04/2014 0500   MCH 33.0 04/04/2014 0500   MCHC 33.1 04/04/2014 0500   RDW 16.2* 04/04/2014 0500   LYMPHSABS 0.9 04/04/2014 0500   MONOABS 0.5 04/04/2014 0500   EOSABS 0.2 04/04/2014 0500   BASOSABS 0.0 04/04/2014 0500    CMP     Component Value Date/Time   NA 130* 04/04/2014 0500   K 4.0 04/04/2014 0500   CL 92* 04/04/2014 0500   CO2 21 04/04/2014 0500   GLUCOSE 121* 04/04/2014 0500   BUN 70* 04/04/2014 0500   CREATININE 4.15* 04/04/2014 0500   CALCIUM 10.1 04/04/2014 0500   PROT 5.4* 04/04/2014 0500   ALBUMIN 1.8* 04/04/2014 0500   AST 19 04/04/2014 0500   ALT 15 04/04/2014 0500   ALKPHOS 138* 04/04/2014 0500   BILITOT 0.3 04/04/2014 0500   GFRNONAA 9* 04/04/2014 0500   GFRAA 11* 04/04/2014 0500     Assessment and plan: 80 yo female with multiple medical problems including ESRD, CHF, Afib, heart block who presented with perforated sigmoid colon s/p surgical resection. Stay complicated by sepsis, poor appetite requiring TPN, resp failure  1. Code Status- DNR  2. Goals of Care- see previous documentation. Talked today about her struggles with dialysis and restorative care.  She acknowledges this is a bad prognostic sign in long term when I discussed my concerns. We even  were able to talk some about stopping dialysis if these treatments are too burdensome to complete, as she will likely do poorly and not get significantly better if she can not complete these treatments and other medical care. She wanted me to talk with family. I did call her daughter and am waiting to hear back. Hopefully we can sit down and talk further today or tomorrow. May take ongoing discussions though.     3. Symptom Management A. Acute on Chronic Pain- Addition of fentanyl patch and decreased bolus dose may have helped some. She still endorses significant pain though. May need to increase patch to 6525mcg/hr.  Hard to assess pain.   B. Poor appetite- on megace. TPN. remeron C. Anxiety- xanax PRN. remeron.   Total Time: 30 minutes  >50% of time spent in counseling and coordination of care regarding above.  Orvis BrillAaron J. Rex Magee D.O. Palliative Medicine Team at [facility]  Pager: [phone] Team Phone: [phone]   Spoke with Maria CurdVera and her 2 daughters today ([name] and [place]).  We discussed difficult situation of not tolerating dialysis sessions with increased pain. Not doing well overall and difficulty tolerating therapies.  Doesn't do well with puree diet but at risk for aspiration with other diet.  She also was previously living at home and is  discouraged by idea of possible nursing home placement. Difficult situation overall.  At times she admits being comfortable with topping dialysis and other times not. Wishes she could just take a break from it.  Daughters aggressively try to convince her to pursue dialysis. I discussed my frank fears that we keep pursuing therapies half-way and she has little recovery, ultimately leading to a situation where we keep doing painful things to her without getting better until she passes away from some complication of her multiple diseases.  My recommendation to Maria CurdVera and family is that we try dialysis for another week, but if we  are unable to make it tolerable for her, we switch focus to comfort and stop dialysis.  They all seemed to agree with this. Daughters do acknowledge that they do not want to keep seeing her suffer like she currently is.    Additional 30 minutes spent in conversation for total time of 60 minutes today  Orvis BrillAaron J. Greene Diodato D.O. Palliative Medicine Team at [facility]  Pager: [phone] Team Phone: [phone]

## 2014-04-04 NOTE — Progress Notes (Signed)
11 Days Post-Op  Subjective: She hurts all the time but mostly with PO intake.  She does not want a CT scan even with the WBC staying up.  She thinks she will let them do dialysis tomorrow.  Objective: Vital signs in last 24 hours: Temp:  [97.6 F (36.4 C)-98.7 F (37.1 C)] 98.4 F (36.9 C) (11/02 0356) Pulse Rate:  [74-77] 75 (11/02 0356) Resp:  [14-24] 24 (11/02 0356) BP: (117-130)/(48-73) 130/73 mmHg (11/02 0356) SpO2:  [99 %-100 %] 100 % (11/02 0356) Weight:  [45.36 kg (100 lb)] 45.36 kg (100 lb) (11/02 0356) Last BM Date: 04/01/14 230 PO recorded 200 ml from colostomy recorded Afebrile, VSS Na 130, K+ 4.0 WBC steady at 12.5 No films TNA/dysphagia I diet  Intake/Output from previous day: 11/01 0701 - 11/02 0700 In: 1479.2 [I.V.:230; IV Piggyback:100; TPN:1149.2] Out: 200 [Stool:200] Intake/Output this shift:    General appearance: alert, cooperative, no distress and sounds terrible coughing up thick sputum that just never quite gets up. Resp: rhonchi bilaterally and cant get sputum up GI: soft sore, hurts all the time.  worse with PO, large colostomy with stool in bag.    Lab Results:   Recent Labs  04/03/14 0149 04/04/14 0500  WBC 12.5* 12.5*  HGB 8.6* 9.2*  HCT 26.6* 27.8*  PLT 171 257    BMET  Recent Labs  04/03/14 0148 04/04/14 0500  NA 132* 130*  K 3.7 4.0  CL 95* 92*  CO2 23 21  GLUCOSE 106* 121*  BUN 47* 70*  CREATININE 3.26* 4.15*  CALCIUM 8.9 10.1   PT/INR No results for input(s): LABPROT, INR in the last 72 hours.   Recent Labs Lab 03/29/14 1130 03/30/14 0420 04/04/14 0500  AST 25 30 19   ALT 20 19 15   ALKPHOS 153* 168* 138*  BILITOT 0.7 0.5 0.3  PROT 5.2* 5.0* 5.4*  ALBUMIN 1.9* 1.8* 1.8*     Lipase  No results found for: LIPASE   Studies/Results: No results found.  Medications: . antiseptic oral rinse  7 mL Mouth Rinse BID  . arformoterol  15 mcg Nebulization BID  . budesonide (PULMICORT) nebulizer solution  0.5 mg  Nebulization BID  . feeding supplement (ENSURE)  1 Container Oral TID BM  . fentaNYL  12.5 mcg Transdermal Q72H  . levothyroxine  150 mcg Oral QAC breakfast  . megestrol  400 mg Oral BID  . mirtazapine  7.5 mg Oral QHS  . multivitamin with minerals  1 tablet Oral Daily  . olopatadine  1 drop Both Eyes BID  . ondansetron (ZOFRAN) IV  4 mg Intravenous 3 times per day  . pantoprazole (PROTONIX) IV  40 mg Intravenous Q24H  . piperacillin-tazobactam (ZOSYN)  IV  2.25 g Intravenous Q8H  . tiotropium  18 mcg Inhalation Daily    Assessment/Plan Acute on chronic respiratory failure ESRD on HD (stopped early 10/31, next planned treatment 04/05/14) Sepsis/shock Chronic CHF/atrial fibrillation/CHB with PTVP/S/p CABG FUNCTIONAL Quadraplegia COPD/onging tobacco use HX of noncompliance Chronic abdominal pain Hypothyroid/hyperparathyroidism Palliative following-goals and family wishes are being discussed. DNR   Plan:  Surgically there is nothing for us to do at this point. She still does not want a CT and we suspect this pain is ischemia.  We will follow with you.  I will see if Respiratory still does chest percussion to help with her respiratory status.    LOS: 13 days    Robbi Spells 04/04/2014

## 2014-04-04 NOTE — Progress Notes (Signed)
PARENTERAL NUTRITION CONSULT NOTE - FOLLOW UP  Pharmacy Consult:  TPN Indication:  Pneumoperitoneum with bowel resection  Allergies  Allergen Reactions  . Ivp Dye [Iodinated Diagnostic Agents] Swelling  . Lisinopril Swelling  . Iohexol Itching and Swelling        Patient Measurements: Height: _0  (157.5 cm) Weight: 100 lb (45.36 kg) IBW/kg (Calculated) : 50.1  Vital Signs: Temp: 97.8 F (36.6 C) (11/02 0804) Temp Source: Axillary (11/02 0804) BP: 142/56 mmHg (11/02 0804) Pulse Rate: 77 (11/02 0804) Intake/Output from previous day: 11/01 0701 - 11/02 0700 In: 1479.2 [I.V.:230; IV Piggyback:100; TPN:1149.2] Out: 200 [Stool:200]  Labs:  Recent Labs  04/03/14 0149 04/04/14 0500  WBC 12.5* 12.5*  HGB 8.6* 9.2*  HCT 26.6* 27.8*  PLT 171 257     Recent Labs  04/02/14 0415 04/03/14 0148 04/04/14 0500  NA 136* 132* 130*  K 4.6 3.7 4.0  CL 97 95* 92*  CO2 _1 GLUCOSE 101* 106* 121*  BUN 62* 47* 70*  CREATININE 4.13* 3.26* 4.15*  CALCIUM 9.4 8.9 10.1  MG  --   --  1.7  PHOS  --   --  3.4  PROT  --   --  5.4*  ALBUMIN  --   --  1.8*  AST  --   --  19  ALT  --   --  15  ALKPHOS  --   --  138*  BILITOT  --   --  0.3  TRIG  --   --  110   Estimated Creatinine Clearance: 7.9 mL/min (by C-G formula based on Cr of 4.15).   No results for input(s): GLUCAP in the last 72 hours.     Insulin Requirements in the past 24 hours:  SSI d/c'd 10/26  Admit: 32 YOF admitted 10/20 after transfer from [facility] with perforated bowel - CT showed free air and thickened small bowel. She was taken to OR 10/20 PM for ex lap, application of open wound VAC and sigmoid colon resection.  GI: hx duodenal ulcers; POD#11 ex lap, sigmoid colon resection. Went back to OR 10/22 for re-exp, colostomy - also found to have several liver nodules suspicious for malignancy which were biopsied. Baseline prealbumin 14.1 (slightly low) and now decreased to 9. NGT now out as patient  displaced during agitation while on HD. Pt c/o sore abdomen. Diet advanced to dysphagia diet however, no PO intake documented and continued poor appetite. Surgery recommended feeding tube for enteral nutrition. TPN previously dc'd however, MD wanting to continue until eating improves.  Megestrol added. CT scan ordered to r/o post-op infection but pt refused. Endo: hx hypothyroidism, TSH 1.6 on admit, continued on home Synthroid . No noted history of DM, no A1C on file. CBGs low-nl since starting TPN; changed IV synthroid to PO Lytes: none in TPN - low Na/CL, CoCa elevated at 11.86 Renal: ESRD on HD MWF with extra HD on 10/24 due to TPN. Renal started CRRT 10/21 PM, then stopped 10/23. Pt wants continued treatment but either refusing HD or requesting to discontinue HD early. Renal ok'd increasing TPN rate to 14m/hr. Cards: CAD s/p CABG in 2008, HF with EF 25-30%, afib - VSS Hepatobil: LFTs remain WNL except alk phos. Albumin low at 1.8. Baseline TG 199, now 100 - remains anemic but thrombocytopenia resolved, no overt bleeding noted ID: Zosyn D#[id number] for bowel perf. Blood and urine cultures NGTD, trach aspirate with few Candida - Afebrile, WBC 12.5 Pulm: 2L Happy -  Brovana, Pulmicort, Spiriva Best Practices: SCDs, MC TPN Access: CVC double lumen left IJ placed 10/20 TPN day#: 12 (10/21 >> )  Current Nutrition:  TPN Ensure (received 3 yesterday) Dysphagia diet - no intake documented   Nutritional Goals:  1500-1700 kCal, 75-85 grams of protein per day per RD recommendations 10/29   Plan: - Continue Clinimix 5/15 (NO ELECTROLYTES) at 39m/hr (max per renal) which provides 60gm protein/day (80% goal) and 852 kCal/day. Unfortunately, we are unable to concentrate the bags since they are pre-mixed so we are unable to meet nutrition needs with parenteral nutrition due to fluid limitations.  Concern that Ensure alone will not meet patient's fatty acids need; therefore, will give lipids every Monday.  TPN will  provide a daily average per week of 914 kCal and 60gm protein, meeting 61% of minimum kCal and 80% of minimal protein needs. - Maintenance fluids per MD - No multivitamin nor trace elements in TPN as patient is on PO multivitamin - F/u long-term nutrition plan - TPN will not meet her needs unless increased fluid can be tolerated - Mag sulfate 1gm IV x 1 - F/u daily     [name] D. DMina Marble PharmD, BCPS Pager:  [phone]/07/2013, 10:50 AM

## 2014-04-04 NOTE — Progress Notes (Signed)
PT Cancellation Note  Patient Details Name: [name] MRN: [id number] DOB: 08-05-1932   Cancelled Treatment:    Reason Eval/Treat Not Completed: Other (comment), pt and family currently in palliative meeting with physician, will check back as time allows.   Baron Parmelee, [place] 04/04/2014, 1:49 PM

## 2014-04-04 NOTE — Progress Notes (Signed)
UR completed 

## 2014-04-04 NOTE — Progress Notes (Signed)
Physician notified: Benjamine MolaVann At: [phone]  Regarding: Requesting afrin/nasal saline spray for nasal congesetion. Fam finished withhad pall mtg. Thanks  order placed in CPOE.

## 2014-04-04 NOTE — Progress Notes (Signed)
   04/04/14 1600  Clinical Encounter Type  Visited With Patient  Visit Type Follow-up  Referral From Nurse   Chaplain was paged to follow up with patient. Patient had a question regarding the living will document and chaplain helped provide an answer. Chaplain assisted in getting the "natural death" legal document that the patient's family had drawn up by a lawyer completed and notarized. Patient and patient's family were also interested in completing a durable power of attorney. This document could not be completed due to the patient not having identification with her. Chaplain will follow up with patient as needed. Cranston NeighborStrother, Ammanda Dobbins R, Chaplain  4:50 PM

## 2014-04-04 NOTE — Progress Notes (Signed)
Physical Therapy Treatment Patient Details Name: [name] MRN: [id number] DOB: May 17, 1933 Today's Date: 04/04/2014    History of Present Illness Pt admitted with pneumoperitoneum.  S/P removal of necrotic bowel 10/20 and fascial closure 10/22.  Pt with liver biopsy results pending.  PMH:  COPD, severe sepsis and septic shock from with perontenitis, CHF, afib, ESRD.    PT Comments    Pt with limited mobility. Agreeable due to ready to get back in bed.  Follow Up Recommendations  SNF     Equipment Recommendations  None recommended by PT    Recommendations for Other Services       Precautions / Restrictions Precautions Precautions: Fall    Mobility  Bed Mobility Overal bed mobility: Needs Assistance Bed Mobility: Sit to Supine       Sit to supine: Mod assist   General bed mobility comments: Pt with uncontrolled descent of trunk and assist to bring legs up.  Transfers Overall transfer level: Needs assistance Equipment used: 2 person hand held assist Transfers: Sit to/from BJ'sStand;Stand Pivot Transfers Sit to Stand: +2 physical assistance;Min assist Stand pivot transfers: +2 physical assistance;Min assist       General transfer comment: Assist to bring hips up and for balance  Ambulation/Gait Ambulation/Gait assistance: +2 physical assistance;Min assist Ambulation Distance (Feet): 2 Feet Assistive device: 2 person hand held assist Gait Pattern/deviations: Step-to pattern;Decreased step length - right;Decreased step length - left;Shuffle;Trunk flexed   Gait velocity interpretation: Below normal speed for age/gender General Gait Details: Pt took pivotal steps back to bed from recliner.   Stairs            Wheelchair Mobility    Modified Rankin (Stroke Patients Only)       Balance Overall balance assessment: Needs assistance Sitting-balance support: No upper extremity supported;Feet supported Sitting balance-Leahy Scale: Fair     Standing  balance support: Bilateral upper extremity supported Standing balance-Leahy Scale: Poor                      Cognition Arousal/Alertness: Awake/alert Behavior During Therapy: Flat affect Overall Cognitive Status: Within Functional Limits for tasks assessed                      Exercises      General Comments        Pertinent Vitals/Pain Pain Assessment: 0-10 Pain Score: 10-Worst pain ever Pain Location: "belly" Pain Intervention(s): Patient requesting pain meds-RN notified;Repositioned    Home Living                      Prior Function            PT Goals (current goals can now be found in the care plan section) Progress towards PT goals: Not progressing toward goals - comment    Frequency  Min 2X/week    PT Plan Current plan remains appropriate;Frequency needs to be updated    Co-evaluation             End of Session   Activity Tolerance: Patient limited by fatigue Patient left: in bed;with call bell/phone within reach     Time: 1535-1546 PT Time Calculation (min): 11 min  Charges:                       G Codes:      Skyla Champagne 04/04/2014, 4:17 PM  [facility] Brazen Domangue PT [phone]

## 2014-04-04 NOTE — Progress Notes (Signed)
   04/04/14 1400  Clinical Encounter Type  Visited With Patient and family together;Health care provider  Visit Type Follow-up  Referral From Nurse   Chaplain was paged to patient's room 1:24PM. Patient's family member explained that they are in the process of redoing an advanced directive for the patient. The family member handed chaplain the documents. Chaplain looked over the documents and pointed out a few sections that still needed to be fill out before the document can be signed and notarized. Page Merrilyn Puman-Call Chaplain when family is ready for document to be signed again. [place] NeighborStrother, Duante Arocho R, Chaplain  2:07 PM

## 2014-04-04 NOTE — Progress Notes (Signed)
Speech Language Pathology Treatment: Dysphagia  Patient Details Name: [name] MRN: [id number] DOB: July 24, 1932 Today's Date: 04/04/2014 Time: [id number] SLP Time Calculation (min): 20 min  Assessment / Plan / Recommendation Clinical Impression  Pt resting in bed, finishing treatment with respiratory. Per RN, pt has had minimal po intake due to refusal, and has had intermittent confusion. Pt agreeable to taking a few bites of magic cup. Daughter fed pt 1/2 tsp boluses at a slow rate. No overt s/s aspiration observed at this time. Intake minimal. ST to continue to follow for assessment of diet tolerance.   HPI HPI: 80 y.o. female with history of CAD s/p CABG, systolic CHF, COPD, tobacco use, ESRD presenting with pneumoperitoneum.  Intubated 10/21-10/23;  10/20 s/p ex lap with colon resection and subsequent liver biopsy and creation of colostomy with abdominal closure. Acute renal failure on CKD , on CRT, continues to improve. Septic shock, improving.  Currently taking minimal po, also with TPN.   Pertinent Vitals Pain Assessment: 0-10 Pain Score: 10-Worst pain ever Pain Location: "belly" Pain Intervention(s): Patient requesting pain meds-RN notified;Repositioned  SLP Plan  Continue with current plan of care    Recommendations Diet recommendations: Dysphagia 1 (puree);Pudding-thick liquid Liquids provided via: Teaspoon Medication Administration: Whole meds with puree Supervision: Patient able to self feed;Full supervision/cueing for compensatory strategies Compensations: Slow rate;Small sips/bites Postural Changes and/or Swallow Maneuvers: Upright 30-60 min after meal (seated as upright as pt will tolerate)              Oral Care Recommendations: Oral care BID Follow up Recommendations: Skilled Nursing facility Plan: Continue with current plan of care    GO   Falana Clagg B. Murvin NatalBueche, [facility] Surgical CenterMSP, CCC-SLP [phone]  Leigh AuroraBueche, Leslee Haueter Brown 04/04/2014, 1:07 PM

## 2014-04-04 NOTE — Progress Notes (Signed)
[facility] Progress Note   Subjective: Wants to get up and sit on the side of the bed Staff says she has been confused Slight cough, nonprod  Filed Vitals:   04/03/14 1957 04/04/14 0000 04/04/14 0356 04/04/14 0852  BP:  125/51 130/73   Pulse:  77 75   Temp:  98.2 F (36.8 C) 98.4 F (36.9 C)   TempSrc:  Oral Oral   Resp:  14 24   Height:      Weight:   45.36 kg (100 lb)   SpO2: 100% 99% 100% 99%   Exam: Gen: frail, ill weak emaciated, rattling cough Heart: RRR Lungs: coarse BS bilat, dec'd BS at bases Abdomen: colostomy green stool, midline dressing clean Ext: no sig edema Access: left upper AVF + bruit  HD: MWF [facility]  3.5h 44.5kg 2/2.25 Bath 350/800 Heparin none Profile 2  No meds      Assessment: 1 Ruptured sigmoid colon, s/p partial colectomy/colostomy - on Zosyn, afeb 2 ESRD on HD - continues to refuse full HD treatments, tolerating HD poorly 3 Anemia on aranesp 25 ug /wk started here 4 HPTH no meds, labs in range 5 HTN / volume - BP's normal, at dry wt, on TNA 6 Nutrition continues to require TNA 7 Severe debility - may need LTC if doesn't improve  Plan- HD today , get back on schedule, remove vol as tolerated, lower dry wt    Vinson Moselleob Eulis Salazar MD  pager [phone]    cell [phone]  04/04/2014, 8:58 AM     Recent Labs Lab 03/29/14 1130 03/30/14 0420  04/02/14 0415 04/03/14 0148 04/04/14 0500  NA 140 140  < > 136* 132* 130*  K 3.7 3.6*  < > 4.6 3.7 4.0  CL 99 99  < > 97 95* 92*  CO2 27 26  < > 23 23 21   GLUCOSE 122* 105*  < > 101* 106* 121*  BUN 33* 45*  < > 62* 47* 70*  CREATININE 2.16* 2.86*  < > 4.13* 3.26* 4.15*  CALCIUM 8.2* 8.3*  < > 9.4 8.9 10.1  PHOS 1.9* 2.3  --   --   --  3.4  < > = values in this interval not displayed.  Recent Labs Lab 03/29/14 1130 03/30/14 0420 04/04/14 0500  AST 25 30 19   ALT 20 19 15   ALKPHOS 153* 168* 138*  BILITOT 0.7 0.5 0.3  PROT 5.2* 5.0* 5.4*  ALBUMIN 1.9* 1.8* 1.8*    Recent  Labs Lab 03/29/14 1130 03/30/14 0420 04/01/14 0319 04/03/14 0149 04/04/14 0500  WBC 8.6 9.8 12.8* 12.5* 12.5*  NEUTROABS 6.9 8.4*  --   --  10.8*  HGB 9.7* 9.4* 9.3* 8.6* 9.2*  HCT 30.0* 28.9* 28.2* 26.6* 27.8*  MCV 102.7* 100.0 100.7* 101.5* 99.6  PLT 70* 86* 135* 171 257   . antiseptic oral rinse  7 mL Mouth Rinse BID  . arformoterol  15 mcg Nebulization BID  . budesonide (PULMICORT) nebulizer solution  0.5 mg Nebulization BID  . feeding supplement (ENSURE)  1 Container Oral TID BM  . fentaNYL  12.5 mcg Transdermal Q72H  . levothyroxine  150 mcg Oral QAC breakfast  . megestrol  400 mg Oral BID  . mirtazapine  7.5 mg Oral QHS  . multivitamin with minerals  1 tablet Oral Daily  . olopatadine  1 drop Both Eyes BID  . ondansetron (ZOFRAN) IV  4 mg Intravenous 3 times per day  . pantoprazole (PROTONIX) IV  40 mg Intravenous Q24H  . piperacillin-tazobactam (ZOSYN)  IV  2.25 g Intravenous Q8H  . tiotropium  18 mcg Inhalation Daily   . sodium chloride 10 mL/hr at 04/01/14 0700  . TPN (CLINIMIX) Adult without lytes 50 mL/hr at 04/03/14 1900   sodium chloride, sodium chloride, sodium chloride, sodium chloride, albuterol, ALPRAZolam, feeding supplement (NEPRO CARB STEADY), fentaNYL, heparin, lidocaine (PF), lidocaine-prilocaine, pentafluoroprop-tetrafluoroeth, RESOURCE THICKENUP CLEAR, sodium chloride, witch hazel-glycerin

## 2014-04-05 LAB — PREALBUMIN: Prealbumin: 11.8 mg/dL — ABNORMAL LOW (ref 17.0–34.0)

## 2014-04-05 MED ORDER — FENTANYL CITRATE 0.05 MG/ML IJ SOLN
12.5000 ug | INTRAMUSCULAR | Status: DC | PRN
Start: 2014-04-05 — End: 2014-04-08
  Administered 2014-04-05 – 2014-04-07 (×9): 25 ug via INTRAVENOUS
  Filled 2014-04-05 (×9): qty 2

## 2014-04-05 MED ORDER — CLINIMIX/DEXTROSE (5/15) 5 % IV SOLN
INTRAVENOUS | Status: DC
  Administered 2014-04-05: 17:00:00 via INTRAVENOUS
  Filled 2014-04-05: qty 2000

## 2014-04-05 NOTE — Progress Notes (Signed)
Patient ID: Maria LefevreVera M [name]  female  HQI:[id number]    DOB: Sep 27, 1932    DOA: 05-18-14  PCP: Galvin ProfferHAGUE, IMRAN P, MD   Admit HPI / Brief Narrative:  80 y.o. female with history of CAD s/p CABG, systolic CHF, COPD, tobacco use, and ESRD who presented with AMS and dyspnea and was found to have a pneumoperitoneum. Difficult extubation was expected postoperatively and PCCM was consulted.  Significant Events:  10/20 Ex lap and removal of necrotic bowel; on neo  10/21 On levophed and neo  10/22 Back to OR, fascial closure. Finding of multiple liver nodules not previously noted. Liver biopsy obtained- benign 10/22 Family conference. Pt made DNR in event of cardiac arrest.  10/23 Off vasopressors, extubated with adequate resp status post extubaiton  10/24 Stable for tx to SDU  10/25 [facility] assumed care  Since the hospitalist have assume the patient's care the primary focus has been advancement of her diet/nutritional status. Patient has been refusing full dialysis, refusing to eat, refusing CT scan   Assessment/Plan:  Acute on chronic respiratory failure, post operative VDRF : -coarse breath sounds, patient has history of severe COPD, ongoing tobacco abuse, extubated on 10/23, not completing hemodialysis - chest x-ray 10/29 showed increasing bilateral atelectasis, placed on Zosyn,  -[facility] - Cont nebulized steroids and BD's, ordered flutter valve and incentive spirometry, O2, deep suctioning  - Needs hemodialysis for volume management, however patient has not been completing or refusing (see nephrology notes)  Severe sepsis and septic shock, secondary to peritonitis  -  improved, respiratory culture had shown few Candida albicans otherwise blood cultures, urine cultures all negative, patient had received Zosyn for 7 days, finished on 10/27.  -zosyn restarted 10/30  Perforated sigmoid colon due to Intestinal ischemia  - Post laparotomy, sigmoid colectomy and liver biopsy, creation of colostomy with  abdominal closure  - On TPN, patient needs to start eating slowly, on dysphagia 1 diet however she is not eating - Needs PT, OT, increase ambulation - Surgery following closely, Wet-dressing bid to lower midline incision  - We will continue TPN until patient establishes that she is tolerating diet and eating - Placed on fentanyl for pain control, palliative medicine following closely  ESRD (HD on MWF)  - per Nephrology, discussed with Dr. Marisue HumbleSanford today, difficult situation. As outpatient also, she has frequently terminated her hemodialysis early. Patient had refused hemodialysis on 10/28, went for hemodialysis on 10/29 but stopped early due to pain and cramping. She is volume overloaded. Palliative medicine consult was called, 10/29, per goals of care, patient and family requested to continue with hemodialysis and better pain control. Patient continues to refuse to complete dialysis -continues to refuse dialysis  Chronic systolic CHF, ESRD - HD for volume control   History of atrial fibrillation  - rate controlled, in NSR   History of complete heart block with pacemaker  CAD s/p 3v CABG  -No chest pain    Liver nodules  - biopsy results indicate benign process  - colon pathology negative for malignancy   Anemia of CKD  - no signs of active bleeding  - follow CBC   Thrombocytopenia  - SCD's for DVT prophylaxis   Hypothyroidism  - Continue home Synthroid dose   Severe PCM  - secondary to acute illness with complications outlined above - patient states she is not interested in having an NG tube placed  -not eating well -TPN  Acute functional quadriplegia  - secondary to acute illness as noted above  -  PT/OT evaluation pending  Hypokalemia-replaced  Leukocytosis -stable at 12.5  DVT Prophylaxis: SCDs  Code Status: DO NOT RESUSCITATE  Family Communication: daughter on phone 11/1  Disposition: as patient is refusing dialysis, will not send to [facility]- needs to be made  comfort care- family working with palliative care  Consultants:  Nephrology  Gen. Surgery  Ccm   Palliative care  Procedures: Ex lap and removal of necrotic bowel   Antibiotics:  zosyn   Subjective: Refused dialysis yesterday- says she understands she will die without dialysis  Objective: Weight change:   Intake/Output Summary (Last 24 hours) at 04/05/14 0813 Last data filed at 04/05/14 0600  Gross per 24 hour  Intake   1725 ml  Output    325 ml  Net   1400 ml   Blood pressure 147/61, pulse 76, temperature 98.5 F (36.9 C), temperature source Axillary, resp. rate 12, height 5\' 2"  (1.575 m), weight 45.36 kg (100 lb), SpO2 100 %.  Physical Exam: General: Alert and awake, oriented to place, not time CVS: S1-S2 clear, no murmur rubs or gallops Chest: diffuse rhonchi  bilaterally  Abdomen: tender, midline incision, dressing intact  Extremities: no cyanosis, clubbing or edema noted bilaterally   Lab Results: Basic Metabolic Panel:  Recent Labs Lab 04/03/14 0148 04/04/14 0500  NA 132* 130*  K 3.7 4.0  CL 95* 92*  CO2 23 21  GLUCOSE 106* 121*  BUN 47* 70*  CREATININE 3.26* 4.15*  CALCIUM 8.9 10.1  MG  --  1.7  PHOS  --  3.4   Liver Function Tests:  Recent Labs Lab 03/30/14 0420 04/04/14 0500  AST 30 19  ALT 19 15  ALKPHOS 168* 138*  BILITOT 0.5 0.3  PROT 5.0* 5.4*  ALBUMIN 1.8* 1.8*   No results for input(s): LIPASE, AMYLASE in the last 168 hours. No results for input(s): AMMONIA in the last 168 hours. CBC:  Recent Labs Lab 04/03/14 0149 04/04/14 0500  WBC 12.5* 12.5*  NEUTROABS  --  10.8*  HGB 8.6* 9.2*  HCT 26.6* 27.8*  MCV 101.5* 99.6  PLT 171 257   Cardiac Enzymes: No results for input(s): CKTOTAL, CKMB, CKMBINDEX, TROPONINI in the last 168 hours. BNP: Invalid input(s): POCBNP CBG:  Recent Labs Lab 03/29/14 1636 03/29/14 2351 03/30/14 0948  GLUCAP 124* 106* 100*     Micro Results: No results found for this or any  previous visit (from the past 240 hour(s)).  Studies/Results: Dg Chest 2 View  03/15/2014   CLINICAL DATA:  COUGH ABDOMINAL PAIN cough.  Shortness of breath.  EXAM: CHEST  2 VIEW  COMPARISON:  CT abdomen 01/31/2014. Multiple chest radiographs dating back to 06/07/2013.  FINDINGS: Cardiomegaly. Median sternotomy/ CABG. Pulmonary vascular congestion is present. Diffuse interstitial prominence likely rib relates to chronic congestive heart failure. Bilateral pleural apical thickening. Thoracic compression fractures present. Coronary artery stents. Two lead RIGHT subclavian cardiac pacemaker leads are unchanged. Calcified av fistula is present in the LEFT arm.  IMPRESSION: Chronic cardiomegaly and pulmonary vascular congestion. No interval change or acute abnormality.   Electronically Signed   By: Andreas Newport M.D.   On: 03/15/2014 18:47   Dg Chest Port 1 View  03/23/2014   CLINICAL DATA:  Check central line placement  EXAM: PORTABLE CHEST - 1 VIEW  COMPARISON:  03/23/2014 1814 hrs  FINDINGS: Cardiac shadow is stable. Postsurgical changes are again seen. A pacing device is again noted and stable. The endotracheal tube is noted 4.4 cm above the  carina. A nasogastric catheter courses towards the stomach. The left central venous line has been advanced and now lies with the tip in the proximal superior vena cava. No pneumothorax is noted. Bibasilar infiltrates are again identified.  IMPRESSION: Tubes and lines as described.  Stable bibasilar changes.   Electronically Signed   By: Alcide Clever M.D.   On: 03/23/2014 20:25   Dg Chest Port 1 View  03/23/2014   CLINICAL DATA:  Left central line placement.  EXAM: PORTABLE CHEST - 1 VIEW  COMPARISON:  03/23/2014 at 24:49 a.m.  FINDINGS: Endotracheal tube tip is indistinct but believed to be about 4.4 cm above the carina. Nasogastric tube enters the stomach. Left IJ line appears to of retracted slightly and now projects over the bottom of the aortic arch. This is  probably in the innominate vein.  Prior CABG. Indistinct pulmonary vasculature. Stable low level airspace opacity in the right infrahilar region and retrocardiac region. coronary stent.  IMPRESSION: 1. Left IJ line has retracted somewhat, with tip projecting over the lower margin of the thoracic aorta, but likely in a venous structure given prior positioning. One could perform lateral view or chest CT to confirm line positioning. 2. Stable mild bibasilar airspace opacities. Interstitial accentuation in the upper lung zones may reflect mild pulmonary venous hypertension.   Electronically Signed   By: Herbie Baltimore M.D.   On: 03/23/2014 18:44   Dg Chest Port 1 View  03/23/2014   CLINICAL DATA:  Evaluate endotracheal tube placement  EXAM: PORTABLE CHEST - 1 VIEW  COMPARISON:  Chest x-ray of 03/15/2014  FINDINGS: The tip of the endotracheal tube is approximately 2.9 cm above the carina. Cardiomegaly and minimal pulmonary vascular congestion remains. Left central venous line is present with the tip seen to the junction of the left innominate vein with the SVC. Pacemaker remains.  IMPRESSION: 1. Endotracheal tube 2.9 cm above the carina. 2. Left IJ central venous line tip near the junction of the left innominate vein and SVC. 3. Stable cardiomegaly with minimal congestion endotracheal tube placement   Electronically Signed   By: Dwyane Dee M.D.   On: 03/23/2014 07:52   Dg Chest Port 1v Same Day  03/31/2014   CLINICAL DATA:  Dyspnea and aspiration pneumonia  EXAM: PORTABLE CHEST - 1 VIEW SAME DAY  COMPARISON:  03/26/2014  FINDINGS: Cardiac shadow is within normal limits. Postsurgical changes are again noted. A pacing device is stable. The left central venous line appears to have been withdrawn in the interval. The tip now lies over the aortic knob. Clinical correlation is recommended. The lungs are well aerated bilaterally. Increased left retrocardiac atelectasis and right basilar atelectasis is noted.   IMPRESSION: Increasing bibasilar atelectasis.  Interval withdrawal of the left central venous line now lying over the aortic knob. Clinical correlation is recommended.   Electronically Signed   By: Alcide Clever M.D.   On: 03/31/2014 08:02   Dg Chest Port 1v Same Day  03/26/2014   CLINICAL DATA:  Pulmonary edema, history of CHF, coronary artery disease post CABG, COPD, hypertension, end-stage renal disease on dialysis, atrial fibrillation  EXAM: PORTABLE CHEST - 1 VIEW SAME DAY  COMPARISON:  Portable exam 1213 hr compared to 03/23/2014  FINDINGS: Nasogastric tube extends into stomach.  LEFT jugular central venous catheter tip projects over SVC.  RIGHT subclavian sequential pacemaker leads project over RIGHT atrium and RIGHT ventricle.  Enlargement of cardiac silhouette post CABG.  Pulmonary vascular congestion.  Peribronchial thickening and  slight chronic accentuation of interstitial markings likely reflect minimal failure.  No segmental infiltrate, pleural effusion or pneumothorax.  Bones demineralized.  IMPRESSION: Enlargement of cardiac silhouette post CABG and pacemaker.  Question minimal pulmonary edema.   Electronically Signed   By: Ulyses SouthwardMark  Boles M.D.   On: 03/26/2014 14:22   Dg Abd Portable 1v  03/26/2014   CLINICAL DATA:  NG tube placement.  EXAM: PORTABLE ABDOMEN - 1 VIEW  COMPARISON:  03/23/2014  FINDINGS: NG tube is seen, entering the left lung with the tip at the left lung base. Recommend complete removal and replacement.  Nonobstructive bowel gas pattern. Heavily calcified aorta. The right growing central line is in place with the tip in the lower abdomen.  IMPRESSION: NG tube in the left lung with the tip in the left lung base.  Critical Value/emergent results were called by telephone at the time of interpretation on 03/26/2014 at 7:11 pm to patient's nurse Georgeann OppenheimAbla Asatsawo who verbally acknowledged these results.   Electronically Signed   By: Charlett NoseKevin  Dover M.D.   On: 03/26/2014 19:12   Dg Abd  Portable 1v  03/23/2014   CLINICAL DATA:  Encounter for nasogastric tube placement  EXAM: PORTABLE ABDOMEN - 1 VIEW  COMPARISON:  CT abdomen pelvis of 03/31/2014  FINDINGS: The tip of the feeding tube is below the hemidiaphragm probably overlying the mid portion of the stomach. The bowel gas pattern is nonspecific. Considerable arterial calcification is present throughout the abdominal aorta and iliac arteries.  IMPRESSION: Tip of NG tube overlies the region of the body of the stomach.   Electronically Signed   By: Dwyane DeePaul  Barry M.D.   On: 03/23/2014 07:49    Medications: Scheduled Meds: . antiseptic oral rinse  7 mL Mouth Rinse BID  . arformoterol  15 mcg Nebulization BID  . budesonide (PULMICORT) nebulizer solution  0.5 mg Nebulization BID  . feeding supplement (ENSURE)  1 Container Oral TID BM  . fentaNYL  12.5 mcg Transdermal Q72H  . levothyroxine  150 mcg Oral QAC breakfast  . megestrol  400 mg Oral BID  . mirtazapine  7.5 mg Oral QHS  . multivitamin with minerals  1 tablet Oral Daily  . olopatadine  1 drop Both Eyes BID  . ondansetron (ZOFRAN) IV  4 mg Intravenous 3 times per day  . oxymetazoline  1 spray Each Nare BID  . pantoprazole (PROTONIX) IV  40 mg Intravenous Q24H  . piperacillin-tazobactam (ZOSYN)  IV  2.25 g Intravenous Q8H  . tiotropium  18 mcg Inhalation Daily   Time 35 min   LOS: 14 days   Yanitza Shvartsman  DO [facility] 04/05/2014, 8:13 AM Pager: [phone]  If 7PM-7AM, please contact night-coverage [email or web address] Password [email or web address]

## 2014-04-05 NOTE — Progress Notes (Signed)
[facility] Progress Note   Subjective: NO complaints. Refused HD yesterday.  Filed Vitals:   04/05/14 0400 04/05/14 0735 04/05/14 0740 04/05/14 0841  BP:   132/58   Pulse: 76  75   Temp:  97.6 F (36.4 C)    TempSrc:  Oral    Resp: 12  19   Height:      Weight:      SpO2: 100%  99% 100%   Exam: Gen: frail, ill weak emaciated, rattling cough Heart: RRR Lungs: coarse wheezing is better, bibasilar rhonchi Abdomen: colostomy green stool, midline dressing clean Ext: no sig edema Access: left upper AVF + bruit  HD: MWF [place]  3.5h 44.5kg 2/2.25 Bath 350/800 Heparin none Profile 2  No meds      Assessment: 1 Ruptured sigmoid colon, s/p partial colectomy/colostomy - on Zosyn, afeb 2 ESRD on HD - refused dialysis yesterday 3 Anemia on aranesp 25 ug /wk started here 4 HPTH no meds, labs in range 5 HTN / volume - BP's normal, at dry wt, on TNA 6 Nutrition continues to require TNA 7 Severe debility  Plan- refused HD yesterday.  I have written HD orders for tomorrow, not sure if she will consent to go.  Palliative care working with family. Would support withdrawal of HD of dialysis if that is pt's wish.      Vinson Moselleob Nohemy Koop MD  pager [phone]    cell [phone]  04/05/2014, 9:48 AM     Recent Labs Lab 03/29/14 1130 03/30/14 0420  04/02/14 0415 04/03/14 0148 04/04/14 0500  NA 140 140  < > 136* 132* 130*  K 3.7 3.6*  < > 4.6 3.7 4.0  CL 99 99  < > 97 95* 92*  CO2 27 26  < > 23 23 21   GLUCOSE 122* 105*  < > 101* 106* 121*  BUN 33* 45*  < > 62* 47* 70*  CREATININE 2.16* 2.86*  < > 4.13* 3.26* 4.15*  CALCIUM 8.2* 8.3*  < > 9.4 8.9 10.1  PHOS 1.9* 2.3  --   --   --  3.4  < > = values in this interval not displayed.  Recent Labs Lab 03/29/14 1130 03/30/14 0420 04/04/14 0500  AST 25 30 19   ALT 20 19 15   ALKPHOS 153* 168* 138*  BILITOT 0.7 0.5 0.3  PROT 5.2* 5.0* 5.4*  ALBUMIN 1.9* 1.8* 1.8*    Recent Labs Lab 03/29/14 1130 03/30/14 0420  04/01/14 0319 04/03/14 0149 04/04/14 0500  WBC 8.6 9.8 12.8* 12.5* 12.5*  NEUTROABS 6.9 8.4*  --   --  10.8*  HGB 9.7* 9.4* 9.3* 8.6* 9.2*  HCT 30.0* 28.9* 28.2* 26.6* 27.8*  MCV 102.7* 100.0 100.7* 101.5* 99.6  PLT 70* 86* 135* 171 257   . antiseptic oral rinse  7 mL Mouth Rinse BID  . arformoterol  15 mcg Nebulization BID  . budesonide (PULMICORT) nebulizer solution  0.5 mg Nebulization BID  . feeding supplement (ENSURE)  1 Container Oral TID BM  . fentaNYL  12.5 mcg Transdermal Q72H  . levothyroxine  150 mcg Oral QAC breakfast  . megestrol  400 mg Oral BID  . mirtazapine  7.5 mg Oral QHS  . multivitamin with minerals  1 tablet Oral Daily  . olopatadine  1 drop Both Eyes BID  . ondansetron (ZOFRAN) IV  4 mg Intravenous 3 times per day  . oxymetazoline  1 spray Each Nare BID  . pantoprazole (PROTONIX) IV  40 mg  Intravenous Q24H  . piperacillin-tazobactam (ZOSYN)  IV  2.25 g Intravenous Q8H  . tiotropium  18 mcg Inhalation Daily   . sodium chloride 10 mL/hr at 04/01/14 0700  . TPN (CLINIMIX) Adult without lytes 50 mL/hr at 04/05/14 0900   And  . fat emulsion 250 mL (04/05/14 0900)  . TPN (CLINIMIX) Adult without lytes     sodium chloride, sodium chloride, sodium chloride, sodium chloride, albuterol, ALPRAZolam, feeding supplement (NEPRO CARB STEADY), fentaNYL, heparin, lidocaine (PF), lidocaine-prilocaine, pentafluoroprop-tetrafluoroeth, RESOURCE THICKENUP CLEAR, sodium chloride, witch hazel-glycerin

## 2014-04-05 NOTE — Progress Notes (Signed)
Patient MV:HQIO:[name]      DOB: 1933/01/20      NGE:[id number]   [facility]    Subjective: Reports pain better today.  Refused HD yesterday. No nausea. Still little to no appetite.  Appears slightly more confused today. States she recalls conversation from yesterday, but not able to verbalize much detail.     Filed Vitals:   04/05/14 0740  BP: 132/58  Pulse: 75  Temp:   Resp: 19   Physical exam: GEN: alert, appears mildly confused HEENT: [place], sclera anicteric CV: RRR LUNGS: CTAB ABD: soft, abdominal wound dressed. Tenderness less.  EXT: no edema Skin: warm/dry  CBC    Component Value Date/Time   WBC 12.5* 04/04/2014 0500   RBC 2.79* 04/04/2014 0500   HGB 9.2* 04/04/2014 0500   HCT 27.8* 04/04/2014 0500   PLT 257 04/04/2014 0500   MCV 99.6 04/04/2014 0500   MCH 33.0 04/04/2014 0500   MCHC 33.1 04/04/2014 0500   RDW 16.2* 04/04/2014 0500   LYMPHSABS 0.9 04/04/2014 0500   MONOABS 0.5 04/04/2014 0500   EOSABS 0.2 04/04/2014 0500   BASOSABS 0.0 04/04/2014 0500    CMP     Component Value Date/Time   NA 130* 04/04/2014 0500   K 4.0 04/04/2014 0500   CL 92* 04/04/2014 0500   CO2 21 04/04/2014 0500   GLUCOSE 121* 04/04/2014 0500   BUN 70* 04/04/2014 0500   CREATININE 4.15* 04/04/2014 0500   CALCIUM 10.1 04/04/2014 0500   PROT 5.4* 04/04/2014 0500   ALBUMIN 1.8* 04/04/2014 0500   AST 19 04/04/2014 0500   ALT 15 04/04/2014 0500   ALKPHOS 138* 04/04/2014 0500   BILITOT 0.3 04/04/2014 0500   GFRNONAA 9* 04/04/2014 0500   GFRAA 11* 04/04/2014 0500     Assessment and plan: 80 yo female with multiple medical problems including ESRD, CHF, Afib, heart block who presented with perforated sigmoid colon s/p surgical resection. Stay complicated by sepsis, poor appetite requiring TPN, resp failure  1. Code Status- DNR  2. Goals of Care- see previous documentation. After discussion yesterday, it seems pt/family agree on time  limited trial of dialysis.  Struggle between patient family.  Patient displays some indifference to dialysis, family frustrated at her compliance and aggressive in trying to support dialysis for her.  They seemed to agree that if she is not able to complete dialysis sessions by end of week despite our best efforts, than switching focus to comfort makes sense.  Our team will continue to follow along. She did fill out HCPOA paperwork yesterday with Chaplain.   3. Symptom Management A. Acute on Chronic Pain- Much less pain med use since starting fentanyl patch.  Some of this may be dialysis not being done vs time away from surgery. I will make PRN range of 12.5-25mcg to see if we can minimize sedation.  B. Poor appetite- on megace. TPN. remeron C. Anxiety- xanax PRN. remeron.   Orvis BrillAaron J. Ousmane Seeman D.O. Palliative Medicine Team at [facility]  Pager: [phone] Team Phone: [phone]

## 2014-04-05 NOTE — Progress Notes (Signed)
PARENTERAL NUTRITION CONSULT NOTE - FOLLOW UP  Pharmacy Consult:  TPN Indication:  Pneumoperitoneum with bowel resection  Allergies  Allergen Reactions  . Ivp Dye [Iodinated Diagnostic Agents] Swelling  . Lisinopril Swelling  . Iohexol Itching and Swelling        Patient Measurements: Height: 5\' 2"  (157.5 cm) Weight: 100 lb (45.36 kg) IBW/kg (Calculated) : 50.1  Vital Signs: Temp: 97.6 F (36.4 C) (11/03 0735) Temp Source: Oral (11/03 0735) BP: 147/61 mmHg (11/03 0249) Pulse Rate: 76 (11/03 0400) Intake/Output from previous day: 11/02 0701 - 11/03 0700 In: 1805 [P.O.:50; I.V.:229.2; IV Piggyback:200; TPN:1325.8] Out: 325 [Urine:75; Stool:250]  Labs:  Recent Labs  04/03/14 0149 04/04/14 0500  WBC 12.5* 12.5*  HGB 8.6* 9.2*  HCT 26.6* 27.8*  PLT 171 257     Recent Labs  04/03/14 0148 04/04/14 0500  NA 132* 130*  K 3.7 4.0  CL 95* 92*  CO2 23 21  GLUCOSE 106* 121*  BUN 47* 70*  CREATININE 3.26* 4.15*  CALCIUM 8.9 10.1  MG  --  1.7  PHOS  --  3.4  PROT  --  5.4*  ALBUMIN  --  1.8*  AST  --  19  ALT  --  15  ALKPHOS  --  138*  BILITOT  --  0.3  PREALBUMIN  --  11.8*  TRIG  --  110   Estimated Creatinine Clearance: 7.9 mL/min (by C-G formula based on Cr of 4.15).   No results for input(s): GLUCAP in the last 72 hours.     Insulin Requirements in the past 24 hours:  SSI d/c'd 10/26  Assessment:  81 YOF transferred from [place] with perforated bowel - CT showed free air and thickened small bowel. She was taken to OR 03/24/2014 PM for ex-lap, application of open wound VAC and sigmoid colon resection.  Patient continues on TPN for partial nutritional support.  GI: hx duodenal ulcers; POD#12 ex-lap, sigmoid colon resection. Went back to OR 10/22 for re-exp, colostomy >> found to have several liver nodules suspicious for malignancy which were biopsied. Diet advanced to dysphagia diet however, no PO intake documented and continued poor appetite. Surgery  recommended feeding tube for enteral nutrition. TPN previously dc'd however, MD wanting to continue until eating improves.  Megestrol added. CT scan ordered to r/o post-op infection but pt refused.  Prealbumin trending back up. Endo: hx hypothyroidism, TSH 1.6 on admit, continued on home Synthroid . No noted history of DM, no A1C on file. CBGs low-nl since starting TPN; changed IV synthroid to PO Lytes: none in TPN - low Na/CL, CoCa elevated at 11.86 Renal: ESRD on HD MWF with extra HD on 10/24 due to TPN. Renal started CRRT 10/21 PM, then stopped 10/23. Pt wants continued treatment but refusing dialysis sometimes. Renal ok'd increasing TPN rate to 82ml/hr. Cards: CAD s/p CABG in 2008, HF with EF 25-30%, afib - VSS Hepatobil: LFTs remain WNL except alk phos. Albumin low at 1.8. Baseline TG 199, now 100 - remains anemic but thrombocytopenia resolved, no overt bleeding ID: Zosyn D#14 for bowel perf. Blood and urine cultures NGTD, trach aspirate with few Candida - Afebrile, WBC 12.5 Pulm: remains on 2L [place] - [place], Pulmicort, Spiriva Best Practices: SCDs, MC TPN Access: CVC double lumen left IJ placed 10/20 TPN day#: 13 (10/21 >> )  Current Nutrition:  TPN Ensure (received 1 yesterday) Dysphagia diet - no intake documented  Nutritional Goals:  1500-1700 kCal, 75-85 grams of protein per day  Plan: - Continue Clinimix 5/15 (NO ELECTROLYTES) at 101ml/hr (max per Renal) and IVFE at 10 ml/hr on Mondays only.  Concern that Ensure alone will not meet patient's fatty acids need.  TPN provides a daily average per week of 914 kCal and 60gm protein, meeting 61% of minimum kCal and 80% of minimal protein needs.  Unable to concentrate TPN since they are pre-mixed so we are unable to meet nutritional needs due to fluid limitations. - No multivitamin nor trace elements in TPN as patient is on PO multivitamin - F/U long-term nutrition plan    Gearldean Lomanto D. Mina Marble, PharmD, BCPS Pager:  [phone] 04/05/2014, 8:52  AM

## 2014-04-05 NOTE — Progress Notes (Signed)
Patient ID: [name], female   DOB: 07-17-1932, 80 y.o.   MRN: [id number] 12 Days Post-Op  Subjective: Pt still with abdominal pain.  Not eating.  Refuses CT scan still.  Unsure if she wants to give up dialysis for good.  Objective: Vital signs in last 24 hours: Temp:  [97.6 F (36.4 C)-98.9 F (37.2 C)] 97.6 F (36.4 C) (11/03 0735) Pulse Rate:  [75-81] 75 (11/03 0740) Resp:  [12-24] 19 (11/03 0740) BP: (130-147)/(48-61) 132/58 mmHg (11/03 0740) SpO2:  [92 %-100 %] 100 % (11/03 0841) Last BM Date: 04/01/14  Intake/Output from previous day: 11/02 0701 - 11/03 0700 In: 1805 [P.O.:50; I.V.:229.2; IV Piggyback:200; TPN:1325.8] Out: 325 [Urine:75; Stool:250] Intake/Output this shift: Total I/O In: 190 [I.V.:20; IV Piggyback:50; TPN:120] Out: -   PE: Abd: abdominal wound has completely dehisced.  She is not eviscerated.  Ostomy with good output.  Lab Results:   Recent Labs  04/03/14 0149 04/04/14 0500  WBC 12.5* 12.5*  HGB 8.6* 9.2*  HCT 26.6* 27.8*  PLT 171 257   BMET  Recent Labs  04/03/14 0148 04/04/14 0500  NA 132* 130*  K 3.7 4.0  CL 95* 92*  CO2 23 21  GLUCOSE 106* 121*  BUN 47* 70*  CREATININE 3.26* 4.15*  CALCIUM 8.9 10.1   PT/INR No results for input(s): LABPROT, INR in the last 72 hours. CMP     Component Value Date/Time   NA 130* 04/04/2014 0500   K 4.0 04/04/2014 0500   CL 92* 04/04/2014 0500   CO2 21 04/04/2014 0500   GLUCOSE 121* 04/04/2014 0500   BUN 70* 04/04/2014 0500   CREATININE 4.15* 04/04/2014 0500   CALCIUM 10.1 04/04/2014 0500   PROT 5.4* 04/04/2014 0500   ALBUMIN 1.8* 04/04/2014 0500   AST 19 04/04/2014 0500   ALT 15 04/04/2014 0500   ALKPHOS 138* 04/04/2014 0500   BILITOT 0.3 04/04/2014 0500   GFRNONAA 9* 04/04/2014 0500   GFRAA 11* 04/04/2014 0500   Lipase  No results found for: LIPASE     Studies/Results: No results found.  Anti-infectives: Anti-infectives    Start     Dose/Rate Route Frequency Ordered  Stop   03/31/14 0800  piperacillin-tazobactam (ZOSYN) IVPB 2.25 g     2.25 g100 mL/hr over 30 Minutes Intravenous Every 8 hours 03/31/14 0738     03/25/14 2000  piperacillin-tazobactam (ZOSYN) IVPB 2.25 g  Status:  Discontinued     2.25 g100 mL/hr over 30 Minutes Intravenous 3 times per day 03/25/14 1339 03/30/14 1550   03/25/14 1345  piperacillin-tazobactam (ZOSYN) IVPB 2.25 g  Status:  Discontinued     2.25 g100 mL/hr over 30 Minutes Intravenous 3 times per day 03/25/14 1335 03/25/14 1339   03/31/2014 1400  vancomycin (VANCOCIN) 500 mg in sodium chloride 0.9 % 100 mL IVPB  Status:  Discontinued     500 mg100 mL/hr over 60 Minutes Intravenous Every 24 hours 03/23/14 1503 03/25/14 0856   03/23/14 2100  piperacillin-tazobactam (ZOSYN) IVPB 2.25 g  Status:  Discontinued     2.25 g100 mL/hr over 30 Minutes Intravenous 4 times per day 03/23/14 1455 03/25/14 1335   03/23/14 0500  piperacillin-tazobactam (ZOSYN) IVPB 2.25 g  Status:  Discontinued     2.25 g100 mL/hr over 30 Minutes Intravenous 3 times per day 08-02-2013 2242 03/23/14 1455   08-02-2013 2245  [MAR Hold]  vancomycin (VANCOCIN) IVPB 750 mg/150 ml premix     (MAR Hold since 03/23/14 0020)  750 mg150 mL/hr over 60 Minutes Intravenous  Once 01/03/14 2241 03/23/14 0024       Assessment/Plan    s/p Procedure(s):  EXPLORATORY LAPAROTOMY  COLOSTOMY POD12 2. SPCM/TNA 3. ESRD 4. Multiple medical problems 5. Complete abdominal wound dehiscence, no evisceration  Plan: 1. Cont BID dressing changes to abdominal wound to help keep this moist.  Bowel is present, but does have some granulation tissue over this.  There is not current evidence of evisceration.  If this were to happen, patient does not want any further surgery and would need to be full comfort care, which seems to be the way the family is leaning anyway. 2. Will get an abdominal binder to give her a little extra support.   LOS: 14 days    Izzak Fries E 04/05/2014, 9:41  AM Pager: [phone]

## 2014-04-05 NOTE — Consult Note (Signed)
WOC reviewed chart and it appears family and patient are leaning towards comfort care.  I have completed one teaching session with the family and the patient is not able to participate in her care currently.  Supplies in the patients room, at this time I am not planning to educate the patient on the care and if she is planning transition to hospice may be that the hospice staff takes care of her ostomy. Will continue to follow along with you.  Vane Yapp [place] RN,CWOCN [phone]

## 2014-04-06 MED ORDER — HYDROMORPHONE HCL 1 MG/ML IJ SOLN
0.5000 mg | INTRAMUSCULAR | Status: DC | PRN
  Administered 2014-04-07: 0.5 mg via INTRAVENOUS
  Filled 2014-04-06: qty 1

## 2014-04-06 MED ORDER — CLINIMIX/DEXTROSE (5/15) 5 % IV SOLN
INTRAVENOUS | Status: DC
  Filled 2014-04-06: qty 2000

## 2014-04-06 MED ORDER — FENTANYL 25 MCG/HR TD PT72: 25.0000 ug | MEDICATED_PATCH | TRANSDERMAL | Status: DC

## 2014-04-06 MED ORDER — DIAZEPAM 5 MG/ML IJ SOLN: 2.5000 mg | Freq: Four times a day (QID) | INTRAMUSCULAR | Status: DC | PRN

## 2014-04-06 NOTE — Progress Notes (Addendum)
NUTRITION FOLLOW UP  Intervention:    TPN dosing per pharmacy  Continue Ensure Pudding po TID, each supplement provides 170 kcal and 4 grams of protein.  Continue Magic cup TID with meals, each supplement provides 290 kcal and 9 grams of protein  Nutrition Dx:   Inadequate oral intake now related to poor appetite as evidenced by 0-5% meal completion, ongoing  Goal:   Pt to meet >/= 90% of their estimated nutrition needs, currently unmet  Monitor:   TPN prescription, PO intake, weight, labs, overall goals of care  Assessment:   80 y.o. female with history of CAD s/p CABG, systolic CHF, COPD, tobacco use, ESRD presenting with pneumoperitoneum.   Patient s/p procedures 10/22: EXPLORATORY LAPAROTOMY  COLOSTOMY  LIVER BIOPSY  Continues on Dys 1, pudding thick liquid diet.  PO intake 0-5% of meals.  Is receiving Ensure Pudding TID, but overall intake is still inadequate.   Has been receiving TPN since 10/21 for pneumoperitoneum with bowel resection. Nephrology following for HD needs. Palliative Care Team following with potential to transition to comfort care.   Spoke with patient's family member (? Granddaughter), who reports that patient is eating minimally. She wants jello and thin liquids.    Patient is receiving TPN with Clinimix 5/15 @ 50 ml/hr (max rate per renal).  Only receiving IVFE at 10 ml/hr on Mondays. Provides an average of 921 kcal and 60 grams protein per day.  Meets 61% minimum estimated energy needs and 80% minimum estimated protein needs. Calorie and protein provision is limited due to fluid limitations for renal purposes.   Height: Ht Readings from Last 1 Encounters:  03/31/14 5\' 2"  (1.575 m)    Weight Status:   Wt Readings from Last 1 Encounters:  04/06/14 100 lb (45.36 kg)    Body mass index is 18.29 kg/(m^2).  Underweight  Re-estimated needs:  Kcal: 1500-1700 Protein: 75-85 gm Fluid: per MD  Skin: surgical incision to abdomen with open wound vac,  Stage I pressure ulcer on sacrum, +1 RLE and +2 LLE edema  Diet Order: DIET - DYS 1 TPN (CLINIMIX) Adult without lytes TPN (CLINIMIX) Adult without lytes   Dysphagia 1, pudding thick liquids   Intake/Output Summary (Last 24 hours) at 04/06/14 1405 Last data filed at 04/06/14 1100  Gross per 24 hour  Intake   1180 ml  Output    225 ml  Net    955 ml   Last BM: 11/4  Labs:   Recent Labs Lab 04/02/14 0415 04/03/14 0148 04/04/14 0500  NA 136* 132* 130*  K 4.6 3.7 4.0  CL 97 95* 92*  CO2 23 23 21   BUN 62* 47* 70*  CREATININE 4.13* 3.26* 4.15*  CALCIUM 9.4 8.9 10.1  MG  --   --  1.7  PHOS  --   --  3.4  GLUCOSE 101* 106* 121*    CBG (last 3)  No results for input(s): GLUCAP in the last 72 hours.  Scheduled Meds: . antiseptic oral rinse  7 mL Mouth Rinse BID  . arformoterol  15 mcg Nebulization BID  . budesonide (PULMICORT) nebulizer solution  0.5 mg Nebulization BID  . feeding supplement (ENSURE)  1 Container Oral TID BM  . fentaNYL  12.5 mcg Transdermal Q72H  . levothyroxine  150 mcg Oral QAC breakfast  . megestrol  400 mg Oral BID  . mirtazapine  7.5 mg Oral QHS  . multivitamin with minerals  1 tablet Oral Daily  . olopatadine  1 drop Both Eyes BID  . ondansetron (ZOFRAN) IV  4 mg Intravenous 3 times per day  . oxymetazoline  1 spray Each Nare BID  . pantoprazole (PROTONIX) IV  40 mg Intravenous Q24H  . piperacillin-tazobactam (ZOSYN)  IV  2.25 g Intravenous Q8H  . tiotropium  18 mcg Inhalation Daily    Continuous Infusions: . sodium chloride 10 mL/hr at 04/01/14 0700  . TPN (CLINIMIX) Adult without lytes Stopped (04/06/14 [id number])  . TPN [facility](CLINIMIX) Adult without lytes       Joaquin CourtsKimberly Sherriann Szuch, RD, LDN, CNSC Pager [phone] After Hours Pager [phone]

## 2014-04-06 NOTE — Progress Notes (Signed)
Patient ID: [name]  female  WUJ:[id number]    DOB: 03-Apr-1933    DOA: 03/03/2014  PCP: Galvin Proffer, MD   Admit HPI / Brief Narrative:  80 y.o. female with history of CAD s/p CABG, systolic CHF, COPD, tobacco use, and ESRD who presented with AMS and dyspnea and was found to have a pneumoperitoneum. Difficult extubation was expected postoperatively and PCCM was consulted.  Significant Events:  10/20 Ex lap and removal of necrotic bowel; on neo  10/21 On levophed and neo  10/22 Back to OR, fascial closure. Finding of multiple liver nodules not previously noted. Liver biopsy obtained- benign 10/22 Family conference. Pt made DNR in event of cardiac arrest.  10/23 Off vasopressors, extubated with adequate resp status post extubaiton  10/24 Stable for tx to SDU  10/25 [facility] assumed care  Since the hospitalist have assume the patient's care the primary focus has been advancement of her diet/nutritional status. Patient has been refusing full dialysis, refusing to eat, refusing CT scans   Assessment/Plan:  Acute on chronic respiratory failure, post operative VDRF : -coarse breath sounds, patient has history of severe COPD, ongoing tobacco abuse, extubated on 10/23, not completing hemodialysis - chest x-ray 10/29 showed increasing bilateral atelectasis, placed on Zosyn,  - Cont nebulized steroids and BD's, ordered flutter valve and incentive spirometry, O2, deep suctioning  - Needs hemodialysis for volume management, however patient has not been completing or refusing (see nephrology notes)  Severe sepsis and septic shock, secondary to peritonitis  -  improved, respiratory culture had shown few Candida albicans otherwise blood cultures, urine cultures all negative, patient had received Zosyn for 7 days, finished on 10/27.  -zosyn restarted 10/30- ? treating  Perforated sigmoid colon due to Intestinal ischemia  - Post laparotomy, sigmoid colectomy and liver biopsy, creation of colostomy  with abdominal closure  - On TPN, patient needs to start eating slowly, on dysphagia 1 diet however she is not eating - Needs PT/OT, increase ambulation - We will continue TPN until patient establishes that she is tolerating diet and eating - Placed on fentanyl for pain control, palliative medicine following closely  ESRD (HD on MWF)  - per Nephrology, discussed with Dr. Marisue Humble today, difficult situation. As outpatient also, she has frequently terminated her hemodialysis early. Patient had refused hemodialysis on 10/28, went for hemodialysis on 10/29 but stopped early due to pain and cramping. She is volume overloaded. Palliative medicine consult was called, 10/29, per goals of care, patient and family requested to continue with hemodialysis and better pain control. Patient continues to refuse to complete dialysis -continues to refuse dialysis  Chronic systolic CHF, ESRD - HD for volume control   History of atrial fibrillation  - rate controlled, in NSR   History of complete heart block with pacemaker  CAD s/p 3v CABG  -No chest pain    Liver nodules  - biopsy results indicate benign process  - colon pathology negative for malignancy   Anemia of CKD  - no signs of active bleeding  - follow CBC   Thrombocytopenia  - SCD's for DVT prophylaxis   Hypothyroidism  - Continue home Synthroid dose   Severe PCM  - secondary to acute illness with complications outlined above - patient states she is not interested in having an NG tube placed  -not eating well -TPN  Acute functional quadriplegia  - secondary to acute illness as noted above  - PT/OT evaluation pending  Hypokalemia-replaced  Leukocytosis -stable at  12.5  DVT Prophylaxis: SCDs  Code Status: DO NOT RESUSCITATE  Family Communication: daughter on phone 11/1  Disposition: as patient is refusing dialysis, will not send to LTAC-family working with palliative care and if will not do dialysis - plan to make comfort  care  Consultants:  Nephrology  Gen. Surgery  Ccm   Palliative care  Procedures: Ex lap and removal of necrotic bowel   Antibiotics:  zosyn   Subjective: Spoke about dialysis- not sure if she wants to do today Pain better  Objective: Weight change:   Intake/Output Summary (Last 24 hours) at 04/06/14 0754 Last data filed at 04/06/14 0600  Gross per 24 hour  Intake   1620 ml  Output    275 ml  Net   1345 ml   Blood pressure 128/50, pulse 75, temperature 97.8 F (36.6 C), temperature source Oral, resp. rate 26, height 5\' 2"  (1.575 m), weight 45.36 kg (100 lb), SpO2 100 %.  Physical Exam: General: Alert and awake, oriented to place, not time CVS: S1-S2 clear, no murmur rubs or gallops Chest: diffuse rhonchi  bilaterally  Abdomen: tender, midline incision, dressing intact  Extremities: no cyanosis, clubbing or edema noted bilaterally   Lab Results: Basic Metabolic Panel:  Recent Labs Lab 04/03/14 0148 04/04/14 0500  NA 132* 130*  K 3.7 4.0  CL 95* 92*  CO2 23 21  GLUCOSE 106* 121*  BUN 47* 70*  CREATININE 3.26* 4.15*  CALCIUM 8.9 10.1  MG  --  1.7  PHOS  --  3.4   Liver Function Tests:  Recent Labs Lab 04/04/14 0500  AST 19  ALT 15  ALKPHOS 138*  BILITOT 0.3  PROT 5.4*  ALBUMIN 1.8*   No results for input(s): LIPASE, AMYLASE in the last 168 hours. No results for input(s): AMMONIA in the last 168 hours. CBC:  Recent Labs Lab 04/03/14 0149 04/04/14 0500  WBC 12.5* 12.5*  NEUTROABS  --  10.8*  HGB 8.6* 9.2*  HCT 26.6* 27.8*  MCV 101.5* 99.6  PLT 171 257   Cardiac Enzymes: No results for input(s): CKTOTAL, CKMB, CKMBINDEX, TROPONINI in the last 168 hours. BNP: Invalid input(s): POCBNP CBG:  Recent Labs Lab 03/30/14 0948  GLUCAP 100*     Micro Results: No results found for this or any previous visit (from the past 240 hour(s)).  Studies/Results: Dg Chest 2 View  03/15/2014   CLINICAL DATA:  COUGH ABDOMINAL PAIN cough.   Shortness of breath.  EXAM: CHEST  2 VIEW  COMPARISON:  CT abdomen 01/31/2014. Multiple chest radiographs dating back to 06/07/2013.  FINDINGS: Cardiomegaly. Median sternotomy/ CABG. Pulmonary vascular congestion is present. Diffuse interstitial prominence likely rib relates to chronic congestive heart failure. Bilateral pleural apical thickening. Thoracic compression fractures present. Coronary artery stents. Two lead RIGHT subclavian cardiac pacemaker leads are unchanged. Calcified av fistula is present in the LEFT arm.  IMPRESSION: Chronic cardiomegaly and pulmonary vascular congestion. No interval change or acute abnormality.   Electronically Signed   By: Andreas NewportGeoffrey  Lamke M.D.   On: 03/15/2014 18:47   Dg Chest Port 1 View  03/23/2014   CLINICAL DATA:  Check central line placement  EXAM: PORTABLE CHEST - 1 VIEW  COMPARISON:  03/23/2014 1814 hrs  FINDINGS: Cardiac shadow is stable. Postsurgical changes are again seen. A pacing device is again noted and stable. The endotracheal tube is noted 4.4 cm above the carina. A nasogastric catheter courses towards the stomach. The left central venous line has been advanced  and now lies with the tip in the proximal superior vena cava. No pneumothorax is noted. Bibasilar infiltrates are again identified.  IMPRESSION: Tubes and lines as described.  Stable bibasilar changes.   Electronically Signed   By: Alcide CleverMark  Lukens M.D.   On: 03/23/2014 20:25   Dg Chest Port 1 View  03/23/2014   CLINICAL DATA:  Left central line placement.  EXAM: PORTABLE CHEST - 1 VIEW  COMPARISON:  03/23/2014 at 24:49 a.m.  FINDINGS: Endotracheal tube tip is indistinct but believed to be about 4.4 cm above the carina. Nasogastric tube enters the stomach. Left IJ line appears to of retracted slightly and now projects over the bottom of the aortic arch. This is probably in the innominate vein.  Prior CABG. Indistinct pulmonary vasculature. Stable low level airspace opacity in the right infrahilar  region and retrocardiac region. coronary stent.  IMPRESSION: 1. Left IJ line has retracted somewhat, with tip projecting over the lower margin of the thoracic aorta, but likely in a venous structure given prior positioning. One could perform lateral view or chest CT to confirm line positioning. 2. Stable mild bibasilar airspace opacities. Interstitial accentuation in the upper lung zones may reflect mild pulmonary venous hypertension.   Electronically Signed   By: Herbie BaltimoreWalt  Liebkemann M.D.   On: 03/23/2014 18:44   Dg Chest Port 1 View  03/23/2014   CLINICAL DATA:  Evaluate endotracheal tube placement  EXAM: PORTABLE CHEST - 1 VIEW  COMPARISON:  Chest x-ray of 03/15/2014  FINDINGS: The tip of the endotracheal tube is approximately 2.9 cm above the carina. Cardiomegaly and minimal pulmonary vascular congestion remains. Left central venous line is present with the tip seen to the junction of the left innominate vein with the SVC. Pacemaker remains.  IMPRESSION: 1. Endotracheal tube 2.9 cm above the carina. 2. Left IJ central venous line tip near the junction of the left innominate vein and SVC. 3. Stable cardiomegaly with minimal congestion endotracheal tube placement   Electronically Signed   By: Dwyane DeePaul  Barry M.D.   On: 03/23/2014 07:52   Dg Chest Port 1v Same Day  03/31/2014   CLINICAL DATA:  Dyspnea and aspiration pneumonia  EXAM: PORTABLE CHEST - 1 VIEW SAME DAY  COMPARISON:  03/26/2014  FINDINGS: Cardiac shadow is within normal limits. Postsurgical changes are again noted. A pacing device is stable. The left central venous line appears to have been withdrawn in the interval. The tip now lies over the aortic knob. Clinical correlation is recommended. The lungs are well aerated bilaterally. Increased left retrocardiac atelectasis and right basilar atelectasis is noted.  IMPRESSION: Increasing bibasilar atelectasis.  Interval withdrawal of the left central venous line now lying over the aortic knob. Clinical  correlation is recommended.   Electronically Signed   By: Alcide CleverMark  Lukens M.D.   On: 03/31/2014 08:02   Dg Chest Port 1v Same Day  03/26/2014   CLINICAL DATA:  Pulmonary edema, history of CHF, coronary artery disease post CABG, COPD, hypertension, end-stage renal disease on dialysis, atrial fibrillation  EXAM: PORTABLE CHEST - 1 VIEW SAME DAY  COMPARISON:  Portable exam 1213 hr compared to 03/23/2014  FINDINGS: Nasogastric tube extends into stomach.  LEFT jugular central venous catheter tip projects over SVC.  RIGHT subclavian sequential pacemaker leads project over RIGHT atrium and RIGHT ventricle.  Enlargement of cardiac silhouette post CABG.  Pulmonary vascular congestion.  Peribronchial thickening and slight chronic accentuation of interstitial markings likely reflect minimal failure.  No segmental infiltrate, pleural effusion  or pneumothorax.  Bones demineralized.  IMPRESSION: Enlargement of cardiac silhouette post CABG and pacemaker.  Question minimal pulmonary edema.   Electronically Signed   By: Ulyses Southward M.D.   On: 03/26/2014 14:22   Dg Abd Portable 1v  03/26/2014   CLINICAL DATA:  NG tube placement.  EXAM: PORTABLE ABDOMEN - 1 VIEW  COMPARISON:  03/23/2014  FINDINGS: NG tube is seen, entering the left lung with the tip at the left lung base. Recommend complete removal and replacement.  Nonobstructive bowel gas pattern. Heavily calcified aorta. The right growing central line is in place with the tip in the lower abdomen.  IMPRESSION: NG tube in the left lung with the tip in the left lung base.  Critical Value/emergent results were called by telephone at the time of interpretation on 03/26/2014 at 7:11 pm to patient's nurse Georgeann Oppenheim who verbally acknowledged these results.   Electronically Signed   By: Charlett Nose M.D.   On: 03/26/2014 19:12   Dg Abd Portable 1v  03/23/2014   CLINICAL DATA:  Encounter for nasogastric tube placement  EXAM: PORTABLE ABDOMEN - 1 VIEW  COMPARISON:  CT abdomen  pelvis of 03/20/2014  FINDINGS: The tip of the feeding tube is below the hemidiaphragm probably overlying the mid portion of the stomach. The bowel gas pattern is nonspecific. Considerable arterial calcification is present throughout the abdominal aorta and iliac arteries.  IMPRESSION: Tip of NG tube overlies the region of the body of the stomach.   Electronically Signed   By: Dwyane Dee M.D.   On: 03/23/2014 07:49    Medications: Scheduled Meds: . antiseptic oral rinse  7 mL Mouth Rinse BID  . arformoterol  15 mcg Nebulization BID  . budesonide (PULMICORT) nebulizer solution  0.5 mg Nebulization BID  . feeding supplement (ENSURE)  1 Container Oral TID BM  . fentaNYL  12.5 mcg Transdermal Q72H  . levothyroxine  150 mcg Oral QAC breakfast  . megestrol  400 mg Oral BID  . mirtazapine  7.5 mg Oral QHS  . multivitamin with minerals  1 tablet Oral Daily  . olopatadine  1 drop Both Eyes BID  . ondansetron (ZOFRAN) IV  4 mg Intravenous 3 times per day  . oxymetazoline  1 spray Each Nare BID  . pantoprazole (PROTONIX) IV  40 mg Intravenous Q24H  . piperacillin-tazobactam (ZOSYN)  IV  2.25 g Intravenous Q8H  . tiotropium  18 mcg Inhalation Daily   Time 35 min   LOS: 15 days   Maria Hewett  DO [facility] 04/06/2014, 7:54 AM Pager: [phone]  If 7PM-7AM, please contact night-coverage [email or web address] Password [email or web address]

## 2014-04-06 NOTE — Progress Notes (Signed)
Physical Therapy Treatment Patient Details Name: [name] MRN: [id number] DOB: Sep 22, 1932 Today's Date: 04/06/2014    History of Present Illness Pt admitted with pneumoperitoneum.  S/P removal of necrotic bowel 10/20 and fascial closure 10/22.  Pt with liver biopsy results pending.  PMH:  COPD, severe sepsis and septic shock from with perontenitis, CHF, afib, ESRD.    PT Comments    Pt with minimal participation in mobility at this time.  Please advise if plan changes to be comfort care for pt.  Will continue to follow.    Follow Up Recommendations  SNF     Equipment Recommendations  None recommended by PT    Recommendations for Other Services       Precautions / Restrictions Precautions Precautions: Fall Restrictions Weight Bearing Restrictions: No    Mobility  Bed Mobility Overal bed mobility: Needs Assistance;+2 for physical assistance Bed Mobility: Supine to Sit     Supine to sit: Mod assist;+2 for physical assistance     General bed mobility comments: pt attempted to A with coming to sitting.    Transfers Overall transfer level: Needs assistance Equipment used: 2 person hand held assist Transfers: Sit to/from UGI CorporationStand;Stand Pivot Transfers Sit to Stand: Max assist;+2 physical assistance Stand pivot transfers: Max assist;+2 physical assistance       General transfer comment: pt minimally assisting with transfer today.    Ambulation/Gait                 Stairs            Wheelchair Mobility    Modified Rankin (Stroke Patients Only)       Balance Overall balance assessment: Needs assistance Sitting-balance support: No upper extremity supported;Feet supported Sitting balance-Leahy Scale: Fair     Standing balance support: Bilateral upper extremity supported;During functional activity Standing balance-Leahy Scale: Poor                      Cognition Arousal/Alertness: Awake/alert Behavior During Therapy: Flat  affect Overall Cognitive Status: No family/caregiver present to determine baseline cognitive functioning                      Exercises      General Comments        Pertinent Vitals/Pain Pain Assessment: No/denies pain    Home Living                      Prior Function            PT Goals (current goals can now be found in the care plan section) Acute Rehab PT Goals PT Goal Formulation: With patient Time For Goal Achievement: 04/11/14 Potential to Achieve Goals: Good Progress towards PT goals: Not progressing toward goals - comment (limited participation)    Frequency  Min 2X/week    PT Plan Current plan remains appropriate    Co-evaluation             End of Session Equipment Utilized During Treatment: Gait belt Activity Tolerance: Patient limited by fatigue Patient left: in chair;with call bell/phone within reach;with chair alarm set     Time: [id number] PT Time Calculation (min): 21 min  Charges:  $Therapeutic Activity: 8-22 mins                    G CodesSunny Schlein:      [name], [place] CarolinaPT [phone] 04/06/2014, 12:07 PM

## 2014-04-06 NOTE — Progress Notes (Signed)
Physician notified: Phillips OdorGolding  At: 0913  Regarding: please call when available r/t HD and TNA. Awaiting return response.   Returned Response at: 0915  Order(s): Stop TNA, will follow up.

## 2014-04-06 NOTE — Progress Notes (Signed)
[facility] Progress Note  13 Days Post-Op  Subjective: Pt okay.  Not eating much.  Says she has minimal abdominal pain and mild nausea.  Says she gets full quickly.  Lying in chair today.    Objective: Vital signs in last 24 hours: Temp:  [97.5 F (36.4 C)-98.6 F (37 C)] 97.8 F (36.6 C) (11/04 0406) Pulse Rate:  [75-80] 78 (11/04 0837) Resp:  [10-26] 22 (11/04 0837) BP: (124-156)/(49-59) 128/50 mmHg (11/04 0837) SpO2:  [98 %-100 %] 98 % (11/04 0837) Weight:  [100 lb (45.36 kg)] 100 lb (45.36 kg) (11/04 0500) Last BM Date: 04/01/14  Intake/Output from previous day: 11/03 0701 - 11/04 0700 In: 1620 [I.V.:230; IV Piggyback:150; TPN:1240] Out: 275 [Stool:275] Intake/Output this shift:    PE: Gen:  Alert, NAD, pleasant Abd: Soft, NT/ND, +BS, no HSM, midline wound with evidence of dehiscence and bowel present, but beefy red granulation filling in, no evisceration, no significant leakage of peritoneal fluid.     Lab Results:   Recent Labs  04/04/14 0500  WBC 12.5*  HGB 9.2*  HCT 27.8*  PLT 257   BMET  Recent Labs  04/04/14 0500  NA 130*  K 4.0  CL 92*  CO2 21  GLUCOSE 121*  BUN 70*  CREATININE 4.15*  CALCIUM 10.1   PT/INR No results for input(s): LABPROT, INR in the last 72 hours. CMP     Component Value Date/Time   NA 130* 04/04/2014 0500   K 4.0 04/04/2014 0500   CL 92* 04/04/2014 0500   CO2 21 04/04/2014 0500   GLUCOSE 121* 04/04/2014 0500   BUN 70* 04/04/2014 0500   CREATININE 4.15* 04/04/2014 0500   CALCIUM 10.1 04/04/2014 0500   PROT 5.4* 04/04/2014 0500   ALBUMIN 1.8* 04/04/2014 0500   AST 19 04/04/2014 0500   ALT 15 04/04/2014 0500   ALKPHOS 138* 04/04/2014 0500   BILITOT 0.3 04/04/2014 0500   GFRNONAA 9* 04/04/2014 0500   GFRAA 11* 04/04/2014 0500   Lipase  No results found for: LIPASE     Studies/Results: No results found.  Anti-infectives: Anti-infectives    Start     Dose/Rate Route Frequency Ordered Stop   03/31/14 0800  piperacillin-tazobactam (ZOSYN) IVPB 2.25 g     2.25 g100 mL/hr over 30 Minutes Intravenous Every 8 hours 03/31/14 0738     03/25/14 2000  piperacillin-tazobactam (ZOSYN) IVPB 2.25 g  Status:  Discontinued     2.25 g100 mL/hr over 30 Minutes Intravenous 3 times per day 03/25/14 1339 03/30/14 1550   03/25/14 1345  piperacillin-tazobactam (ZOSYN) IVPB 2.25 g  Status:  Discontinued     2.25 g100 mL/hr over 30 Minutes Intravenous 3 times per day 03/25/14 1335 03/25/14 1339   01-20-2014 1400  vancomycin (VANCOCIN) 500 mg in sodium chloride 0.9 % 100 mL IVPB  Status:  Discontinued     500 mg100 mL/hr over 60 Minutes Intravenous Every 24 hours 03/23/14 1503 03/25/14 0856   03/23/14 2100  piperacillin-tazobactam (ZOSYN) IVPB 2.25 g  Status:  Discontinued     2.25 g100 mL/hr over 30 Minutes Intravenous 4 times per day 03/23/14 1455 03/25/14 1335   03/23/14 0500  piperacillin-tazobactam (ZOSYN) IVPB 2.25 g  Status:  Discontinued     2.25 g100 mL/hr over 30 Minutes Intravenous 3 times per day 03/16/2014 2242 03/23/14 1455   03/16/2014 2245  [MAR Hold]  vancomycin (VANCOCIN) IVPB 750 mg/150 ml premix     (MAR Hold since 03/23/14 0020)  750 mg150 mL/hr over 60 Minutes Intravenous  Once Nov 29, 2013 2241 03/23/14 0024       Assessment/Plan 1. Sigmoid perforation with intestinal ischemia POD #13 s/p EXPLORATORY LAPAROTOMY &COLOSTOMY & LIVER BIOPSY 2. SPCM/TNA 3. ESRD 4. Multiple medical problems 5. Complete abdominal wound dehiscence, no evisceration 6. Some degree of chronic mesenteric ischemia  Plan: 1. Cont BID dressing changes to abdominal wound to help keep this moist. Bowel is present, but does have some granulation tissue is filling in. No evidence of evisceration. If this were to happen, patient does not want any further surgery and would need to be full comfort care.  Per palliative care family willing to start some dialysis and if she can not complete then will transition to comfort  care. 2.  Abdominal binder to give her a little extra support, but is not currently in the right position.  Will have the nurses cut a hole to fit the ostomy through so that it can be around her abdomen and not her chest. 3.  No other recommendations    LOS: 15 days    DORT, Demone Lyles 04/06/2014, 9:49 AM Pager: [phone]

## 2014-04-06 NOTE — Progress Notes (Addendum)
Occupational Therapy Treatment Patient Details Name: [name] MRN: [id number] DOB: 12/29/1932 Today's Date: 04/06/2014    History of present illness Pt admitted with pneumoperitoneum.  S/P removal of necrotic bowel 10/20 and fascial closure 10/22.  Pt with liver biopsy results pending.  PMH:  COPD, severe sepsis and septic shock from with perontenitis, CHF, afib, ESRD.   OT comments  Pt lethargic in session and in pain. OT assisted nursing in cutting a hole in abdominal binder for pt's colostomy bag to fit through binder to allow a better fit. Pt with minimal participation today.  Follow Up Recommendations  SNF;Supervision/Assistance - 24 hour    Equipment Recommendations  Other (comment) (defer to next venue)    Recommendations for Other Services      Precautions / Restrictions Precautions Precautions: Fall Restrictions Weight Bearing Restrictions: No       Mobility Bed Mobility Overal bed mobility: Needs Assistance;+2 for physical assistance Bed Mobility: Sit to Supine     Sit to supine: +2 for physical assistance;Total assist   General bed mobility comments: +2 assist to scoot HOB. assist with legs/trunk. pt lethargic and in pain.  Transfers Overall transfer level: Needs assistance Equipment used: 2 person hand held assist Transfers: Sit to/from [employer] CorporationStand;Stand Pivot Transfers Sit to Stand: +2 physical assistance;Max assist Stand pivot transfers: +2 physical assistance;Max assist       General transfer comment: transferred from chair to bed. Assist to boost.         ADL Overall ADL's : Needs assistance/impaired     Grooming: Wash/dry face;Brushing hair;Moderate assistance;Sitting (OT combed hair)       Lower Body Bathing: Set up;Supervison/ safety;Sitting/lateral leans (washed off upper legs)           Toilet Transfer: +2 for physical assistance;Maximal assistance;Stand-pivot (from chair to bed)           Functional mobility during ADLs: +2 for  physical assistance;Maximal assistance (stand pivot) General ADL Comments: Encouraged pt to perform ADLs in chair. Pt washed face and legs and that is it. OT unbuttoned gown for pt to bathe, but she would not wash UB. Assisted pt back to bed. Encouraged pt to use incentive spirometer and pt seemed to attempt but did not move the blue bar much. Pt requesting something to drink and OT told nurse and pt also requesting ice cream/something on tray to eat and OT explained someone is to be with her.      Vision                     Perception     Praxis      Cognition  Lethargic Behavior During Therapy: Flat affect Overall Cognitive Status: No family/caregiver present to determine baseline cognitive functioning                       Extremity/Trunk Assessment               Exercises Other Exercises Other Exercises: ankle pumps   Shoulder Instructions       General Comments      Pertinent Vitals/ Pain       Pain Assessment: 0-10 Pain Score: 10-Worst pain ever Pain Location: abdomen and side Pain Intervention(s): Limited activity within patient's tolerance;Repositioned  Home Living  Prior Functioning/Environment              Frequency Min 2X/week     Progress Toward Goals  OT Goals(current goals can now be found in the care plan section)  Progress towards OT goals: Not progressing toward goals - comment  Acute Rehab OT Goals Patient Stated Goal: not stated OT Goal Formulation: Patient unable to participate in goal setting Time For Goal Achievement: 04/11/14 Potential to Achieve Goals: Good  Plan Discharge plan remains appropriate    Co-evaluation                 End of Session Equipment Utilized During Treatment: Gait belt;abdominal binder   Activity Tolerance Patient limited by lethargy;Patient limited by pain   Patient Left in bed;with call bell/phone within reach    Nurse Communication Other (comment) (pt wants something to drink)        Time: [id number] OT Time Calculation (min): 25 min  Charges: OT General Charges $OT Visit: 1 Procedure OT Treatments $Self Care/Home Management : 8-22 mins $Therapeutic Activity: 8-22 mins  Earlie RavelingStraub, Allayna Erlich L OTR/L [phone] 04/06/2014, 2:54 PM

## 2014-04-06 NOTE — Progress Notes (Signed)
[facility] Progress Note   Subjective: having some trouble breathing.  Not sure if she will do dialysis - "we'll see" she says.  Filed Vitals:   04/06/14 0428 04/06/14 0500 04/06/14 0749 04/06/14 0837  BP:   128/50 128/50  Pulse:   75 78  Temp:      TempSrc:      Resp:   26 22  Height:      Weight: 45.36 kg (100 lb) 45.36 kg (100 lb)    SpO2:   100% 98%   Exam: Gen: frail, ill weak emaciated, rattling cough Heart: RRR Lungs: bilat coarser basilar rales Abdomen: colostomy green stool, midline dressing clean Ext: bilat UE edema 1-2+ Access: left upper AVF + bruit  HD: MWF [place]  3.5h 44.5kg 2/2.25 Bath 350/800 Heparin none Profile 2  No meds      Assessment: 1 Ruptured sigmoid colon, s/p partial colectomy/colostomy - on Zosyn, afeb 2 ESRD on HD - refused dialysis Monday 3 Anemia on aranesp 25 ug /wk started here 4 HPTH no meds, labs in range 5 HTN / volume - BP's normal, at dry wt, on TNA 6 Nutrition continues to require TNA 7 Severe debility  Plan- refused HD Monday. She is not sure if she will agree to do it today, she is evasive in answering questions. She is quite debilitated. I talked with her about how it could help her breathing.  With ongoing TNA if she doesn't do dialysis she will get into resp trouble real soon. She is DNR. If she continues to refuse dialysis would recommend some palliative care measures as I believe she is beginning to have some air hunger.     Vinson Moselleob Chanele Douglas MD  pager [phone]    cell [phone]  04/06/2014, 9:03 AM     Recent Labs Lab 04/02/14 0415 04/03/14 0148 04/04/14 0500  NA 136* 132* 130*  K 4.6 3.7 4.0  CL 97 95* 92*  CO2 23 23 21   GLUCOSE 101* 106* 121*  BUN 62* 47* 70*  CREATININE 4.13* 3.26* 4.15*  CALCIUM 9.4 8.9 10.1  PHOS  --   --  3.4    Recent Labs Lab 04/04/14 0500  AST 19  ALT 15  ALKPHOS 138*  BILITOT 0.3  PROT 5.4*  ALBUMIN 1.8*    Recent Labs Lab 04/01/14 0319 04/03/14 0149  04/04/14 0500  WBC 12.8* 12.5* 12.5*  NEUTROABS  --   --  10.8*  HGB 9.3* 8.6* 9.2*  HCT 28.2* 26.6* 27.8*  MCV 100.7* 101.5* 99.6  PLT 135* 171 257   . antiseptic oral rinse  7 mL Mouth Rinse BID  . arformoterol  15 mcg Nebulization BID  . budesonide (PULMICORT) nebulizer solution  0.5 mg Nebulization BID  . feeding supplement (ENSURE)  1 Container Oral TID BM  . fentaNYL  12.5 mcg Transdermal Q72H  . levothyroxine  150 mcg Oral QAC breakfast  . megestrol  400 mg Oral BID  . mirtazapine  7.5 mg Oral QHS  . multivitamin with minerals  1 tablet Oral Daily  . olopatadine  1 drop Both Eyes BID  . ondansetron (ZOFRAN) IV  4 mg Intravenous 3 times per day  . oxymetazoline  1 spray Each Nare BID  . pantoprazole (PROTONIX) IV  40 mg Intravenous Q24H  . piperacillin-tazobactam (ZOSYN)  IV  2.25 g Intravenous Q8H  . tiotropium  18 mcg Inhalation Daily   . sodium chloride 10 mL/hr at 04/01/14 0700  . TPN (CLINIMIX)  Adult without lytes 50 mL/hr at 04/05/14 1714   albuterol, ALPRAZolam, fentaNYL, RESOURCE THICKENUP CLEAR, sodium chloride, witch hazel-glycerin

## 2014-04-06 NOTE — Progress Notes (Signed)
Chaplain visited pt upon seeing she had been in hospital for 2 weeks, that other chaplains had visited, and that her medical situation is complex. Chaplain introduced self as unit chaplain and met pt, pt's son, and pt's daughter-in-law. Pt expressed that she is in pain. Pt did not respond to chaplain speaking to her, except she seemed to respond when chaplain said, "I'm sorry you're in pain," and "I hope your pain gets better." Son Terry said "A decision needs to be made," and Terry's wife Connie said "We're trying to get her to do dialysis." Family did not seem to want to have a longer conversation with chaplain at this time. Will follow as needed.     04/06/14 1552  Clinical Encounter Type  Visited With Patient and family together  Visit Type Spiritual support  Stress Factors  Patient Stress Factors Other (Comment) (In pain)  Family Stress Factors Loss   ,  O, Chaplain 04/06/2014 3:56 PM   

## 2014-04-06 NOTE — Progress Notes (Signed)
PARENTERAL NUTRITION CONSULT NOTE - FOLLOW UP  Pharmacy Consult:  TPN Indication:  Pneumoperitoneum with bowel resection  Allergies  Allergen Reactions  . Ivp Dye [Iodinated Diagnostic Agents] Swelling  . Lisinopril Swelling  . Iohexol Itching and Swelling        Patient Measurements: Height: 5\' 2"  (157.5 cm) Weight: 100 lb (45.36 kg) IBW/kg (Calculated) : 50.1  Vital Signs: Temp: 97.8 F (36.6 C) (11/04 0406) Temp Source: Oral (11/04 0406) BP: 128/50 mmHg (11/04 0837) Pulse Rate: 78 (11/04 0837) Intake/Output from previous day: 11/03 0701 - 11/04 0700 In: 1620 [I.V.:230; IV Piggyback:150; TPN:1240] Out: 275 [Stool:275]  Labs:  Recent Labs  04/04/14 0500  WBC 12.5*  HGB 9.2*  HCT 27.8*  PLT 257     Recent Labs  04/04/14 0500  NA 130*  K 4.0  CL 92*  CO2 21  GLUCOSE 121*  BUN 70*  CREATININE 4.15*  CALCIUM 10.1  MG 1.7  PHOS 3.4  PROT 5.4*  ALBUMIN 1.8*  AST 19  ALT 15  ALKPHOS 138*  BILITOT 0.3  PREALBUMIN 11.8*  TRIG 110   Estimated Creatinine Clearance: 7.9 mL/min (by C-G formula based on Cr of 4.15).   No results for input(s): GLUCAP in the last 72 hours.     Insulin Requirements in the past 24 hours:  SSI d/c'd 10/26  Assessment:  81 YOF transferred from [place] with perforated bowel - CT showed free air and thickened small bowel. She was taken to OR 03/27/2014 PM for ex-lap, application of open wound VAC and sigmoid colon resection.  Patient continues on TPN for partial nutritional support.  GI: hx duodenal ulcers; POD#13 ex-lap, sigmoid colon resection. Went back to OR 10/22 for re-exp, colostomy >> found to have several liver nodules suspicious for malignancy which were biopsied. Diet advanced to dysphagia diet however, no PO intake documented and continued poor appetite. Surgery recommended feeding tube for enteral nutrition. TPN previously dc'd however, MD wanting to continue until eating improves.  Megestrol added. CT scan ordered to  r/o post-op infection but pt refused.  Prealbumin trending back up. Endo: hx hypothyroidism, TSH 1.6 on admit, continued on home Synthroid . No history of DM, no A1C on file. CBGs low-nl since starting TPN Lytes: none in TPN - 11/2 labs: low Na/CL, CoCa elevated at 11.86 Renal: ESRD on HD MWF with extra HD on 10/24 due to TPN. Renal started CRRT 10/21 PM, then stopped 10/23. Pt refusing dialysis and may proceed with comfort care towards end of week. Renal ok'd increasing TPN rate to 62ml/hr. Cards: CAD s/p CABG in 2008, HF with EF 25-30%, afib - VSS Hepatobil: LFTs remain WNL except alk phos. Albumin low at 1.8. Baseline TG 199, now 100 - hgb improving, thrombocytopenia resolved, no overt bleeding ID: Zosyn D#15 for bowel perf. Blood and urine cultures NGTD, trach aspirate with few Candida - Afebrile, WBC 12.5 Pulm: increased to 3L [place] - [place], Pulmicort, Spiriva Best Practices: SCDs, MC TPN Access: CVC double lumen left IJ placed 10/20 TPN day#: 14 (10/21 >> )  Current Nutrition:  TPN Ensure (received 3 yesterday) Dysphagia diet - ate 5% of one meal yesterday  Nutritional Goals:  1500-1700 kCal, 75-85 grams of protein per day   Plan: - Continue Clinimix 5/15 (NO ELECTROLYTES) at 50 ml/hr (max per Renal) and IVFE at 10 ml/hr on Mondays only.  Concern that Ensure alone will not meet patient's fatty acids need.  TPN provides a daily average per week of 914  kCal and 60gm protein, meeting 61% of minimum kCal and 80% of minimal protein needs.  Unable to concentrate TPN since they are pre-mixed so we are unable to meet nutritional needs due to fluid limitations. - No multivitamin nor trace elements in TPN as patient is on PO multivitamin - F/U long-term nutrition plan - F/U AM labs    Festus Pursel D. Mina Marble, PharmD, BCPS Pager:  [phone] 04/06/2014, 9:07 AM

## 2014-04-07 DIAGNOSIS — J449 Chronic obstructive pulmonary disease, unspecified: Secondary | ICD-10-CM | POA: Insufficient documentation

## 2014-04-07 DIAGNOSIS — J9601 Acute respiratory failure with hypoxia: Secondary | ICD-10-CM | POA: Insufficient documentation

## 2014-04-07 LAB — COMPREHENSIVE METABOLIC PANEL
ALT: 20 U/L (ref 0–35)
AST: 23 U/L (ref 0–37)
Albumin: 1.8 g/dL — ABNORMAL LOW (ref 3.5–5.2)
Alkaline Phosphatase: 151 U/L — ABNORMAL HIGH (ref 39–117)
Anion gap: 23 — ABNORMAL HIGH (ref 5–15)
BILIRUBIN TOTAL: 0.2 mg/dL — AB (ref 0.3–1.2)
BUN: 115 mg/dL — ABNORMAL HIGH (ref 6–23)
CHLORIDE: 89 meq/L — AB (ref 96–112)
CO2: 15 meq/L — AB (ref 19–32)
CREATININE: 5.86 mg/dL — AB (ref 0.50–1.10)
Calcium: 10.3 mg/dL (ref 8.4–10.5)
GFR calc Af Amer: 7 mL/min — ABNORMAL LOW (ref >90)
GFR calc non Af Amer: 6 mL/min — ABNORMAL LOW (ref >90)
Glucose, Bld: 97 mg/dL (ref 70–99)
POTASSIUM: 5.2 meq/L (ref 3.7–5.3)
SODIUM: 127 meq/L — AB (ref 137–147)
Total Protein: 5.4 g/dL — ABNORMAL LOW (ref 6.0–8.3)

## 2014-04-07 LAB — PHOSPHORUS: Phosphorus: 6 mg/dL — ABNORMAL HIGH (ref 2.3–4.6)

## 2014-04-07 LAB — MAGNESIUM: Magnesium: 2 mg/dL (ref 1.5–2.5)

## 2014-04-07 MED ORDER — PENTAFLUOROPROP-TETRAFLUOROETH EX AERO: 1.0000 "application " | INHALATION_SPRAY | CUTANEOUS | Status: DC | PRN

## 2014-04-07 MED ORDER — SODIUM CHLORIDE 0.9 % IV SOLN: 100.0000 mL | INTRAVENOUS | Status: DC | PRN

## 2014-04-07 MED ORDER — LIDOCAINE HCL (PF) 1 % IJ SOLN: 5.0000 mL | INTRAMUSCULAR | Status: DC | PRN

## 2014-04-07 MED ORDER — HEPARIN SODIUM (PORCINE) 1000 UNIT/ML DIALYSIS
1000.0000 [IU] | INTRAMUSCULAR | Status: DC | PRN
  Filled 2014-04-07: qty 1

## 2014-04-07 MED ORDER — DIAZEPAM 5 MG/ML IJ SOLN
5.0000 mg | INTRAMUSCULAR | Status: DC | PRN
  Administered 2014-04-07: 5 mg via INTRAVENOUS
  Filled 2014-04-07: qty 2

## 2014-04-07 MED ORDER — NEPRO/CARBSTEADY PO LIQD
237.0000 mL | ORAL | Status: DC | PRN
  Filled 2014-04-07: qty 237

## 2014-04-07 MED ORDER — LIDOCAINE-PRILOCAINE 2.5-2.5 % EX CREA
1.0000 | TOPICAL_CREAM | CUTANEOUS | Status: DC | PRN
Start: 2014-04-07 — End: 2014-04-08
  Filled 2014-04-07: qty 5

## 2014-04-07 MED ORDER — HYDROMORPHONE HCL 1 MG/ML IJ SOLN
1.0000 mg | INTRAMUSCULAR | Status: DC | PRN
  Administered 2014-04-07 (×2): 1 mg via INTRAVENOUS
  Filled 2014-04-07 (×2): qty 1

## 2014-04-07 MED ORDER — LIDOCAINE-PRILOCAINE 2.5-2.5 % EX CREA
1.0000 "application " | TOPICAL_CREAM | CUTANEOUS | Status: DC | PRN
  Filled 2014-04-07: qty 5

## 2014-04-07 MED ORDER — ALTEPLASE 2 MG IJ SOLR
2.0000 mg | Freq: Once | INTRAMUSCULAR | Status: DC | PRN
  Filled 2014-04-07: qty 2

## 2014-04-08 NOTE — Discharge Summary (Signed)
Death Summary  Jacalyn LefevreVera M Goetting [id number] DOB: 01-06-1933 DOA: 08-Jul-2013  PCP: Galvin ProfferHAGUE, IMRAN P, MD PCP/Office notified:   Admit date: 08-Jul-2013 Date of Death: 04/03/2014  Final Diagnoses:  Principal Problem:   Pneumoperitoneum Active Problems:   Coronary artery disease   Hypertension   Hyperlipidemia   COPD (chronic obstructive pulmonary disease)   Anxiety and depression   ESRD (end stage renal disease) on dialysis   Hypothyroidism   AICD (automatic cardioverter/defibrillator) present   Hyperkalemia   Abdominal pain   Bowel perforation   AP (abdominal pain)   End-stage renal disease on hemodialysis   Loss of appetite   Palliative care encounter   Acute respiratory failure with hypoxia   COLD (chronic obstructive lung disease)  Sepsis, Respiratory failure, CHF, ESRD     History of present illness:  80 y.o. female with history of CAD s/p CABG, systolic CHF, COPD, tobacco use, and ESRD who presented with AMS and dyspnea and was found to have a pneumoperitoneum.  Hospitalization is complicated with sepsis, septic shock, pneumonia, acute respiratory failure, acute on chronic CHF;  -Pt was DNR, Unfortunately patient progressively declined, and refused to have dialysis and pronounced dead on 04/20/2014 at 20.48 with the family presence at the bedside      Hospital Course:  1. Acute on chronic respiratory failure, post operative VDRF : -coarse breath sounds, patient has history of severe COPD, ongoing tobacco abuse, extubated on 10/23, not completing hemodialysis - chest x-ray 10/29 showed increasing bilateral atelectasis, placed on Zosyn,  - Cont nebulized steroids and BD's, ordered flutter valve and incentive spirometry, O2, deep suctioning  - Needs hemodialysis for volume management, however patient refused HD (see nephrology notes)  2. Severe sepsis and septic shock, secondary to peritonitis  -respiratory culture had shown few Candida albicans otherwise blood cultures,  urine cultures all negative, patient had received Zosyn for 7 days, finished on 10/27.  -zosyn restarted 10/30; mild leukocytosis;  3. Perforated sigmoid colon due to Intestinal ischemia  - Post laparotomy, sigmoid colectomy and liver biopsy, creation of colostomy with abdominal closure  - On TPN, patient needs to start eating slowly, on dysphagia 1 diet however she is not eating -  Pt was TPN but tpn on hold per nephrology due patient fluid overload with HD refusal  - Placed on fentanyl for pain control, palliative medicine following closely  4. ESRD (HD on MWF)  - per Nephrology, discussed with Dr. Marisue HumbleSanford today, difficult situation. As outpatient also, she has frequently terminated her hemodialysis early. Patient had refused hemodialysis on 10/28, went for hemodialysis on 10/29 but stopped early due to pain and cramping. She is volume overloaded. Palliative medicine consult was called, 10/29, per goals of care, patient and family requested to continue with hemodialysis and better pain control. Patient continued to refuse to complete dialysis -continues to refuse dialysis  5. Chronic systolic CHF, ESRD 6. History of atrial fibrillation  7. History of complete heart block with pacemaker  CAD s/p 3v CABG  8. Liver nodules  9. Anemia of CKD  10. Thrombocytopenia  11. Hypothyroidism  12. Severe PCM  - secondary to acute illness with complications outlined above - patient states she is not interested in having an NG tube placed  -not eating well -TPN 13. Acute functional quadriplegia  - secondary to acute illness as noted above    Time: 30 minutes   Signed:  Jonette MateBURIEV, Cristianna Cyr N  [facility] 04/08/2014, 2:42 PM

## 2014-04-14 DIAGNOSIS — R0600 Dyspnea, unspecified: Secondary | ICD-10-CM | POA: Insufficient documentation

## 2014-05-03 NOTE — Progress Notes (Signed)
Patient ZO:XWRU:[name]      DOB: [birth year]      EAV:[id number]   [facility] Progress [facility]    Subjective: Very uncomfortable. She is agitated and calling out. She endorses pain. She keeps saying "I'm ready". She is extremely confused, calling out names in her room. I have called family and left a message.  Filed Vitals:   04/12/2014 0800  BP: 123/56  Pulse: 75  Temp:   Resp: 18   Physical exam: GEN: very confused, moderate distress HEENT: [place], sclera anicteric CV: RRR LUNGS: CTAB ABD: soft, abdominal wound dressed. Very tender EXT: no edema Skin: warm/dry  CBC    Component Value Date/Time   WBC 12.5* 04/04/2014 0500   RBC 2.79* 04/04/2014 0500   HGB 9.2* 04/04/2014 0500   HCT 27.8* 04/04/2014 0500   PLT 257 04/04/2014 0500   MCV 99.6 04/04/2014 0500   MCH 33.0 04/04/2014 0500   MCHC 33.1 04/04/2014 0500   RDW 16.2* 04/04/2014 0500   LYMPHSABS 0.9 04/04/2014 0500   MONOABS 0.5 04/04/2014 0500   EOSABS 0.2 04/04/2014 0500   BASOSABS 0.0 04/04/2014 0500    CMP     Component Value Date/Time   NA 127* 04/06/2014 0400   K 5.2 04/10/2014 0400   CL 89* 04/16/2014 0400   CO2 15* 04/28/2014 0400   GLUCOSE 97 04/20/2014 0400   BUN 115* 04/09/2014 0400   CREATININE 5.86* 04/23/2014 0400   CALCIUM 10.3 04/20/2014 0400   PROT 5.4* 05/02/2014 0400   ALBUMIN 1.8* 04/23/2014 0400   AST 23 04/18/2014 0400   ALT 20 04/14/2014 0400   ALKPHOS 151* 04/27/2014 0400   BILITOT 0.2* 04/16/2014 0400   GFRNONAA 6* 04/12/2014 0400   GFRAA 7* 04/08/2014 0400     Assessment and plan: 80 yo female with multiple medical problems including ESRD, CHF, Afib, heart block who presented with perforated sigmoid colon s/p surgical resection. Stay complicated by sepsis, poor appetite requiring TPN, resp failure. She is HD dependent, currently refusing HD. Yesterday her TPN was stopped due to volume overload issues with no HD.  Today she appears to be much more  uncomfortable- she is having signs of worsening uremia- she is having severe itching, agitation and complains of pain.   1. Code Status- DNR  2. Goals of Care- see previous documentation. After discussion yesterday, it seems pt/family agree on time limited trial of dialysis.  Struggle between patient family.  Patient displays some indifference to dialysis, family frustrated at her compliance and aggressive in trying to support dialysis for her.  They seemed to agree that if she is not able to complete dialysis sessions by end of week despite our best efforts, than switching focus to comfort makes sense.  Our team will continue to follow along. She did fill out HCPOA paperwork yesterday with Chaplain.   3. Symptom Management A. Acute on Chronic Pain- Requiring maximal doses of dilaudid and also receiving fentanyl for acute pain, moderate agitation B. Poor appetite- discontinued megace side effects may affect wound healing and limited benefit  C. Anxiety- changed to diazepam in setting of no HD   I have contacted the family-[name] appears to be transitioning to active dying phase and progressive uremia symptoms. A focus on comfort is the only reasonable medical intervention at this point since HD is no longer an option.  [name], DO Palliative Medicine  Team Phone: [phone]

## 2014-05-03 NOTE — Progress Notes (Signed)
PARENTERAL NUTRITION CONSULT NOTE - FOLLOW UP  Pharmacy Consult:  TPN Indication:  Pneumoperitoneum with bowel resection  Allergies  Allergen Reactions  . Ivp Dye [Iodinated Diagnostic Agents] Swelling  . Lisinopril Swelling  . Iohexol Itching and Swelling        Patient Measurements: Height: _0  (157.5 cm) Weight: 103 lb (46.72 kg) IBW/kg (Calculated) : 50.1  Vital Signs: Temp: 98 F (36.7 C) (11/05 0746) Temp Source: Oral (11/05 0746) BP: 123/56 mmHg (11/05 0800) Pulse Rate: 75 (11/05 0800) Intake/Output from previous day: 11/04 0701 - 11/05 0700 In: 560 [P.O.:410; IV Piggyback:150] Out: 350 [Stool:350]  Labs: No results for input(s): WBC, HGB, HCT, PLT, APTT, INR in the last 72 hours.   Recent Labs  05/01/2014 0400  NA 127*  K 5.2  CL 89*  CO2 15*  GLUCOSE 97  BUN 115*  CREATININE 5.86*  CALCIUM 10.3  MG 2.0  PHOS 6.0*  PROT 5.4*  ALBUMIN 1.8*  AST 23  ALT 20  ALKPHOS 151*  BILITOT 0.2*   Estimated Creatinine Clearance: 5.7 mL/min (by C-G formula based on Cr of 5.86).   No results for input(s): GLUCAP in the last 72 hours.     Insulin Requirements in the past 24 hours:  SSI d/c'd 10/26  Assessment:  81 YOF transferred from [place] with perforated bowel - CT showed free air and thickened small bowel. She was taken to OR 03/13/2014 PM for ex-lap, application of open wound VAC and sigmoid colon resection.  Pharmacy consulted to manage TPN.  GI: hx duodenal ulcers; POD#14 ex-lap, sigmoid colon resection. Went back to OR 10/22 for re-exp, colostomy >> found to have several liver nodules suspicious for malignancy which were biopsied. Diet advanced to dysphagia diet however, no PO intake documented and continued poor appetite. Surgery recommended feeding tube for enteral nutrition. TPN previously dc'd however, MD wanting to continue until eating improves.  Megestrol added. CT scan ordered to r/o post-op infection but pt refused.  Prealbumin trending back  up. Endo: hx hypothyroidism, TSH 1.6 on admit, continued on home Synthroid . No history of DM, no A1C on file. CBGs low-nl since starting TPN Lytes: none in TPN - low Na/CL, K+ 5.2, Phos elevated at 6, CoCa elevated at 12.06, low CO2 Renal: ESRD on HD MWF with extra HD on 10/24 due to TPN. Renal started CRRT 10/21 PM, then stopped 10/23. Pt refusing dialysis and may proceed with comfort care towards end of week - SCr up 5.86, BUN up to 115. Renal ok'd increasing TPN rate to 50 ml/hr. Cards: CAD s/p CABG in 2008, HF with EF 25-30%, afib - VSS Hepatobil: LFTs remain WNL except alk phos. Albumin low at 1.8. Baseline TG 199, now 100 - hgb improving, thrombocytopenia resolved, no overt bleeding ID: Zosyn D#[id number] for bowel perf. Blood and urine cultures NGTD, trach aspirate with few Candida - Afebrile, WBC 12.5 Pulm: increased to 3L [place] - [place], Pulmicort, Spiriva Best Practices: SCDs, MC TPN Access: CVC double lumen left IJ placed 10/20 TPN day#: 15 (10/21 >> )  Current Nutrition:  TPN Ensure (received 3 yesterday) Dysphagia diet - ate 5% of one meal yesterday  Nutritional Goals:  1500-1700 kCal, 75-85 grams of protein per day   Plan: - Hold TPN today per [facility] and will address tomorrow after today's family meeting with palliative care (TPN order was discontinued on 04/06/14 because patient refused HD and would not be able to tolerate TPN's volume). - See PharmD note on 04/06/14  for previous nutritional plans    Marilyn Nihiser D. Mina Marble, PharmD, BCPS Pager:  [phone] May 06, 2014, 9:35 AM

## 2014-05-03 NOTE — Consult Note (Signed)
WOC ostomy follow up Pt calling out family names when I arrive, a bit confused.  No family at bedside at the time of my visit. Tell me "my pain is terrible"  She reports its a 10.  Hurts when I touch even her toes.  Family leaning towards comfort care.  Stoma type/location: LLQ, end colostomy Stomal assessment/size: 2 1/4" round, budded, pink, and moist Peristomal assessment: itnact Treatment options for stomal/peristomal skin: none needed Output liquid brown stool in pouch Ostomy pouching: 1pc drainable pouch Education provided: none, patient is not able to learn care and no family in the room. Her prognosis appears poor.  WOC will follow along as needed for support with ostomy care  Daizha Anand [facility] RN, CWOCN [phone]

## 2014-05-03 NOTE — Progress Notes (Signed)
See that the patient has been made comfort care only given her transitioning to active dying phase and progressive uremia symptoms.  Palliative care team has been transitioning her to comfort care.  We will sign off.  Dressing changes can be discontinued if desired.  Call with questions/concerns.  Aris GeorgiaMegan Dort, PA-C General Surgery [facility]

## 2014-05-03 NOTE — Progress Notes (Signed)
   04/20/2014 1137  Clinical Encounter Type  Visited With Patient and family together;Health care provider  Visit Type Follow-up;Spiritual support  Stress Factors  Family Stress Factors Loss;Health changes    Chaplain stopped in to check on family. Pt's granddaughter, Tresa EndoKelly, at bedside. Pt asking for food so chaplain notified nurse. Tresa EndoKelly said "My mom just told the news" and was tearful, said she would return later after work. Nurse said more family may arrive this afternoon. Chaplain will try to check in again this pm to offer support.  Wille GlaserMcCray, Kemarion Abbey O, Chaplain 04/24/2014 11:39 AM

## 2014-05-03 NOTE — Progress Notes (Signed)
Called into pt room at 2045 by pt family members endorsing she had stopped breathing. Pt was agonal breathing with no radial pulse and faint heartbeat. EKG rhythym was V-paced. @2048  pt appeared to stop breathing completely. Pt pronounced dead by 2 RNs (MillvilleMatt Adalyn Pennock, RN and Isidor HoltsLee Worley, RN) @2049  by auscultation of no heartbeat for 1 minute. Empathy provided to pt family and friends at bedside. Attending on-call, Kirtland BouchardK. Schorr paged along with Chaplain and [facility]. Pt not a candidate for any donations.  @2250 , This RN escorted pt's body with [facility] personnel to morgue and security checked body out.

## 2014-05-03 NOTE — Progress Notes (Signed)
[facility] PROGRESS NOTE  [name] ZOX:[id number] DOB: Mar 03, 1933 DOA: 03/31/2014 PCP: Galvin ProfferHAGUE, IMRAN P, MD  Assessment/Plan: 80 y.o. female with history of CAD s/p CABG, systolic CHF, COPD, tobacco use, and ESRD who presented with AMS and dyspnea and was found to have a pneumoperitoneum.   1. Acute on chronic respiratory failure, post operative VDRF : -coarse breath sounds, patient has history of severe COPD, ongoing tobacco abuse, extubated on 10/23, not completing hemodialysis - chest x-ray 10/29 showed increasing bilateral atelectasis, placed on Zosyn,  - Cont nebulized steroids and BD's, ordered flutter valve and incentive spirometry, O2, deep suctioning  - Needs hemodialysis for volume management, however patient has not been completing or refusing (see nephrology notes)  2. Severe sepsis and septic shock, secondary to peritonitis  - improved, respiratory culture had shown few Candida albicans otherwise blood cultures, urine cultures all negative, patient had received Zosyn for 7 days, finished on 10/27.  -zosyn restarted 10/30; mild leukocytosis; will d/c atx if afebrile    3. Perforated sigmoid colon due to Intestinal ischemia  - Post laparotomy, sigmoid colectomy and liver biopsy, creation of colostomy with abdominal closure  - On TPN, patient needs to start eating slowly, on dysphagia 1 diet however she is not eating - Needs PT/OT, increase ambulation - Pt was TPN but tpn on hold per nephrology due patient fluid overload with HD refusal  - Placed on fentanyl for pain control, palliative medicine following closely  4. ESRD (HD on MWF)  - per Nephrology, discussed with Dr. Marisue HumbleSanford today, difficult situation. As outpatient also, she has frequently terminated her hemodialysis early. Patient had refused hemodialysis on 10/28, went for hemodialysis on 10/29 but stopped early due to pain and cramping. She is volume overloaded. Palliative medicine consult was called, 10/29,  per goals of care, patient and family requested to continue with hemodialysis and better pain control. Patient continues to refuse to complete dialysis -continues to refuse dialysis  5. Chronic systolic CHF, ESRD - HD for volume control   6. History of atrial fibrillation  - rate controlled, in NSR   7. History of complete heart block with pacemaker  CAD s/p 3v CABG  -No chest pain   8. Liver nodules  - biopsy results indicate benign process  - colon pathology negative for malignancy   9. Anemia of CKD  - no signs of active bleeding  - follow CBC   10. Thrombocytopenia  - SCD's for DVT prophylaxis   11. Hypothyroidism  - Continue home Synthroid dose   12. Severe PCM  - secondary to acute illness with complications outlined above - patient states she is not interested in having an NG tube placed  -not eating well -TPN  13. Acute functional quadriplegia  - secondary to acute illness as noted above  - PT/OT evaluation pending   Prognosis is poor, d/w palliative care who is talking to family possible needs comfort care    Code Status: DNR Family Communication: d/w patient, palliative care; no family at the bedside; will try to contact family later  (indicate person spoken with, relationship, and if by phone, the number) Disposition Plan: pend clinical improvement   Significant Events:  10/20 Ex lap and removal of necrotic bowel; on neo  10/21 On levophed and neo  10/22 Back to OR, fascial closure. Finding of multiple liver nodules not previously noted. Liver biopsy obtained- benign 10/22 Family conference. Pt made DNR in event of cardiac arrest.  10/23 Off vasopressors, extubated with  adequate resp status post extubaiton  10/24 Stable for tx to SDU  10/25 [facility] assumed care  Since the hospitalist have assume the patient's care the primary focus has been advancement of her diet/nutritional status. Patient has been refusing full dialysis, refusing to eat,  refusing CT scans   Consultants:  Nephrology  Gen. Surgery  Ccm   Palliative care  Procedures: Ex lap and removal of necrotic bowel   Antibiotics:  zosyn    HPI/Subjective: alert  Objective: Filed Vitals:   2013/07/19 0800  BP: 123/56  Pulse: 75  Temp:   Resp: 18    Intake/Output Summary (Last 24 hours) at 2013/07/19 0943 Last data filed at 2013/07/19 0805  Gross per 24 hour  Intake    620 ml  Output    350 ml  Net    270 ml   Filed Weights   04/06/14 0428 04/06/14 0500 2013/07/19 0500  Weight: 45.36 kg (100 lb) 45.36 kg (100 lb) 46.72 kg (103 lb)    Exam:   General:  alert  Cardiovascular: s1,s2 rrr  Respiratory: BL crackles   Abdomen: soft, +colostomy   Musculoskeletal: LE edema   Data Reviewed: Basic Metabolic Panel:  Recent Labs Lab 04/01/14 0319 04/02/14 0415 04/03/14 0148 04/04/14 0500 2013/07/19 0400  NA 137 136* 132* 130* 127*  K 3.3* 4.6 3.7 4.0 5.2  CL 96 97 95* 92* 89*  CO2 27 23 23 21  15*  GLUCOSE 114* 101* 106* 121* 97  BUN 45* 62* 47* 70* 115*  CREATININE 3.19* 4.13* 3.26* 4.15* 5.86*  CALCIUM 8.8 9.4 8.9 10.1 10.3  MG  --   --   --  1.7 2.0  PHOS  --   --   --  3.4 6.0*   Liver Function Tests:  Recent Labs Lab 04/04/14 0500 2013/07/19 0400  AST 19 23  ALT 15 20  ALKPHOS 138* 151*  BILITOT 0.3 0.2*  PROT 5.4* 5.4*  ALBUMIN 1.8* 1.8*   No results for input(s): LIPASE, AMYLASE in the last 168 hours. No results for input(s): AMMONIA in the last 168 hours. CBC:  Recent Labs Lab 04/01/14 0319 04/03/14 0149 04/04/14 0500  WBC 12.8* 12.5* 12.5*  NEUTROABS  --   --  10.8*  HGB 9.3* 8.6* 9.2*  HCT 28.2* 26.6* 27.8*  MCV 100.7* 101.5* 99.6  PLT 135* 171 257   Cardiac Enzymes: No results for input(s): CKTOTAL, CKMB, CKMBINDEX, TROPONINI in the last 168 hours. BNP (last 3 results) No results for input(s): PROBNP in the last 8760 hours. CBG: No results for input(s): GLUCAP in the last 168 hours.  No results  found for this or any previous visit (from the past 240 hour(s)).   Studies: No results found.  Scheduled Meds: . antiseptic oral rinse  7 mL Mouth Rinse BID  . arformoterol  15 mcg Nebulization BID  . budesonide (PULMICORT) nebulizer solution  0.5 mg Nebulization BID  . feeding supplement (ENSURE)  1 Container Oral TID BM  . [START ON 04/08/2014] fentaNYL  25 mcg Transdermal Q72H  . levothyroxine  150 mcg Oral QAC breakfast  . mirtazapine  7.5 mg Oral QHS  . olopatadine  1 drop Both Eyes BID  . ondansetron (ZOFRAN) IV  4 mg Intravenous 3 times per day  . oxymetazoline  1 spray Each Nare BID  . pantoprazole (PROTONIX) IV  40 mg Intravenous Q24H  . piperacillin-tazobactam (ZOSYN)  IV  2.25 g Intravenous Q8H  . tiotropium  18 mcg Inhalation  Daily   Continuous Infusions: . sodium chloride 10 mL/hr at 04/01/14 0700    Principal Problem:   Pneumoperitoneum Active Problems:   Coronary artery disease   Hypertension   Hyperlipidemia   COPD (chronic obstructive pulmonary disease)   Anxiety and depression   ESRD (end stage renal disease) on dialysis   Hypothyroidism   AICD (automatic cardioverter/defibrillator) present   Hyperkalemia   Abdominal pain   Bowel perforation   AP (abdominal pain)   End-stage renal disease on hemodialysis   Loss of appetite   Palliative care encounter    Time spent: >35 minutes     Esperanza Sheets  [facility] Pager [phone]. If 7PM-7AM, please contact night-coverage at [email or web address], password [facility] 04/30/2014, 9:43 AM  LOS: 16 days

## 2014-05-03 NOTE — Progress Notes (Signed)
SLP Cancellation Note  Patient Details Name: Jacalyn LefevreVera M [place]burg MRN: [id number] DOB: 28-Apr-1933   Cancelled treatment:       Reason Eval/Treat Not Completed: Medical issues which prohibited therapy (Palliative care team transitioning pt to comfort care)  Ferdinand LangoLeah Giana Castner MA, CCC-SLP [phone]  Ferdinand LangoMcCoy Quintez Maselli Meryl 05/01/2014, 10:47 AM

## 2014-05-03 NOTE — Progress Notes (Signed)
Utilization review completed.  

## 2014-05-03 NOTE — Progress Notes (Signed)
  [facility] Progress Note   Subjective: confused now as expected  Filed Vitals:   February 06, 2014 0500 February 06, 2014 0741 February 06, 2014 0746 February 06, 2014 0800  BP:    123/56  Pulse:    75  Temp:   98 F (36.7 C)   TempSrc:   Oral   Resp:    18  Height:      Weight: 46.72 kg (103 lb)     SpO2:  100% 100% 100%   Exam: Gen: frail, ill weak emaciated, rattling cough Heart: RRR Lungs: bilat coarser basilar rales Abdomen: colostomy green stool, midline dressing clean Ext: bilat UE edema 1-2+ Access: left upper AVF + bruit  HD: MWF [facility]  3.5h 44.5kg 2/2.25 Bath 350/800 Heparin none Profile 2  No meds      Assessment: 1 Ruptured sigmoid colon, s/p partial colectomy/colostomy - on Zosyn, afeb 2 ESRD on HD - refused dialysis Monday and Wed this week 3 Anemia on aranesp 25 ug /wk started here 4 HPTH no meds, labs in range 5 HTN / volume - BP's normal, at dry wt, on TNA 6 Nutrition continues to require TNA 7 Severe debility  Plan- pt is now agitated and not a candidate for dialysis at this point.  She had been refusing HD repeatedly, she is in a debilitated state from multiple acute and chronic medical conditions and it is highly unlikely that further aggressive treatment (i.e., dialysis, abx, etc..) will turn things around for her.  Would focus on comfort care at this time.  No further dialysis.  Have d/w Dr Phillips OdorGolding and staff.    Vinson Moselleob Klynn Linnemann MD  pager [phone]    cell [phone]  04/19/2014, 10:27 AM     Recent Labs Lab 04/03/14 0148 04/04/14 0500 February 06, 2014 0400  NA 132* 130* 127*  K 3.7 4.0 5.2  CL 95* 92* 89*  CO2 23 21 15*  GLUCOSE 106* 121* 97  BUN 47* 70* 115*  CREATININE 3.26* 4.15* 5.86*  CALCIUM 8.9 10.1 10.3  PHOS  --  3.4 6.0*    Recent Labs Lab 04/04/14 0500 February 06, 2014 0400  AST 19 23  ALT 15 20  ALKPHOS 138* 151*  BILITOT 0.3 0.2*  PROT 5.4* 5.4*  ALBUMIN 1.8* 1.8*    Recent Labs Lab 04/01/14 0319 04/03/14 0149 04/04/14 0500  WBC 12.8*  12.5* 12.5*  NEUTROABS  --   --  10.8*  HGB 9.3* 8.6* 9.2*  HCT 28.2* 26.6* 27.8*  MCV 100.7* 101.5* 99.6  PLT 135* 171 257   . antiseptic oral rinse  7 mL Mouth Rinse BID  . arformoterol  15 mcg Nebulization BID  . budesonide (PULMICORT) nebulizer solution  0.5 mg Nebulization BID  . feeding supplement (ENSURE)  1 Container Oral TID BM  . [START ON 04/08/2014] fentaNYL  25 mcg Transdermal Q72H  . levothyroxine  150 mcg Oral QAC breakfast  . mirtazapine  7.5 mg Oral QHS  . olopatadine  1 drop Both Eyes BID  . ondansetron (ZOFRAN) IV  4 mg Intravenous 3 times per day  . pantoprazole (PROTONIX) IV  40 mg Intravenous Q24H  . piperacillin-tazobactam (ZOSYN)  IV  2.25 g Intravenous Q8H  . tiotropium  18 mcg Inhalation Daily   . sodium chloride 10 mL/hr at 04/01/14 0700   albuterol, diazepam, fentaNYL, HYDROmorphone (DILAUDID) injection, RESOURCE THICKENUP CLEAR, sodium chloride, witch hazel-glycerin

## 2014-05-03 NOTE — Progress Notes (Signed)
Chaplain paged when the patient passed. Large number of family bedside consoling one another. Grieving is fresh and being facilitated by family members. Gave the family privacy as they grieve. Noted that Chaplain was available if needed.    Will follow as needed.   Page if needed.   Gala RomneyBrown, Harshika Mago J, Chaplain 05/02/2014

## 2014-05-03 DEATH — deceased

## 2014-05-12 ENCOUNTER — Encounter (HOSPITAL_COMMUNITY): Payer: Self-pay | Admitting: Vascular Surgery

## 2015-03-27 IMAGING — CR DG CHEST 1V PORT
1 series · 1 of 1 positions shown · non-contrast
Comparison: 11/23/2013

CLINICAL DATA: Hypoxemia

EXAM:
PORTABLE CHEST - 1 VIEW

[AP]
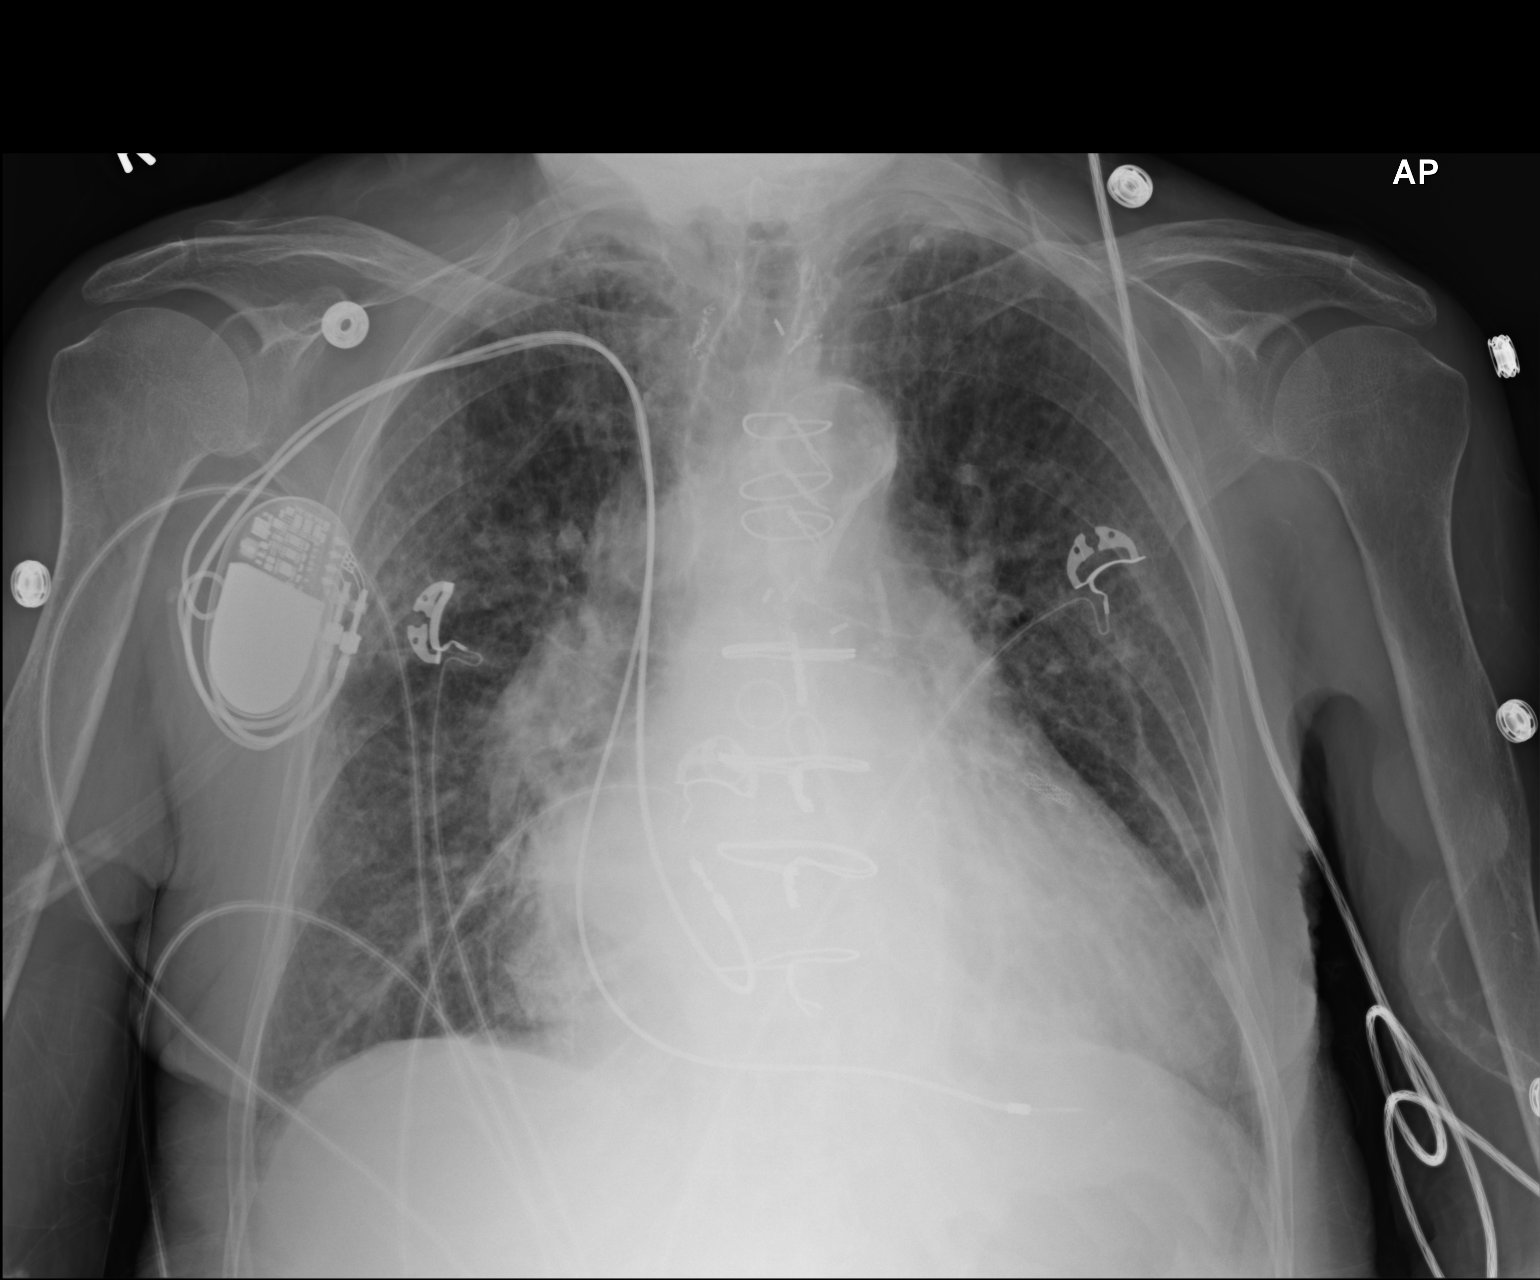

[1 of 1 positions shown; findings below may reference images not displayed]

FINDINGS: Diffuse mild bilateral interstitial thickening. No pleural effusion
or pneumothorax. Enlargement of the central pulmonary vasculature.
Stable cardiomegaly. Prior CABG. Dual lead AICD. Unremarkable
osseous structures.

The osseous structures are unremarkable.
IMPRESSION: Mild pulmonary venous congestion.

## 2015-07-23 IMAGING — CR DG CHEST 1V PORT
1 series · 1 of 1 positions shown · non-contrast
Comparison: [DATE]/2482 8586 hrs

CLINICAL DATA: Check central line placement

EXAM:
PORTABLE CHEST - 1 VIEW

[AP]
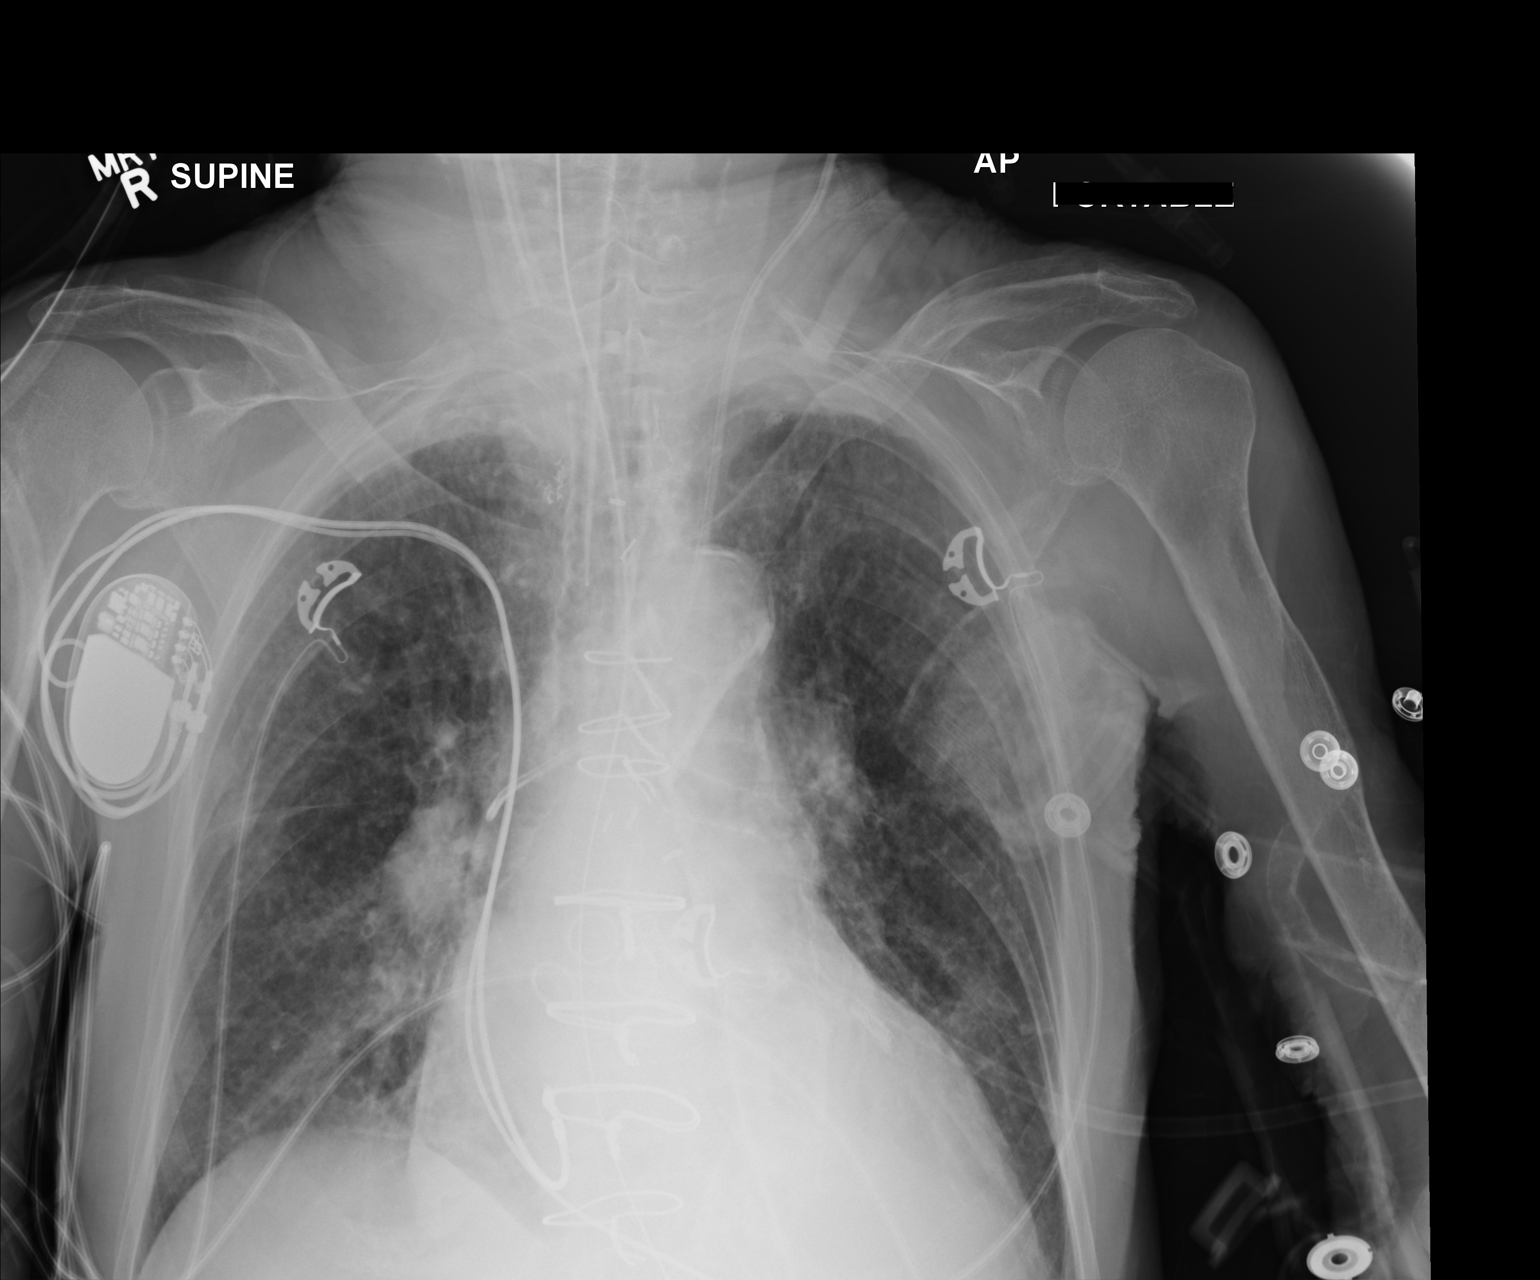

[1 of 1 positions shown; findings below may reference images not displayed]

FINDINGS: Cardiac shadow is stable. Postsurgical changes are again seen. A
pacing device is again noted and stable. The endotracheal tube is
noted 4.4 cm above the carina. A nasogastric catheter courses
towards the stomach. The left central venous line has been advanced
and now lies with the tip in the proximal superior vena cava. No
pneumothorax is noted. Bibasilar infiltrates are again identified.
IMPRESSION: Tubes and lines as described.  Stable bibasilar changes.

## 2015-07-23 IMAGING — CR DG CHEST 1V PORT
1 series · 1 of 1 positions shown · non-contrast
Comparison: 03/23/2014 at [DATE] a.m.

CLINICAL DATA: Left central line placement.

EXAM:
PORTABLE CHEST - 1 VIEW

[AP]
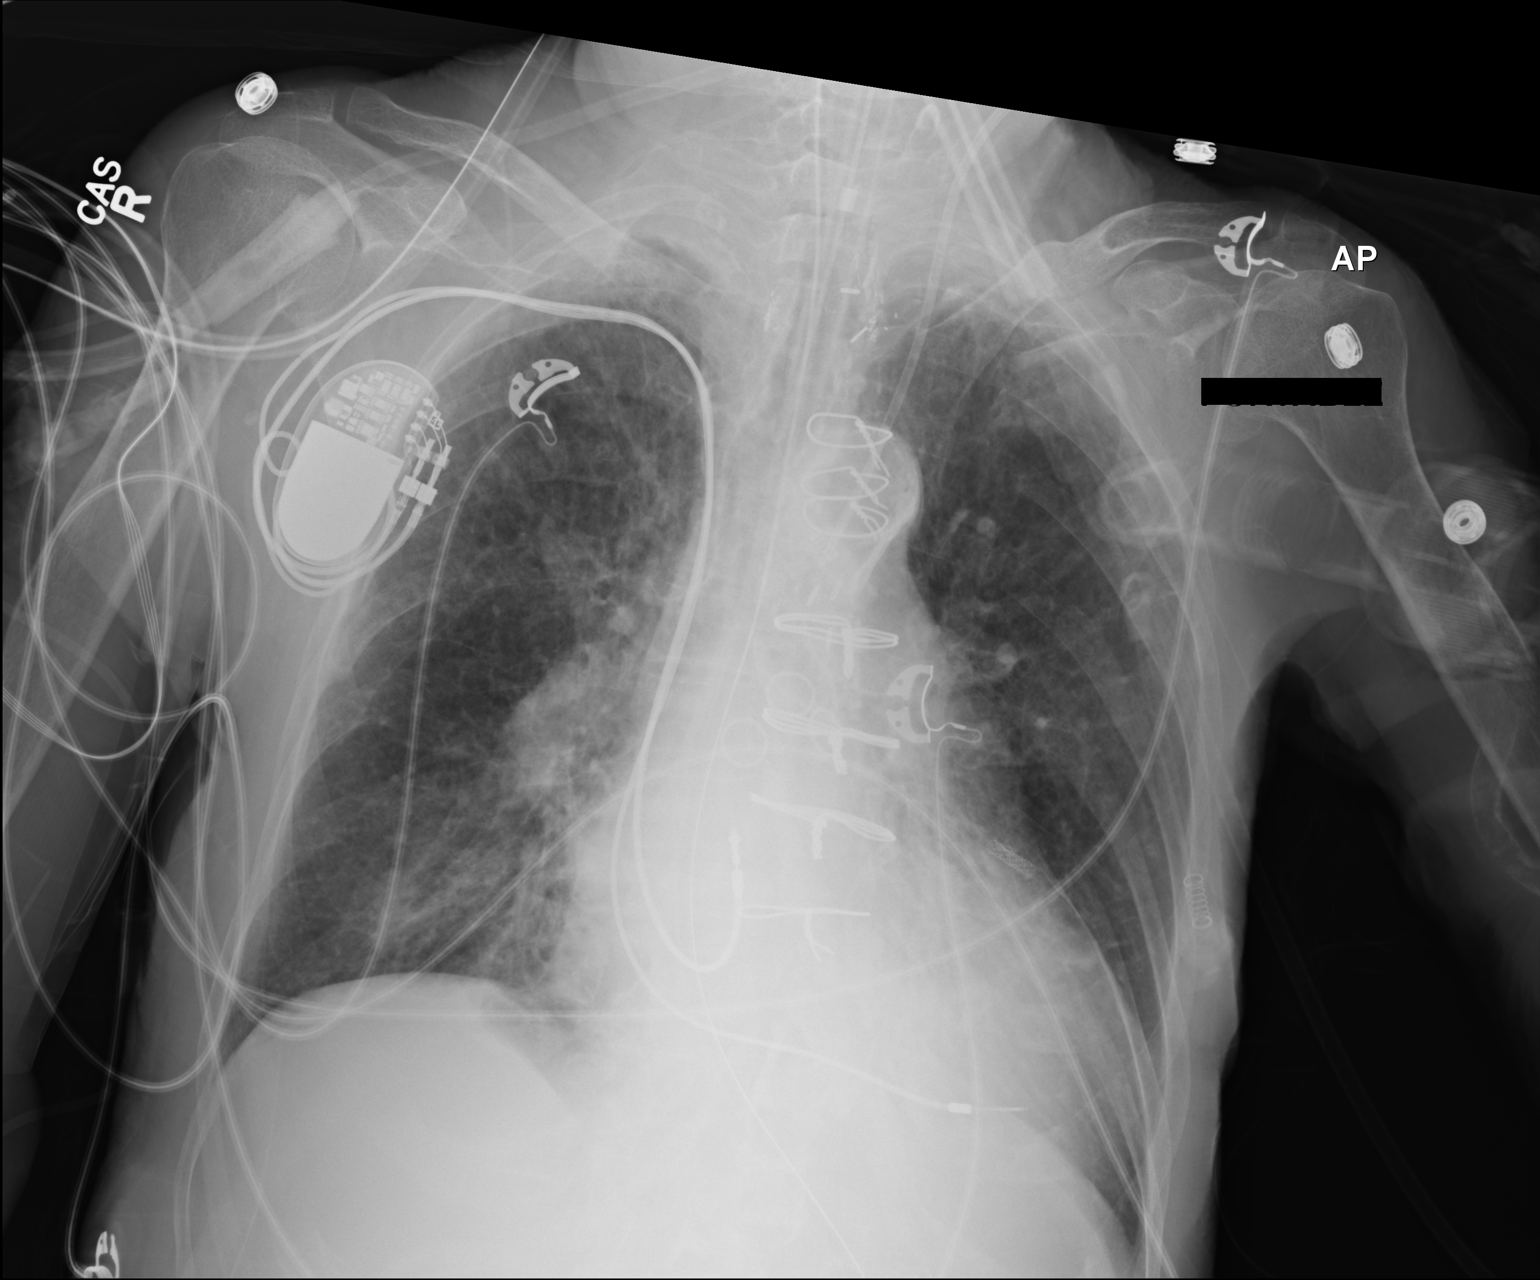

[1 of 1 positions shown; findings below may reference images not displayed]

FINDINGS: Endotracheal tube tip is indistinct but believed to be about 4.4 cm
above the carina. Nasogastric tube enters the stomach. Left IJ line
appears to of retracted slightly and now projects over the bottom of
the aortic arch. This is probably in the innominate vein.

Prior CABG. Indistinct pulmonary vasculature. Stable low level
airspace opacity in the right infrahilar region and retrocardiac
region. coronary stent.
IMPRESSION: 1. Left IJ line has retracted somewhat, with tip projecting over the
lower margin of the thoracic aorta, but likely in a venous structure
given prior positioning. One could perform lateral view or chest CT
to confirm line positioning.
2. Stable mild bibasilar airspace opacities. Interstitial
accentuation in the upper lung zones may reflect mild pulmonary
venous hypertension.

## 2015-07-23 IMAGING — CR DG ABD PORTABLE 1V
1 series · 1 of 1 positions shown · non-contrast
Comparison: CT abdomen pelvis of 03/22/2014

CLINICAL DATA: Encounter for nasogastric tube placement

EXAM:
PORTABLE ABDOMEN - 1 VIEW

[AP]
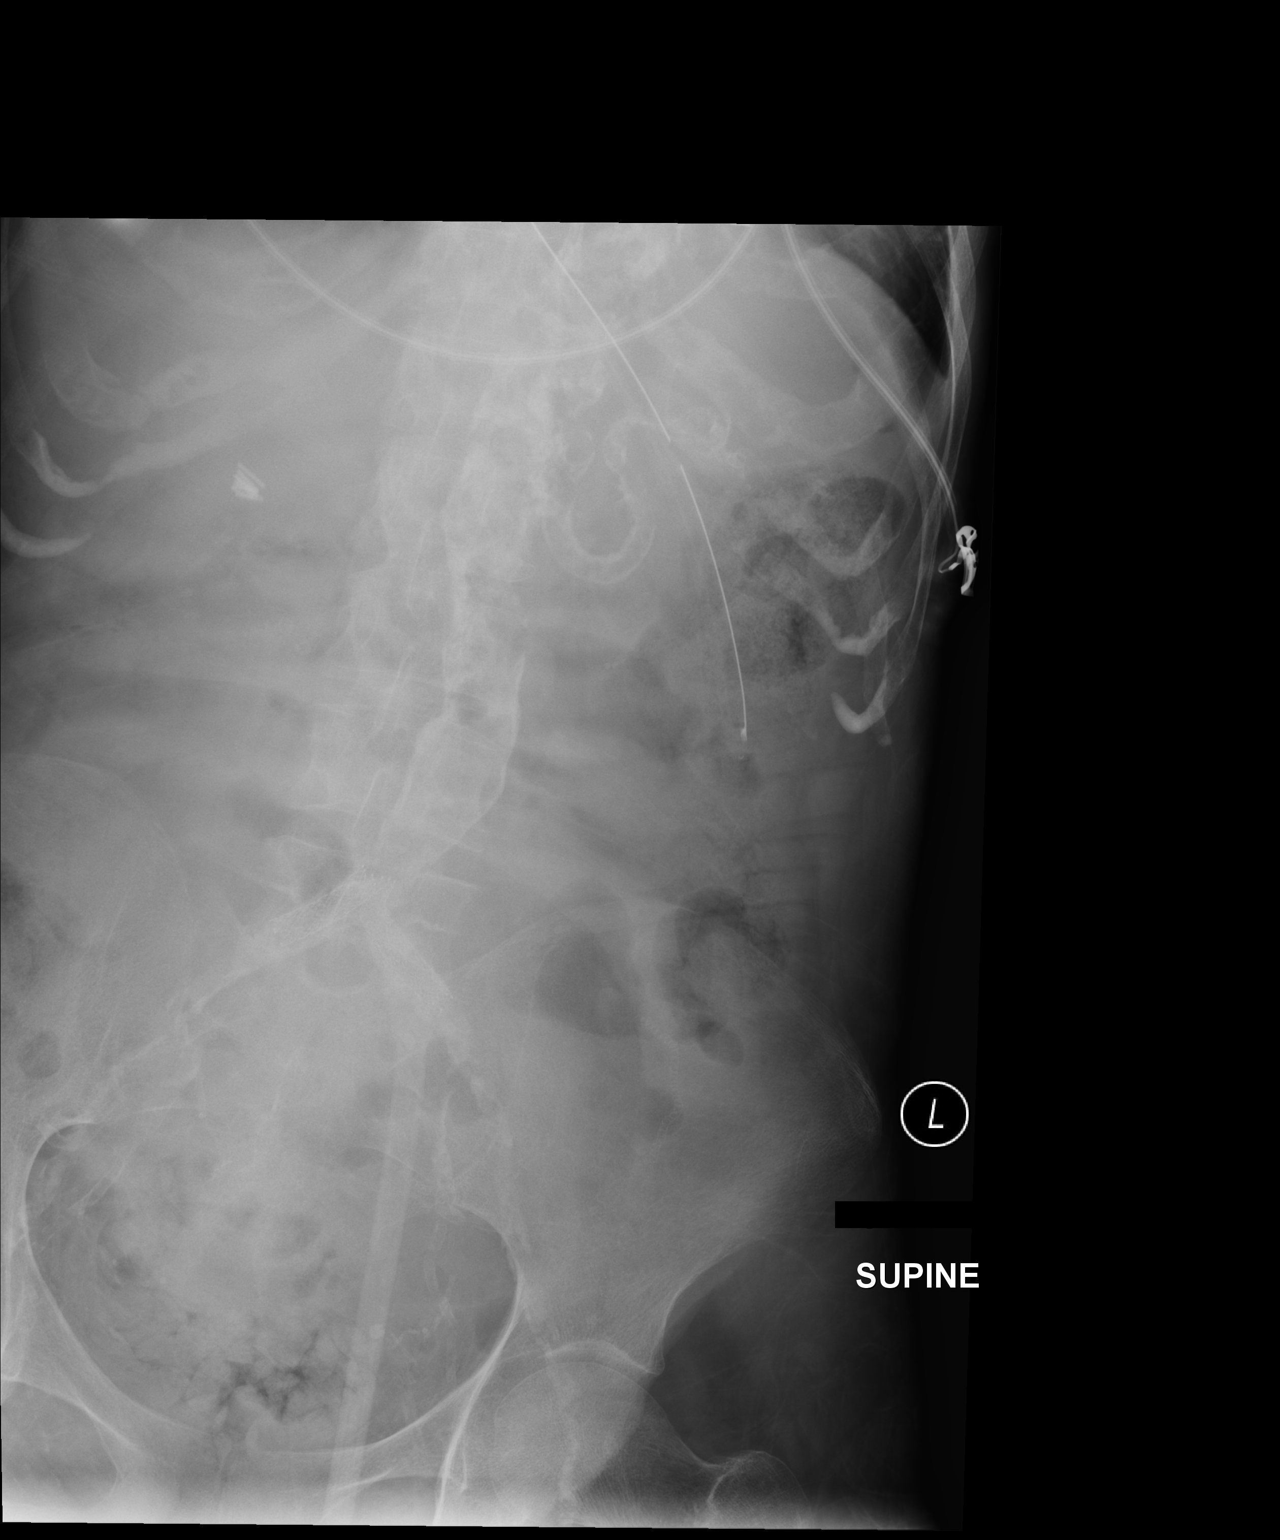

[1 of 1 positions shown; findings below may reference images not displayed]

FINDINGS: The tip of the feeding tube is below the hemidiaphragm probably
overlying the mid portion of the stomach. The bowel gas pattern is
nonspecific. Considerable arterial calcification is present
throughout the abdominal aorta and iliac arteries.
IMPRESSION: Tip of NG tube overlies the region of the body of the stomach.

## 2015-07-26 IMAGING — CR DG ABD PORTABLE 1V
1 series · 1 of 1 positions shown · non-contrast
Comparison: 03/23/2014

CLINICAL DATA: NG tube placement.

EXAM:
PORTABLE ABDOMEN - 1 VIEW

[AP]
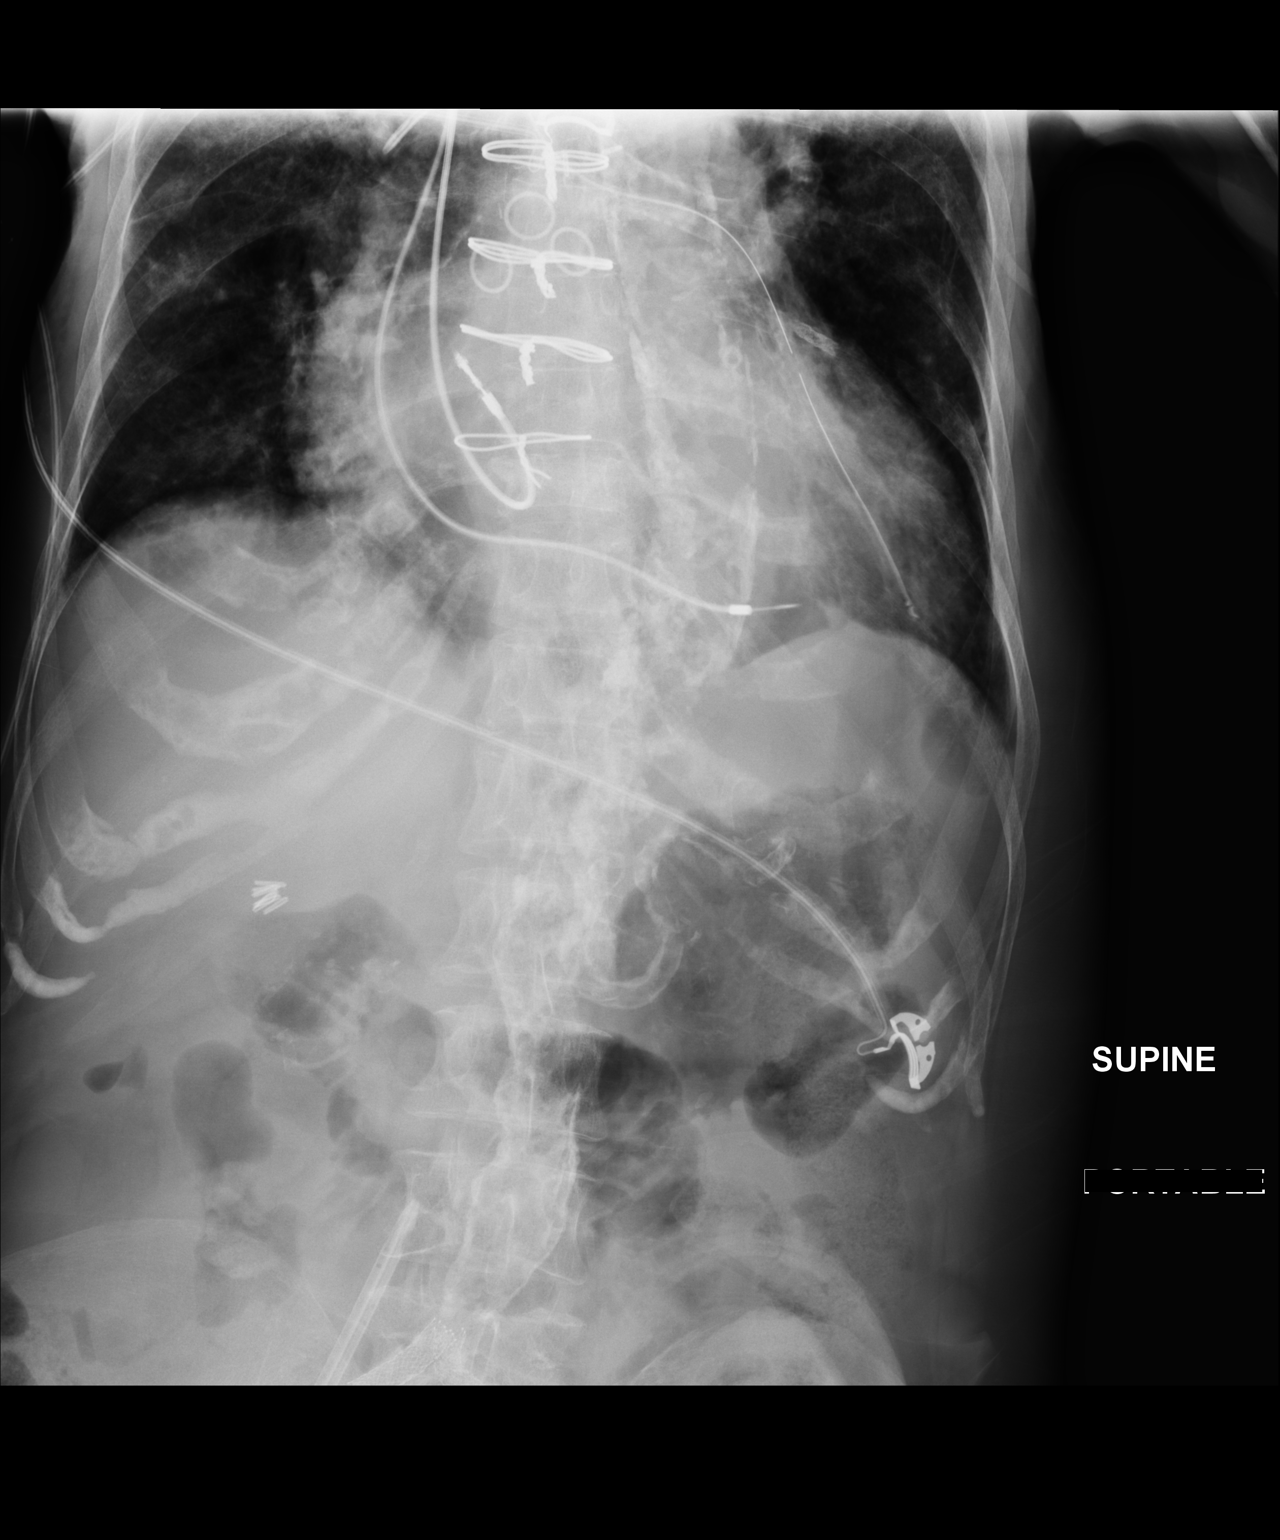

[1 of 1 positions shown; findings below may reference images not displayed]

FINDINGS: NG tube is seen, entering the left lung with the tip at the left
lung base. Recommend complete removal and replacement.

Nonobstructive bowel gas pattern. Heavily calcified aorta. The right
growing central line is in place with the tip in the lower abdomen.
IMPRESSION: NG tube in the left lung with the tip in the left lung base.

Critical Value/emergent results were called by telephone at the time
of interpretation on 03/26/2014 at [DATE] to patient's nurse Jaworski
Nya who verbally acknowledged these results.
